# Patient Record
Sex: Female | Born: 1940 | Race: Black or African American | Hispanic: No | Marital: Married | Smoking: Never smoker
Health system: Southern US, Community
[De-identification: ages and names within clinical notes are randomized; demographics above are authoritative.]

## PROBLEM LIST (undated history)

## (undated) DIAGNOSIS — C259 Malignant neoplasm of pancreas, unspecified: Principal | ICD-10-CM

## (undated) DIAGNOSIS — K831 Obstruction of bile duct: Principal | ICD-10-CM

## (undated) DIAGNOSIS — C250 Malignant neoplasm of head of pancreas: Principal | ICD-10-CM

## (undated) DIAGNOSIS — Z01812 Encounter for preprocedural laboratory examination: Secondary | ICD-10-CM

## (undated) DIAGNOSIS — E119 Type 2 diabetes mellitus without complications: Secondary | ICD-10-CM

## (undated) DIAGNOSIS — T7840XA Allergy, unspecified, initial encounter: Secondary | ICD-10-CM

## (undated) DIAGNOSIS — H269 Unspecified cataract: Secondary | ICD-10-CM

## (undated) DIAGNOSIS — I10 Essential (primary) hypertension: Secondary | ICD-10-CM

## (undated) HISTORY — DX: Unspecified cataract: H26.9

## (undated) HISTORY — DX: Allergy, unspecified, initial encounter: T78.40XA

## (undated) HISTORY — DX: Type 2 diabetes mellitus without complications: E11.9

## (undated) HISTORY — PX: CHOLECYSTECTOMY: SHX55

## (undated) HISTORY — PX: SPINE SURGERY: SHX786

## (undated) HISTORY — DX: Essential (primary) hypertension: I10

## (undated) HISTORY — PX: ABDOMINAL HYSTERECTOMY: SHX81

---

## 1998-07-24 ENCOUNTER — Inpatient Hospital Stay (HOSPITAL_COMMUNITY): Admission: EM | Admit: 1998-07-24 | Discharge: 1998-07-26 | Payer: Self-pay | Admitting: Internal Medicine

## 1999-06-26 ENCOUNTER — Other Ambulatory Visit: Admission: RE | Admit: 1999-06-26 | Discharge: 1999-06-26 | Payer: Self-pay | Admitting: *Deleted

## 2001-07-03 ENCOUNTER — Encounter: Payer: Self-pay | Admitting: Internal Medicine

## 2001-07-03 ENCOUNTER — Encounter: Admission: RE | Admit: 2001-07-03 | Discharge: 2001-07-03 | Payer: Self-pay | Admitting: Internal Medicine

## 2001-12-07 ENCOUNTER — Other Ambulatory Visit: Admission: RE | Admit: 2001-12-07 | Discharge: 2001-12-07 | Payer: Self-pay | Admitting: *Deleted

## 2001-12-15 ENCOUNTER — Encounter: Payer: Self-pay | Admitting: *Deleted

## 2001-12-15 ENCOUNTER — Encounter: Admission: RE | Admit: 2001-12-15 | Discharge: 2001-12-15 | Payer: Self-pay | Admitting: *Deleted

## 2002-01-09 ENCOUNTER — Encounter: Payer: Self-pay | Admitting: *Deleted

## 2002-01-09 ENCOUNTER — Encounter: Admission: RE | Admit: 2002-01-09 | Discharge: 2002-01-09 | Payer: Self-pay | Admitting: *Deleted

## 2002-12-24 ENCOUNTER — Other Ambulatory Visit: Admission: RE | Admit: 2002-12-24 | Discharge: 2002-12-24 | Payer: Self-pay | Admitting: *Deleted

## 2003-01-15 ENCOUNTER — Encounter: Payer: Self-pay | Admitting: Internal Medicine

## 2003-01-15 ENCOUNTER — Encounter: Admission: RE | Admit: 2003-01-15 | Discharge: 2003-01-15 | Payer: Self-pay | Admitting: Internal Medicine

## 2004-05-08 IMAGING — CR DG CHEST 2V
2 series · 2 of 2 positions shown · non-contrast
Comparison: none

CLINICAL DATA: Lumbar disease, preop

[view not recorded (1 of 2)]
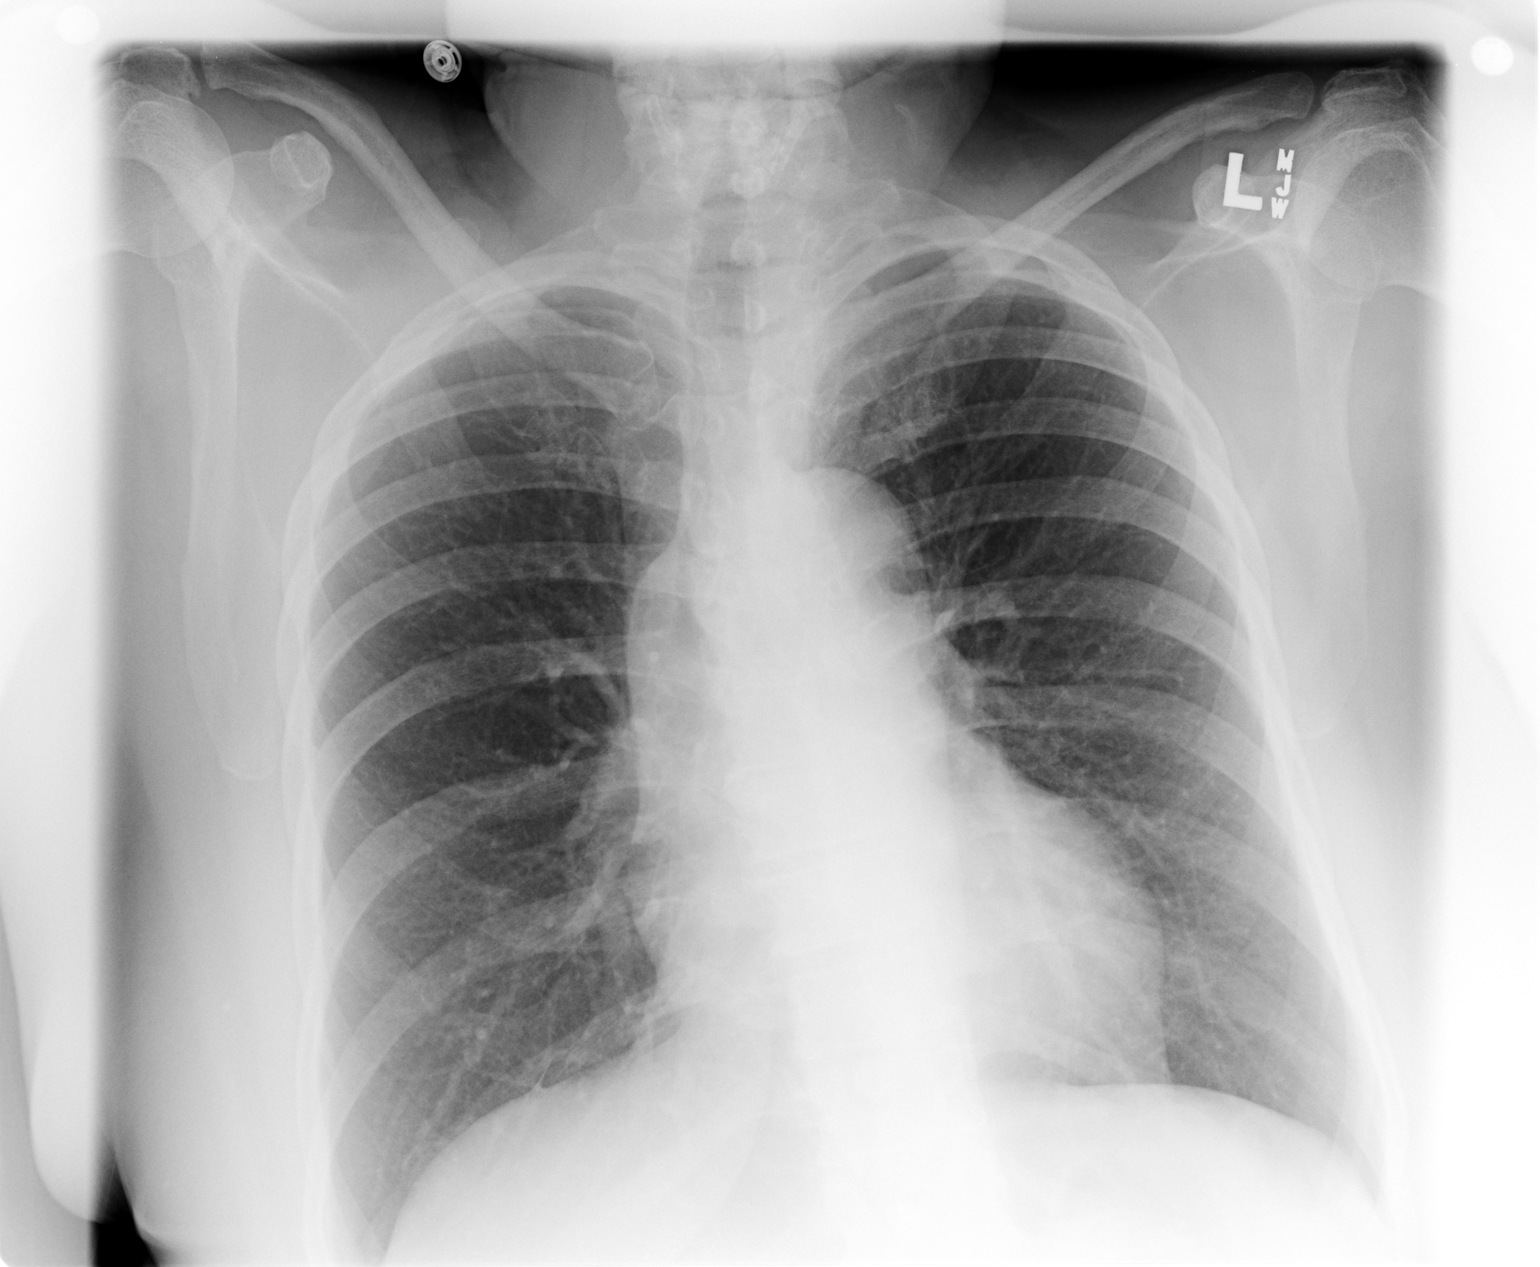

[view not recorded (2 of 2)]
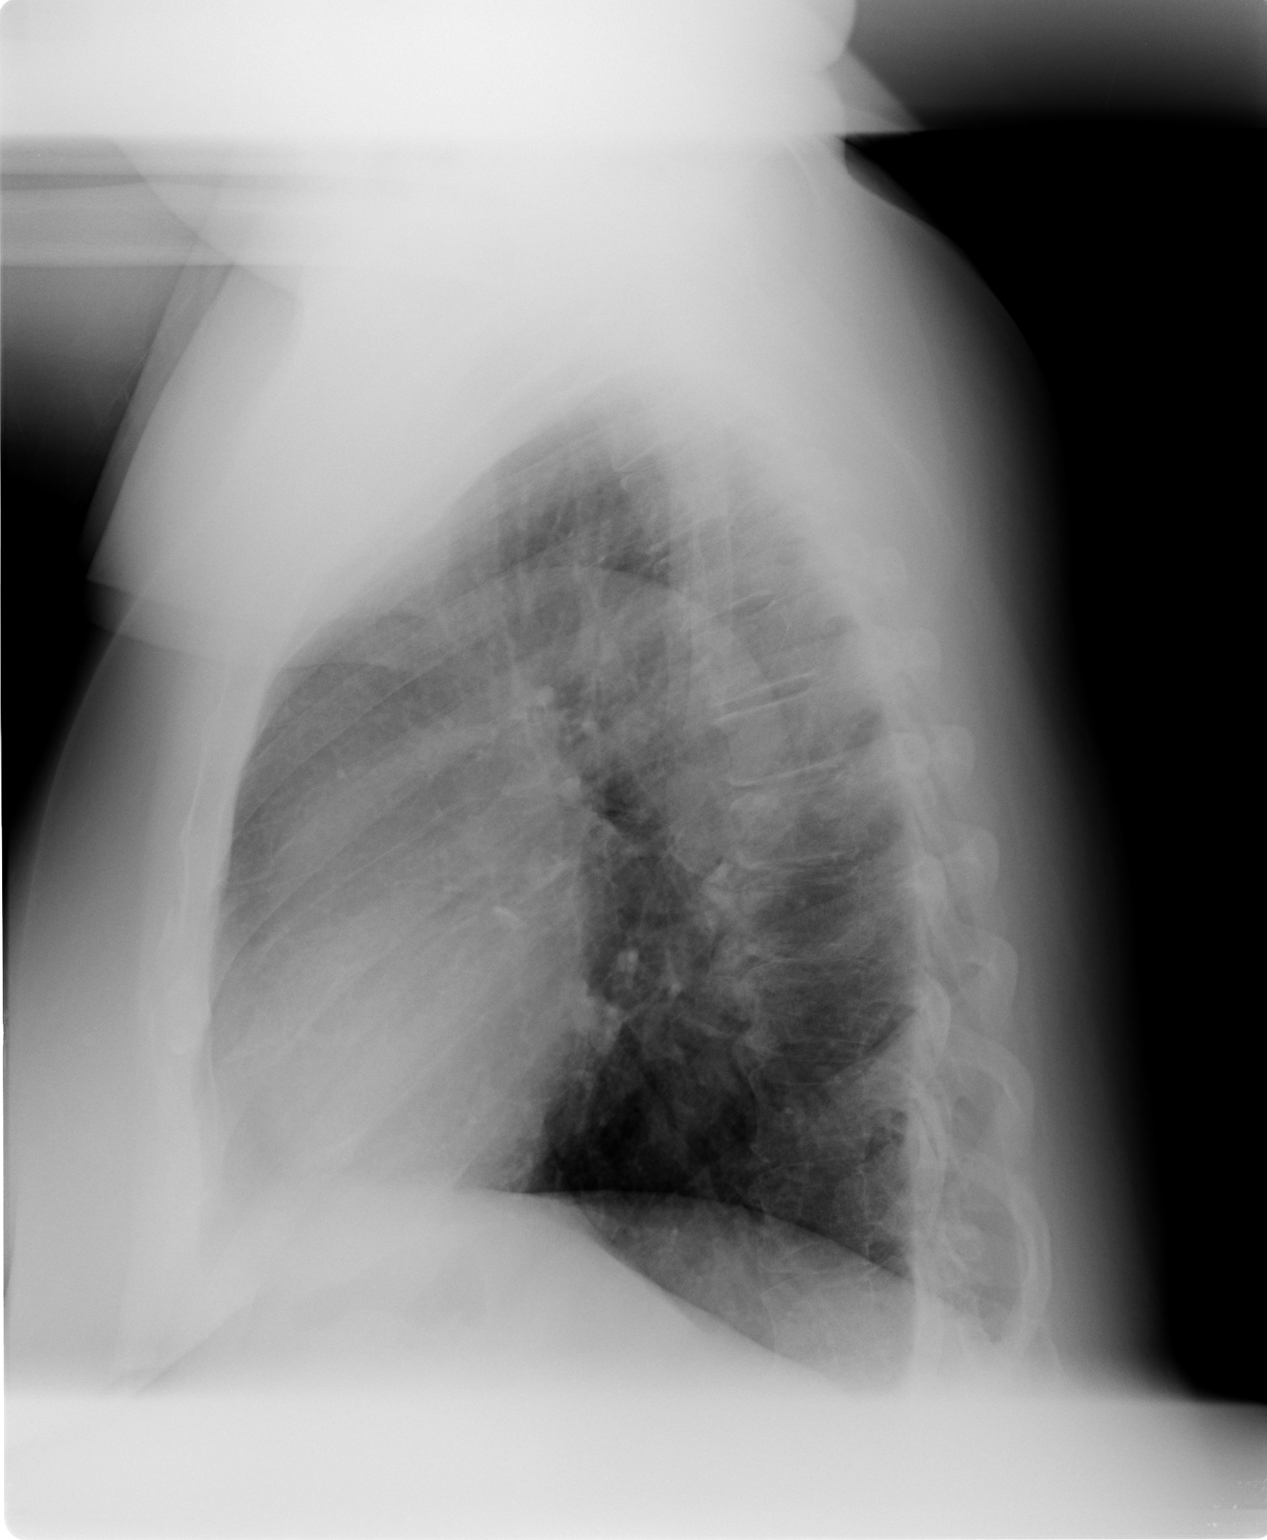

[2 of 2 positions shown; findings below may reference images not displayed]

Chest 2 view:

No previous available for comparison. Lungs are well inflated with somewhat
coarse bronchovascular markings. No confluent airspace infiltrate or overt
interstitial edema. Heart size is normal. Mildly tortuous thoracic aorta. No
effusion. Minimal degenerative spurring in the lower thoracic spine.
IMPRESSION: 1. No acute disease

## 2004-07-07 ENCOUNTER — Encounter: Admission: RE | Admit: 2004-07-07 | Discharge: 2004-07-07 | Payer: Self-pay | Admitting: Internal Medicine

## 2005-03-16 ENCOUNTER — Encounter: Admission: RE | Admit: 2005-03-16 | Discharge: 2005-04-01 | Payer: Self-pay | Admitting: Family Medicine

## 2005-04-05 ENCOUNTER — Encounter: Admission: RE | Admit: 2005-04-05 | Discharge: 2005-04-05 | Payer: Self-pay | Admitting: Family Medicine

## 2005-04-13 ENCOUNTER — Emergency Department (HOSPITAL_COMMUNITY): Admission: EM | Admit: 2005-04-13 | Discharge: 2005-04-13 | Payer: Self-pay | Admitting: Emergency Medicine

## 2005-04-14 ENCOUNTER — Ambulatory Visit (HOSPITAL_COMMUNITY): Admission: RE | Admit: 2005-04-14 | Discharge: 2005-04-15 | Payer: Self-pay | Admitting: Orthopaedic Surgery

## 2005-07-08 ENCOUNTER — Encounter: Admission: RE | Admit: 2005-07-08 | Discharge: 2005-07-08 | Payer: Self-pay | Admitting: Internal Medicine

## 2006-02-09 ENCOUNTER — Encounter: Payer: Self-pay | Admitting: Cardiology

## 2006-07-13 ENCOUNTER — Encounter: Admission: RE | Admit: 2006-07-13 | Discharge: 2006-07-13 | Payer: Self-pay | Admitting: Internal Medicine

## 2007-07-18 ENCOUNTER — Encounter: Admission: RE | Admit: 2007-07-18 | Discharge: 2007-07-18 | Payer: Self-pay | Admitting: Internal Medicine

## 2008-07-18 ENCOUNTER — Encounter: Admission: RE | Admit: 2008-07-18 | Discharge: 2008-07-18 | Payer: Self-pay | Admitting: Internal Medicine

## 2009-07-21 ENCOUNTER — Encounter: Admission: RE | Admit: 2009-07-21 | Discharge: 2009-07-21 | Payer: Self-pay | Admitting: Internal Medicine

## 2010-07-22 ENCOUNTER — Encounter: Admission: RE | Admit: 2010-07-22 | Discharge: 2010-07-22 | Payer: Self-pay | Admitting: Internal Medicine

## 2010-11-01 ENCOUNTER — Encounter: Payer: Self-pay | Admitting: Family Medicine

## 2011-02-26 NOTE — Op Note (Signed)
NAME:  Sarah Davies, Sarah Davies             ACCOUNT NO.:  [id number]   MEDICAL RECORD NO.:  [id number]          PATIENT TYPE:  OIB   LOCATION:  5020                         FACILITY:  [facility]   PHYSICIAN:  Mark C. Ophelia Charter, M.D.    DATE OF BIRTH:  07-15-1941   DATE OF PROCEDURE:  04/14/2005  DATE OF DISCHARGE:                                 OPERATIVE REPORT   PREOPERATIVE DIAGNOSIS:  Left L4-L5 herniated nucleus pulposus.   POSTOPERATIVE DIAGNOSIS:  Left L4-L5 herniated nucleus pulposus.   PROCEDURE:  Left L4-L5 microdiscectomy.   SURGEON:  Mark C. Ophelia Charter, M.D.   ANESTHESIA:  GOT.   ESTIMATED BLOOD LOSS:  Minimal.   BRIEF HISTORY:  70 year old female has had one epidural steroid for a large  HNP with moderate stenosis and large fragment causing nerve root compression  with leg weakness and progression to the point where the patient is not able  to ambulate and has to lie prone for relief.  She spent five hours in the  emergency room and has been taking Dilaudid.   DESCRIPTION OF PROCEDURE:  After induction of general anesthesia, the  patient was placed in a kneeling position, the back was prepped, area  squared with towels, Betadine and Vi-Drape applied.  A cross table lateral x-  ray confirmed the appropriate level.  An incision was made just left of the  midline down to the interspace.  There was slight anterior listhesis  degenerative in nature and the space between the two lamina at L4 and L5 on  the left side was tight.  Initially, the inferior aspect of L4 was removed  performing a laminotomy and then following the ligament out laterally and  then down the lateral gutter and then taking the top portion of L5.  After  performing this, the nerve root was visualized in the gutter.  This was  placed dorsally and was at about the mid position of the dural sac instead  of being down on the floor as it exited out below the pedicle.  There was  scar tissue making it adherent and gradually it  was released with  microdissection with a draped microscope.  Once the nerve root was  mobilized, there was an immediate large fragment noted that had migrated and  was free and caudad from the disc space.  This was grasped and removed in  several chunks.  Passes were made in the midline into the disc removing  chunks of disc and then further bone removal right below the pedicle until  it was free.  Jumping over the other side of the nerve root down in the  axilla with microdissection, there was epidural fat, some dilated epidural  veins, but no fragments present.  After irrigation and additional passes  into the disc space with micropituitaries, the retractor was removed.  The  nerve root was complete free, passes were made with the dural separator  anterior to the dura with 180 degree  sweep with no areas of compression.  The fascia was closed with 0 Vicryl, 2-  0 Vicryl in the subcutaneous tissue, skin staple closure.  Postop dressing  and Marcaine infiltration in the skin prior to dressing and transfer to the  recovery room in stable condition.       MCY/MEDQ  D:  04/14/2005  T:  04/14/2005  Job:  [id number]

## 2011-07-12 ENCOUNTER — Other Ambulatory Visit: Payer: Self-pay | Admitting: Internal Medicine

## 2011-07-12 DIAGNOSIS — Z1231 Encounter for screening mammogram for malignant neoplasm of breast: Secondary | ICD-10-CM

## 2011-07-27 ENCOUNTER — Ambulatory Visit
Admission: RE | Admit: 2011-07-27 | Discharge: 2011-07-27 | Disposition: A | Discharge status: Home / Self Care | Payer: BC Managed Care – PPO | Source: Ambulatory Visit | Attending: Internal Medicine | Admitting: Internal Medicine

## 2011-07-27 DIAGNOSIS — Z1231 Encounter for screening mammogram for malignant neoplasm of breast: Secondary | ICD-10-CM

## 2012-06-27 ENCOUNTER — Other Ambulatory Visit: Payer: Self-pay | Admitting: Internal Medicine

## 2012-06-27 DIAGNOSIS — Z1231 Encounter for screening mammogram for malignant neoplasm of breast: Secondary | ICD-10-CM

## 2012-07-27 ENCOUNTER — Ambulatory Visit
Admission: RE | Admit: 2012-07-27 | Discharge: 2012-07-27 | Disposition: A | Discharge status: Home / Self Care | Payer: Medicare Other | Source: Ambulatory Visit | Attending: Internal Medicine | Admitting: Internal Medicine

## 2012-07-27 DIAGNOSIS — Z1231 Encounter for screening mammogram for malignant neoplasm of breast: Secondary | ICD-10-CM

## 2013-04-18 ENCOUNTER — Encounter: Payer: Self-pay | Admitting: Podiatry

## 2013-04-18 ENCOUNTER — Ambulatory Visit (INDEPENDENT_AMBULATORY_CARE_PROVIDER_SITE_OTHER): Payer: 59 | Admitting: Podiatry

## 2013-04-18 DIAGNOSIS — B351 Tinea unguium: Secondary | ICD-10-CM | POA: Insufficient documentation

## 2013-04-18 DIAGNOSIS — M25579 Pain in unspecified ankle and joints of unspecified foot: Secondary | ICD-10-CM

## 2013-04-18 NOTE — Progress Notes (Signed)
Subjective: 72 y.o. year old female patient presents complaining of painful nails. Patient requests toe nails and calluses trimmed. She has very thick and problematic nails that are difficult to get trimmed. Patient is diabetic with a statement saying "just a little bit elevated blood sugar."  Stated that last Monday her blood sugar reading was 140 and HgA1c was 7 in March 20, 2013.  Patient was referred by Dr.Kimberly Renae Gloss.   Patient Summary List & History reviewed for allergies, medications, medical problems and surgical history.  Review of Systems - General ROS: negative for - chills, fatigue, fever, hot flashes, malaise, night sweats, sleep disturbance, weight gain or weight loss Ophthalmic ROS: Beginning of Cataract and no need for surgery at this point. Wears corrective lenses. ENT ROS: negative Allergy and Immunology ROS: Allergic to mold, shell fish, animal dander, and certain type of grass. Endocrine ROS: negative Breast ROS: negative for breast lumps Respiratory ROS: no cough, shortness of breath, or wheezing Cardiovascular ROS: no chest pain or dyspnea on exertion Gastrointestinal ROS: no abdominal pain, change in bowel habits, or black or bloody stools Musculoskeletal ROS: Some arthritic problems in back, hands and knee left. 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 Dermatological ROS: negative  Objective: Dermatologic: Thick dystrophic and hypertrophic nails on left great toe and mild hypertrophic nails x 10.  Skin: Plantar broad calluses under 5th MPJ R>L.  No acute skin lesions noted. Vascular: All pedal pulses are palpable.  No edema or erythema noted. Orthopedic:  High Arch cavus type foot.  Right ankle with tight Achilles tendon. Contracted lesser digits bilateral. Neurologic:  Sometimes numbness or tingling on feet.  All epicritic and tactile sensations grossly intact. Positive response to Monofilament sensory testing  bilateral. Vibratory and DTR testing is normal on both lower limbs.  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  Treatment: All mycotic nails and calluses debrided.  Discussed diabetic foot care and proper shoe selection.  Advised to do stretch exercise for tight Achilles tendon on right ankle.  Return in 3 months or sooner as needed.

## 2013-05-16 ENCOUNTER — Other Ambulatory Visit: Payer: Self-pay

## 2013-07-20 ENCOUNTER — Encounter: Payer: Self-pay | Admitting: Podiatry

## 2013-07-20 ENCOUNTER — Ambulatory Visit (INDEPENDENT_AMBULATORY_CARE_PROVIDER_SITE_OTHER): Payer: 59 | Admitting: Podiatry

## 2013-07-20 VITALS — BP 164/82 | HR 57 | Ht 67.0 in | Wt 240.0 lb

## 2013-07-20 DIAGNOSIS — B351 Tinea unguium: Secondary | ICD-10-CM

## 2013-07-20 DIAGNOSIS — M25579 Pain in unspecified ankle and joints of unspecified foot: Secondary | ICD-10-CM

## 2013-07-20 NOTE — Patient Instructions (Signed)
Seen for painful ingrown nails both great toe nails. Debrided offending borders. Return as needed or in 3 months.

## 2013-07-20 NOTE — Progress Notes (Signed)
Subjective: 72 year old female presents with painful toe nails on both great toes.  Objective: Dermatologic:  Thick dystrophic and hypertrophic nails on both great toes and mild hypertrophic nails x 10.  No acute skin lesions noted.  Vascular:  All pedal pulses are palpable.  No edema or erythema noted.  Orthopedic:  High Arch cavus type foot.  Right ankle with tight Achilles tendon.  Contracted lesser digits bilateral.  Neurologic:  All epicritic and tactile sensations grossly intact.  Positive response to Monofilament sensory testing bilateral. Vibratory and DTR testing is normal on both lower limbs.   Assessment:  Dystrophic mycotic nails x 10.  Ingrown nails 1 bilateral.  Treatment: All mycotic nails debrided.  Return in 3 months or sooner as needed.

## 2013-07-23 ENCOUNTER — Other Ambulatory Visit: Payer: Self-pay

## 2013-07-23 DIAGNOSIS — Z1231 Encounter for screening mammogram for malignant neoplasm of breast: Secondary | ICD-10-CM

## 2013-08-16 ENCOUNTER — Other Ambulatory Visit: Payer: Self-pay

## 2013-08-17 ENCOUNTER — Ambulatory Visit
Admission: RE | Admit: 2013-08-17 | Discharge: 2013-08-17 | Disposition: A | Discharge status: Home / Self Care | Payer: Medicare Other | Source: Ambulatory Visit

## 2013-08-17 DIAGNOSIS — Z1231 Encounter for screening mammogram for malignant neoplasm of breast: Secondary | ICD-10-CM

## 2013-10-19 ENCOUNTER — Ambulatory Visit: Payer: 59 | Admitting: Podiatry

## 2014-02-04 ENCOUNTER — Ambulatory Visit (INDEPENDENT_AMBULATORY_CARE_PROVIDER_SITE_OTHER): Payer: 59 | Admitting: Family Medicine

## 2014-02-04 VITALS — BP 142/80 | HR 62 | Temp 98.5°F | Resp 17 | Ht 66.0 in | Wt 251.0 lb

## 2014-02-04 DIAGNOSIS — E119 Type 2 diabetes mellitus without complications: Secondary | ICD-10-CM

## 2014-02-04 DIAGNOSIS — L0291 Cutaneous abscess, unspecified: Secondary | ICD-10-CM

## 2014-02-04 DIAGNOSIS — L0390 Cellulitis, unspecified: Principal | ICD-10-CM

## 2014-02-04 LAB — POCT CBC
Granulocyte percent: 56.7 % (ref 37–80)
HCT, POC: 42.7 % (ref 37.7–47.9)
Hemoglobin: 13.1 g/dL (ref 12.2–16.2)
Lymph, poc: 2.4 (ref 0.6–3.4)
MCH, POC: 27.4 pg (ref 27–31.2)
MCHC: 30.7 g/dL — AB (ref 31.8–35.4)
MCV: 89.3 fL (ref 80–97)
MID (cbc): 0.6 (ref 0–0.9)
MPV: 9 fL (ref 0–99.8)
POC Granulocyte: 3.9 (ref 2–6.9)
POC LYMPH PERCENT: 35 % (ref 10–50)
POC MID %: 8.3 % (ref 0–12)
Platelet Count, POC: 204 10*3/uL (ref 142–424)
RBC: 4.78 M/uL (ref 4.04–5.48)
RDW, POC: 14.2 %
WBC: 6.8 10*3/uL (ref 4.6–10.2)

## 2014-02-04 LAB — GLUCOSE, POCT (MANUAL RESULT ENTRY): POC Glucose: 126 mg/dl — AB (ref 70–99)

## 2014-02-04 MED ORDER — DOXYCYCLINE HYCLATE 100 MG PO TABS: 100.0000 mg | ORAL_TABLET | Freq: Two times a day (BID) | ORAL | Status: DC

## 2014-02-04 NOTE — Patient Instructions (Signed)
Cellulitis  Cellulitis is an infection of the skin and the tissue beneath it. The infected area is usually red and tender. Cellulitis occurs most often in the arms and lower legs.   CAUSES   Cellulitis is caused by bacteria that enter the skin through cracks or cuts in the skin. The most common types of bacteria that cause cellulitis are Staphylococcus and Streptococcus.  SYMPTOMS    Redness and warmth.   Swelling.   Tenderness or pain.   Fever.  DIAGNOSIS   Your caregiver can usually determine what is wrong based on a physical exam. Blood tests may also be done.  TREATMENT   Treatment usually involves taking an antibiotic medicine.  HOME CARE INSTRUCTIONS    Take your antibiotics as directed. Finish them even if you start to feel better.   Keep the infected arm or leg elevated to reduce swelling.   Apply a warm cloth to the affected area up to 4 times per day to relieve pain.   Only take over-the-counter or prescription medicines for pain, discomfort, or fever as directed by your caregiver.   Keep all follow-up appointments as directed by your caregiver.  SEEK MEDICAL CARE IF:    You notice red streaks coming from the infected area.   Your red area gets larger or turns dark in color.   Your bone or joint underneath the infected area becomes painful after the skin has healed.   Your infection returns in the same area or another area.   You notice a swollen bump in the infected area.   You develop new symptoms.  SEEK IMMEDIATE MEDICAL CARE IF:    You have a fever.   You feel very sleepy.   You develop vomiting or diarrhea.   You have a general ill feeling (malaise) with muscle aches and pains.  MAKE SURE YOU:    Understand these instructions.   Will watch your condition.   Will get help right away if you are not doing well or get worse.  Document Released: 07/07/2005 Document Revised: 03/28/2012 Document Reviewed: 12/13/2011  [employer] Patient Information 2014 [employer]  Abscess  An  abscess is an infected area that contains a collection of pus and debris.It can occur in almost any part of the body. An abscess is also known as a furuncle or boil.  CAUSES   An abscess occurs when tissue gets infected. This can occur from blockage of oil or sweat glands, infection of hair follicles, or a minor injury to the skin. As the body tries to fight the infection, pus collects in the area and creates pressure under the skin. This pressure causes pain. People with weakened immune systems have difficulty fighting infections and get certain abscesses more often.   SYMPTOMS  Usually an abscess develops on the skin and becomes a painful mass that is red, warm, and tender. If the abscess forms under the skin, you may feel a moveable soft area under the skin. Some abscesses break open (rupture) on their own, but most will continue to get worse without care. The infection can spread deeper into the body and eventually into the bloodstream, causing you to feel ill.   DIAGNOSIS   Your caregiver will take your medical history and perform a physical exam. A sample of fluid may also be taken from the abscess to determine what is causing your infection.  TREATMENT   Your caregiver may prescribe antibiotic medicines to fight the infection. However, taking antibiotics alone usually does   not cure an abscess. Your caregiver may need to make a small cut (incision) in the abscess to drain the pus. In some cases, gauze is packed into the abscess to reduce pain and to continue draining the area.  HOME CARE INSTRUCTIONS    Only take over-the-counter or prescription medicines for pain, discomfort, or fever as directed by your caregiver.   If you were prescribed antibiotics, take them as directed. Finish them even if you start to feel better.   If gauze is used, follow your caregiver's directions for changing the gauze.   To avoid spreading the infection:   Keep your draining abscess covered with a bandage.   Wash your hands  well.   Do not share personal care items, towels, or whirlpools with others.   Avoid skin contact with others.   Keep your skin and clothes clean around the abscess.   Keep all follow-up appointments as directed by your caregiver.  SEEK MEDICAL CARE IF:    You have increased pain, swelling, redness, fluid drainage, or bleeding.   You have muscle aches, chills, or a general ill feeling.   You have a fever.  MAKE SURE YOU:    Understand these instructions.   Will watch your condition.   Will get help right away if you are not doing well or get worse.  Document Released: 07/07/2005 Document Revised: 03/28/2012 Document Reviewed: 12/10/2011  [employer] Patient Information 2014 [employer]

## 2014-02-04 NOTE — Progress Notes (Signed)
Chief Complaint:  Chief Complaint  Patient presents with  . skin infection around breast    per patient     HPI: Sarah Davies is a 73 y.o. female who is here for skin infection on her right breast, she has had it for 7 days. She has had pimiple there int he past. SHe has ahd a cyst similar to this removed from her back int he past. Denies fevers, but has had chills. Has not tried anythign for this. She is not on any blood thinners, she has a history of HTN. No prior skin infections. She has diabetes but diet controlled.Last A1c was April 1st and was 7 Usually diet controlled and is 6.5   Past Medical History  Diagnosis Date  . Diabetes mellitus without complication   . Allergy   . Cataract   . Hypertension    Past Surgical History  Procedure Laterality Date  . Cholecystectomy    . Abdominal hysterectomy    . Spine surgery     History   Social History  . Marital Status: Married    Spouse Name: N/A    Number of Children: N/A  . Years of Education: N/A   Social History Main Topics  . Smoking status: Never Smoker   . Smokeless tobacco: Never Used  . Alcohol Use: No  . Drug Use: No  . Sexual Activity: No   Other Topics Concern  . None   Social History Narrative  . None   History reviewed. No pertinent family history. Allergies  Allergen Reactions  . Shellfish Allergy    Prior to Admission medications   Medication Sig Start Date End Date Taking? Authorizing Provider  b complex vitamins tablet Take 1 tablet by mouth daily.   Yes Historical Provider, MD  calcium carbonate (OS-CAL) 600 MG TABS Take 600 mg by mouth 2 (two) times daily with a meal.   Yes Historical Provider, MD  carvedilol (COREG) 12.5 MG tablet Take 12.5 mg by mouth 2 (two) times daily with a meal.  03/21/13  Yes Historical Provider, MD  cholecalciferol (VITAMIN D) 1000 UNITS tablet Take 1,000 Units by mouth daily.   Yes Historical Provider, MD  co-enzyme Q-10 50 MG capsule Take 50 mg by  mouth daily.   Yes Historical Provider, MD  doxazosin (CARDURA) 4 MG tablet Take 4 mg by mouth at bedtime.  01/19/13  Yes Historical Provider, MD  furosemide (LASIX) 20 MG tablet Take 20 mg by mouth daily.  03/16/13  Yes Historical Provider, MD  lisinopril (PRINIVIL,ZESTRIL) 10 MG tablet Take 10 mg by mouth 2 (two) times daily.  03/02/13  Yes Historical Provider, MD  magnesium 30 MG tablet Take 30 mg by mouth daily.   Yes Historical Provider, MD     ROS: The patient denies fevers, night sweats, unintentional weight loss, chest pain, palpitations, wheezing, dyspnea on exertion, nausea, vomiting, abdominal pain, dysuria, hematuria, melena, numbness, weakness, or tingling.   All other systems have been reviewed and were otherwise negative with the exception of those mentioned in the HPI and as above.    PHYSICAL EXAM: Filed Vitals:   02/04/14 1403  BP: 142/80  Pulse: 62  Temp: 98.5 F (36.9 C)  Resp: 17   Filed Vitals:   02/04/14 1403  Height: 5\' 6"  (1.676 m)  Weight: 251 lb (113.853 kg)   Body mass index is 40.53 kg/(m^2).  General: Alert, no acute distress HEENT:  Normocephalic, atraumatic, oropharynx patent. EOMI, PERRLA  Cardiovascular:  Regular rate and rhythm, no rubs murmurs or gallops.  No Carotid bruits, radial pulse intact. No pedal edema.  Respiratory: Clear to auscultation bilaterally.  No wheezes, rales, or rhonchi.  No cyanosis, no use of accessory musculature GI: No organomegaly, abdomen is soft and non-tender, positive bowel sounds.  No masses. Skin: + abscess, cellulitis right mid sternum Neurologic: Facial musculature symmetric. Psychiatric: Patient is appropriate throughout our interaction. Lymphatic: No cervical lymphadenopathy Musculoskeletal: Gait intact. BReast exam normal  LABS: Results for orders placed in visit on 02/04/14  POCT CBC      Result Value Ref Range   WBC 6.8  4.6 - 10.2 K/uL   Lymph, poc 2.4  0.6 - 3.4   POC LYMPH PERCENT 35.0  10 - 50 %L    MID (cbc) 0.6  0 - 0.9   POC MID % 8.3  0 - 12 %M   POC Granulocyte 3.9  2 - 6.9   Granulocyte percent 56.7  37 - 80 %G   RBC 4.78  4.04 - 5.48 M/uL   Hemoglobin 13.1  12.2 - 16.2 g/dL   HCT, POC 42.7  37.7 - 47.9 %   MCV 89.3  80 - 97 fL   MCH, POC 27.4  27 - 31.2 pg   MCHC 30.7 (*) 31.8 - 35.4 g/dL   RDW, POC 14.2     Platelet Count, POC 204  142 - 424 K/uL   MPV 9.0  0 - 99.8 fL  GLUCOSE, POCT (MANUAL RESULT ENTRY)      Result Value Ref Range   POC Glucose 126 (*) 70 - 99 mg/dl     EKG/XRAY:   Primary read interpreted by Dr. Marin Comment at [facility].   ASSESSMENT/PLAN: Encounter Diagnoses  Name Primary?  . Cellulitis and abscess Yes  . Diabetes    RX Doxycyline Wound cx pending F/u prn or in 48 hrs Skin marked with pen, precautions given  Gross sideeffects, risk and benefits, and alternatives of medications d/w patient. Patient is aware that all medications have potential sideeffects and we are unable to predict every sideeffect or drug-drug interaction that may occur.  Glenford Bayley, DO 02/04/2014 3:15 PM

## 2014-02-04 NOTE — Progress Notes (Signed)
Patient ID: Sarah Davies MRN: [id number], DOB: 10/09/1941, 74 y.o. Date of Encounter: 02/04/2014, 3:29 PM    PROCEDURE NOTE: Verbal consent obtained. Local anesthesia obtained with 2% lidocaine plain, about 1.5 cc's  0.5 cm incision made with 11 blade along lesion.  Culture taken. Moderate purulence expressed. Lesion explored revealing no loculations. Packed with 1/4 plain packing.  Dressed. Wound care instructions including precautions with patient. Patient tolerated the procedure well. Recheck in 48 hours      Signed, Georgiann Mccoy, PA-C 02/04/2014 3:29 PM

## 2014-02-07 ENCOUNTER — Ambulatory Visit (INDEPENDENT_AMBULATORY_CARE_PROVIDER_SITE_OTHER): Payer: 59 | Admitting: Physician Assistant

## 2014-02-07 VITALS — BP 136/78 | HR 56 | Temp 98.2°F | Resp 18 | Ht 66.0 in | Wt 254.4 lb

## 2014-02-07 DIAGNOSIS — L0390 Cellulitis, unspecified: Principal | ICD-10-CM

## 2014-02-07 DIAGNOSIS — L0291 Cutaneous abscess, unspecified: Secondary | ICD-10-CM

## 2014-02-07 LAB — WOUND CULTURE
Gram Stain: NONE SEEN
Gram Stain: NONE SEEN
Gram Stain: NONE SEEN

## 2014-02-07 NOTE — Progress Notes (Signed)
   Subjective:    Patient ID: Sarah Davies, female    DOB: October 02, 1941, 73 y.o.   MRN: [id number]  Wound Check     Sarah Davies is a very pleasant 73 yr old female here for wound care following I&D of a right breast abscess.  She reports that she is doing well.  Still with some tenderness but improving.  She is taking the antibiotic as directed and tolerating it well.  She has been doing hot compresses.  She has not been changing the dressing    Review of Systems  Constitutional: Negative for fever and chills.  Respiratory: Negative.   Cardiovascular: Negative.   Gastrointestinal: Negative.   Skin: Positive for wound.       Objective:   Physical Exam  Vitals reviewed. Constitutional: She is oriented to person, place, and time. She appears well-developed and well-nourished. No distress.  HENT:  Head: Normocephalic and atraumatic.  Eyes: Conjunctivae are normal. No scleral icterus.  Pulmonary/Chest: Effort normal.  Neurological: She is alert and oriented to person, place, and time.  Skin: Skin is warm and dry.     Healing abscess RIGHT breast; mild surrounding erythema  Psychiatric: She has a normal mood and affect. Her behavior is normal.    Wound Care: Dressing and packing removed.  Spontaneous drainage of thick purulent material with removal of packing.  Copious purulence drained with manipulation.  Irrigated with 5cc 2% plain lidocaine.  Not repacked.  Dressing applied.      Assessment & Plan:  Cellulitis and abscess   Sarah Davies is a pleasant 73 yr old female here for wound care following I&D of a right breast abscess.  Copious thick purulent material drained with removal of the packing.  Due to this, I have not repacked today.  Hopefully not having the packing will facilitate better drainage.  Encouraged pt to apply frequent hot compresses over the next few days.  Daily and prn dressing changes.  Continue abx.  Recheck 48 hours - fast track card updated    E.  Natividad Brood M[facility], PA-C [facility] 4/30/20155:01 PM

## 2014-02-09 ENCOUNTER — Ambulatory Visit (INDEPENDENT_AMBULATORY_CARE_PROVIDER_SITE_OTHER): Payer: Medicare Other | Admitting: Physician Assistant

## 2014-02-09 VITALS — BP 134/75 | HR 58 | Temp 98.6°F | Resp 18 | Wt 253.0 lb

## 2014-02-09 DIAGNOSIS — L0390 Cellulitis, unspecified: Principal | ICD-10-CM

## 2014-02-09 DIAGNOSIS — L0291 Cutaneous abscess, unspecified: Secondary | ICD-10-CM

## 2014-02-09 NOTE — Progress Notes (Signed)
   Subjective:    Patient ID: Sarah Davies, female    DOB: [birth year], 73 y.o.   MRN: [id number]  HPI  Pt presents to clinic for a wound recheck.  The wound has been draining slightly and overall it feels better with less pain and irritation.  She has been tolerating the abx ok.  Review of Systems  Constitutional: Negative for fever and chills.  Skin: Positive for wound.       Objective:   Physical Exam  Vitals reviewed. Constitutional: She is oriented to person, place, and time. She appears well-developed and well-nourished.  HENT:  Head: Normocephalic and atraumatic.  Right Ear: External ear normal.  Left Ear: External ear normal.  Pulmonary/Chest: Effort normal.  Neurological: She is alert and oriented to person, place, and time.  Skin:     Psychiatric: She has a normal mood and affect. Her behavior is normal. Judgment and thought content normal.       Assessment & Plan:  Cellulitis and abscess -  Pt's wound is not draining well at this point but pt is not interested in having the wound re incised. Due to her significant improvement with pain she would like to continue warm compresses and add gentle friction to the incision to decrease healing and scab formation.  She will gentle palpate the area to express fluid that is collecting in the wound cavity.  She will continue her abx.  We discussed the concern about return of infection without reopening the incision but she would like to try this for the next 24h - if the area gets worse she will RTC tomorrow for repeat I&D and if it is not worsening she will RTC in 48h for a recheck.  Windell Hummingbird PA-C  [facility] 02/09/2014 9:20 AM

## 2014-02-11 ENCOUNTER — Ambulatory Visit: Payer: Medicare Other

## 2014-02-11 ENCOUNTER — Ambulatory Visit (INDEPENDENT_AMBULATORY_CARE_PROVIDER_SITE_OTHER): Payer: Medicare Other | Admitting: Physician Assistant

## 2014-02-11 VITALS — BP 132/80 | HR 72 | Temp 98.4°F | Resp 18 | Ht 66.0 in | Wt 253.0 lb

## 2014-02-11 DIAGNOSIS — L0390 Cellulitis, unspecified: Principal | ICD-10-CM

## 2014-02-11 DIAGNOSIS — L0291 Cutaneous abscess, unspecified: Secondary | ICD-10-CM

## 2014-02-11 NOTE — Progress Notes (Signed)
   Subjective:    Patient ID: Sarah Davies, female    DOB: 06/08/1941, 73 y.o.   MRN: [id number]  Wound Check     Sarah Davies is a pleasant 73 yr old female here for wound care following I&D of a RIGHT breast abscess on 02/04/14.   She was seen here 02/09/14 - "Pt's wound is not draining well at this point but pt is not interested in having the wound re incised. Due to her significant improvement with pain she would like to continue warm compresses and add gentle friction to the incision to decrease healing and scab formation. She will gentle palpate the area to express fluid that is collecting in the wound cavity"  Pt has not removed the dressing since last wound care.  She reports she has applied warm compresses and pushed on the area but is unsure if anything has drained as she has not taken the bandage off.  She has not scrubbed/applied friction to the wound.  She is taking the antibiotic    Review of Systems  Constitutional: Negative for fever and chills.  Respiratory: Negative.   Cardiovascular: Negative.   Gastrointestinal: Negative.   Skin: Positive for wound.       Objective:   Physical Exam  Vitals reviewed. Constitutional: She is oriented to person, place, and time. She appears well-developed and well-nourished. No distress.  HENT:  Head: Normocephalic and atraumatic.  Pulmonary/Chest: Effort normal.  Neurological: She is alert and oriented to person, place, and time.  Skin: Skin is warm and dry.     Healing abscess at RIGHT breast; minimal erythema, no induration; the incision has completely scabbed over, there is fluctuance beneath the healed incision  Psychiatric: She has a normal mood and affect. Her behavior is normal.    Wound Care: Incision scabbed over.  There is crusted drainage around the incision.  Fluctuance beneath incision.  Scabbed gently lifted with 11 blade.  Moderate purulence expressed with pressure from the small opening that remains.  The area is  not tender.      Assessment & Plan:  Cellulitis and abscess   Sarah Davies is a 73 yr old female here for wound care following I&D of a RIGHT breast abscess.  Unfortunately she did not follow the instructions given to her at last visit.  The wound has scabbed over.  I was able to lift the the scab and express further purulence today.  Gave pt specific instructions to apply frequent hot compresses, scrub/irritate the wound to prevent scab formation, and express any further purulence.  Discussed with her that I do not want the incision to fully close until there is no further drainage.  We discussed the possibility of the abscess re-forming and the need for repeat I&D.  She expresses understanding and agrees with this plan.  She will finish the abx as directed.  I do not think she needs to return unless she is worsening or not improving.   Pt to call or RTC if worsening or not improving  E. Natividad Brood M[facility], PA-C [facility] 5/4/20159:11 AM

## 2014-07-10 ENCOUNTER — Other Ambulatory Visit: Payer: Self-pay

## 2014-07-10 DIAGNOSIS — Z1231 Encounter for screening mammogram for malignant neoplasm of breast: Secondary | ICD-10-CM

## 2014-07-26 ENCOUNTER — Other Ambulatory Visit: Payer: Self-pay

## 2014-08-19 ENCOUNTER — Ambulatory Visit
Admission: RE | Admit: 2014-08-19 | Discharge: 2014-08-19 | Disposition: A | Discharge status: Home / Self Care | Payer: 59 | Source: Ambulatory Visit

## 2014-08-19 DIAGNOSIS — Z1231 Encounter for screening mammogram for malignant neoplasm of breast: Secondary | ICD-10-CM

## 2015-04-07 ENCOUNTER — Other Ambulatory Visit: Payer: Self-pay

## 2015-07-11 ENCOUNTER — Other Ambulatory Visit: Payer: Self-pay

## 2015-07-11 DIAGNOSIS — Z1231 Encounter for screening mammogram for malignant neoplasm of breast: Secondary | ICD-10-CM

## 2015-08-22 ENCOUNTER — Ambulatory Visit
Admission: RE | Admit: 2015-08-22 | Discharge: 2015-08-22 | Disposition: A | Discharge status: Home / Self Care | Payer: Medicare Other | Source: Ambulatory Visit

## 2015-08-22 DIAGNOSIS — Z1231 Encounter for screening mammogram for malignant neoplasm of breast: Secondary | ICD-10-CM

## 2016-07-21 ENCOUNTER — Other Ambulatory Visit: Payer: Self-pay | Admitting: Family Medicine

## 2016-07-21 DIAGNOSIS — Z1231 Encounter for screening mammogram for malignant neoplasm of breast: Secondary | ICD-10-CM

## 2016-08-24 ENCOUNTER — Ambulatory Visit
Admission: RE | Admit: 2016-08-24 | Discharge: 2016-08-24 | Disposition: A | Discharge status: Home / Self Care | Payer: Medicare Other | Source: Ambulatory Visit | Attending: Family Medicine | Admitting: Family Medicine

## 2016-08-24 DIAGNOSIS — Z1231 Encounter for screening mammogram for malignant neoplasm of breast: Secondary | ICD-10-CM

## 2016-08-27 ENCOUNTER — Other Ambulatory Visit: Payer: Self-pay | Admitting: Family Medicine

## 2016-08-27 DIAGNOSIS — R928 Other abnormal and inconclusive findings on diagnostic imaging of breast: Secondary | ICD-10-CM

## 2016-09-07 ENCOUNTER — Ambulatory Visit
Admission: RE | Admit: 2016-09-07 | Discharge: 2016-09-07 | Disposition: A | Discharge status: Home / Self Care | Payer: Medicare Other | Source: Ambulatory Visit | Attending: Family Medicine | Admitting: Family Medicine

## 2016-09-07 DIAGNOSIS — R928 Other abnormal and inconclusive findings on diagnostic imaging of breast: Secondary | ICD-10-CM

## 2017-08-05 ENCOUNTER — Other Ambulatory Visit: Payer: Self-pay | Admitting: Family Medicine

## 2017-08-05 DIAGNOSIS — Z1231 Encounter for screening mammogram for malignant neoplasm of breast: Secondary | ICD-10-CM

## 2017-08-26 ENCOUNTER — Ambulatory Visit
Admission: RE | Admit: 2017-08-26 | Discharge: 2017-08-26 | Disposition: A | Discharge status: Home / Self Care | Payer: Medicare Other | Source: Ambulatory Visit | Attending: Family Medicine | Admitting: Family Medicine

## 2017-08-26 DIAGNOSIS — Z1231 Encounter for screening mammogram for malignant neoplasm of breast: Secondary | ICD-10-CM

## 2019-12-10 ENCOUNTER — Ambulatory Visit: Payer: Medicare Other | Attending: Internal Medicine

## 2019-12-10 DIAGNOSIS — Z23 Encounter for immunization: Secondary | ICD-10-CM

## 2019-12-10 NOTE — Progress Notes (Signed)
   Covid-19 Vaccination Clinic  Name:  Sarah Davies    MRN: [id number] DOB: [birth year]  12/10/2019  [name] was observed post Covid-19 immunization for 15 minutes without incidence. She was provided with Vaccine Information Sheet and instruction to access the V-Safe system.   [name] was instructed to call 911 with any severe reactions post vaccine: Marland Kitchen Difficulty breathing  . Swelling of your face and throat  . A fast heartbeat  . A bad rash all over your body  . Dizziness and weakness    Immunizations Administered    Name Date Dose VIS Date Route   Pfizer COVID-19 Vaccine 12/10/2019  9:45 AM 0.3 mL 09/21/2019 Intramuscular   Manufacturer: [place]   Lot: [id number]   [place]: [id number]

## 2020-01-02 ENCOUNTER — Ambulatory Visit: Payer: Medicare Other | Attending: Internal Medicine

## 2020-01-02 DIAGNOSIS — Z23 Encounter for immunization: Secondary | ICD-10-CM

## 2020-01-02 NOTE — Progress Notes (Signed)
   Covid-19 Vaccination Clinic  Name:  Sarah Davies    MRN: [id number] DOB: 1941-03-29  01/02/2020  Sarah Davies was observed post Covid-19 immunization for 30 minutes based on pre-vaccination screening without incident. She was provided with Vaccine Information Sheet and instruction to access the V-Safe system.   Sarah Davies was instructed to call 911 with any severe reactions post vaccine: Marland Kitchen Difficulty breathing  . Swelling of face and throat  . A fast heartbeat  . A bad rash all over body  . Dizziness and weakness   Immunizations Administered    Name Date Dose VIS Date Route   Pfizer COVID-19 Vaccine 01/02/2020  8:55 AM 0.3 mL 09/21/2019 Intramuscular   Manufacturer: [place]   Lot: [id number]   [place]: [id number]

## 2020-07-15 ENCOUNTER — Other Ambulatory Visit: Payer: Self-pay

## 2020-07-15 ENCOUNTER — Encounter: Payer: Self-pay | Admitting: Podiatry

## 2020-07-15 ENCOUNTER — Ambulatory Visit: Payer: Medicare PPO | Admitting: Podiatry

## 2020-07-15 DIAGNOSIS — B351 Tinea unguium: Secondary | ICD-10-CM | POA: Diagnosis not present

## 2020-07-15 DIAGNOSIS — E1142 Type 2 diabetes mellitus with diabetic polyneuropathy: Secondary | ICD-10-CM | POA: Diagnosis not present

## 2020-07-15 DIAGNOSIS — E1140 Type 2 diabetes mellitus with diabetic neuropathy, unspecified: Secondary | ICD-10-CM | POA: Insufficient documentation

## 2020-07-15 NOTE — Progress Notes (Signed)
This patient presents  to my office for at risk foot care.  This patient requires this care by a professional since this patient will be at risk due to having diabetes. She says she has burning in her feet.   This patient has not received nail care in over one year.  This patient is unable to cut nails herself since the patient cannot reach her nails.These nails are painful walking and wearing shoes.  This patient presents for at risk foot care today.  General Appearance  Alert, conversant and in no acute stress.  Vascular  Dorsalis pedis   pulses are weakly  palpable  Bilaterally. Posterior tibial pulses absent due to swelling.  Capillary return is within normal limits  bilaterally. Temperature is within normal limits  bilaterally.  Neurologic  Senn-Weinstein monofilament wire test within normal limits  bilaterally. Muscle power within normal limits bilaterally.  Nails Thick disfigured discolored nails with subungual debris  from hallux to fifth toes bilaterally. No evidence of bacterial infection or drainage bilaterally.  Orthopedic  No limitations of motion  feet .  No crepitus or effusions noted.  No bony pathology or digital deformities noted.  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  Skin  normotropic skin with no porokeratosis noted bilaterally.  No signs of infections or ulcers noted.     Onychomycosis  Pain in right toes  Pain in left toes  Consent was obtained for treatment procedures.   Mechanical debridement of nails 1-5  bilaterally performed with a nail nipper.  Filed with dremel without incident. Diabetic foot exam reveals vascular pathology with normal LOPS.   Return office visit  3 months                    Told patient to return for periodic foot care and evaluation due to potential at risk complications.   Gardiner Barefoot DPM

## 2020-10-21 ENCOUNTER — Ambulatory Visit: Payer: Medicare PPO | Admitting: Podiatry

## 2020-10-21 ENCOUNTER — Encounter: Payer: Self-pay | Admitting: Podiatry

## 2020-10-21 ENCOUNTER — Other Ambulatory Visit: Payer: Self-pay

## 2020-10-21 DIAGNOSIS — B351 Tinea unguium: Secondary | ICD-10-CM | POA: Diagnosis not present

## 2020-10-21 DIAGNOSIS — E1142 Type 2 diabetes mellitus with diabetic polyneuropathy: Secondary | ICD-10-CM

## 2020-10-21 NOTE — Progress Notes (Addendum)
This patient presents  to my office for at risk foot care.  This patient requires this care by a professional since this patient will be at risk due to having diabetes. She says she has burning in her feet.   This patient has not received nail care in over one year.  This patient is unable to cut nails herself since the patient cannot reach her nails.These nails are painful walking and wearing shoes.  This patient presents for at risk foot care today.  General Appearance  Alert, conversant and in no acute stress.  Vascular  Dorsalis pedis   pulses are weakly  palpable  Bilaterally. Posterior tibial pulses absent due to swelling.  Capillary return is within normal limits  bilaterally. Cold feet  Bilaterally.Absent pedal hair .  Neurologic  Senn-Weinstein monofilament wire test within normal limits  bilaterally. Muscle power within normal limits bilaterally.  Nails Thick disfigured discolored nails with subungual debris  from hallux to fifth toes bilaterally. No evidence of bacterial infection or drainage bilaterally.  Orthopedic  No limitations of motion  feet .  No crepitus or effusions noted.  No bony pathology or digital deformities noted.  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  Skin  normotropic skin with no porokeratosis noted bilaterally.  No signs of infections or ulcers noted.     Onychomycosis  Pain in right toes  Pain in left toes  Consent was obtained for treatment procedures.   Mechanical debridement of nails 1-5  bilaterally performed with a nail nipper.  Filed with dremel without incident.    Return office visit  3 months                    Told patient to return for periodic foot care and evaluation due to potential at risk complications.   Gardiner Barefoot DPM

## 2021-01-20 ENCOUNTER — Other Ambulatory Visit: Payer: Self-pay

## 2021-01-20 ENCOUNTER — Encounter: Payer: Self-pay | Admitting: Podiatry

## 2021-01-20 ENCOUNTER — Ambulatory Visit: Payer: Medicare PPO | Admitting: Podiatry

## 2021-01-20 DIAGNOSIS — E1142 Type 2 diabetes mellitus with diabetic polyneuropathy: Secondary | ICD-10-CM

## 2021-01-20 DIAGNOSIS — B351 Tinea unguium: Secondary | ICD-10-CM | POA: Diagnosis not present

## 2021-01-20 NOTE — Progress Notes (Signed)
This patient presents  to my office for at risk foot care.  This patient requires this care by a professional since this patient will be at risk due to having diabetes. She says she has burning in her feet.   This patient has not received nail care in over one year.  This patient is unable to cut nails herself since the patient cannot reach her nails.These nails are painful walking and wearing shoes.  This patient presents for at risk foot care today.  General Appearance  Alert, conversant and in no acute stress.  Vascular  Dorsalis pedis   pulses are weakly  palpable  Bilaterally. Posterior tibial pulses absent due to swelling.  Capillary return is within normal limits  bilaterally. Cold feet  Bilaterally.Absent pedal hair .  Neurologic  Senn-Weinstein monofilament wire test within normal limits  bilaterally. Muscle power within normal limits bilaterally.  Nails Thick disfigured discolored nails with subungual debris  from hallux to fifth toes bilaterally. No evidence of bacterial infection or drainage bilaterally.  Orthopedic  No limitations of motion  feet .  No crepitus or effusions noted.  No bony pathology or digital deformities noted.  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  Skin  normotropic skin with no porokeratosis noted bilaterally.  No signs of infections or ulcers noted.     Onychomycosis  Pain in right toes  Pain in left toes  Consent was obtained for treatment procedures.   Mechanical debridement of nails 1-5  bilaterally performed with a nail nipper.  Filed with dremel without incident. Patient has red swollen area above the medial mallelus left leg/ankle.  Told her to apply heat.  If this condition worsens she needs to see her medical doctor.   Return office visit  3 months                    Told patient to return for periodic foot care and evaluation due to potential at risk complications.   Gardiner Barefoot DPM

## 2021-04-21 ENCOUNTER — Encounter: Payer: Self-pay | Admitting: Podiatry

## 2021-04-21 ENCOUNTER — Other Ambulatory Visit: Payer: Self-pay

## 2021-04-21 ENCOUNTER — Ambulatory Visit: Payer: Medicare PPO | Admitting: Podiatry

## 2021-04-21 DIAGNOSIS — E1142 Type 2 diabetes mellitus with diabetic polyneuropathy: Secondary | ICD-10-CM | POA: Diagnosis not present

## 2021-04-21 DIAGNOSIS — B351 Tinea unguium: Secondary | ICD-10-CM | POA: Diagnosis not present

## 2021-04-21 NOTE — Progress Notes (Signed)
This patient presents  to my office for at risk foot care.  This patient requires this care by a professional since this patient will be at risk due to having diabetes. She says she has burning in her feet.   This patient has not received nail care in over one year.  This patient is unable to cut nails herself since the patient cannot reach her nails.These nails are painful walking and wearing shoes.  This patient presents for at risk foot care today.  General Appearance  Alert, conversant and in no acute stress.  Vascular  Dorsalis pedis   pulses are weakly  palpable  Bilaterally. Posterior tibial pulses absent due to swelling.  Capillary return is within normal limits  bilaterally. Cold feet  Bilaterally.Absent pedal hair .  Neurologic  Senn-Weinstein monofilament wire test within normal limits  bilaterally. Muscle power within normal limits bilaterally.  Nails Thick disfigured discolored nails with subungual debris  from hallux to fifth toes bilaterally. No evidence of bacterial infection or drainage bilaterally.  Orthopedic  No limitations of motion  feet .  No crepitus or effusions noted.  No bony pathology or digital deformities noted.  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  Skin  normotropic skin with no porokeratosis noted bilaterally.  No signs of infections or ulcers noted.     Onychomycosis  Pain in right toes  Pain in left toes  Consent was obtained for treatment procedures.   Mechanical debridement of nails 1-5  bilaterally performed with a nail nipper.  Filed with dremel without incident. Patient has red swollen area above the medial mallelus left leg/ankle.  Told her to apply heat.  If this condition worsens she needs to see her medical doctor.   Return office visit  3 months                    Told patient to return for periodic foot care and evaluation due to potential at risk complications.   Gardiner Barefoot DPM

## 2021-07-28 ENCOUNTER — Ambulatory Visit: Payer: Medicare PPO | Admitting: Podiatry

## 2021-07-28 ENCOUNTER — Encounter: Payer: Self-pay | Admitting: Podiatry

## 2021-07-28 ENCOUNTER — Other Ambulatory Visit: Payer: Self-pay

## 2021-07-28 DIAGNOSIS — B351 Tinea unguium: Secondary | ICD-10-CM

## 2021-07-28 DIAGNOSIS — E1142 Type 2 diabetes mellitus with diabetic polyneuropathy: Secondary | ICD-10-CM

## 2021-07-28 NOTE — Progress Notes (Signed)
This patient presents  to my office for at risk foot care.  This patient requires this care by a professional since this patient will be at risk due to having diabetes. She says she has burning in her feet.   This patient has not received nail care in over one year.  This patient is unable to cut nails herself since the patient cannot reach her nails.These nails are painful walking and wearing shoes.  This patient presents for at risk foot care today.  General Appearance  Alert, conversant and in no acute stress.  Vascular  Dorsalis pedis   pulses are weakly  palpable  Bilaterally. Posterior tibial pulses absent due to swelling.  Capillary return is within normal limits  bilaterally. Cold feet  Bilaterally.Absent pedal hair .  Neurologic  Senn-Weinstein monofilament wire test within normal limits  bilaterally. Muscle power within normal limits bilaterally.  Nails Thick disfigured discolored nails with subungual debris  from hallux to fifth toes bilaterally. No evidence of bacterial infection or drainage bilaterally.  Orthopedic  No limitations of motion  feet .  No crepitus or effusions noted.  No bony pathology or digital deformities noted.  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  Skin  normotropic skin with no porokeratosis noted bilaterally.  No signs of infections or ulcers noted.     Onychomycosis  Pain in right toes  Pain in left toes  Consent was obtained for treatment procedures.   Mechanical debridement of nails 1-5  bilaterally performed with a nail nipper.  Filed with dremel without incident.    Return office visit  3 months                    Told patient to return for periodic foot care and evaluation due to potential at risk complications.   Gardiner Barefoot DPM

## 2021-10-28 ENCOUNTER — Encounter: Payer: Self-pay | Admitting: Podiatry

## 2021-10-28 ENCOUNTER — Other Ambulatory Visit: Payer: Self-pay

## 2021-10-28 ENCOUNTER — Ambulatory Visit: Payer: Medicare PPO | Admitting: Podiatry

## 2021-10-28 DIAGNOSIS — M79675 Pain in left toe(s): Secondary | ICD-10-CM

## 2021-10-28 DIAGNOSIS — B351 Tinea unguium: Secondary | ICD-10-CM

## 2021-10-28 DIAGNOSIS — E1142 Type 2 diabetes mellitus with diabetic polyneuropathy: Secondary | ICD-10-CM

## 2021-10-28 DIAGNOSIS — M79674 Pain in right toe(s): Secondary | ICD-10-CM

## 2021-10-28 NOTE — Progress Notes (Signed)
This patient presents  to my office for at risk foot care.  This patient requires this care by a professional since this patient will be at risk due to having diabetes.  This patient is unable to cut nails herself since the patient cannot reach her nails.These nails are painful walking and wearing shoes.  This patient presents for at risk foot care today.  General Appearance  Alert, conversant and in no acute stress.  Vascular  Dorsalis pedis   pulses are weakly  palpable  Bilaterally. Posterior tibial pulses absent due to swelling.  Capillary return is within normal limits  bilaterally. Cold feet  Bilaterally.Absent pedal hair . Venous stasis  B/L.  Neurologic  Senn-Weinstein monofilament wire test within normal limits  bilaterally. Muscle power within normal limits bilaterally.  Nails Thick disfigured discolored nails with subungual debris  from hallux to fifth toes bilaterally. No evidence of bacterial infection or drainage bilaterally. Pincer hallux toenails  Orthopedic  No limitations of motion  feet .  No crepitus or effusions noted.  No bony pathology or digital deformities noted.  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  Skin  normotropic skin with no porokeratosis noted bilaterally.  No signs of infections or ulcers noted.     Onychomycosis  Pain in right toes  Pain in left toes  Consent was obtained for treatment procedures.   Mechanical debridement of nails 1-5  bilaterally performed with a nail nipper.  Filed with dremel without incident.    Return office visit  3 months                    Told patient to return for periodic foot care and evaluation due to potential at risk complications.   Amela Handley DPM  

## 2021-12-15 ENCOUNTER — Telehealth: Payer: Self-pay

## 2021-12-15 NOTE — Telephone Encounter (Signed)
NOTES SCANNED TO REFERRAL 

## 2022-01-27 ENCOUNTER — Ambulatory Visit: Payer: Medicare PPO | Admitting: Podiatry

## 2022-01-27 ENCOUNTER — Encounter: Payer: Self-pay | Admitting: Podiatry

## 2022-01-27 DIAGNOSIS — E1142 Type 2 diabetes mellitus with diabetic polyneuropathy: Secondary | ICD-10-CM | POA: Diagnosis not present

## 2022-01-27 DIAGNOSIS — M79674 Pain in right toe(s): Secondary | ICD-10-CM | POA: Diagnosis not present

## 2022-01-27 DIAGNOSIS — B351 Tinea unguium: Secondary | ICD-10-CM

## 2022-01-27 DIAGNOSIS — M79675 Pain in left toe(s): Secondary | ICD-10-CM | POA: Diagnosis not present

## 2022-01-27 NOTE — Progress Notes (Signed)
This patient presents  to my office for at risk foot care.  This patient requires this care by a professional since this patient will be at risk due to having diabetes.  This patient is unable to cut nails herself since the patient cannot reach her nails.These nails are painful walking and wearing shoes.  This patient presents for at risk foot care today.  General Appearance  Alert, conversant and in no acute stress.  Vascular  Dorsalis pedis   pulses are weakly  palpable  Bilaterally. Posterior tibial pulses absent due to swelling.  Capillary return is within normal limits  bilaterally. Cold feet  Bilaterally.Absent pedal hair . Venous stasis  B/L.  Neurologic  Senn-Weinstein monofilament wire test within normal limits  bilaterally. Muscle power within normal limits bilaterally.  Nails Thick disfigured discolored nails with subungual debris  from hallux to fifth toes bilaterally. No evidence of bacterial infection or drainage bilaterally. Pincer hallux toenails  Orthopedic  No limitations of motion  feet .  No crepitus or effusions noted.  No bony pathology or digital deformities noted.  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  Skin  normotropic skin with no porokeratosis noted bilaterally.  No signs of infections or ulcers noted.     Onychomycosis  Pain in right toes  Pain in left toes  Consent was obtained for treatment procedures.   Mechanical debridement of nails 1-5  bilaterally performed with a nail nipper.  Filed with dremel without incident.    Return office visit  3 months                    Told patient to return for periodic foot care and evaluation due to potential at risk complications.   Beauregard Jarrells DPM  

## 2022-02-16 ENCOUNTER — Ambulatory Visit: Payer: Self-pay | Admitting: Internal Medicine

## 2022-03-22 ENCOUNTER — Observation Stay (HOSPITAL_COMMUNITY)
Admission: EM | Admit: 2022-03-22 | Discharge: 2022-03-24 | Disposition: A | Discharge status: Home / Self Care | Payer: Medicare PPO | Attending: Internal Medicine | Admitting: Internal Medicine

## 2022-03-22 ENCOUNTER — Other Ambulatory Visit: Payer: Self-pay

## 2022-03-22 ENCOUNTER — Encounter (HOSPITAL_COMMUNITY): Payer: Self-pay | Admitting: Emergency Medicine

## 2022-03-22 ENCOUNTER — Emergency Department (HOSPITAL_COMMUNITY): Payer: Medicare PPO

## 2022-03-22 DIAGNOSIS — I10 Essential (primary) hypertension: Secondary | ICD-10-CM | POA: Insufficient documentation

## 2022-03-22 DIAGNOSIS — R634 Abnormal weight loss: Secondary | ICD-10-CM | POA: Diagnosis not present

## 2022-03-22 DIAGNOSIS — K8689 Other specified diseases of pancreas: Principal | ICD-10-CM | POA: Diagnosis present

## 2022-03-22 DIAGNOSIS — Z7984 Long term (current) use of oral hypoglycemic drugs: Secondary | ICD-10-CM | POA: Diagnosis not present

## 2022-03-22 DIAGNOSIS — I152 Hypertension secondary to endocrine disorders: Secondary | ICD-10-CM

## 2022-03-22 DIAGNOSIS — E1169 Type 2 diabetes mellitus with other specified complication: Secondary | ICD-10-CM | POA: Diagnosis present

## 2022-03-22 DIAGNOSIS — R739 Hyperglycemia, unspecified: Secondary | ICD-10-CM

## 2022-03-22 DIAGNOSIS — R109 Unspecified abdominal pain: Secondary | ICD-10-CM | POA: Diagnosis present

## 2022-03-22 DIAGNOSIS — E1159 Type 2 diabetes mellitus with other circulatory complications: Secondary | ICD-10-CM | POA: Diagnosis not present

## 2022-03-22 DIAGNOSIS — E876 Hypokalemia: Secondary | ICD-10-CM | POA: Insufficient documentation

## 2022-03-22 DIAGNOSIS — E1165 Type 2 diabetes mellitus with hyperglycemia: Secondary | ICD-10-CM | POA: Diagnosis not present

## 2022-03-22 DIAGNOSIS — E785 Hyperlipidemia, unspecified: Secondary | ICD-10-CM | POA: Diagnosis not present

## 2022-03-22 DIAGNOSIS — E119 Type 2 diabetes mellitus without complications: Secondary | ICD-10-CM | POA: Insufficient documentation

## 2022-03-22 DIAGNOSIS — Z79899 Other long term (current) drug therapy: Secondary | ICD-10-CM | POA: Diagnosis not present

## 2022-03-22 LAB — COMPREHENSIVE METABOLIC PANEL
ALT: 22 U/L (ref 0–44)
AST: 28 U/L (ref 15–41)
Albumin: 3.5 g/dL (ref 3.5–5.0)
Alkaline Phosphatase: 81 U/L (ref 38–126)
Anion gap: 9 (ref 5–15)
BUN: 9 mg/dL (ref 8–23)
CO2: 24 mmol/L (ref 22–32)
Calcium: 9 mg/dL (ref 8.9–10.3)
Chloride: 102 mmol/L (ref 98–111)
Creatinine, Ser: 0.71 mg/dL (ref 0.44–1.00)
GFR, Estimated: >60 mL/min (ref >60)
Glucose, Bld: 356 mg/dL — ABNORMAL HIGH (ref 70–99)
Potassium: 3.2 mmol/L — ABNORMAL LOW (ref 3.5–5.1)
Sodium: 135 mmol/L (ref 135–145)
Total Bilirubin: 0.9 mg/dL (ref 0.3–1.2)
Total Protein: 7.7 g/dL (ref 6.5–8.1)

## 2022-03-22 LAB — CBC WITH DIFFERENTIAL/PLATELET
Abs Immature Granulocytes: 0.01 10*3/uL (ref 0.00–0.07)
Basophils Absolute: 0 10*3/uL (ref 0.0–0.1)
Basophils Relative: 1 %
Eosinophils Absolute: 0.2 10*3/uL (ref 0.0–0.5)
Eosinophils Relative: 3 %
HCT: 38.6 % (ref 36.0–46.0)
Hemoglobin: 12.4 g/dL (ref 12.0–15.0)
Immature Granulocytes: 0 %
Lymphocytes Relative: 24 %
Lymphs Abs: 1.5 10*3/uL (ref 0.7–4.0)
MCH: 27.6 pg (ref 26.0–34.0)
MCHC: 32.1 g/dL (ref 30.0–36.0)
MCV: 86 fL (ref 80.0–100.0)
Monocytes Absolute: 0.6 10*3/uL (ref 0.1–1.0)
Monocytes Relative: 10 %
Neutro Abs: 3.7 10*3/uL (ref 1.7–7.7)
Neutrophils Relative %: 62 %
Platelets: 167 10*3/uL (ref 150–400)
RBC: 4.49 MIL/uL (ref 3.87–5.11)
RDW: 12.8 % (ref 11.5–15.5)
WBC: 6.1 10*3/uL (ref 4.0–10.5)
nRBC: 0 % (ref 0.0–0.2)

## 2022-03-22 LAB — CBG MONITORING, ED: Glucose-Capillary: 407 mg/dL — ABNORMAL HIGH (ref 70–99)

## 2022-03-22 LAB — BLOOD GAS, VENOUS
Acid-Base Excess: 2.2 mmol/L — ABNORMAL HIGH (ref 0.0–2.0)
Bicarbonate: 27.3 mmol/L (ref 20.0–28.0)
O2 Saturation: 100 %
Patient temperature: 37
pCO2, Ven: 43 mmHg — ABNORMAL LOW (ref 44–60)
pH, Ven: 7.41 (ref 7.25–7.43)
pO2, Ven: 121 mmHg — ABNORMAL HIGH (ref 32–45)

## 2022-03-22 LAB — LIPASE, BLOOD: Lipase: 45 U/L (ref 11–51)

## 2022-03-22 IMAGING — CT CT ABD-PELV W/ CM
2 of 5 series · 15 of 46 positions shown, 17 images · IV contrast (agent unspecified)
Comparison: None Available.

CLINICAL DATA: Acute abdominal pain.  An intentional weight loss.

EXAM:
CT ABDOMEN AND PELVIS WITH CONTRAST
TECHNIQUE: Multidetector CT imaging of the abdomen and pelvis was performed
using the standard protocol following bolus administration of
intravenous contrast.

[Series 2: axial st · axial · 0.89mm/px · z∈[+1153,+1558]mm · 12 of 95 slices shown, 14 images]
[im 7/95  soft-tissue]
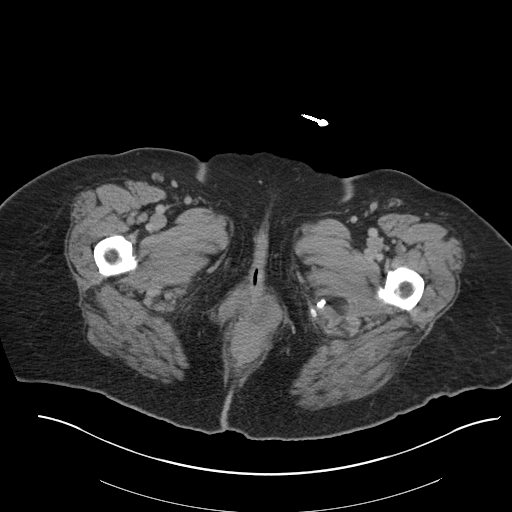
[im 7/95  bone]
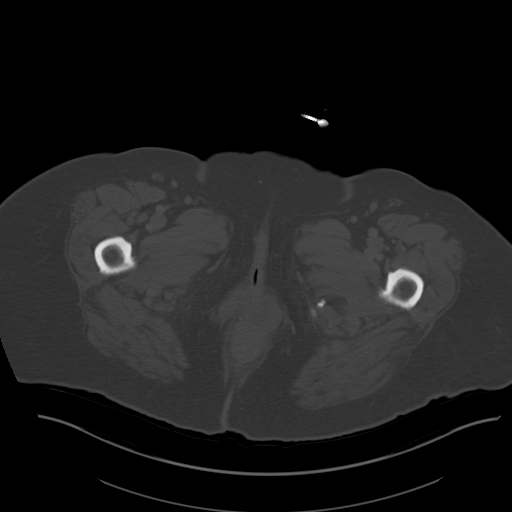
[im 13/95  soft-tissue]
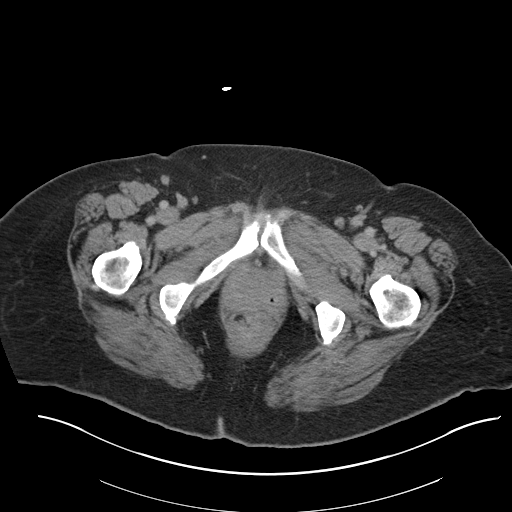
[im 19/95  soft-tissue]
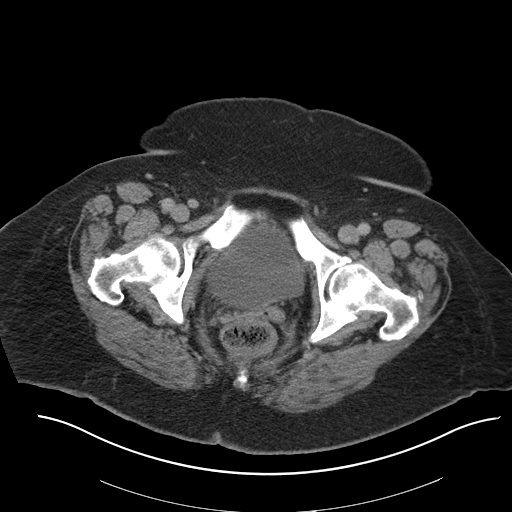
[im 32/95  soft-tissue]
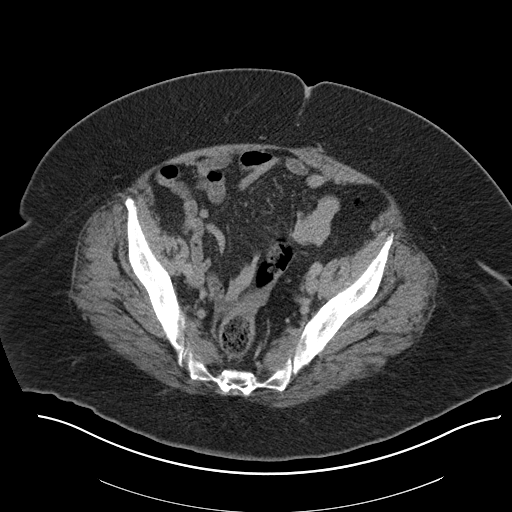
[im 38/95  soft-tissue]
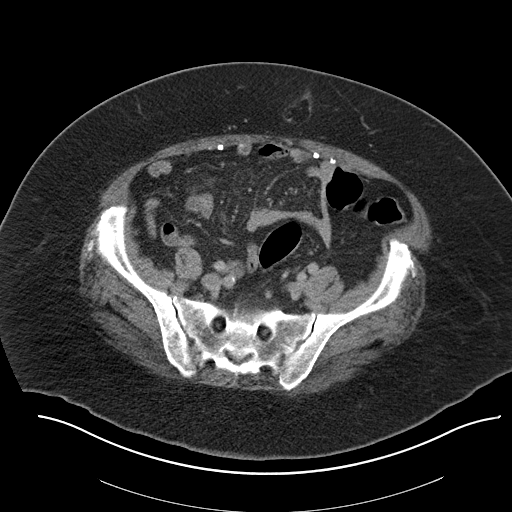
[im 44/95  soft-tissue]
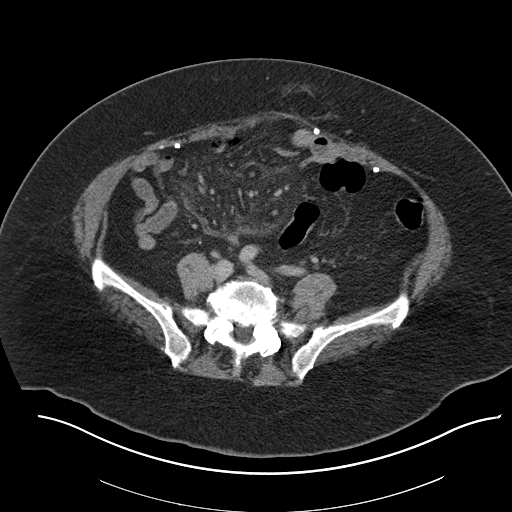
[im 51/95  soft-tissue]
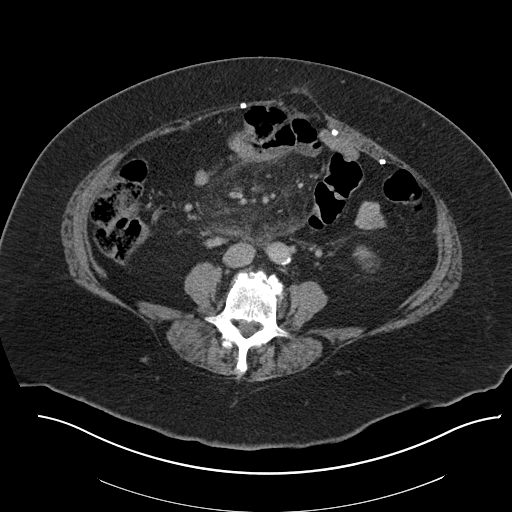
[im 57/95  soft-tissue]
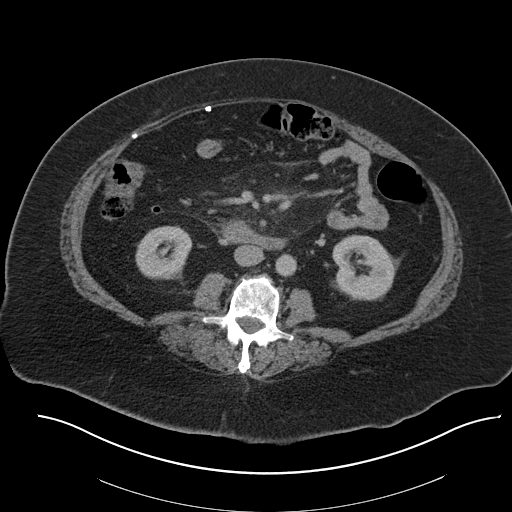
[im 63/95  soft-tissue]
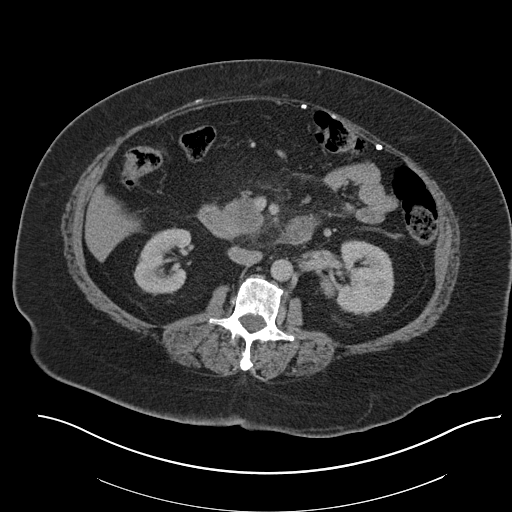
[im 63/95  bone]
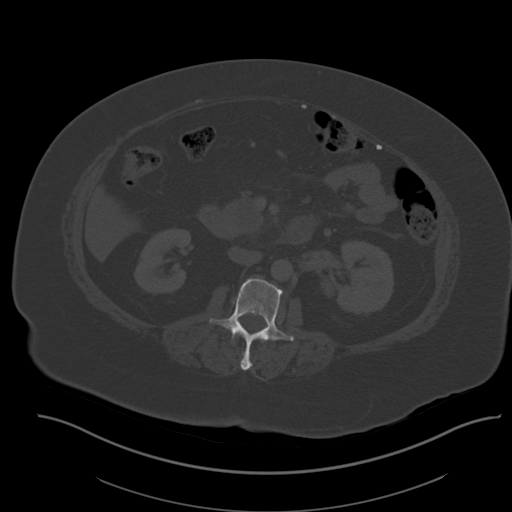
[im 76/95  soft-tissue]
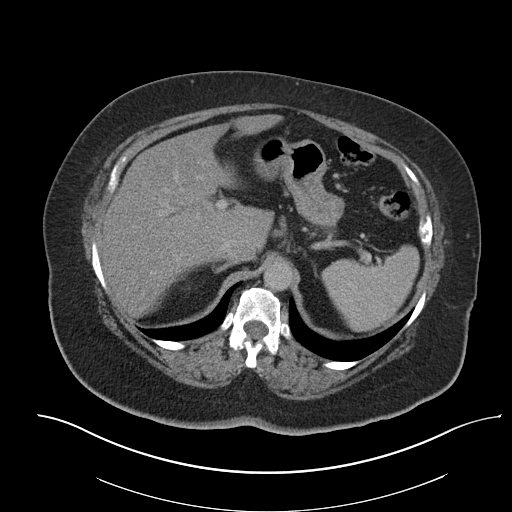
[im 82/95  soft-tissue]
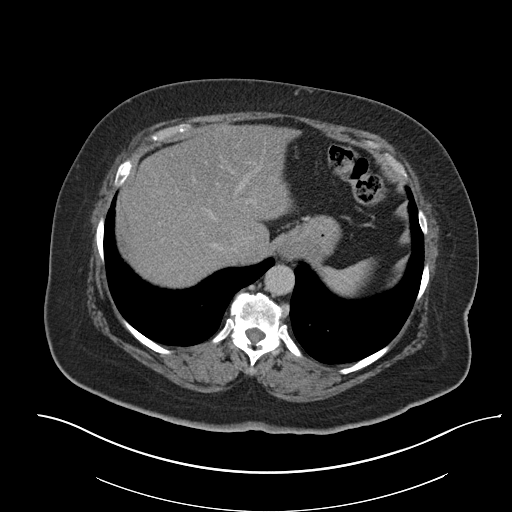
[im 88/95  soft-tissue]
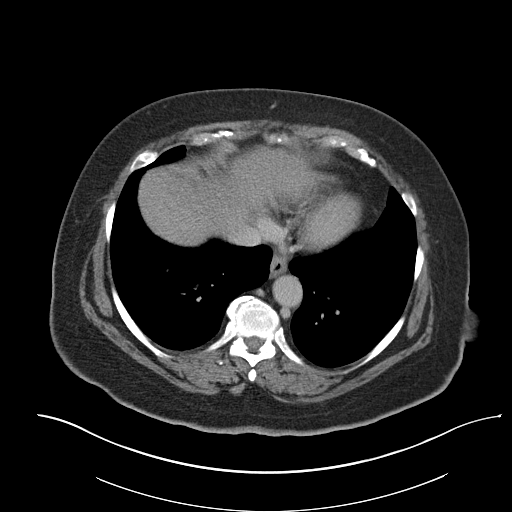

[Series 4: coronal st · coronal · 0.96mm/px · 3 of 173 slices shown]
[im 58/173  soft-tissue]
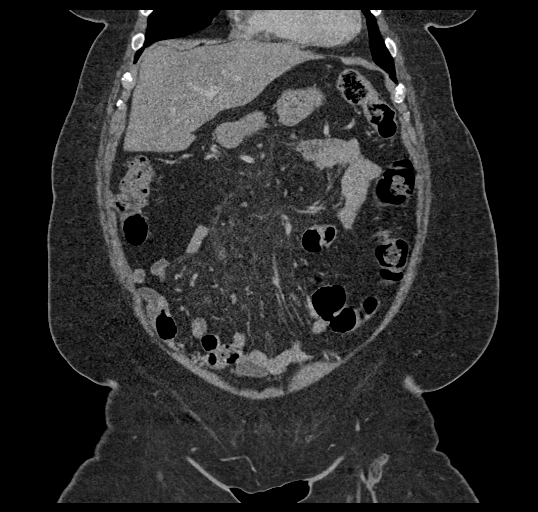
[im 77/173  soft-tissue]
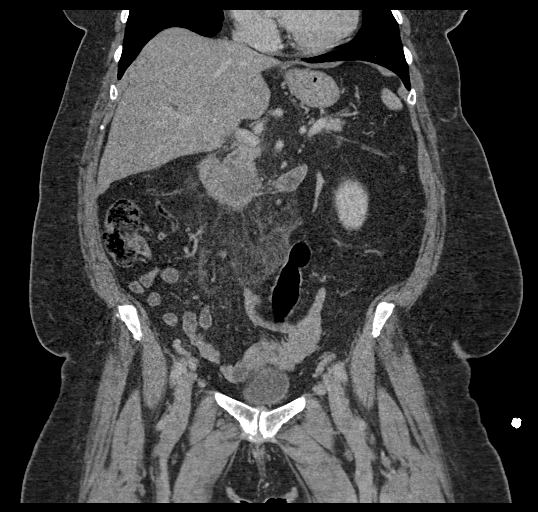
[im 96/173  soft-tissue]
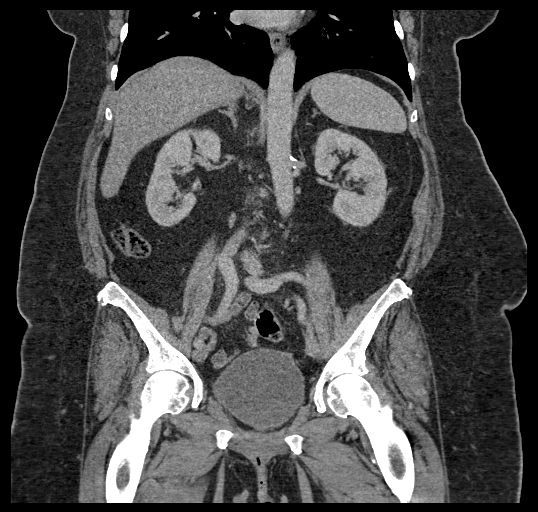

[15 of 46 positions shown; findings below may reference images not displayed]

RADIATION DOSE REDUCTION: This exam was performed according to the
departmental dose-optimization program which includes automated
exposure control, adjustment of the mA and/or kV according to
patient size and/or use of iterative reconstruction technique.

CONTRAST:  100mL OMNIPAQUE IOHEXOL 300 MG/ML  SOLN
FINDINGS: Lower chest: The visualized lung bases are clear.

No intra-abdominal free air.  Small free fluid in the pelvis.

Hepatobiliary: Fatty liver. Faint hypodense focus in the inferior
right lobe of the liver ([DATE] and 72/4) is too small to
characterize. No intrahepatic biliary dilatation. Cholecystectomy.
No retained calcified stone noted in the central CBD.

Pancreas: There is an ill-defined 3.6 x 2.5 cm hypodense mass in the
uncinate process of the pancreas. There is mild atrophy of the
pancreas with mild dilatation of the main pancreatic duct. There is
inflammatory changes and stranding of the mesentery inferior to the
pancreas. Findings concerning for a pancreatic malignancy
(adenocarcinoma) given patient's history of unintentional weight
loss. There may be superimposed pancreatitis. Correlation with
pancreatic enzymes recommended. Further evaluation of the pancreatic
lesion with pancreatic mass protocol MRI on a nonemergent/outpatient
basis recommended. Additional 11 mm hypodense lesion along the
inferior body of the pancreas is not characterized but may represent
a side branch IPMN. No drainable fluid collection/abscess or
pseudocyst.

Spleen: Normal in size without focal abnormality.

Adrenals/Urinary Tract: The adrenal glands are unremarkable. There
is no hydronephrosis on either side. There is symmetric enhancement
and excretion of contrast by both kidneys. The visualized ureters
and urinary bladder appear unremarkable.

Stomach/Bowel: There is no bowel obstruction or active inflammation.
The appendix is normal.

Vascular/Lymphatic: Mild aortoiliac atherosclerotic disease. The IVC
is unremarkable. No portal venous gas. Small scattered mesenteric
lymph nodes. No adenopathy.

Reproductive: Hysterectomy.  No adnexal masses.

Other: Ventral hernia repair mesh. Small fat containing ventral
hernia noted. There is abutment of several loops of small bowel to
the anterior hernia repair mesh consistent with adhesions.

Musculoskeletal: Degenerative changes of the spine. No acute osseous
pathology.
IMPRESSION: 1. Findings most concerning for pancreatic malignancy arising in the
uncinate process of the pancreas with possible associated acute
pancreatitis. Correlation with pancreatic enzymes and further
evaluation of the pancreatic mass with MRI on a
nonemergent/outpatient basis recommended.
2. Fatty liver.
3. No bowel obstruction. Normal appendix.
4. Aortic Atherosclerosis ([O2]-[O2]).

## 2022-03-22 MED ORDER — INSULIN ASPART 100 UNIT/ML IJ SOLN
0.0000 [IU] | Freq: Every day | INTRAMUSCULAR | Status: DC
  Administered 2022-03-23 (×2): 3 [IU] via SUBCUTANEOUS
  Filled 2022-03-22: qty 0.05

## 2022-03-22 MED ORDER — INSULIN ASPART 100 UNIT/ML IJ SOLN
0.0000 [IU] | Freq: Three times a day (TID) | INTRAMUSCULAR | Status: DC
  Administered 2022-03-23: 8 [IU] via SUBCUTANEOUS
  Administered 2022-03-23 (×2): 3 [IU] via SUBCUTANEOUS
  Administered 2022-03-24: 8 [IU] via SUBCUTANEOUS
  Administered 2022-03-24: 3 [IU] via SUBCUTANEOUS
  Filled 2022-03-22: qty 0.15

## 2022-03-22 MED ORDER — IOHEXOL 300 MG/ML  SOLN
100.0000 mL | Freq: Once | INTRAMUSCULAR | Status: AC | PRN
  Administered 2022-03-22: 100 mL via INTRAVENOUS

## 2022-03-22 MED ORDER — LABETALOL HCL 5 MG/ML IV SOLN
10.0000 mg | Freq: Once | INTRAVENOUS | Status: AC
  Administered 2022-03-22: 10 mg via INTRAVENOUS
  Filled 2022-03-22: qty 4

## 2022-03-22 NOTE — Assessment & Plan Note (Signed)
Associated Problem(s): Hyperlipidemia associated with type 2 diabetes mellitus (HCC)  Formatting of this note might be different from the original.  Stable.  Electronically signed by Carollee Herterhen, Eric, DO at 03/22/2022 11:44 PM EDT

## 2022-03-22 NOTE — ED Triage Notes (Signed)
Formatting of this note might be different from the original.  Patient arrives ambulatory with cane c/o hyperglycemia over the past few weeks. States she does not take insulin and blood sugars have been in the 500s recently. States she has lost weight over the past month.   Electronically signed by Colin Bentonand, Kathryn M, RN at 03/22/2022  5:44 PM EDT

## 2022-03-22 NOTE — Assessment & Plan Note (Signed)
Associated Problem(s): Unintentional weight loss  Formatting of this note might be different from the original.  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  She is lost about 30 to 40 pounds in the last 3 weeks.  Electronically signed by Carollee Herterhen, Eric, DO at 03/22/2022 11:44 PM EDT

## 2022-03-22 NOTE — ED Provider Notes (Signed)
Formatting of this note is different from the original.  Images from the original note were not included.    [facility]-EMERGENCY DEPT  Provider Note    CSN: [id number]  Arrival date & time: 03/22/22  1700        History    Chief Complaint   Patient presents with    Hyperglycemia     Lynn Blackwell is a 81 y.o. female.    The history is provided by the patient and medical records.   Hyperglycemia  Associated symptoms: abdominal pain      81 y.o. F presenting to the ED for hyperglycemia.  States since the end of May her blood sugars have been out of whack.  She states ranging anywhere from 300s to 500s, even while taking her medications as directed.  States over the past month she has lost approximately 37 pounds unintentionally.  She does report her appetite is poor, only able to eat very small amounts before becoming full quickly.  She does report diarrhea usually right after eating.  She has had some nausea but denies vomiting.  She does report upper abdominal discomfort, worse after eating.  She does report feeling generally weak, already ambulates with a cane.  She has not had any falls.  Denies any unexplained fevers.  She does have history of endometrial cancer s/p hysterectomy and bilateral oophorectomy.    Home Medications  Prior to Admission medications    Medication Sig Start Date End Date Taking? Authorizing Provider   b complex vitamins tablet Take 1 tablet by mouth daily.    [provider]   calcium carbonate (OS-CAL) 600 MG TABS Take 600 mg by mouth 2 (two) times daily with a meal.    [provider]   carvedilol (COREG) 12.5 MG tablet Take 12.5 mg by mouth 2 (two) times daily with a meal.  03/21/13   [provider]   cholecalciferol (VITAMIN D) 1000 UNITS tablet Take 1,000 Units by mouth daily.    [provider]   co-enzyme Q-10 50 MG capsule Take 50 mg by mouth daily.    [provider]   doxazosin (CARDURA) 4 MG tablet Take 4 mg  by mouth at bedtime.  01/19/13   [provider]   doxycycline (VIBRA-TABS) 100 MG tablet Take 1 tablet (100 mg total) by mouth 2 (two) times daily. 02/04/14   Le, Thao P, DO   furosemide (LASIX) 20 MG tablet Take 20 mg by mouth daily.  03/16/13   [provider]   lisinopril (PRINIVIL,ZESTRIL) 10 MG tablet Take 10 mg by mouth 2 (two) times daily.  03/02/13   [provider]   magnesium 30 MG tablet Take 30 mg by mouth daily.    [provider]       Allergies     Nicotine, Other, Molds & smuts, and Shellfish allergy      Review of Systems    Review of Systems   Constitutional:  Positive for unexpected weight change.   Gastrointestinal:  Positive for abdominal pain and diarrhea.   All other systems reviewed and are negative.    Physical Exam  Updated Vital Signs  BP (!) 195/72   Pulse 66   Temp 98.7 F (37.1 C) (Oral)   Resp 16   Ht 5' 6 (1.676 m)   Wt 112.5 kg   SpO2 99%   BMI 40.03 kg/m     Physical Exam  Vitals and nursing  note reviewed.   Constitutional:       Appearance: She is well-developed.      Comments: Seems a little pale   HENT:      Head: Normocephalic and atraumatic.   Eyes:      Conjunctiva/sclera: Conjunctivae normal.      Pupils: Pupils are equal, round, and reactive to light.   Cardiovascular:      Rate and Rhythm: Normal rate and regular rhythm.      Heart sounds: Normal heart sounds.   Pulmonary:      Effort: Pulmonary effort is normal.      Breath sounds: Normal breath sounds.   Abdominal:      General: Bowel sounds are normal.      Palpations: Abdomen is soft.      Tenderness: There is no abdominal tenderness. There is no guarding or rebound.      Comments: Mildly tender epigastrium without rebound or guarding   Musculoskeletal:         General: Normal range of motion.      Cervical back: Normal range of motion.   Skin:     General: Skin is warm and dry.   Neurological:      Mental Status: She is alert and oriented to person, place, and time.     ED  Results / Procedures / Treatments    Labs  (all labs ordered are listed, but only abnormal results are displayed)  Labs Reviewed   COMPREHENSIVE METABOLIC PANEL - Abnormal; Notable for the following components:       Result Value    Potassium 3.2 (*)     Glucose, Bld 356 (*)     All other components within normal limits   BLOOD GAS, VENOUS - Abnormal; Notable for the following components:    pCO2, Ven 43 (*)     pO2, Ven 121 (*)     Acid-Base Excess 2.2 (*)     All other components within normal limits   CBG MONITORING, ED - Abnormal; Notable for the following components:    Glucose-Capillary 407 (*)     All other components within normal limits   CBC WITH DIFFERENTIAL/PLATELET   LIPASE, BLOOD   URINALYSIS, ROUTINE W REFLEX MICROSCOPIC   CBG MONITORING, ED     EKG  None    Radiology  CT Abdomen Pelvis W Contrast    Result Date: 03/22/2022  CLINICAL DATA:  Acute abdominal pain.  An intentional weight loss. EXAM: CT ABDOMEN AND PELVIS WITH CONTRAST TECHNIQUE: Multidetector CT imaging of the abdomen and pelvis was performed using the standard protocol following bolus administration of intravenous contrast. RADIATION DOSE REDUCTION: This exam was performed according to the departmental dose-optimization program which includes automated exposure control, adjustment of the mA and/or kV according to patient size and/or use of iterative reconstruction technique. CONTRAST:  OMNIPAQUE IOHEXOL 300 MG/ML  SOLN COMPARISON:  None Available. FINDINGS: Lower chest: The visualized lung bases are clear. No intra-abdominal free air.  Small free fluid in the pelvis. Hepatobiliary: Fatty liver. Faint hypodense focus in the inferior right lobe of the liver (27/2 and 72/4) is too small to characterize. No intrahepatic biliary dilatation. Cholecystectomy. No retained calcified stone noted in the central CBD. Pancreas: There is an ill-defined 3.6 x 2.5 cm hypodense mass in the uncinate process of the pancreas. There is mild atrophy of  the pancreas with mild dilatation of the main pancreatic duct. There is inflammatory changes and stranding of the mesentery  inferior to the pancreas. 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 There may be superimposed pancreatitis. Correlation with pancreatic enzymes recommended. Further evaluation of the pancreatic lesion with pancreatic mass protocol MRI on a nonemergent/outpatient basis recommended. 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 No drainable fluid collection/abscess or pseudocyst. Spleen: Normal in size without focal abnormality. Adrenals/Urinary Tract: The adrenal glands are unremarkable. There is no hydronephrosis on either side. There is symmetric enhancement and excretion of contrast by both kidneys. The visualized ureters and urinary bladder appear unremarkable. Stomach/Bowel: There is no bowel obstruction or active inflammation. The appendix is normal. Vascular/Lymphatic: Mild aortoiliac atherosclerotic disease. The IVC is unremarkable. No portal venous gas. Small scattered mesenteric lymph nodes. No adenopathy. Reproductive: Hysterectomy.  No adnexal masses. Other: Ventral hernia repair mesh. Small fat containing ventral hernia noted. There is abutment of several loops of small bowel to the anterior hernia repair mesh consistent with adhesions. Musculoskeletal: Degenerative changes of the spine. No acute osseous pathology. IMPRESSION: 1. Findings most concerning for pancreatic malignancy arising in the uncinate process of the pancreas with possible associated acute pancreatitis. Correlation with pancreatic enzymes and further evaluation of the pancreatic mass with MRI on a nonemergent/outpatient basis recommended. 2. Fatty liver. 3. No bowel obstruction. Normal appendix. 4. Aortic Atherosclerosis (ICD10-I70.0). Electronically  Signed   By: Elgie CollardArash  Radparvar M.D.   On: 03/22/2022 21:54      Procedures  Procedures     Medications Ordered in ED  Medications   iohexol (OMNIPAQUE) 300 MG/ML solution 100 mL (100 mLs Intravenous Contrast Given 03/22/22 2129)     ED Course/ Medical Decision Making/ A&P      Medical Decision Making  Risk  Decision regarding hospitalization.    81 year old female presenting to the ED with abdominal discomfort, unintentional weight loss, and hyperglycemia over the past few weeks.  Estimates 37 pound unintentional weight loss.  No unexplained fevers.  She has had a lot of diarrhea and poor appetite.    She is afebrile, nontoxic in appearance, does appear a bit pale.  She is not jaundiced.  She does have some mild tenderness in her epigastrium without rebound or guarding.  No peritoneal signs.  Labs with hyperglycemia, but no findings of DKA.  Potassium is mildly low at 3.2, likely from GI loss.  Will give supplementation.  CT obtained from triage and reviewed, findings concerning for pancreatic mass, possibly acute pancreatitis.  Lipase is normal.  Results discussed with patient, she is upset as expected.  She does have history of endometrial cancer but no history of pancreatic issues.  No family history of pancreatic cancer.  I feel she will require admission for expedited work-up of this, she is agreeable.  She is followed by GI, Dr. Loreta AveMann.    Discussed with hospitalist, Dr. Imogene Burnhen-- will admit for ongoing care.      Final Clinical Impression(s) / ED Diagnoses  Final diagnoses:   Hyperglycemia   Pancreatic mass     Rx / DC Orders  ED Discharge Orders       None           Garlon HatchetSanders, Lisa M, PA-C  03/23/22 0009      Sabas SousBero, Michael M, MD  03/23/22 [phone] [id number]    Electronically signed by Sabas SousBero, Michael M, MD at 03/23/2022  7:08 AM EDT    Associated attestation - Sabas SousBero, Michael M,  MD - 03/23/2022  7:08 AM EDT  Formatting of this note might be different from the original.  Attestation: Medical screening examination/treatment/procedure(s)  were performed by non-physician practitioner and as supervising physician I was immediately available for consultation/collaboration.

## 2022-03-22 NOTE — Assessment & Plan Note (Signed)
Associated Problem(s): Hypertension associated with diabetes (HCC)  Formatting of this note might be different from the original.  Uncontrolled.  Elevated blood pressures in the ER.  We will give 10 mg of IV labetalol x1.  Continue her home meds.  Including Coreg 12.5 mg twice daily, Cardura 4 mg daily, Lasix 20 mg daily, Zestril 10 mg twice daily.  Electronically signed by Carollee Herter, DO at 03/22/2022 11:45 PM EDT

## 2022-03-22 NOTE — Other (Signed)
Formatting of this note might be different from the original.  Chief complaint: Abdominal pain, weight loss  History of present illness:  [name], history of type 2 diabetes on glipizide, hypertension, hyperlipidemia, morbid obesity with a BMI of 40 presents to the ER today with a 3-week history of weight loss, 3 weeks of diarrhea, 1 week history of worsening abdominal fullness/tightness.    Patient states that she has been having yellow watery diarrhea.  Denies any blood in her stools.    She has noted a 30 to 40 pound weight loss in the last week.    Her blood sugars also been very high greater than 300 over the last week.    Appetite has been poor.    Patient has not been able to see her PCP as the practice recently changed from [facility] to [facility].  She has not seen her new physician yet.    On arrival to the ER temp 98.7 heart rate 66 blood pressure 168/84 satting 96% on room air.    Labs showed a sodium 135, potassium 3.2, bicarb of 9, creatinine 0.7, glucose 356    Lipase of 45    White count 6.1, he 112.4, platelets 167    CT abdomen pelvis with IV contrast.  I personally reviewed and interpreted these images.  Shows a new pancreatic mass.  Patient is status postcholecystectomy.    Due to the patient's weight loss and new pancreatic mass, [facility] hospitalist contacted for admission.  Electronically signed by Carollee Herterhen, Eric, DO at 03/22/2022 11:49 PM EDT

## 2022-03-22 NOTE — Assessment & Plan Note (Signed)
Associated Problem(s): Pancreatic mass  Formatting of this note might be different from the original.  Signed to observation medical bed.  Keep n.p.o. except for sips of clear liquids.  MRCP in the morning.  EDP states they will contact GI for consult.  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  She will ultimately need a biopsy.  Probable outpatient oncology follow-up.    Pancreatic mass is a Acute illness/condition that poses a threat to life or bodily function.    Electronically signed by Carollee Herterhen, Eric, DO at 03/22/2022 11:52 PM EDT

## 2022-03-22 NOTE — H&P (Signed)
Formatting of this note is different from the original.  Images from the original note were not included.    History and Physical     Lynn Blackwell:[id number] DOB: 1941-04-30 DOA: 03/22/2022    DOS: the patient was seen and examined on 03/22/2022    PCP: Margot Ables, MD (Inactive)     Patient coming from: Home    I have personally briefly reviewed patient's old medical records in [facility]    Chief complaint: Abdominal pain, weight loss  History of present illness:  [name], history of type 2 diabetes on glipizide, hypertension, hyperlipidemia, morbid obesity with a BMI of 40 presents to the ER today with a 3-week history of weight loss, 3 weeks of diarrhea, 1 week history of worsening abdominal fullness/tightness.    Patient states that she has been having yellow watery diarrhea.  Denies any blood in her stools.    She has noted a 30 to 40 pound weight loss in the last week.    Her blood sugars also been very high greater than 300 over the last week.    Appetite has been poor.    Patient has not been able to see her PCP as the practice recently changed from [facility] to [facility].  She has not seen her new physician yet.    On arrival to the ER temp 98.7 heart rate 66 blood pressure 168/84 satting 96% on room air.    Labs showed a sodium 135, potassium 3.2, bicarb of 9, creatinine 0.7, glucose 356    Lipase of 45    White count 6.1, he 112.4, platelets 167    CT abdomen pelvis with IV contrast.  I personally reviewed and interpreted these images.  Shows a new pancreatic mass.  Patient is status postcholecystectomy.    Due to the patient's weight loss and new pancreatic mass, [facility] contacted for admission.     ED Course: CT abdomen pelvis shows new pancreatic mass.    Review of Systems:   Review of Systems   Constitutional:  Positive for malaise/fatigue and weight loss. Negative for chills, diaphoresis and fever.        30 to 40 pound weight loss in 3  weeks.   HENT: Negative.     Eyes: Negative.    Respiratory: Negative.     Cardiovascular: Negative.    Gastrointestinal:  Positive for abdominal pain and diarrhea.   Genitourinary: Negative.    Musculoskeletal: Negative.    Skin: Negative.    Neurological: Negative.    Endo/Heme/Allergies: Negative.    Psychiatric/Behavioral: Negative.     All other systems reviewed and are negative.    Past Medical History:   Diagnosis Date    Allergy     Cataract     Diabetes mellitus without complication (HCC)     Hypertension      Past Surgical History:   Procedure Laterality Date    ABDOMINAL HYSTERECTOMY      CHOLECYSTECTOMY      SPINE SURGERY        reports that she has never smoked. She has never used smokeless tobacco. She reports that she does not drink alcohol and does not use drugs.    Allergies   Allergen Reactions    Nicotine Other (See Comments)     Throat closes     Other Other (See Comments)     Joint pain  Other reaction(s): Other (See Comments)  Animal Dander  From when she was tested, it was stimulated.       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    Other reaction(s): Other (See Comments)  From when she was tested, it was stimulated.       History reviewed. No pertinent family history.    Prior to Admission medications    Medication Sig Start Date End Date Taking? Authorizing Provider   b complex vitamins tablet Take 1 tablet by mouth daily.    [provider]   calcium carbonate (OS-CAL) 600 MG TABS Take 600 mg by mouth 2 (two) times daily with a meal.    [provider]   carvedilol (COREG) 12.5 MG tablet Take 12.5 mg by mouth 2 (two) times daily with a meal.  03/21/13   [provider]   cholecalciferol (VITAMIN D) 1000 UNITS tablet Take 1,000 Units by mouth daily.    [provider]   co-enzyme Q-10 50 MG capsule Take 50 mg by mouth daily.    [provider]   doxazosin (CARDURA) 4 MG tablet Take 4 mg by mouth at bedtime.  01/19/13   [provider]   doxycycline (VIBRA-TABS) 100 MG tablet Take 1 tablet (100 mg total) by mouth 2 (two) times daily. 02/04/14   Le, Thao P, DO   furosemide (LASIX) 20 MG tablet Take 20 mg by mouth daily.  03/16/13   [provider]   lisinopril (PRINIVIL,ZESTRIL) 10 MG tablet Take 10 mg by mouth 2 (two) times daily.  03/02/13   [provider]   magnesium 30 MG tablet Take 30 mg by mouth daily.    [provider]     Physical Exam:  Vitals:    03/22/22 1730 03/22/22 1743 03/22/22 2310 03/22/22 2330   BP: (!) 168/84  (!) 195/72 (!) 187/85   Pulse: 66  66 62   Resp: 20  16 13    Temp: 98.7 F (37.1 C)      TempSrc: Oral      SpO2: 96%  99% 99%   Weight:  112.5 kg     Height:  5' 6 (1.676 m)       Physical Exam  Vitals and nursing note reviewed.   Constitutional:       General: She is not in acute distress.     Appearance: Normal appearance. She is obese. She is not ill-appearing, toxic-appearing or diaphoretic.   HENT:      Head: Normocephalic and atraumatic.      Nose: Nose normal. No rhinorrhea.   Eyes:      General: No scleral icterus.  Cardiovascular:      Rate and Rhythm: Normal rate and regular rhythm.   Pulmonary:      Effort: Pulmonary effort is normal. No respiratory distress.      Breath sounds: Normal breath sounds. No wheezing or rales.   Abdominal:      General: Bowel sounds are normal.      Palpations: Abdomen is soft.      Tenderness: There is abdominal tenderness in the epigastric area. There is no guarding or rebound.   Musculoskeletal:      Right lower leg: 1+ Edema present.  Left lower leg: 1+ Edema present.      Comments: Bilateral lower extremity, ankle, pedal edema.  +1.   Skin:     General: Skin is warm and dry.      Capillary Refill: Capillary refill takes less than 2 seconds.    Neurological:      Mental Status: She is alert and oriented to person, place, and time.         Labs on Admission: I have personally reviewed following labs and imaging studies    CBC:  Recent Labs   Lab 03/22/22  1754   WBC 6.1   NEUTROABS 3.7   HGB 12.4   HCT 38.6   MCV 86.0   PLT 167     Basic Metabolic Panel:  Recent Labs   Lab 03/22/22  1754   NA 135   K 3.2*   CL 102   CO2 24   GLUCOSE 356*   BUN 9   CREATININE 0.71   CALCIUM 9.0     GFR:  Estimated Creatinine Clearance: 72.6 mL/min (by C-G formula based on SCr of 0.71 mg/dL).  Liver Function Tests:  Recent Labs   Lab 03/22/22  1754   AST 28   ALT 22   ALKPHOS 81   BILITOT 0.9   PROT 7.7   ALBUMIN 3.5     Recent Labs   Lab 03/22/22  1754   LIPASE 45     No results for input(s): AMMONIA in the last 168 hours.  Coagulation Profile:  No results for input(s): INR, PROTIME in the last 168 hours.  Cardiac Enzymes:  No results for input(s): CKTOTAL, CKMB, CKMBINDEX, TROPONINI, TROPONINIHS in the last 168 hours.  BNP (last 3 results)  No results for input(s): PROBNP in the last 8760 hours.  HbA1C:  No results for input(s): HGBA1C in the last 72 hours.  CBG:  Recent Labs   Lab 03/22/22  1729   GLUCAP 407*     Lipid Profile:  No results for input(s): CHOL, HDL, LDLCALC, TRIG, CHOLHDL, LDLDIRECT in the last 72 hours.  Thyroid Function Tests:  No results for input(s): TSH, T4TOTAL, FREET4, T3FREE, THYROIDAB in the last 72 hours.  Anemia Panel:  No results for input(s): VITAMINB12, FOLATE, FERRITIN, TIBC, IRON, RETICCTPCT in the last 72 hours.  Urine analysis:  No results found for: COLORURINE, APPEARANCEUR, LABSPEC, PHURINE, GLUCOSEU, HGBUR, BILIRUBINUR, KETONESUR, PROTEINUR, UROBILINOGEN, NITRITE, LEUKOCYTESUR    Radiological Exams on Admission: I have personally reviewed images  CT Abdomen Pelvis W Contrast    Result Date: 03/22/2022  CLINICAL DATA:  Acute abdominal pain.  An intentional  weight loss. EXAM: CT ABDOMEN AND PELVIS WITH CONTRAST TECHNIQUE: Multidetector CT imaging of the abdomen and pelvis was performed using the standard protocol following bolus administration of intravenous contrast. RADIATION DOSE REDUCTION: This exam was performed according to the departmental dose-optimization program which includes automated exposure control, adjustment of the mA and/or kV according to patient size and/or use of iterative reconstruction technique. CONTRAST:  OMNIPAQUE IOHEXOL 300 MG/ML  SOLN COMPARISON:  None Available. FINDINGS: Lower chest: The visualized lung bases are clear. No intra-abdominal free air.  Small free fluid in the pelvis. Hepatobiliary: Fatty liver. Faint hypodense focus in the inferior right lobe of the liver (27/2 and 72/4) is too small to characterize. No intrahepatic biliary dilatation. Cholecystectomy. No retained calcified stone noted in the central CBD. Pancreas: There is an ill-defined 3.6 x 2.5 cm hypodense mass in the uncinate process  of the pancreas. There is mild atrophy of the pancreas with mild dilatation of the main pancreatic duct. There is inflammatory changes and stranding of the mesentery inferior to the pancreas. 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 There may be superimposed pancreatitis. Correlation with pancreatic enzymes recommended. Further evaluation of the pancreatic lesion with pancreatic mass protocol MRI on a nonemergent/outpatient basis recommended. 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 No drainable fluid collection/abscess or pseudocyst. Spleen: Normal in size without focal abnormality. Adrenals/Urinary Tract: The adrenal glands are unremarkable. There is no hydronephrosis on either side. There is symmetric enhancement and excretion of contrast by both kidneys. The  visualized ureters and urinary bladder appear unremarkable. Stomach/Bowel: There is no bowel obstruction or active inflammation. The appendix is normal. Vascular/Lymphatic: Mild aortoiliac atherosclerotic disease. The IVC is unremarkable. No portal venous gas. Small scattered mesenteric lymph nodes. No adenopathy. Reproductive: Hysterectomy.  No adnexal masses. Other: Ventral hernia repair mesh. Small fat containing ventral hernia noted. There is abutment of several loops of small bowel to the anterior hernia repair mesh consistent with adhesions. Musculoskeletal: Degenerative changes of the spine. No acute osseous pathology. IMPRESSION: 1. Findings most concerning for pancreatic malignancy arising in the uncinate process of the pancreas with possible associated acute pancreatitis. Correlation with pancreatic enzymes and further evaluation of the pancreatic mass with MRI on a nonemergent/outpatient basis recommended. 2. Fatty liver. 3. No bowel obstruction. Normal appendix. 4. Aortic Atherosclerosis (ICD10-I70.0). Electronically Signed   By: Elgie Collard M.D.   On: 03/22/2022 21:54      EKG: My personal interpretation of EKG shows: no EKG    Assessment/Plan  Principal Problem:    Pancreatic mass  Active Problems:    Unintentional weight loss    Hyperlipidemia associated with type 2 diabetes mellitus (HCC)    Hypertension associated with diabetes (HCC)    Morbid (severe) obesity due to excess calories (HCC)    Uncontrolled type 2 diabetes mellitus with hyperglycemia, without long-term current use of insulin (HCC)      Assessment and Plan:  * Pancreatic mass  Signed to observation medical bed.  Keep n.p.o. except for sips of clear liquids.  MRCP in the morning.  EDP states they will contact GI for consult.  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  She will ultimately need a biopsy.  Probable outpatient oncology follow-up.    Pancreatic mass is a Acute  illness/condition that poses a threat to life or bodily function.    Unintentional weight loss  Likely due to her pancreatic mass and most likely pancreatic neoplasm.  She is lost about 30 to 40 pounds in the last 3 weeks.    Uncontrolled type 2 diabetes mellitus with hyperglycemia, without long-term current use of insulin (HCC)  Uncontrolled type 2 diabetes.  Serum glucose greater than 300.  Start Lantus insulin and subcu insulin.    Morbid (severe) obesity due to excess calories (HCC)  Chronic.  Current weight is 112 kg, BMI 40.03    Hypertension associated with diabetes (HCC)  Uncontrolled.  Elevated blood pressures in the ER.  We will give 10 mg of IV labetalol x1.  Continue her home meds.  Including Coreg 12.5 mg twice daily, Cardura 4 mg daily, Lasix 20 mg daily, Zestril 10 mg twice daily.    Hyperlipidemia associated with type  2 diabetes mellitus (HCC)  Stable.    DVT prophylaxis: SQ Heparin  Code Status: Full Code  Family Communication: no family at bedside   Disposition Plan: return home   Consults called: EDP to consult GI   Admission status: Observation, Med-Surg    Carollee Herter, DO  [facility]  03/22/2022, 11:52 PM       Electronically signed by Carollee Herter, DO at 03/22/2022 11:52 PM EDT

## 2022-03-22 NOTE — Assessment & Plan Note (Signed)
Associated Problem(s): Uncontrolled type 2 diabetes mellitus with hyperglycemia, without long-term current use of insulin (HCC)  Formatting of this note might be different from the original.  Uncontrolled type 2 diabetes.  Serum glucose greater than 300.  Start Lantus insulin and subcu insulin.  Electronically signed by Carollee Herter, DO at 03/22/2022 11:46 PM EDT

## 2022-03-22 NOTE — Unmapped (Signed)
Formatting of this note is different from the original.  Emergency Medicine Provider Triage Evaluation Note    Lynn Blackwell , a 81 y.o. female  was evaluated in triage.  Pt complains of hyperglycemia.  States over the last few weeks her blood sugars have been elevated than 500.  She has also had generalized abdominal pain and diarrhea for weeks.  No recent antibiotics or travel.  States she has lost 35 pounds unintentionally.  No cough.  Only change was she had a vaccine booster.    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  Appropriate orders placed.  Lynn Blackwell was informed that the remainder of the evaluation will be completed by another provider, this initial triage assessment does not replace that evaluation, and the importance of remaining in the ED until their evaluation is complete.    Hyperglycemia, diarrhea, abdominal pain for weeks    Henderly, Britni A, PA-C  03/22/22 1747    Electronically signed by Mancel Bale, MD at 03/24/2022  8:52 AM EDT

## 2022-03-22 NOTE — Assessment & Plan Note (Signed)
Associated Problem(s): Morbid (severe) obesity due to excess calories (HCC)  Formatting of this note might be different from the original.  Chronic.  Current weight is 112 kg, BMI 40.03  Electronically signed by Carollee Herter, DO at 03/22/2022 11:45 PM EDT

## 2022-03-22 NOTE — Assessment & Plan Note (Signed)
Uncontrolled type 2 diabetes.  Serum glucose greater than 300.  Start Lantus insulin and subcu insulin.

## 2022-03-22 NOTE — ED Provider Triage Note (Signed)
Emergency Medicine Provider Triage Evaluation Note  Sarah Davies , a 81 y.o. female  was evaluated in triage.  Pt complains of hyperglycemia.  States over the last few weeks her blood sugars have been elevated than 500.  She has also had generalized abdominal pain and diarrhea for weeks.  No recent antibiotics or travel.  States she has lost 35 pounds unintentionally.  No cough.  Only change was she had a vaccine booster.  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  Appropriate orders placed.  [name] was informed that the remainder of the evaluation will be completed by another provider, this initial triage assessment does not replace that evaluation, and the importance of remaining in the ED until their evaluation is complete.  Hyperglycemia, diarrhea, abdominal pain for weeks   Anwar Sakata A, PA-C 03/22/22 1747

## 2022-03-22 NOTE — Assessment & Plan Note (Signed)
Chronic.  Current weight is 112 kg, BMI 40.03

## 2022-03-22 NOTE — Assessment & Plan Note (Signed)
Stable

## 2022-03-22 NOTE — ED Provider Notes (Signed)
[facility] DEPT Provider Note   CSN: [id number] Arrival date & time: 03/22/22  1700     History  Chief Complaint  Patient presents with   Hyperglycemia    Sarah Davies is a 81 y.o. female.  The history is provided by the patient and medical records.  Hyperglycemia Associated symptoms: abdominal pain    81 y.o. F presenting to the ED for hyperglycemia.  States since the end of May her blood sugars have been "out of whack".  She states ranging anywhere from 300s to 500s, even while taking her medications as directed.  States over the past month she has lost approximately 37 pounds unintentionally.  She does report her appetite is poor, only able to eat very small amounts before becoming full quickly.  She does report diarrhea usually right after eating.  She has had some nausea but denies vomiting.  She does report upper abdominal discomfort, worse after eating.  She does report feeling generally weak, already ambulates with a cane.  She has not had any falls.  Denies any unexplained fevers.  She does have history of endometrial cancer s/p hysterectomy and bilateral oophorectomy.  Home Medications Prior to Admission medications   Medication Sig Start Date End Date Taking? Authorizing Provider  b complex vitamins tablet Take 1 tablet by mouth daily.    [provider]  calcium carbonate (OS-CAL) 600 MG TABS Take 600 mg by mouth 2 (two) times daily with a meal.    [provider]  carvedilol (COREG) 12.5 MG tablet Take 12.5 mg by mouth 2 (two) times daily with a meal.  03/21/13   [provider]  cholecalciferol (VITAMIN D) 1000 UNITS tablet Take 1,000 Units by mouth daily.    [provider]  co-enzyme Q-10 50 MG capsule Take 50 mg by mouth daily.    [provider]  doxazosin (CARDURA) 4 MG tablet Take 4 mg by mouth at bedtime.  01/19/13   [provider]  doxycycline (VIBRA-TABS) 100 MG tablet Take  1 tablet (100 mg total) by mouth 2 (two) times daily. 02/04/14   Le, Thao P, DO  furosemide (LASIX) 20 MG tablet Take 20 mg by mouth daily.  03/16/13   [provider]  lisinopril (PRINIVIL,ZESTRIL) 10 MG tablet Take 10 mg by mouth 2 (two) times daily.  03/02/13   [provider]  magnesium 30 MG tablet Take 30 mg by mouth daily.    [provider]      Allergies    Nicotine, Other, Molds & smuts, and Shellfish allergy    Review of Systems   Review of Systems  Constitutional:  Positive for unexpected weight change.  Gastrointestinal:  Positive for abdominal pain and diarrhea.  All other systems reviewed and are negative.   Physical Exam Updated Vital Signs BP (!) 195/72   Pulse 66   Temp 98.7 F (37.1 C) (Oral)   Resp 16   Ht '5\' 6"'$  (1.676 m)   Wt 112.5 kg   SpO2 99%   BMI 40.03 kg/m   Physical Exam Vitals and nursing note reviewed.  Constitutional:      Appearance: She is well-developed.     Comments: Seems a little pale  HENT:     Head: Normocephalic and atraumatic.  Eyes:     Conjunctiva/sclera: Conjunctivae normal.     Pupils: Pupils are equal, round, and reactive to light.  Cardiovascular:     Rate and Rhythm: Normal rate and  regular rhythm.     Heart sounds: Normal heart sounds.  Pulmonary:     Effort: Pulmonary effort is normal.     Breath sounds: Normal breath sounds.  Abdominal:     General: Bowel sounds are normal.     Palpations: Abdomen is soft.     Tenderness: There is no abdominal tenderness. There is no guarding or rebound.     Comments: Mildly tender epigastrium without rebound or guarding  Musculoskeletal:        General: Normal range of motion.     Cervical back: Normal range of motion.  Skin:    General: Skin is warm and dry.  Neurological:     Mental Status: She is alert and oriented to person, place, and time.     ED Results / Procedures / Treatments   Labs (all labs ordered are listed, but only abnormal results  are displayed) Labs Reviewed  COMPREHENSIVE METABOLIC PANEL - Abnormal; Notable for the following components:      Result Value   Potassium 3.2 (*)    Glucose, Bld 356 (*)    All other components within normal limits  BLOOD GAS, VENOUS - Abnormal; Notable for the following components:   pCO2, Ven 43 (*)    pO2, Ven 121 (*)    Acid-Base Excess 2.2 (*)    All other components within normal limits  CBG MONITORING, ED - Abnormal; Notable for the following components:   Glucose-Capillary 407 (*)    All other components within normal limits  CBC WITH DIFFERENTIAL/PLATELET  LIPASE, BLOOD  URINALYSIS, ROUTINE W REFLEX MICROSCOPIC  CBG MONITORING, ED    EKG None  Radiology CT Abdomen Pelvis W Contrast  Result Date: 03/22/2022 CLINICAL DATA:  Acute abdominal pain.  An intentional weight loss. EXAM: CT ABDOMEN AND PELVIS WITH CONTRAST TECHNIQUE: Multidetector CT imaging of the abdomen and pelvis was performed using the standard protocol following bolus administration of intravenous contrast. RADIATION DOSE REDUCTION: This exam was performed according to the departmental dose-optimization program which includes automated exposure control, adjustment of the mA and/or kV according to patient size and/or use of iterative reconstruction technique. CONTRAST:  171m OMNIPAQUE IOHEXOL 300 MG/ML  SOLN COMPARISON:  None Available. FINDINGS: Lower chest: The visualized lung bases are clear. No intra-abdominal free air.  Small free fluid in the pelvis. Hepatobiliary: Fatty liver. Faint hypodense focus in the inferior right lobe of the liver (27/2 and 72/4) is too small to characterize. No intrahepatic biliary dilatation. Cholecystectomy. No retained calcified stone noted in the central CBD. Pancreas: There is an ill-defined 3.6 x 2.5 cm hypodense mass in the uncinate process of the pancreas. There is mild atrophy of the pancreas with mild dilatation of the main pancreatic duct. There is inflammatory changes and  stranding of the mesentery inferior to the pancreas. 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 There may be superimposed pancreatitis. Correlation with pancreatic enzymes recommended. Further evaluation of the pancreatic lesion with pancreatic mass protocol MRI on a nonemergent/outpatient basis recommended. 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 No drainable fluid collection/abscess or pseudocyst. Spleen: Normal in size without focal abnormality. Adrenals/Urinary Tract: The adrenal glands are unremarkable. There is no hydronephrosis on either side. There is symmetric enhancement and excretion of contrast by both kidneys. The visualized ureters and urinary bladder appear unremarkable. Stomach/Bowel: There is no bowel obstruction or active  inflammation. The appendix is normal. Vascular/Lymphatic: Mild aortoiliac atherosclerotic disease. The IVC is unremarkable. No portal venous gas. Small scattered mesenteric lymph nodes. No adenopathy. Reproductive: Hysterectomy.  No adnexal masses. Other: Ventral hernia repair mesh. Small fat containing ventral hernia noted. There is abutment of several loops of small bowel to the anterior hernia repair mesh consistent with adhesions. Musculoskeletal: Degenerative changes of the spine. No acute osseous pathology. IMPRESSION: 1. Findings most concerning for pancreatic malignancy arising in the uncinate process of the pancreas with possible associated acute pancreatitis. Correlation with pancreatic enzymes and further evaluation of the pancreatic mass with MRI on a nonemergent/outpatient basis recommended. 2. Fatty liver. 3. No bowel obstruction. Normal appendix. 4. Aortic Atherosclerosis (ICD10-I70.0). Electronically Signed   By: Anner Crete M.D.   On: 03/22/2022 21:54    Procedures Procedures     Medications Ordered in ED Medications  iohexol (OMNIPAQUE) 300 MG/ML solution 100 mL (100 mLs Intravenous Contrast Given 03/22/22 2129)    ED Course/ Medical Decision Making/ A&P                           Medical Decision Making Risk Decision regarding hospitalization.   81 year old female presenting to the ED with abdominal discomfort, unintentional weight loss, and hyperglycemia over the past few weeks.  Estimates 37 pound unintentional weight loss.  No unexplained fevers.  She has had a lot of diarrhea and poor appetite.  She is afebrile, nontoxic in appearance, does appear a bit pale.  She is not jaundiced.  She does have some mild tenderness in her epigastrium without rebound or guarding.  No peritoneal signs.  Labs with hyperglycemia, but no findings of DKA.  Potassium is mildly low at 3.2, likely from GI loss.  Will give supplementation.  CT obtained from triage and reviewed, findings concerning for pancreatic mass, possibly acute pancreatitis.  Lipase is normal.  Results discussed with patient, she is upset as expected.  She does have history of endometrial cancer but no history of pancreatic issues.  No family history of pancreatic cancer.  I feel she will require admission for expedited work-up of this, she is agreeable.  She is followed by GI, Dr. Collene Mares.  Discussed with hospitalist, Dr. Bridgett Larsson-- will admit for ongoing care.    Final Clinical Impression(s) / ED Diagnoses Final diagnoses:  Hyperglycemia  Pancreatic mass    Rx / DC Orders ED Discharge Orders     None         Larene Pickett, PA-C 03/23/22 0009    Maudie Flakes, MD 03/23/22 [id number]

## 2022-03-22 NOTE — Assessment & Plan Note (Signed)
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  She is lost about 30 to 40 pounds in the last 3 weeks.

## 2022-03-22 NOTE — Subjective & Objective (Signed)
Chief complaint: Abdominal pain, weight loss History of present illness: [name], history of type 2 diabetes on glipizide, hypertension, hyperlipidemia, morbid obesity with a BMI of 40 presents to the ER today with a 3-week history of weight loss, 3 weeks of diarrhea, 1 week history of worsening abdominal fullness/tightness.  Patient states that she has been having yellow watery diarrhea.  Denies any blood in her stools.  She has noted a 30 to 40 pound weight loss in the last week.  Her blood sugars also been very high greater than 300 over the last week.  Appetite has been poor.  Patient has not been able to see her PCP as the practice recently changed from [facility] over to [facility].  She has not seen her new physician yet.  On arrival to the ER temp 98.7 heart rate 66 blood pressure 168/84 satting 96% on room air.  Labs showed a sodium 135, potassium 3.2, bicarb of 9, creatinine 0.7, glucose 356  Lipase of 45  White count 6.1, he 112.4, platelets 167  CT abdomen pelvis with IV contrast.  I personally reviewed and interpreted these images.  Shows a new pancreatic mass.  Patient is status postcholecystectomy.  Due to the patient's weight loss and new pancreatic mass, [facility] hospitalist contacted for admission.

## 2022-03-22 NOTE — Assessment & Plan Note (Signed)
Uncontrolled.  Elevated blood pressures in the ER.  We will give 10 mg of IV labetalol x1.  Continue her home meds.  Including Coreg 12.5 mg twice daily, Cardura 4 mg daily, Lasix 20 mg daily, Zestril 10 mg twice daily.

## 2022-03-22 NOTE — ED Triage Notes (Signed)
Patient arrives ambulatory with cane c/o hyperglycemia over the past few weeks. States she does not take insulin and blood sugars have been in the 500s recently. States she has lost weight over the past month.

## 2022-03-22 NOTE — H&P (Addendum)
History and Physical    JACQULYN BARRESI EQA:[id number] DOB: 15-Jan-1941 DOA: 03/22/2022  DOS: the patient was seen and examined on 03/22/2022  PCP: Buzzy Han, MD (Inactive)   Patient coming from: Home  I have personally briefly reviewed patient's old medical records in [facility]  Chief complaint: Abdominal pain, weight loss History of present illness: [name], history of type 2 diabetes on glipizide, hypertension, hyperlipidemia, morbid obesity with a BMI of 40 presents to the ER today with a 3-week history of weight loss, 3 weeks of diarrhea, 1 week history of worsening abdominal fullness/tightness.  Patient states that she has been having yellow watery diarrhea.  Denies any blood in her stools.  She has noted a 30 to 40 pound weight loss in the last week.  Her blood sugars also been very high greater than 300 over the last week.  Appetite has been poor.  Patient has not been able to see her PCP as the practice recently changed from [facility] over to [facility].  She has not seen her new physician yet.  On arrival to the ER temp 98.7 heart rate 66 blood pressure 168/84 satting 96% on room air.  Labs showed a sodium 135, potassium 3.2, bicarb of 9, creatinine 0.7, glucose 356  Lipase of 45  White count 6.1, he 112.4, platelets 167  CT abdomen pelvis with IV contrast.  I personally reviewed and interpreted these images.  Shows a new pancreatic mass.  Patient is status postcholecystectomy.  Due to the patient's weight loss and new pancreatic mass, [facility] hospitalist contacted for admission.   ED Course: CT abdomen pelvis shows new pancreatic mass.  Review of Systems:  Review of Systems  Constitutional:  Positive for malaise/fatigue and weight loss. Negative for chills, diaphoresis and fever.       30 to 40 pound weight loss in 3 weeks.  HENT: Negative.    Eyes: Negative.   Respiratory: Negative.    Cardiovascular: Negative.    Gastrointestinal:  Positive for abdominal pain and diarrhea.  Genitourinary: Negative.   Musculoskeletal: Negative.   Skin: Negative.   Neurological: Negative.   Endo/Heme/Allergies: Negative.   Psychiatric/Behavioral: Negative.    All other systems reviewed and are negative.   Past Medical History:  Diagnosis Date   Allergy    Cataract    Diabetes mellitus without complication ([place])    Hypertension     Past Surgical History:  Procedure Laterality Date   ABDOMINAL HYSTERECTOMY     CHOLECYSTECTOMY     SPINE SURGERY       reports that she has never smoked. She has never used smokeless tobacco. She reports that she does not drink alcohol and does not use drugs.  Allergies  Allergen Reactions   Nicotine Other (See Comments)    Throat closes    Other Other (See Comments)    Joint pain Other reaction(s): Other (See Comments) Animal Dander From when she was tested, it was stimulated.      Shellfish Allergy Other (See Comments)    Has never had a reaction, was just told through allergy testing that she is allergic to shellfish "Throat closes"   Molds & Smuts Other (See Comments)    From when she was tested, it was stimulated.   Other reaction(s): Other (See Comments) From when she was tested, it was stimulated.      History reviewed. No pertinent family history.  Prior to Admission medications   Medication Sig Start Date End  Date Taking? Authorizing Provider  b complex vitamins tablet Take 1 tablet by mouth daily.    [provider]  calcium carbonate (OS-CAL) 600 MG TABS Take 600 mg by mouth 2 (two) times daily with a meal.    [provider]  carvedilol (COREG) 12.5 MG tablet Take 12.5 mg by mouth 2 (two) times daily with a meal.  03/21/13   [provider]  cholecalciferol (VITAMIN D) 1000 UNITS tablet Take 1,000 Units by mouth daily.    [provider]  co-enzyme Q-10 50 MG capsule Take 50 mg by mouth daily.    [provider]  doxazosin (CARDURA) 4 MG tablet Take 4 mg by mouth at bedtime.  01/19/13   [provider]  doxycycline (VIBRA-TABS) 100 MG tablet Take 1 tablet (100 mg total) by mouth 2 (two) times daily. 02/04/14   Le, Thao P, DO  furosemide (LASIX) 20 MG tablet Take 20 mg by mouth daily.  03/16/13   [provider]  lisinopril (PRINIVIL,ZESTRIL) 10 MG tablet Take 10 mg by mouth 2 (two) times daily.  03/02/13   [provider]  magnesium 30 MG tablet Take 30 mg by mouth daily.    [provider]    Physical Exam: Vitals:   03/22/22 1730 03/22/22 1743 03/22/22 2310 03/22/22 2330  BP: (!) 168/84  (!) 195/72 (!) 187/85  Pulse: 66  66 62  Resp: '20  16 13  '$ Temp: 98.7 F (37.1 C)     TempSrc: Oral     SpO2: 96%  99% 99%  Weight:  112.5 kg    Height:  '5\' 6"'$  (1.676 m)      Physical Exam Vitals and nursing note reviewed.  Constitutional:      General: She is not in acute distress.    Appearance: Normal appearance. She is obese. She is not ill-appearing, toxic-appearing or diaphoretic.  HENT:     Head: Normocephalic and atraumatic.     Nose: Nose normal. No rhinorrhea.  Eyes:     General: No scleral icterus. Cardiovascular:     Rate and Rhythm: Normal rate and regular rhythm.  Pulmonary:     Effort: Pulmonary effort is normal. No respiratory distress.     Breath sounds: Normal breath sounds. No wheezing or rales.  Abdominal:     General: Bowel sounds are normal.     Palpations: Abdomen is soft.     Tenderness: There is abdominal tenderness in the epigastric area. There is no guarding or rebound.  Musculoskeletal:     Right lower leg: 1+ Edema present.     Left lower leg: 1+ Edema present.     Comments: Bilateral lower extremity, ankle, pedal edema.  +1.  Skin:    General: Skin is warm and dry.     Capillary Refill: Capillary refill takes less than 2 seconds.  Neurological:     Mental Status: She is alert and oriented to person, place, and time.       Labs on Admission: I have personally reviewed following labs and imaging studies  CBC: Recent Labs  Lab 03/22/22 1754  WBC 6.1  NEUTROABS 3.7  HGB 12.4  HCT 38.6  MCV 86.0  PLT 161   Basic Metabolic Panel: Recent Labs  Lab 03/22/22 1754  NA 135  K 3.2*  CL 102  CO2 24  GLUCOSE 356*  BUN 9  CREATININE 0.71  CALCIUM 9.0   GFR: Estimated Creatinine Clearance: 72.6 mL/min (by C-G formula  based on SCr of 0.71 mg/dL). Liver Function Tests: Recent Labs  Lab 03/22/22 1754  AST 28  ALT 22  ALKPHOS 81  BILITOT 0.9  PROT 7.7  ALBUMIN 3.5   Recent Labs  Lab 03/22/22 1754  LIPASE 45   No results for input(s): "AMMONIA" in the last 168 hours. Coagulation Profile: No results for input(s): "INR", "PROTIME" in the last 168 hours. Cardiac Enzymes: No results for input(s): "CKTOTAL", "CKMB", "CKMBINDEX", "TROPONINI", "TROPONINIHS" in the last 168 hours. BNP (last 3 results) No results for input(s): "PROBNP" in the last 8760 hours. HbA1C: No results for input(s): "HGBA1C" in the last 72 hours. CBG: Recent Labs  Lab 03/22/22 1729  GLUCAP 407*   Lipid Profile: No results for input(s): "CHOL", "HDL", "LDLCALC", "TRIG", "CHOLHDL", "LDLDIRECT" in the last 72 hours. Thyroid Function Tests: No results for input(s): "TSH", "T4TOTAL", "FREET4", "T3FREE", "THYROIDAB" in the last 72 hours. Anemia Panel: No results for input(s): "VITAMINB12", "FOLATE", "FERRITIN", "TIBC", "IRON", "RETICCTPCT" in the last 72 hours. Urine analysis: No results found for: "COLORURINE", "APPEARANCEUR", "LABSPEC", "PHURINE", "GLUCOSEU", "HGBUR", "BILIRUBINUR", "KETONESUR", "PROTEINUR", "UROBILINOGEN", "NITRITE", "LEUKOCYTESUR"  Radiological Exams on Admission: I have personally reviewed images CT Abdomen Pelvis W Contrast  Result Date: 03/22/2022 CLINICAL DATA:  Acute abdominal pain.  An intentional weight loss. EXAM: CT ABDOMEN AND PELVIS WITH CONTRAST TECHNIQUE: Multidetector CT imaging of  the abdomen and pelvis was performed using the standard protocol following bolus administration of intravenous contrast. RADIATION DOSE REDUCTION: This exam was performed according to the departmental dose-optimization program which includes automated exposure control, adjustment of the mA and/or kV according to patient size and/or use of iterative reconstruction technique. CONTRAST:  146m OMNIPAQUE IOHEXOL 300 MG/ML  SOLN COMPARISON:  None Available. FINDINGS: Lower chest: The visualized lung bases are clear. No intra-abdominal free air.  Small free fluid in the pelvis. Hepatobiliary: Fatty liver. Faint hypodense focus in the inferior right lobe of the liver (27/2 and 72/4) is too small to characterize. No intrahepatic biliary dilatation. Cholecystectomy. No retained calcified stone noted in the central CBD. Pancreas: There is an ill-defined 3.6 x 2.5 cm hypodense mass in the uncinate process of the pancreas. There is mild atrophy of the pancreas with mild dilatation of the main pancreatic duct. There is inflammatory changes and stranding of the mesentery inferior to the pancreas. 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 There may be superimposed pancreatitis. Correlation with pancreatic enzymes recommended. Further evaluation of the pancreatic lesion with pancreatic mass protocol MRI on a nonemergent/outpatient basis recommended. 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 No drainable fluid collection/abscess or pseudocyst. Spleen: Normal in size without focal abnormality. Adrenals/Urinary Tract: The adrenal glands are unremarkable. There is no hydronephrosis on either side. There is symmetric enhancement and excretion of contrast by both kidneys. The visualized ureters and urinary bladder appear unremarkable. Stomach/Bowel: There is no bowel  obstruction or active inflammation. The appendix is normal. Vascular/Lymphatic: Mild aortoiliac atherosclerotic disease. The IVC is unremarkable. No portal venous gas. Small scattered mesenteric lymph nodes. No adenopathy. Reproductive: Hysterectomy.  No adnexal masses. Other: Ventral hernia repair mesh. Small fat containing ventral hernia noted. There is abutment of several loops of small bowel to the anterior hernia repair mesh consistent with adhesions. Musculoskeletal: Degenerative changes of the spine. No acute osseous pathology. IMPRESSION: 1. Findings most concerning for pancreatic malignancy arising in the uncinate process of  the pancreas with possible associated acute pancreatitis. Correlation with pancreatic enzymes and further evaluation of the pancreatic mass with MRI on a nonemergent/outpatient basis recommended. 2. Fatty liver. 3. No bowel obstruction. Normal appendix. 4. Aortic Atherosclerosis (ICD10-I70.0). Electronically Signed   By: Anner Crete M.D.   On: 03/22/2022 21:54    EKG: My personal interpretation of EKG shows: no EKG  Assessment/Plan Principal Problem:   Pancreatic mass Active Problems:   Unintentional weight loss   Hyperlipidemia associated with type 2 diabetes mellitus ([place])   Hypertension associated with diabetes ([place])   Morbid (severe) obesity due to excess calories ([place])   Uncontrolled type 2 diabetes mellitus with hyperglycemia, without long-term current use of insulin ([facility])    Assessment and Plan: * Pancreatic mass Signed to observation medical bed.  Keep n.p.o. except for sips of clear liquids.  MRCP in the morning.  EDP states they will contact GI for consult.  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  She will ultimately need a biopsy.  Probable outpatient oncology follow-up.  Pancreatic mass is a Acute illness/condition that poses a threat to life or bodily function.   Unintentional weight loss Likely  due to her pancreatic mass and most likely pancreatic neoplasm.  She is lost about 30 to 40 pounds in the last 3 weeks.  Uncontrolled type 2 diabetes mellitus with hyperglycemia, without long-term current use of insulin ([place]) Uncontrolled type 2 diabetes.  Serum glucose greater than 300.  Start Lantus insulin and subcu insulin.  Morbid (severe) obesity due to excess calories ([facility]) Chronic.  Current weight is 112 kg, BMI 40.03  Hypertension associated with diabetes ([place]) Uncontrolled.  Elevated blood pressures in the ER.  We will give 10 mg of IV labetalol x1.  Continue her home meds.  Including Coreg 12.5 mg twice daily, Cardura 4 mg daily, Lasix 20 mg daily, Zestril 10 mg twice daily.  Hyperlipidemia associated with type 2 diabetes mellitus ([facility]) Stable.   DVT prophylaxis: SQ Heparin Code Status: Full Code Family Communication: no family at bedside  Disposition Plan: return home  Consults called: EDP to consult GI  Admission status: Observation, Med-Surg   Kristopher Oppenheim, DO [facility] 03/22/2022, 11:52 PM

## 2022-03-22 NOTE — Assessment & Plan Note (Addendum)
Signed to observation medical bed.  Keep n.p.o. except for sips of clear liquids.  MRCP in the morning.  EDP states they will contact GI for consult.  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  She will ultimately need a biopsy.  Probable outpatient oncology follow-up.  Pancreatic mass is a Acute illness/condition that poses a threat to life or bodily function.

## 2022-03-23 ENCOUNTER — Encounter (HOSPITAL_COMMUNITY): Payer: Self-pay | Admitting: Internal Medicine

## 2022-03-23 ENCOUNTER — Observation Stay (HOSPITAL_COMMUNITY): Payer: Medicare PPO

## 2022-03-23 DIAGNOSIS — K8689 Other specified diseases of pancreas: Secondary | ICD-10-CM | POA: Diagnosis not present

## 2022-03-23 LAB — COMPREHENSIVE METABOLIC PANEL
ALT: 20 U/L (ref 0–44)
AST: 23 U/L (ref 15–41)
Albumin: 3.1 g/dL — ABNORMAL LOW (ref 3.5–5.0)
Alkaline Phosphatase: 78 U/L (ref 38–126)
Anion gap: 5 (ref 5–15)
BUN: 7 mg/dL — ABNORMAL LOW (ref 8–23)
CO2: 27 mmol/L (ref 22–32)
Calcium: 8.8 mg/dL — ABNORMAL LOW (ref 8.9–10.3)
Chloride: 106 mmol/L (ref 98–111)
Creatinine, Ser: 0.58 mg/dL (ref 0.44–1.00)
GFR, Estimated: >60 mL/min (ref >60)
Glucose, Bld: 245 mg/dL — ABNORMAL HIGH (ref 70–99)
Potassium: 3 mmol/L — ABNORMAL LOW (ref 3.5–5.1)
Sodium: 138 mmol/L (ref 135–145)
Total Bilirubin: 0.8 mg/dL (ref 0.3–1.2)
Total Protein: 6.8 g/dL (ref 6.5–8.1)

## 2022-03-23 LAB — GLUCOSE, CAPILLARY
Glucose-Capillary: 158 mg/dL — ABNORMAL HIGH (ref 70–99)
Glucose-Capillary: 251 mg/dL — ABNORMAL HIGH (ref 70–99)

## 2022-03-23 LAB — HEMOGLOBIN A1C
Hgb A1c MFr Bld: 13 % — ABNORMAL HIGH (ref 4.8–5.6)
Mean Plasma Glucose: 326.4 mg/dL

## 2022-03-23 LAB — URINALYSIS, ROUTINE W REFLEX MICROSCOPIC
Bilirubin Urine: NEGATIVE
Glucose, UA: >=500 mg/dL — AB
Hgb urine dipstick: NEGATIVE
Ketones, ur: 20 mg/dL — AB
Leukocytes,Ua: NEGATIVE
Nitrite: NEGATIVE
Protein, ur: NEGATIVE mg/dL
Specific Gravity, Urine: 1.027 (ref 1.005–1.030)
pH: 5 (ref 5.0–8.0)

## 2022-03-23 LAB — CBC WITH DIFFERENTIAL/PLATELET
Abs Immature Granulocytes: 0.01 10*3/uL (ref 0.00–0.07)
Basophils Absolute: 0 10*3/uL (ref 0.0–0.1)
Basophils Relative: 0 %
Eosinophils Absolute: 0.2 10*3/uL (ref 0.0–0.5)
Eosinophils Relative: 3 %
HCT: 35.5 % — ABNORMAL LOW (ref 36.0–46.0)
Hemoglobin: 11.7 g/dL — ABNORMAL LOW (ref 12.0–15.0)
Immature Granulocytes: 0 %
Lymphocytes Relative: 29 %
Lymphs Abs: 1.8 10*3/uL (ref 0.7–4.0)
MCH: 28.3 pg (ref 26.0–34.0)
MCHC: 33 g/dL (ref 30.0–36.0)
MCV: 85.7 fL (ref 80.0–100.0)
Monocytes Absolute: 0.7 10*3/uL (ref 0.1–1.0)
Monocytes Relative: 11 %
Neutro Abs: 3.5 10*3/uL (ref 1.7–7.7)
Neutrophils Relative %: 57 %
Platelets: 148 10*3/uL — ABNORMAL LOW (ref 150–400)
RBC: 4.14 MIL/uL (ref 3.87–5.11)
RDW: 12.8 % (ref 11.5–15.5)
WBC: 6.3 10*3/uL (ref 4.0–10.5)
nRBC: 0 % (ref 0.0–0.2)

## 2022-03-23 LAB — CBG MONITORING, ED
Glucose-Capillary: 175 mg/dL — ABNORMAL HIGH (ref 70–99)
Glucose-Capillary: 222 mg/dL — ABNORMAL HIGH (ref 70–99)
Glucose-Capillary: 251 mg/dL — ABNORMAL HIGH (ref 70–99)
Glucose-Capillary: 287 mg/dL — ABNORMAL HIGH (ref 70–99)

## 2022-03-23 IMAGING — MR MR ABDOMEN WO/W CM MRCP
20 of 21 series · 46 of 48 positions shown · IV contrast (gadavist)
Comparison: CT evaluation [DATE] of [IF].

CLINICAL DATA: Suspected pancreatic mass in a patient with history
of potential pancreatitis.

EXAM:
MRI ABDOMEN WITHOUT AND WITH CONTRAST (INCLUDING MRCP)
TECHNIQUE: Multiplanar multisequence MR imaging of the abdomen was performed
both before and after the administration of intravenous contrast.
Heavily T2-weighted images of the biliary and pancreatic ducts were
obtained, and three-dimensional MRCP images were rendered by post
processing.
CONTRAST:  10mL GADAVIST GADOBUTROL 1 MMOL/ML IV SOLN

[Series 3: DWI · axial · 6.0mm · 1.59mm/px · z∈[-119,+176]mm · 2 of 84 slices shown (1 of 2)]
[im 1/84]
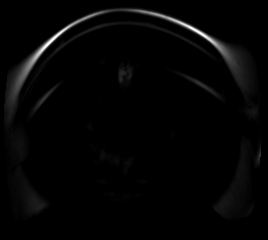
[im 84/84]
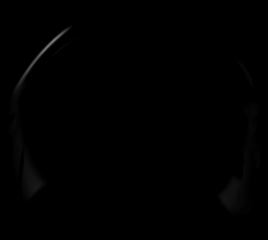

[Series 4: DWI · axial · 6.0mm · 1.59mm/px · 1 of 42 slices shown (2 of 2)]
[im 1/42]
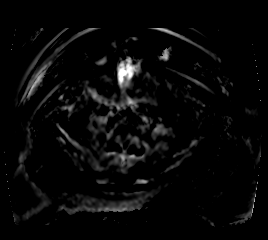

[Series 5: T2 fat-sat · axial · 6.0mm · 1.33mm/px · 1 of 42 slices shown]
[im 1/42]
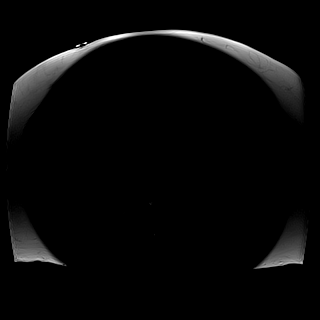

[Series 7: cor_3d_spc_trig-resp · 1 of 5 slices shown]
[im 1/5]
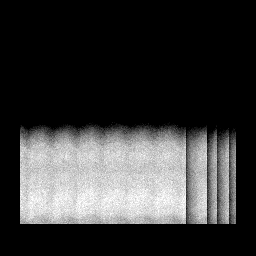

[Series 8: cor_3d_spc_trig · coronal · 1.0mm · 0.53mm/px · 2 of 72 slices shown]
[im 1/72]
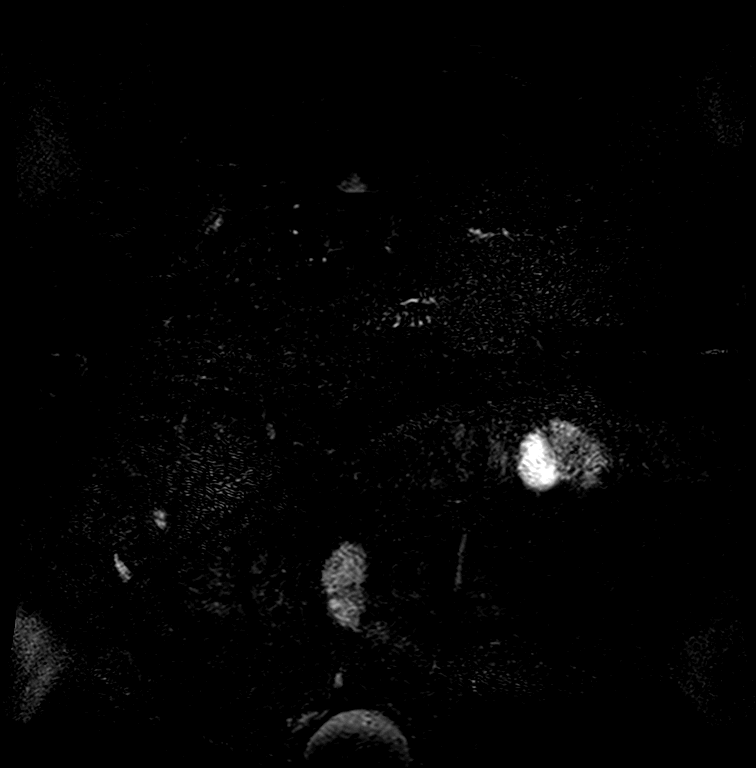
[im 72/72]
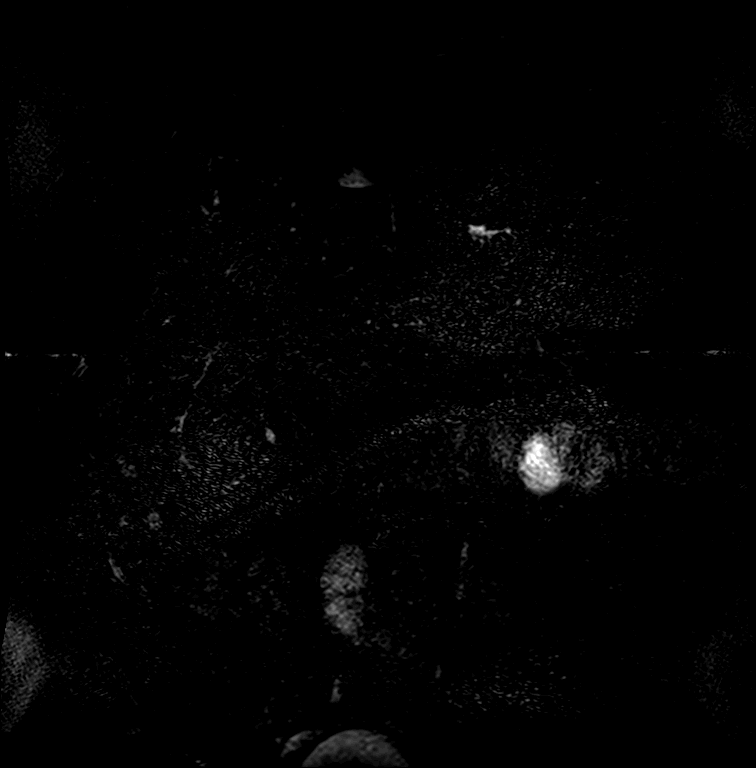

[Series 10: T2 · coronal · 6.0mm · 1.70mm/px · 1 of 40 slices shown (1 of 2)]
[im 1/40]
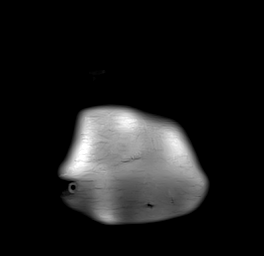

[Series 11: T1 · axial · 3.6mm · 1.48mm/px · z∈[-105,+180]mm · 2 of 80 slices shown (1 of 2)]
[im 1/80]
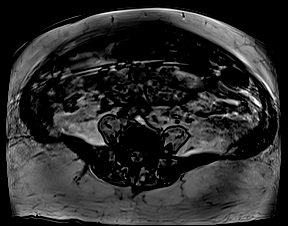
[im 80/80]
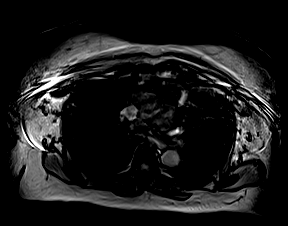

[Series 12: T1 · axial · 3.6mm · 1.48mm/px · z∈[-105,+180]mm · 3 of 80 slices shown (2 of 2)]
[im 1/80]
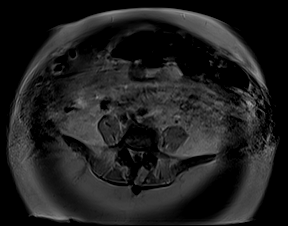
[im 40/80]
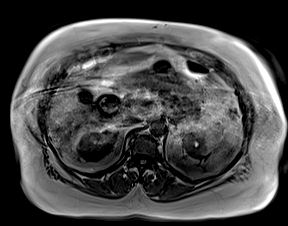
[im 80/80]
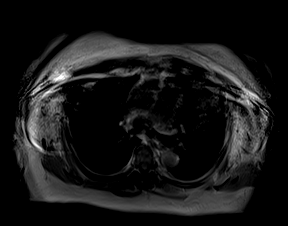

[Series 14: T2 · axial · 6.0mm · 1.66mm/px · z∈[-124,+172]mm · 2 of 42 slices shown (2 of 2)]
[im 1/42]
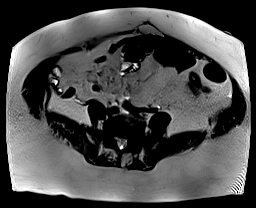
[im 42/42]
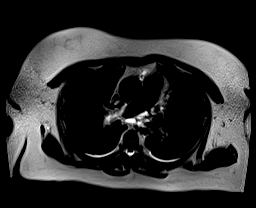

[Series 16: cor obl thk · sagittal · 50.0mm · 0.78mm/px · 1 of 9 slices shown]
[im 1/9]
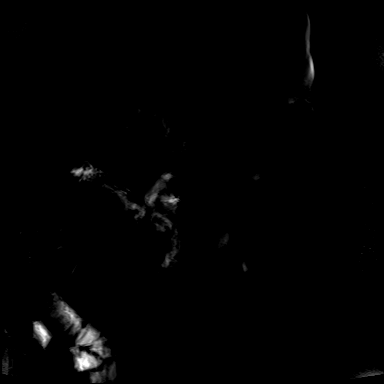

[Series 19: T1 dynamic · axial · 3.8mm · 1.56mm/px · z∈[-114,+186]mm · 3 of 80 slices shown (1 of 10)]
[im 1/80]
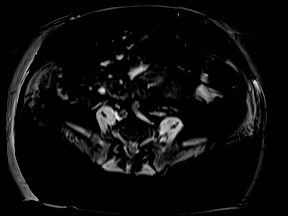
[im 40/80]
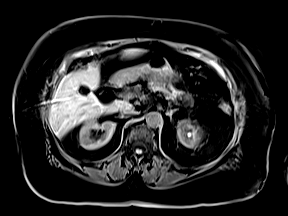
[im 80/80]
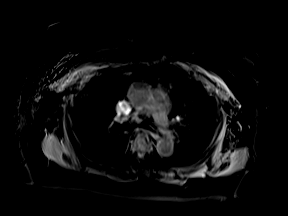

[Series 23: T1 dynamic · axial · 3.8mm · 1.56mm/px · z∈[-114,+186]mm · 3 of 80 slices shown (2 of 10)]
[im 1/80]
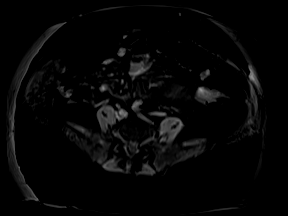
[im 40/80]
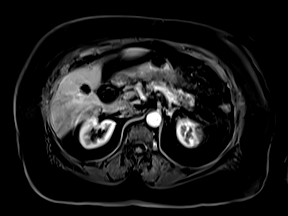
[im 80/80]
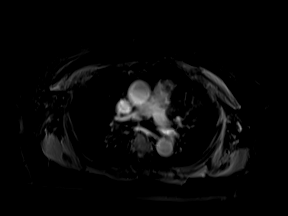

[Series 24: T1 dynamic · axial · 3.8mm · 1.56mm/px · z∈[-114,+186]mm · 3 of 80 slices shown (3 of 10)]
[im 1/80]
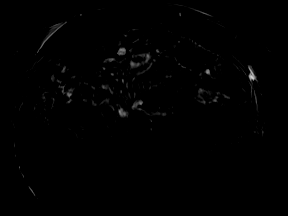
[im 40/80]
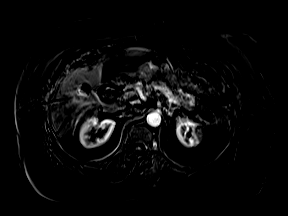
[im 80/80]
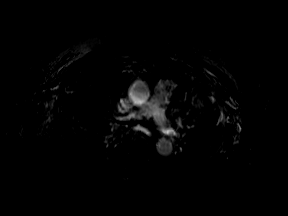

[Series 27: T1 dynamic · axial · 3.8mm · 1.56mm/px · z∈[-114,+186]mm · 3 of 80 slices shown (4 of 10)]
[im 1/80]
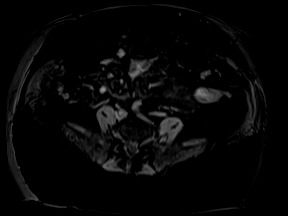
[im 40/80]
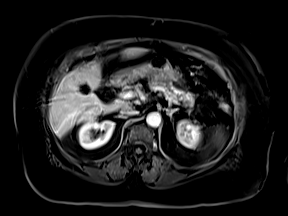
[im 80/80]
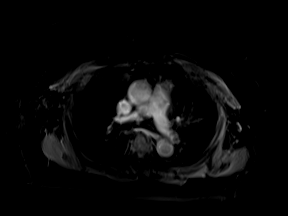

[Series 28: T1 dynamic · axial · 3.8mm · 1.56mm/px · z∈[-114,+186]mm · 3 of 80 slices shown (5 of 10)]
[im 1/80]
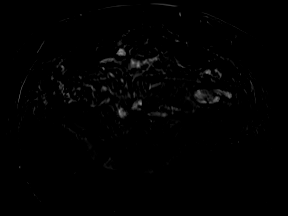
[im 40/80]
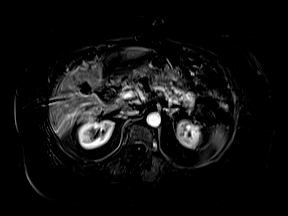
[im 80/80]
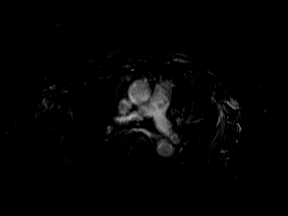

[Series 31: T1 dynamic · axial · 3.8mm · 1.56mm/px · z∈[-114,+186]mm · 3 of 80 slices shown (6 of 10)]
[im 1/80]
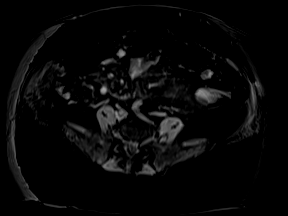
[im 40/80]
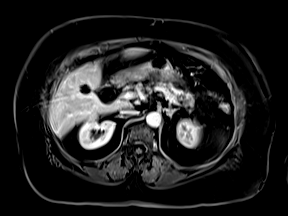
[im 80/80]
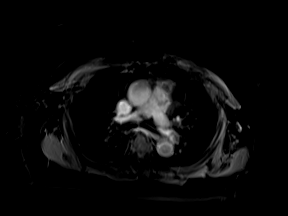

[Series 32: T1 dynamic · axial · 3.8mm · 1.56mm/px · z∈[-114,+186]mm · 3 of 80 slices shown (7 of 10)]
[im 1/80]
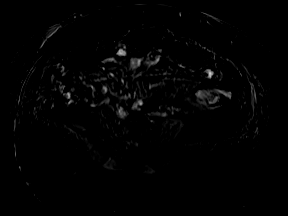
[im 40/80]
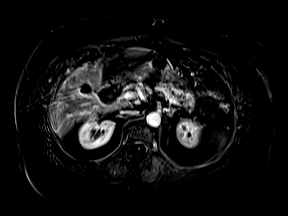
[im 80/80]
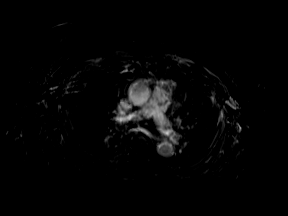

[Series 34: T1 dynamic · coronal · 5.0mm · 1.56mm/px · 3 of 72 slices shown (8 of 10)]
[im 1/72]
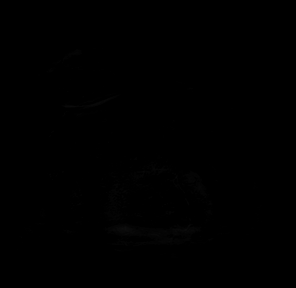
[im 36/72]
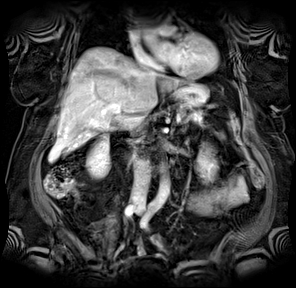
[im 72/72]
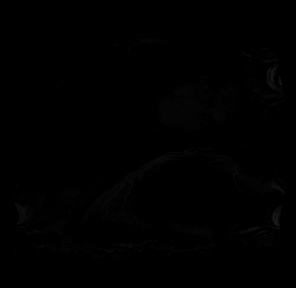

[Series 37: T1 dynamic · axial · 3.8mm · 1.56mm/px · z∈[-114,+186]mm · 3 of 80 slices shown (9 of 10)]
[im 1/80]
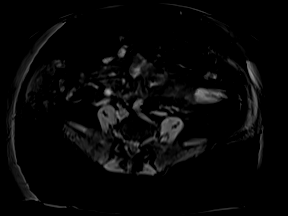
[im 40/80]
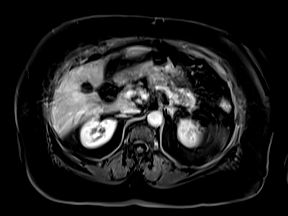
[im 80/80]
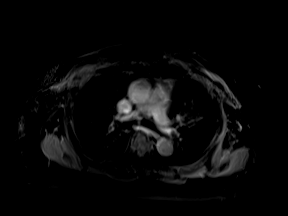

[Series 38: T1 dynamic · axial · 3.8mm · 1.56mm/px · z∈[-114,+186]mm · 3 of 80 slices shown (10 of 10)]
[im 1/80]
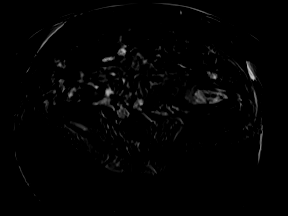
[im 40/80]
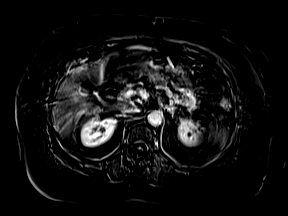
[im 80/80]
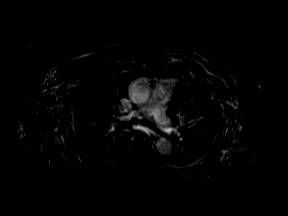

[46 of 48 positions shown; findings below may reference images not displayed]

FINDINGS: Lower chest: Incidental imaging of the lung bases is unremarkable.
Limited assessment on MRI.

Hepatobiliary: Subtle hepatic lesion in the RIGHT hepatic lobe,
hepatic subsegment V/VI (image [DATE]) also visible on
diffusion-weighted imaging. Area not well assessed on post-contrast
imaging due to motion related artifact. Common bile duct is less
than a cm greatest caliber in the setting of prior cholecystectomy
is mildly irregular. Some narrowing distally but less narrowing that
is seen of the main pancreatic duct which shows a transition in the
pancreatic head above the suspected mass in the pancreas, see below.

Intrahepatic biliary ducts are moderately dilated again with changes
of cholecystectomy.

Pancreas: Pancreatic mass centered in the uncinate abutting the SMV
with distortion of the SMV, less than 180 degree involvement by MR
but with limited assessment with respect to this portion of the
evaluation due to motion and susceptibility effects from adjacent
bowel.

Mass displays hypovascular features and measures 3.3 x 2.7 cm mainly
in the uncinate process. Pancreatic duct is narrowed passing above
this mass towards the ampulla and is dilated peripherally with
cystic areas that are unchanged in the very short interval compared
to the prior CT.

Small adjacent lymph nodes are present in the small bowel mesentery
and there is diffuse mesenteric edema and thickening which is
nonspecific

Spleen:  Normal.

Adrenals/Urinary Tract:  Adrenal glands are normal.

Symmetric renal enhancement without hydronephrosis.

Stomach/Bowel: No acute gastrointestinal process to the extent
evaluated on abdominal MRI.

Vascular/Lymphatic: Scattered lymph nodes without pathologic
enlargement but with some immediately adjacent to the masslike area
in the head of the pancreas, within the small bowel mesentery.

Other:  No ascites

Musculoskeletal: No suspicious bone lesions identified.
IMPRESSION: Motion limited assessment with hypoenhancing mass in the uncinate
process/inferior head of the pancreas with pancreatic ductal
narrowing/obstruction and early involvement of the distal common
bile duct. Findings are most suspicious for pancreatic
adenocarcinoma. Endoscopic assessment may be helpful to establish
diagnosis.

Subtle hepatic lesion in the RIGHT hepatic lobe, enhancement
characteristics cannot be assessed but this does raise the question
of hepatic involvement.

Vascular involvement is difficult to assess on MR and on the
previous CT, subsequent staging with dedicated pancreatic protocol
may be helpful. There is at least abutment of the SMV.

Hazy mesentery may reflect concomitant mesenteritis or lymphatic
congestion. Small lymph nodes adjacent to the masslike area in the
head of the pancreas warrant close attention on follow-up and or
consideration for evaluation with PET depending on further workup.

## 2022-03-23 IMAGING — MR MR 3D RECON AT SCANNER
19 of 20 series · 46 of 48 positions shown · IV contrast (10 GADAVIST)
Comparison: CT evaluation [DATE] of [IF].

CLINICAL DATA: Suspected pancreatic mass in a patient with history
of potential pancreatitis.

EXAM:
MRI ABDOMEN WITHOUT AND WITH CONTRAST (INCLUDING MRCP)
TECHNIQUE: Multiplanar multisequence MR imaging of the abdomen was performed
both before and after the administration of intravenous contrast.
Heavily T2-weighted images of the biliary and pancreatic ducts were
obtained, and three-dimensional MRCP images were rendered by post
processing.
CONTRAST:  10mL GADAVIST GADOBUTROL 1 MMOL/ML IV SOLN

[Series 3: DWI · axial · 6.0mm · 1.59mm/px · z∈[-119,+176]mm · 2 of 84 slices shown (1 of 2)]
[im 1/84]
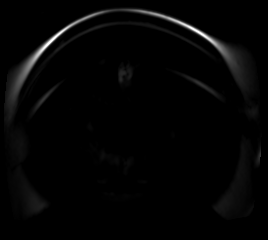
[im 84/84]
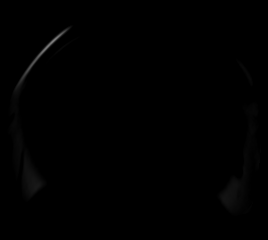

[Series 4: DWI · axial · 6.0mm · 1.59mm/px · 1 of 42 slices shown (2 of 2)]
[im 1/42]
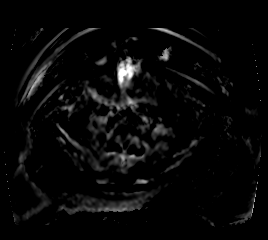

[Series 5: T2 fat-sat · axial · 6.0mm · 1.33mm/px · 1 of 42 slices shown]
[im 1/42]
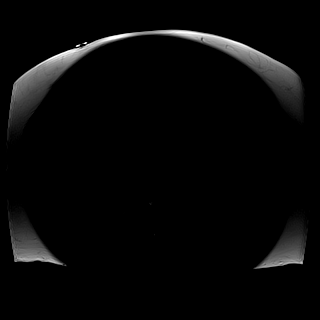

[Series 8: cor_3d_spc_trig · coronal · 1.0mm · 0.53mm/px · 2 of 72 slices shown]
[im 1/72]
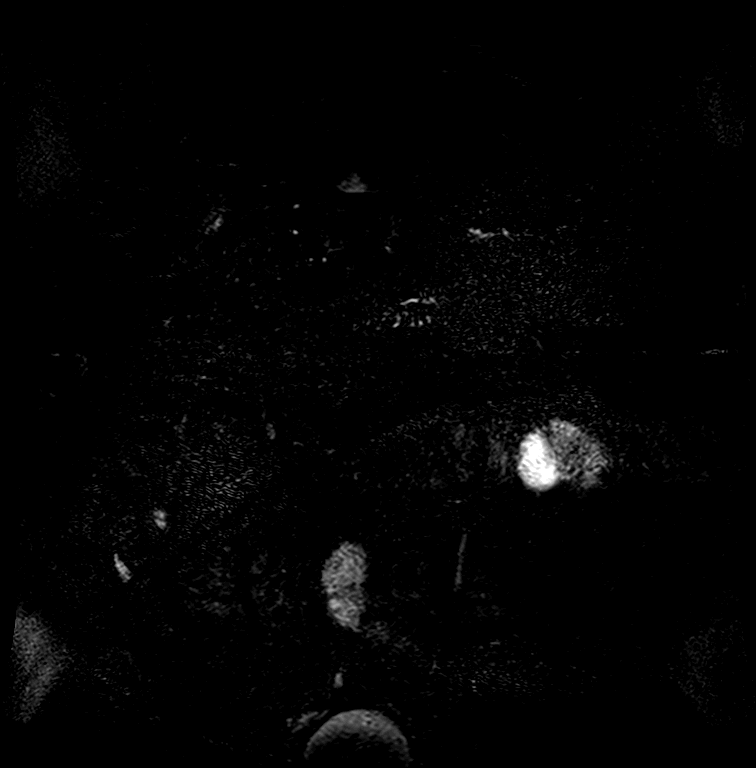
[im 72/72]
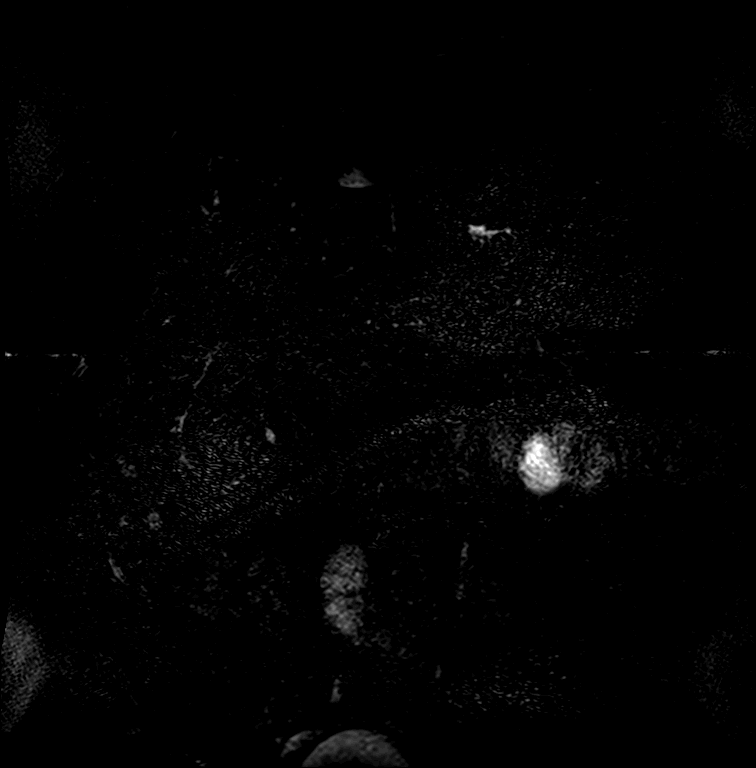

[Series 10: T2 · coronal · 6.0mm · 1.70mm/px · 1 of 40 slices shown (1 of 2)]
[im 1/40]
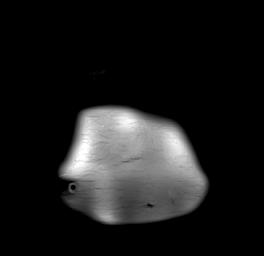

[Series 11: T1 · axial · 3.6mm · 1.48mm/px · z∈[-105,+180]mm · 3 of 80 slices shown (1 of 2)]
[im 1/80]
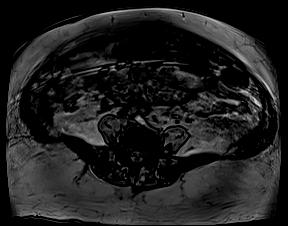
[im 40/80]
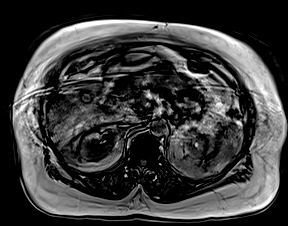
[im 80/80]
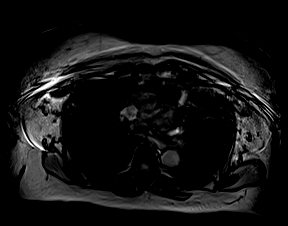

[Series 12: T1 · axial · 3.6mm · 1.48mm/px · z∈[-105,+180]mm · 3 of 80 slices shown (2 of 2)]
[im 1/80]
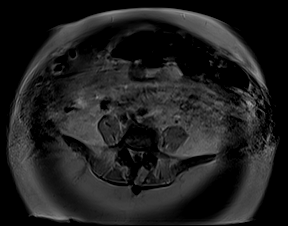
[im 40/80]
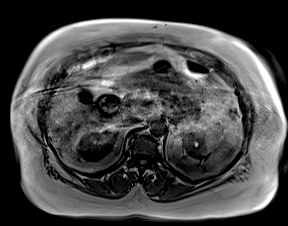
[im 80/80]
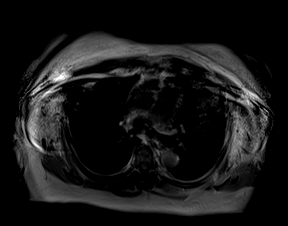

[Series 14: T2 · axial · 6.0mm · 1.66mm/px · z∈[-124,+172]mm · 2 of 42 slices shown (2 of 2)]
[im 1/42]
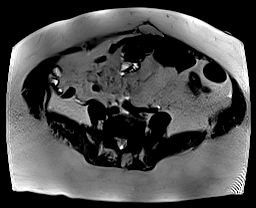
[im 42/42]
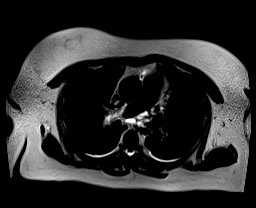

[Series 16: cor obl thk · sagittal · 50.0mm · 0.78mm/px · 1 of 9 slices shown]
[im 1/9]
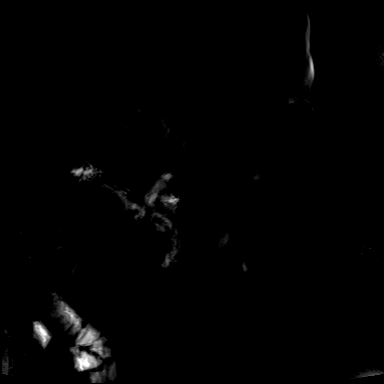

[Series 19: T1 dynamic · axial · 3.8mm · 1.56mm/px · z∈[-114,+186]mm · 3 of 80 slices shown (1 of 10)]
[im 1/80]
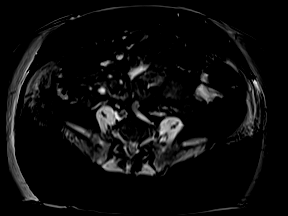
[im 40/80]
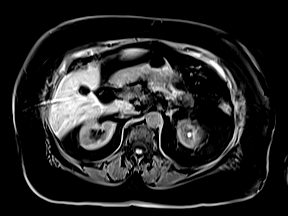
[im 80/80]
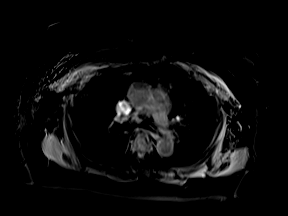

[Series 23: T1 dynamic · axial · 3.8mm · 1.56mm/px · z∈[-114,+186]mm · 3 of 80 slices shown (2 of 10)]
[im 1/80]
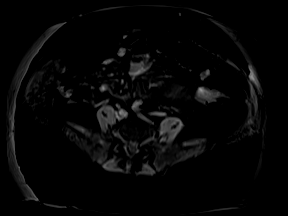
[im 40/80]
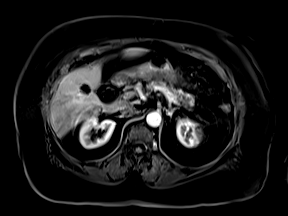
[im 80/80]
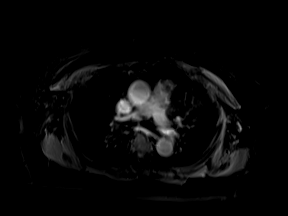

[Series 24: T1 dynamic · axial · 3.8mm · 1.56mm/px · z∈[-114,+186]mm · 3 of 80 slices shown (3 of 10)]
[im 1/80]
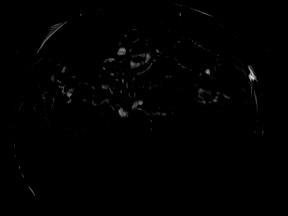
[im 40/80]
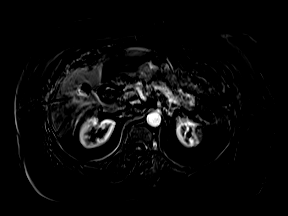
[im 80/80]
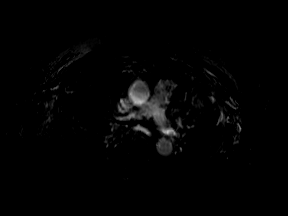

[Series 27: T1 dynamic · axial · 3.8mm · 1.56mm/px · z∈[-114,+186]mm · 3 of 80 slices shown (4 of 10)]
[im 1/80]
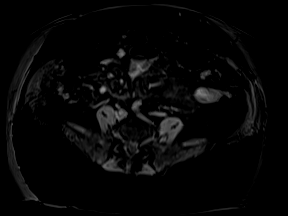
[im 40/80]
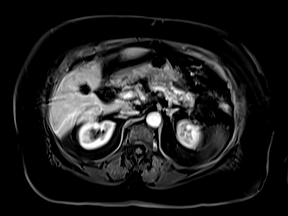
[im 80/80]
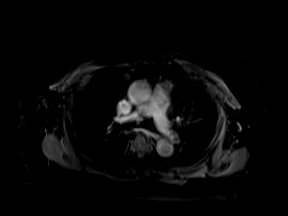

[Series 28: T1 dynamic · axial · 3.8mm · 1.56mm/px · z∈[-114,+186]mm · 3 of 80 slices shown (5 of 10)]
[im 1/80]
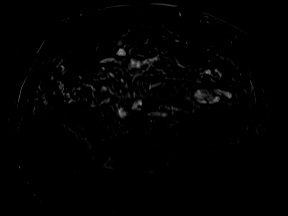
[im 40/80]
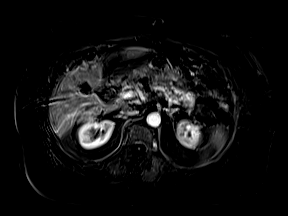
[im 80/80]
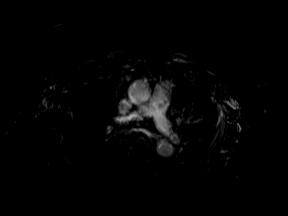

[Series 31: T1 dynamic · axial · 3.8mm · 1.56mm/px · z∈[-114,+186]mm · 3 of 80 slices shown (6 of 10)]
[im 1/80]
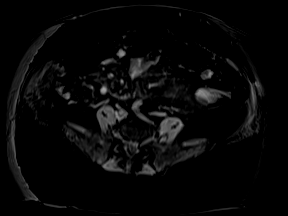
[im 40/80]
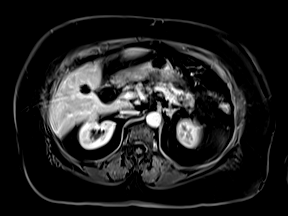
[im 80/80]
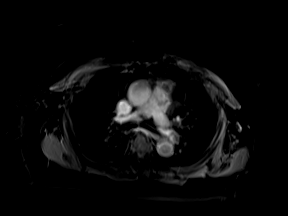

[Series 32: T1 dynamic · axial · 3.8mm · 1.56mm/px · z∈[-114,+186]mm · 3 of 80 slices shown (7 of 10)]
[im 1/80]
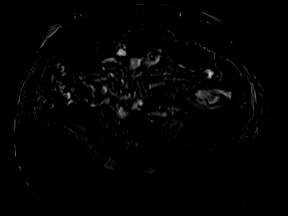
[im 40/80]
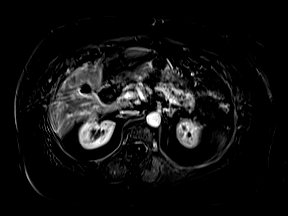
[im 80/80]
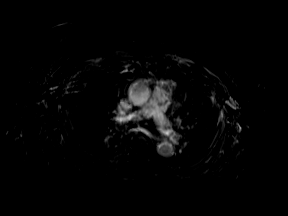

[Series 34: T1 dynamic · coronal · 5.0mm · 1.56mm/px · 3 of 72 slices shown (8 of 10)]
[im 1/72]
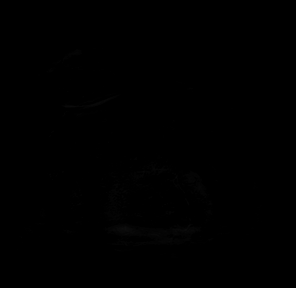
[im 36/72]
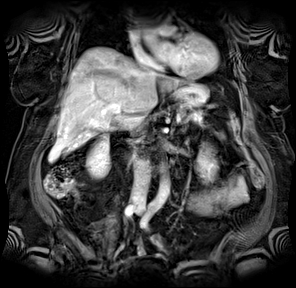
[im 72/72]
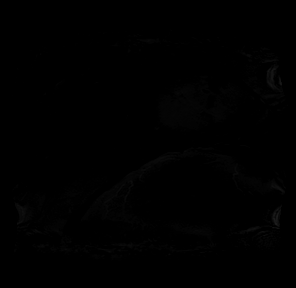

[Series 37: T1 dynamic · axial · 3.8mm · 1.56mm/px · z∈[-114,+186]mm · 3 of 80 slices shown (9 of 10)]
[im 1/80]
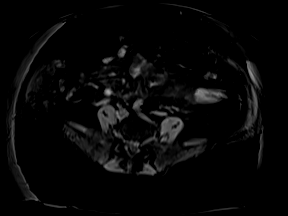
[im 40/80]
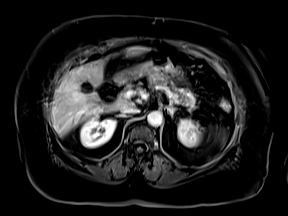
[im 80/80]
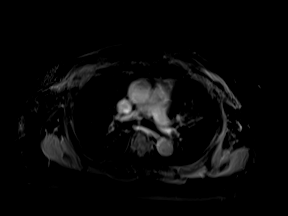

[Series 38: T1 dynamic · axial · 3.8mm · 1.56mm/px · z∈[-114,+186]mm · 3 of 80 slices shown (10 of 10)]
[im 1/80]
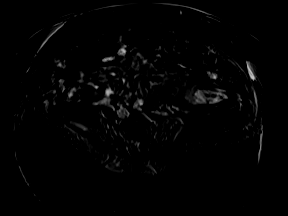
[im 40/80]
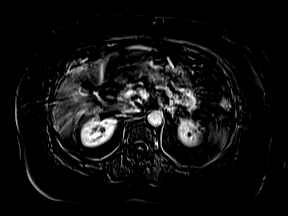
[im 80/80]
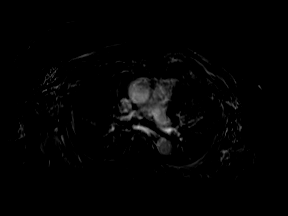

[46 of 48 positions shown; findings below may reference images not displayed]

FINDINGS: Lower chest: Incidental imaging of the lung bases is unremarkable.
Limited assessment on MRI.

Hepatobiliary: Subtle hepatic lesion in the RIGHT hepatic lobe,
hepatic subsegment V/VI (image [DATE]) also visible on
diffusion-weighted imaging. Area not well assessed on post-contrast
imaging due to motion related artifact. Common bile duct is less
than a cm greatest caliber in the setting of prior cholecystectomy
is mildly irregular. Some narrowing distally but less narrowing that
is seen of the main pancreatic duct which shows a transition in the
pancreatic head above the suspected mass in the pancreas, see below.

Intrahepatic biliary ducts are moderately dilated again with changes
of cholecystectomy.

Pancreas: Pancreatic mass centered in the uncinate abutting the SMV
with distortion of the SMV, less than 180 degree involvement by MR
but with limited assessment with respect to this portion of the
evaluation due to motion and susceptibility effects from adjacent
bowel.

Mass displays hypovascular features and measures 3.3 x 2.7 cm mainly
in the uncinate process. Pancreatic duct is narrowed passing above
this mass towards the ampulla and is dilated peripherally with
cystic areas that are unchanged in the very short interval compared
to the prior CT.

Small adjacent lymph nodes are present in the small bowel mesentery
and there is diffuse mesenteric edema and thickening which is
nonspecific

Spleen:  Normal.

Adrenals/Urinary Tract:  Adrenal glands are normal.

Symmetric renal enhancement without hydronephrosis.

Stomach/Bowel: No acute gastrointestinal process to the extent
evaluated on abdominal MRI.

Vascular/Lymphatic: Scattered lymph nodes without pathologic
enlargement but with some immediately adjacent to the masslike area
in the head of the pancreas, within the small bowel mesentery.

Other:  No ascites

Musculoskeletal: No suspicious bone lesions identified.
IMPRESSION: Motion limited assessment with hypoenhancing mass in the uncinate
process/inferior head of the pancreas with pancreatic ductal
narrowing/obstruction and early involvement of the distal common
bile duct. Findings are most suspicious for pancreatic
adenocarcinoma. Endoscopic assessment may be helpful to establish
diagnosis.

Subtle hepatic lesion in the RIGHT hepatic lobe, enhancement
characteristics cannot be assessed but this does raise the question
of hepatic involvement.

Vascular involvement is difficult to assess on MR and on the
previous CT, subsequent staging with dedicated pancreatic protocol
may be helpful. There is at least abutment of the SMV.

Hazy mesentery may reflect concomitant mesenteritis or lymphatic
congestion. Small lymph nodes adjacent to the masslike area in the
head of the pancreas warrant close attention on follow-up and or
consideration for evaluation with PET depending on further workup.

## 2022-03-23 MED ORDER — ONDANSETRON HCL 4 MG PO TABS: 4.0000 mg | ORAL_TABLET | Freq: Four times a day (QID) | ORAL | Status: DC | PRN

## 2022-03-23 MED ORDER — PANCRELIPASE (LIP-PROT-AMYL) 36000-114000 UNITS PO CPEP
36000.0000 [IU] | ORAL_CAPSULE | Freq: Three times a day (TID) | ORAL | Status: DC
  Administered 2022-03-23 – 2022-03-24 (×3): 36000 [IU] via ORAL
  Filled 2022-03-23 (×5): qty 1

## 2022-03-23 MED ORDER — CARVEDILOL 25 MG PO TABS
25.0000 mg | ORAL_TABLET | Freq: Two times a day (BID) | ORAL | Status: DC
  Administered 2022-03-23 – 2022-03-24 (×3): 25 mg via ORAL
  Filled 2022-03-23: qty 2
  Filled 2022-03-23 (×2): qty 1

## 2022-03-23 MED ORDER — SODIUM CHLORIDE 0.9 % IV SOLN: INTRAVENOUS | Status: DC

## 2022-03-23 MED ORDER — HYDRALAZINE HCL 20 MG/ML IJ SOLN
10.0000 mg | Freq: Four times a day (QID) | INTRAMUSCULAR | Status: DC | PRN
Start: 2022-03-23 — End: 2022-03-24

## 2022-03-23 MED ORDER — LISINOPRIL 20 MG PO TABS
20.0000 mg | ORAL_TABLET | Freq: Every day | ORAL | Status: DC
  Administered 2022-03-23 – 2022-03-24 (×2): 20 mg via ORAL
  Filled 2022-03-23: qty 1
  Filled 2022-03-23: qty 2

## 2022-03-23 MED ORDER — MELATONIN 5 MG PO TABS
10.0000 mg | ORAL_TABLET | Freq: Every evening | ORAL | Status: DC | PRN
Start: 2022-03-23 — End: 2022-03-24

## 2022-03-23 MED ORDER — ACETAMINOPHEN 325 MG PO TABS
650.0000 mg | ORAL_TABLET | Freq: Four times a day (QID) | ORAL | Status: DC | PRN
  Administered 2022-03-24: 650 mg via ORAL
  Filled 2022-03-23: qty 2

## 2022-03-23 MED ORDER — ACETAMINOPHEN 650 MG RE SUPP: 650.0000 mg | Freq: Four times a day (QID) | RECTAL | Status: DC | PRN

## 2022-03-23 MED ORDER — FUROSEMIDE 20 MG PO TABS
20.0000 mg | ORAL_TABLET | Freq: Every day | ORAL | Status: DC
  Administered 2022-03-23 – 2022-03-24 (×2): 20 mg via ORAL
  Filled 2022-03-23 (×2): qty 1

## 2022-03-23 MED ORDER — LORAZEPAM 2 MG/ML IJ SOLN
1.0000 mg | Freq: Once | INTRAMUSCULAR | Status: AC
Start: 2022-03-23 — End: 2022-03-23
  Administered 2022-03-23: 1 mg via INTRAVENOUS
  Filled 2022-03-23: qty 1

## 2022-03-23 MED ORDER — INSULIN GLARGINE-YFGN 100 UNIT/ML ~~LOC~~ SOLN
10.0000 [IU] | Freq: Every day | SUBCUTANEOUS | Status: DC
  Administered 2022-03-23 (×2): 10 [IU] via SUBCUTANEOUS
  Filled 2022-03-23 (×3): qty 0.1

## 2022-03-23 MED ORDER — HEPARIN SODIUM (PORCINE) 5000 UNIT/ML IJ SOLN
5000.0000 [IU] | Freq: Three times a day (TID) | INTRAMUSCULAR | Status: DC
  Filled 2022-03-23: qty 1

## 2022-03-23 MED ORDER — ONDANSETRON HCL 4 MG/2ML IJ SOLN: 4.0000 mg | Freq: Four times a day (QID) | INTRAMUSCULAR | Status: DC | PRN

## 2022-03-23 MED ORDER — POTASSIUM CHLORIDE CRYS ER 20 MEQ PO TBCR
40.0000 meq | EXTENDED_RELEASE_TABLET | Freq: Once | ORAL | Status: AC
  Administered 2022-03-23: 40 meq via ORAL
  Filled 2022-03-23: qty 2

## 2022-03-23 MED ORDER — GADOBUTROL 1 MMOL/ML IV SOLN
10.0000 mL | Freq: Once | INTRAVENOUS | Status: AC | PRN
Start: 2022-03-23 — End: 2022-03-23
  Administered 2022-03-23: 10 mL via INTRAVENOUS

## 2022-03-23 MED ORDER — DOXAZOSIN MESYLATE 4 MG PO TABS
4.0000 mg | ORAL_TABLET | Freq: Every day | ORAL | Status: DC
  Administered 2022-03-23 (×2): 4 mg via ORAL
  Filled 2022-03-23 (×3): qty 1

## 2022-03-23 MED ORDER — HEPARIN SODIUM (PORCINE) 5000 UNIT/ML IJ SOLN
5000.0000 [IU] | Freq: Three times a day (TID) | INTRAMUSCULAR | Status: DC
  Administered 2022-03-23 – 2022-03-24 (×4): 5000 [IU] via SUBCUTANEOUS
  Filled 2022-03-23 (×5): qty 1

## 2022-03-23 NOTE — ED Notes (Signed)
Formatting of this note might be different from the original.  Patient ambulatory to restroom with cane. Attending MD in room at this time.   Electronically signed by Duwaine Maxin, RN at 03/23/2022  8:10 AM EDT

## 2022-03-23 NOTE — Progress Notes (Signed)
Formatting of this note might be different from the original.  Patient arrived to room 1610 in NAD, VS stable and patient free from pain. Patient oriented to room and call bell in reach.   Electronically signed by Melany Guernsey, RN at 03/23/2022  3:44 PM EDT

## 2022-03-23 NOTE — Unmapped (Signed)
Formatting of this note is different from the original.  Reason for Consult: Pancreatic mass  Referring Physician: [facility]    Lynn Blackwell  HPI: This is a 81 year old female with a PMH of HTN, HLD, DM, and morbid obesity admitted for complaints of abdominal pain and unintentional weight loss.  The patient reports losing 37 lbs over the past two months and this was associated with diarrhea as well as abdominal pain.  Her pain started gradually two months ago and she presented to the ER because of her blood glucose.  She was struggling with hyperglycemia.  Imaging of the abdomen showed 3.5 cm mass in the uncinate process of the pancreas.  Her liver enzymes were normal, but her blood glucose was elevated.    Past Medical History:   Diagnosis Date    Allergy     Cataract     Diabetes mellitus without complication (HCC)     Hypertension      Past Surgical History:   Procedure Laterality Date    ABDOMINAL HYSTERECTOMY      CHOLECYSTECTOMY      SPINE SURGERY       History reviewed. No pertinent family history.    Social History:  reports that she has never smoked. She has never used smokeless tobacco. She reports that she does not drink alcohol and does not use drugs.    Allergies:   Allergies   Allergen Reactions    Nicotine Other (See Comments)     Throat closes     Other Other (See Comments)     Joint pain  Other reaction(s): Other (See Comments)  Animal Dander  From when she was tested, it was stimulated.       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    Other reaction(s): Other (See Comments)  From when she was tested, it was stimulated.      Statins Other (See Comments)     Muscle aches     Medications: Scheduled:   carvedilol  25 mg Oral BID WC    doxazosin  4 mg Oral QHS    furosemide  20 mg Oral  Daily    heparin  5,000 Units Subcutaneous Q8H    insulin aspart  0-15 Units Subcutaneous TID WC    insulin aspart  0-5 Units Subcutaneous QHS    insulin glargine-yfgn  10 Units Subcutaneous QHS    lisinopril  20 mg Oral Daily     Continuous:    Results for orders placed or performed during the hospital encounter of 03/22/22 (from the past 24 hour(s))   CBG monitoring, ED     Status: Abnormal    Collection Time: 03/22/22  5:29 PM   Result Value Ref Range    Glucose-Capillary 407 (H) 70 - 99 mg/dL    Comment 1 Notify RN    Urinalysis, Routine w reflex microscopic Urine, Clean Catch     Status: Abnormal    Collection Time: 03/22/22  5:46 PM   Result Value Ref Range    Color, Urine YELLOW YELLOW    APPearance CLEAR CLEAR    Specific Gravity, Urine 1.027 1.005 - 1.030    pH 5.0 5.0 - 8.0    Glucose,  UA >=500 (A) NEGATIVE mg/dL    Hgb urine dipstick NEGATIVE NEGATIVE    Bilirubin Urine NEGATIVE NEGATIVE    Ketones, ur 20 (A) NEGATIVE mg/dL    Protein, ur NEGATIVE NEGATIVE mg/dL    Nitrite NEGATIVE NEGATIVE    Leukocytes,Ua NEGATIVE NEGATIVE    RBC / HPF 0-5 0 - 5 RBC/hpf    WBC, UA 0-5 0 - 5 WBC/hpf    Bacteria, UA RARE (A) NONE SEEN    Squamous Epithelial / LPF 0-5 0 - 5    Mucus PRESENT    CBC with Differential     Status: None    Collection Time: 03/22/22  5:54 PM   Result Value Ref Range    WBC 6.1 4.0 - 10.5 K/uL    RBC 4.49 3.87 - 5.11 MIL/uL    Hemoglobin 12.4 12.0 - 15.0 g/dL    HCT 16.1 09.6 - 04.5 %    MCV 86.0 80.0 - 100.0 fL    MCH 27.6 26.0 - 34.0 pg    MCHC 32.1 30.0 - 36.0 g/dL    RDW 40.9 81.1 - 91.4 %    Platelets 167 150 - 400 K/uL    nRBC 0.0 0.0 - 0.2 %    Neutrophils Relative % 62 %    Neutro Abs 3.7 1.7 - 7.7 K/uL    Lymphocytes Relative 24 %    Lymphs Abs 1.5 0.7 - 4.0 K/uL    Monocytes Relative 10 %    Monocytes Absolute 0.6 0.1 - 1.0 K/uL    Eosinophils Relative 3 %    Eosinophils Absolute 0.2 0.0 - 0.5 K/uL    Basophils Relative 1 %    Basophils Absolute 0.0 0.0 - 0.1 K/uL    Immature  Granulocytes 0 %    Abs Immature Granulocytes 0.01 0.00 - 0.07 K/uL   Comprehensive metabolic panel     Status: Abnormal    Collection Time: 03/22/22  5:54 PM   Result Value Ref Range    Sodium 135 135 - 145 mmol/L    Potassium 3.2 (L) 3.5 - 5.1 mmol/L    Chloride 102 98 - 111 mmol/L    CO2 24 22 - 32 mmol/L    Glucose, Bld 356 (H) 70 - 99 mg/dL    BUN 9 8 - 23 mg/dL    Creatinine, Ser 7.82 0.44 - 1.00 mg/dL    Calcium 9.0 8.9 - 95.6 mg/dL    Total Protein 7.7 6.5 - 8.1 g/dL    Albumin 3.5 3.5 - 5.0 g/dL    AST 28 15 - 41 U/L    ALT 22 0 - 44 U/L    Alkaline Phosphatase 81 38 - 126 U/L    Total Bilirubin 0.9 0.3 - 1.2 mg/dL    GFR, Estimated >21 >30 mL/min    Anion gap 9 5 - 15   Lipase, blood     Status: None    Collection Time: 03/22/22  5:54 PM   Result Value Ref Range    Lipase 45 11 - 51 U/L   Blood gas, venous (at [facility] and [facility], not at [facility])     Status: Abnormal    Collection Time: 03/22/22  6:00 PM   Result Value Ref Range    pH, Ven 7.41 7.25 - 7.43    pCO2, Ven 43 (L) 44 - 60 mmHg    pO2, Ven 121 (H) 32 - 45 mmHg    Bicarbonate 27.3 20.0 - 28.0 mmol/L  Acid-Base Excess 2.2 (H) 0.0 - 2.0 mmol/L    O2 Saturation 100 %    Patient temperature 37.0    Hemoglobin A1c     Status: Abnormal    Collection Time: 03/22/22  6:05 PM   Result Value Ref Range    Hgb A1c MFr Bld 13.0 (H) 4.8 - 5.6 %    Mean Plasma Glucose 326.4 mg/dL   POC CBG, ED     Status: Abnormal    Collection Time: 03/23/22 12:01 AM   Result Value Ref Range    Glucose-Capillary 287 (H) 70 - 99 mg/dL   CBG monitoring, ED     Status: Abnormal    Collection Time: 03/23/22  3:18 AM   Result Value Ref Range    Glucose-Capillary 222 (H) 70 - 99 mg/dL   Comprehensive metabolic panel     Status: Abnormal    Collection Time: 03/23/22  3:40 AM   Result Value Ref Range    Sodium 138 135 - 145 mmol/L    Potassium 3.0 (L) 3.5 - 5.1 mmol/L    Chloride 106 98 - 111 mmol/L    CO2 27 22 - 32 mmol/L    Glucose, Bld 245 (H) 70 - 99 mg/dL    BUN 7 (L) 8 - 23 mg/dL     Creatinine, Ser 1.61 0.44 - 1.00 mg/dL    Calcium 8.8 (L) 8.9 - 10.3 mg/dL    Total Protein 6.8 6.5 - 8.1 g/dL    Albumin 3.1 (L) 3.5 - 5.0 g/dL    AST 23 15 - 41 U/L    ALT 20 0 - 44 U/L    Alkaline Phosphatase 78 38 - 126 U/L    Total Bilirubin 0.8 0.3 - 1.2 mg/dL    GFR, Estimated >09 >60 mL/min    Anion gap 5 5 - 15   CBC with Differential/Platelet     Status: Abnormal    Collection Time: 03/23/22  3:40 AM   Result Value Ref Range    WBC 6.3 4.0 - 10.5 K/uL    RBC 4.14 3.87 - 5.11 MIL/uL    Hemoglobin 11.7 (L) 12.0 - 15.0 g/dL    HCT 45.4 (L) 09.8 - 46.0 %    MCV 85.7 80.0 - 100.0 fL    MCH 28.3 26.0 - 34.0 pg    MCHC 33.0 30.0 - 36.0 g/dL    RDW 11.9 14.7 - 82.9 %    Platelets 148 (L) 150 - 400 K/uL    nRBC 0.0 0.0 - 0.2 %    Neutrophils Relative % 57 %    Neutro Abs 3.5 1.7 - 7.7 K/uL    Lymphocytes Relative 29 %    Lymphs Abs 1.8 0.7 - 4.0 K/uL    Monocytes Relative 11 %    Monocytes Absolute 0.7 0.1 - 1.0 K/uL    Eosinophils Relative 3 %    Eosinophils Absolute 0.2 0.0 - 0.5 K/uL    Basophils Relative 0 %    Basophils Absolute 0.0 0.0 - 0.1 K/uL    Immature Granulocytes 0 %    Abs Immature Granulocytes 0.01 0.00 - 0.07 K/uL   CBG monitoring, ED     Status: Abnormal    Collection Time: 03/23/22  7:15 AM   Result Value Ref Range    Glucose-Capillary 251 (H) 70 - 99 mg/dL   CBG monitoring, ED     Status: Abnormal    Collection Time: 03/23/22 11:37 AM   Result  Value Ref Range    Glucose-Capillary 175 (H) 70 - 99 mg/dL       MR ABDOMEN MRCP W WO CONTAST    Result Date: 03/23/2022  CLINICAL DATA:  Suspected pancreatic mass in a patient with history of potential pancreatitis. EXAM: MRI ABDOMEN WITHOUT AND WITH CONTRAST (INCLUDING MRCP) TECHNIQUE: Multiplanar multisequence MR imaging of the abdomen was performed both before and after the administration of intravenous contrast. Heavily T2-weighted images of the biliary and pancreatic ducts were obtained, and three-dimensional MRCP images were rendered by post  processing. CONTRAST:  10mL GADAVIST GADOBUTROL 1 MMOL/ML IV SOLN COMPARISON:  CT evaluation of June 12 of 2023. FINDINGS: Lower chest: Incidental imaging of the lung bases is unremarkable. Limited assessment on MRI. Hepatobiliary: Subtle hepatic lesion in the RIGHT hepatic lobe, hepatic subsegment V/VI (image 23/5) also visible on diffusion-weighted imaging. Area not well assessed on post-contrast imaging due to motion related artifact. Common bile duct is less than a cm greatest caliber in the setting of prior cholecystectomy is mildly irregular. Some narrowing distally but less narrowing that is seen of the main pancreatic duct which shows a transition in the pancreatic head above the suspected mass in the pancreas, see below. Intrahepatic biliary ducts are moderately dilated again with changes of cholecystectomy. Pancreas: Pancreatic mass centered in the uncinate abutting the SMV with distortion of the SMV, less than 180 degree involvement by MR but with limited assessment with respect to this portion of the evaluation due to motion and susceptibility effects from adjacent bowel. Mass displays hypovascular features and measures 3.3 x 2.7 cm mainly in the uncinate process. Pancreatic duct is narrowed passing above this mass towards the ampulla and is dilated peripherally with cystic areas that are unchanged in the very short interval compared to the prior CT. Small adjacent lymph nodes are present in the small bowel mesentery and there is diffuse mesenteric edema and thickening which is nonspecific Spleen:  Normal. Adrenals/Urinary Tract:  Adrenal glands are normal. Symmetric renal enhancement without hydronephrosis. Stomach/Bowel: No acute gastrointestinal process to the extent evaluated on abdominal MRI. Vascular/Lymphatic: Scattered lymph nodes without pathologic enlargement but with some immediately adjacent to the masslike area in the head of the pancreas, within the small bowel mesentery. Other:  No ascites  Musculoskeletal: No suspicious bone lesions identified. IMPRESSION: Motion limited assessment with hypoenhancing mass in the uncinate process/inferior head of the pancreas with pancreatic ductal narrowing/obstruction and early involvement of the distal common bile duct. 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 Endoscopic assessment may be helpful to establish diagnosis. Subtle hepatic lesion in the RIGHT hepatic lobe, enhancement characteristics cannot be assessed but this does raise the question of hepatic involvement. Vascular involvement is difficult to assess on MR and on the previous CT, subsequent staging with dedicated pancreatic protocol may be helpful. There is at least abutment of the SMV. Hazy mesentery may reflect concomitant mesenteritis or lymphatic congestion. Small lymph nodes adjacent to the masslike area in the head of the pancreas warrant close attention on follow-up and or consideration for evaluation with PET depending on further workup. Electronically Signed   By: Donzetta Kohut M.D.   On: 03/23/2022 11:49     MR 3D Recon At Scanner    Result Date: 03/23/2022  CLINICAL DATA:  Suspected pancreatic mass in a patient with history of potential pancreatitis. EXAM: MRI ABDOMEN WITHOUT AND WITH CONTRAST (INCLUDING MRCP) TECHNIQUE: Multiplanar multisequence MR imaging of the abdomen was performed both before and after the administration of  intravenous contrast. Heavily T2-weighted images of the biliary and pancreatic ducts were obtained, and three-dimensional MRCP images were rendered by post processing. CONTRAST:  10mL GADAVIST GADOBUTROL 1 MMOL/ML IV SOLN COMPARISON:  CT evaluation of June 12 of 2023. FINDINGS: Lower chest: Incidental imaging of the lung bases is unremarkable. Limited assessment on MRI. Hepatobiliary: Subtle hepatic lesion in the RIGHT hepatic lobe, hepatic subsegment V/VI (image 23/5) also visible on diffusion-weighted imaging. Area not well assessed on  post-contrast imaging due to motion related artifact. Common bile duct is less than a cm greatest caliber in the setting of prior cholecystectomy is mildly irregular. Some narrowing distally but less narrowing that is seen of the main pancreatic duct which shows a transition in the pancreatic head above the suspected mass in the pancreas, see below. Intrahepatic biliary ducts are moderately dilated again with changes of cholecystectomy. Pancreas: Pancreatic mass centered in the uncinate abutting the SMV with distortion of the SMV, less than 180 degree involvement by MR but with limited assessment with respect to this portion of the evaluation due to motion and susceptibility effects from adjacent bowel. Mass displays hypovascular features and measures 3.3 x 2.7 cm mainly in the uncinate process. Pancreatic duct is narrowed passing above this mass towards the ampulla and is dilated peripherally with cystic areas that are unchanged in the very short interval compared to the prior CT. Small adjacent lymph nodes are present in the small bowel mesentery and there is diffuse mesenteric edema and thickening which is nonspecific Spleen:  Normal. Adrenals/Urinary Tract:  Adrenal glands are normal. Symmetric renal enhancement without hydronephrosis. Stomach/Bowel: No acute gastrointestinal process to the extent evaluated on abdominal MRI. Vascular/Lymphatic: Scattered lymph nodes without pathologic enlargement but with some immediately adjacent to the masslike area in the head of the pancreas, within the small bowel mesentery. Other:  No ascites Musculoskeletal: No suspicious bone lesions identified. IMPRESSION: Motion limited assessment with hypoenhancing mass in the uncinate process/inferior head of the pancreas with pancreatic ductal narrowing/obstruction and early involvement of the distal common bile duct. 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 Endoscopic assessment may be helpful to establish  diagnosis. Subtle hepatic lesion in the RIGHT hepatic lobe, enhancement characteristics cannot be assessed but this does raise the question of hepatic involvement. Vascular involvement is difficult to assess on MR and on the previous CT, subsequent staging with dedicated pancreatic protocol may be helpful. There is at least abutment of the SMV. Hazy mesentery may reflect concomitant mesenteritis or lymphatic congestion. Small lymph nodes adjacent to the masslike area in the head of the pancreas warrant close attention on follow-up and or consideration for evaluation with PET depending on further workup. Electronically Signed   By: Donzetta Kohut M.D.   On: 03/23/2022 11:49     CT Abdomen Pelvis W Contrast    Result Date: 03/22/2022  CLINICAL DATA:  Acute abdominal pain.  An intentional weight loss. EXAM: CT ABDOMEN AND PELVIS WITH CONTRAST TECHNIQUE: Multidetector CT imaging of the abdomen and pelvis was performed using the standard protocol following bolus administration of intravenous contrast. RADIATION DOSE REDUCTION: This exam was performed according to the departmental dose-optimization program which includes automated exposure control, adjustment of the mA and/or kV according to patient size and/or use of iterative reconstruction technique. CONTRAST:  OMNIPAQUE IOHEXOL 300 MG/ML  SOLN COMPARISON:  None Available. FINDINGS: Lower chest: The visualized lung bases are clear. No intra-abdominal free air.  Small free fluid in the pelvis. Hepatobiliary: Fatty liver. Faint hypodense focus  in the inferior right lobe of the liver (27/2 and 72/4) is too small to characterize. No intrahepatic biliary dilatation. Cholecystectomy. No retained calcified stone noted in the central CBD. Pancreas: There is an ill-defined 3.6 x 2.5 cm hypodense mass in the uncinate process of the pancreas. There is mild atrophy of the pancreas with mild dilatation of the main pancreatic duct. There is inflammatory changes and stranding of  the mesentery inferior to the pancreas. 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 There may be superimposed pancreatitis. Correlation with pancreatic enzymes recommended. Further evaluation of the pancreatic lesion with pancreatic mass protocol MRI on a nonemergent/outpatient basis recommended. 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 No drainable fluid collection/abscess or pseudocyst. Spleen: Normal in size without focal abnormality. Adrenals/Urinary Tract: The adrenal glands are unremarkable. There is no hydronephrosis on either side. There is symmetric enhancement and excretion of contrast by both kidneys. The visualized ureters and urinary bladder appear unremarkable. Stomach/Bowel: There is no bowel obstruction or active inflammation. The appendix is normal. Vascular/Lymphatic: Mild aortoiliac atherosclerotic disease. The IVC is unremarkable. No portal venous gas. Small scattered mesenteric lymph nodes. No adenopathy. Reproductive: Hysterectomy.  No adnexal masses. Other: Ventral hernia repair mesh. Small fat containing ventral hernia noted. There is abutment of several loops of small bowel to the anterior hernia repair mesh consistent with adhesions. Musculoskeletal: Degenerative changes of the spine. No acute osseous pathology. IMPRESSION: 1. Findings most concerning for pancreatic malignancy arising in the uncinate process of the pancreas with possible associated acute pancreatitis. Correlation with pancreatic enzymes and further evaluation of the pancreatic mass with MRI on a nonemergent/outpatient basis recommended. 2. Fatty liver. 3. No bowel obstruction. Normal appendix. 4. Aortic Atherosclerosis (ICD10-I70.0). Electronically Signed   By: Elgie Collard M.D.   On: 03/22/2022 21:54      ROS:  As stated above in the HPI otherwise  negative.    Blood pressure (!) 170/102, pulse 70, temperature (!) 97.1 F (36.2 C), temperature source Oral, resp. rate 16, height 5' 6 (1.676 m), weight 112.5 kg, SpO2 99 %.      PE:  Gen: NAD, Alert and Oriented  HEENT:  NC/AT, EOMI  Neck: Supple, no LAD  Lungs: CTA Bilaterally  CV: RRR without M/G/R  ABD: Soft, NTND, +BS  Ext: No C/C/E    Assessment/Plan:  1) Pancreatic head mass.  2) Abdominal pain.  3) Weight loss.  4) DM - uncontrolled.  50 Diarrhea.     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  Further evaluation with an EUS/FNA will be performed.  The scheduling will be difficult as there is limited time.  The plan is to perform the EUS with FNA this Friday, but she may be bumped up pending any cancellations.  As for her diarrhea, this may be from pancreatic insufficiency.  She will be started on Creon.  One of the side effects is hyperglycemia as the medication will now allow her to absorb nutrients.    Plan:  1) EUS/FNA.  2) Creon.    HUNG,PATRICK D  03/23/2022, 2:29 PM       Electronically signed by Jeani Hawking, MD at 03/23/2022  3:23 PM EDT

## 2022-03-23 NOTE — Progress Notes (Signed)
Formatting of this note is different from the original.  Images from the original note were not included.    PROGRESS NOTE    Lynn Blackwell  ZOX:[id number] DOB: 1941/05/24 DOA: 03/22/2022  PCP: Margot Ables, MD (Inactive)     Brief Narrative:  81 year old past medical history significant for diabetes type 2, hypertension, hyperlipidemia, morbid obesity BMI of 40 presents complaining of 3 weeks of diarrhea, weight loss, abdominal pain.  She reported 40 pound of weight loss since last week.  She has been having also blood sugars in the 300 range.    Evaluation in the ED lipase 45, CT abdomen pelvis with contrast showing new pancreatic mass.  Patient is status postcholecystectomy.    Assessment & Plan:    Principal Problem:    Pancreatic mass  Active Problems:    Unintentional weight loss    Hyperlipidemia associated with type 2 diabetes mellitus (HCC)    Hypertension associated with diabetes (HCC)    Morbid (severe) obesity due to excess calories (HCC)    Uncontrolled type 2 diabetes mellitus with hyperglycemia, without long-term current use of insulin (HCC)    1-Pancreatic Mass:   Patient presents with weight loss, abdominal pain, diarrhea.  Dr. Elnoria Howard has seen consulted.  MRCP ordered.    2-unintentional weight loss: The setting of pancreatic mass.  GI consulted.    Uncontrolled diabetes type 2 with hyperglycemia.  Started on Lantus.  She will need insulin at discharge.  A1c at 13.    Morbid obesity:  Lifestyle modification    Hypertension associated with diabetes:  Resume Coreg, Cardura, Lasix and Zestril    Hyperlipidemia:   Hypokalemia; replete orally.     Estimated body mass index is 40.03 kg/m as calculated from the following:    Height as of this encounter: 5' 6 (1.676 m).    Weight as of this encounter: 112.5 kg.    DVT prophylaxis: heparin   Code Status: Full code  Family Communication: Care discussed with patient.   Disposition Plan:   Status is: Observation  The patient remains OBS appropriate  and will d/c before 2 midnights.    Consultants:   Dr Elnoria Howard    Procedures:     Antimicrobials:     Subjective:  She report diarrhea, abdominal pian for 3 weeks   Weight loss.     Objective:  Vitals:    03/23/22 0500 03/23/22 0528 03/23/22 0600 03/23/22 0700   BP: (!) 187/81  (!) 157/81 (!) 151/76   Pulse: 76  63 65   Resp: 14  18 12    Temp:  (!) 97.1 F (36.2 C)     TempSrc:  Oral     SpO2: 95%  97% 97%   Weight:       Height:         No intake or output data in the 24 hours ending 03/23/22 0725  Filed Weights    03/22/22 1743   Weight: 112.5 kg     Examination:    General exam: Appears calm and comfortable   Respiratory system: Clear to auscultation. Respiratory effort normal.  Cardiovascular system: S1 & S2 heard, RRR. No JVD, murmurs, rubs, gallops or clicks. No pedal edema.  Gastrointestinal system: Abdomen is nondistended, soft and mild tender.   Central nervous system: Alert and oriented. No focal neurological deficits.  Extremities: Symmetric 5 x 5 power.    Data Reviewed: I have personally reviewed following labs and imaging studies  CBC:  Recent Labs   Lab 03/22/22  1754 03/23/22  0340   WBC 6.1 6.3   NEUTROABS 3.7 3.5   HGB 12.4 11.7*   HCT 38.6 35.5*   MCV 86.0 85.7   PLT 167 148*     Basic Metabolic Panel:  Recent Labs   Lab 03/22/22  1754 03/23/22  0340   NA 135 138   K 3.2* 3.0*   CL 102 106   CO2 24 27   GLUCOSE 356* 245*   BUN 9 7*   CREATININE 0.71 0.58   CALCIUM 9.0 8.8*     GFR:  Estimated Creatinine Clearance: 72.6 mL/min (by C-G formula based on SCr of 0.58 mg/dL).  Liver Function Tests:  Recent Labs   Lab 03/22/22  1754 03/23/22  0340   AST 28 23   ALT 22 20   ALKPHOS 81 78   BILITOT 0.9 0.8   PROT 7.7 6.8   ALBUMIN 3.5 3.1*     Recent Labs   Lab 03/22/22  1754   LIPASE 45     No results for input(s): AMMONIA in the last 168 hours.  Coagulation Profile:  No results for input(s): INR, PROTIME in the last 168 hours.  Cardiac Enzymes:  No results for input(s): CKTOTAL, CKMB,  CKMBINDEX, TROPONINI in the last 168 hours.  BNP (last 3 results)  No results for input(s): PROBNP in the last 8760 hours.  HbA1C:  Recent Labs     03/22/22  1805   HGBA1C 13.0*     CBG:  Recent Labs   Lab 03/22/22  1729 03/23/22  0001 03/23/22  0318 03/23/22  0715   GLUCAP 407* 287* 222* 251*     Lipid Profile:  No results for input(s): CHOL, HDL, LDLCALC, TRIG, CHOLHDL, LDLDIRECT in the last 72 hours.  Thyroid Function Tests:  No results for input(s): TSH, T4TOTAL, FREET4, T3FREE, THYROIDAB in the last 72 hours.  Anemia Panel:  No results for input(s): VITAMINB12, FOLATE, FERRITIN, TIBC, IRON, RETICCTPCT in the last 72 hours.  Sepsis Labs:  No results for input(s): PROCALCITON, LATICACIDVEN in the last 168 hours.    No results found for this or any previous visit (from the past 240 hour(s)).     Radiology Studies:  CT Abdomen Pelvis W Contrast    Result Date: 03/22/2022  CLINICAL DATA:  Acute abdominal pain.  An intentional weight loss. EXAM: CT ABDOMEN AND PELVIS WITH CONTRAST TECHNIQUE: Multidetector CT imaging of the abdomen and pelvis was performed using the standard protocol following bolus administration of intravenous contrast. RADIATION DOSE REDUCTION: This exam was performed according to the departmental dose-optimization program which includes automated exposure control, adjustment of the mA and/or kV according to patient size and/or use of iterative reconstruction technique. CONTRAST:  OMNIPAQUE IOHEXOL 300 MG/ML  SOLN COMPARISON:  None Available. FINDINGS: Lower chest: The visualized lung bases are clear. No intra-abdominal free air.  Small free fluid in the pelvis. Hepatobiliary: Fatty liver. Faint hypodense focus in the inferior right lobe of the liver (27/2 and 72/4) is too small to characterize. No intrahepatic biliary dilatation. Cholecystectomy. No retained calcified stone noted in the central CBD. Pancreas: There is an ill-defined 3.6 x 2.5 cm  hypodense mass in the uncinate process of the pancreas. There is mild atrophy of the pancreas with mild dilatation of the main pancreatic duct. There is inflammatory changes and stranding of the mesentery inferior to the pancreas. Findings concerning for a pancreatic malignancy (adenocarcinoma)  given patient's history of unintentional weight loss. There may be superimposed pancreatitis. Correlation with pancreatic enzymes recommended. Further evaluation of the pancreatic lesion with pancreatic mass protocol MRI on a nonemergent/outpatient basis recommended. 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 No drainable fluid collection/abscess or pseudocyst. Spleen: Normal in size without focal abnormality. Adrenals/Urinary Tract: The adrenal glands are unremarkable. There is no hydronephrosis on either side. There is symmetric enhancement and excretion of contrast by both kidneys. The visualized ureters and urinary bladder appear unremarkable. Stomach/Bowel: There is no bowel obstruction or active inflammation. The appendix is normal. Vascular/Lymphatic: Mild aortoiliac atherosclerotic disease. The IVC is unremarkable. No portal venous gas. Small scattered mesenteric lymph nodes. No adenopathy. Reproductive: Hysterectomy.  No adnexal masses. Other: Ventral hernia repair mesh. Small fat containing ventral hernia noted. There is abutment of several loops of small bowel to the anterior hernia repair mesh consistent with adhesions. Musculoskeletal: Degenerative changes of the spine. No acute osseous pathology. IMPRESSION: 1. Findings most concerning for pancreatic malignancy arising in the uncinate process of the pancreas with possible associated acute pancreatitis. Correlation with pancreatic enzymes and further evaluation of the pancreatic mass with MRI on a nonemergent/outpatient basis recommended. 2. Fatty liver. 3. No bowel  obstruction. Normal appendix. 4. Aortic Atherosclerosis (ICD10-I70.0). Electronically Signed   By: Elgie CollardArash  Radparvar M.D.   On: 03/22/2022 21:54      Scheduled Meds:   carvedilol  25 mg Oral BID WC    doxazosin  4 mg Oral QHS    furosemide  20 mg Oral Daily    heparin  5,000 Units Subcutaneous Q8H    insulin aspart  0-15 Units Subcutaneous TID WC    insulin aspart  0-5 Units Subcutaneous QHS    insulin glargine-yfgn  10 Units Subcutaneous QHS    lisinopril  20 mg Oral Daily     Continuous Infusions:     LOS: 0 days     Time spent: 35 minutes.     Alba CoryBelkys A Regalado, MD  [facility]    If 7PM-7AM, please contact night-coverage  [email or web address]    03/23/2022, 7:25 AM    Electronically signed by Alba Coryegalado, Belkys A, MD at 03/23/2022  2:57 PM EDT

## 2022-03-23 NOTE — Progress Notes (Signed)
PROGRESS NOTE    Sarah Davies  IPJ:[id number] DOB: 1941-01-21 DOA: 03/22/2022 PCP: Buzzy Han, MD (Inactive)   Brief Narrative: 81 year old past medical history significant for diabetes type 2, hypertension, hyperlipidemia, morbid obesity BMI of 40 presents complaining of 3 weeks of diarrhea, weight loss, abdominal pain.  She reported 40 pound of weight loss since last week.  She has been having also blood sugars in the 300 range.  Evaluation in the ED lipase 45, CT abdomen pelvis with contrast showing new pancreatic mass.  Patient is status postcholecystectomy.   Assessment & Plan:   Principal Problem:   Pancreatic mass Active Problems:   Unintentional weight loss   Hyperlipidemia associated with type 2 diabetes mellitus ([place])   Hypertension associated with diabetes ([place])   Morbid (severe) obesity due to excess calories ([place])   Uncontrolled type 2 diabetes mellitus with hyperglycemia, without long-term current use of insulin ([place])  1-Pancreatic Mass:  Patient presents with weight loss, abdominal pain, diarrhea. Dr. Benson Norway has seen consulted. MRCP ordered.  2-unintentional weight loss: The setting of pancreatic mass. GI consulted.  Uncontrolled diabetes type 2 with hyperglycemia. Started on Lantus.  She will need insulin at discharge.  A1c at 13.  Morbid obesity: Lifestyle modification  Hypertension associated with diabetes: Resume Coreg, Cardura, Lasix and Zestril  Hyperlipidemia:  Hypokalemia; replete orally.   Estimated body mass index is 40.03 kg/m as calculated from the following:   Height as of this encounter: '5\' 6"'$  (1.676 m).   Weight as of this encounter: 112.5 kg.   DVT prophylaxis: heparin  Code Status: Full code Family Communication: Care discussed with patient.  Disposition Plan:  Status is: Observation The patient remains OBS appropriate and will d/c before 2 midnights.    Consultants:  Dr Benson Norway  Procedures:     Antimicrobials:    Subjective: She report diarrhea, abdominal pian for 3 weeks  Weight loss.   Objective: Vitals:   03/23/22 0500 03/23/22 0528 03/23/22 0600 03/23/22 0700  BP: (!) 187/81  (!) 157/81 (!) 151/76  Pulse: 76  63 65  Resp: '14  18 12  '$ Temp:  (!) 97.1 F (36.2 C)    TempSrc:  Oral    SpO2: 95%  97% 97%  Weight:      Height:       No intake or output data in the 24 hours ending 03/23/22 0725 Filed Weights   03/22/22 1743  Weight: 112.5 kg    Examination:  General exam: Appears calm and comfortable  Respiratory system: Clear to auscultation. Respiratory effort normal. Cardiovascular system: S1 & S2 heard, RRR. No JVD, murmurs, rubs, gallops or clicks. No pedal edema. Gastrointestinal system: Abdomen is nondistended, soft and mild tender.  Central nervous system: Alert and oriented. No focal neurological deficits. Extremities: Symmetric 5 x 5 power.    Data Reviewed: I have personally reviewed following labs and imaging studies  CBC: Recent Labs  Lab 03/22/22 1754 03/23/22 0340  WBC 6.1 6.3  NEUTROABS 3.7 3.5  HGB 12.4 11.7*  HCT 38.6 35.5*  MCV 86.0 85.7  PLT 167 734*   Basic Metabolic Panel: Recent Labs  Lab 03/22/22 1754 03/23/22 0340  NA 135 138  K 3.2* 3.0*  CL 102 106  CO2 24 27  GLUCOSE 356* 245*  BUN 9 7*  CREATININE 0.71 0.58  CALCIUM 9.0 8.8*   GFR: Estimated Creatinine Clearance: 72.6 mL/min (by C-G formula based on SCr of 0.58 mg/dL). Liver Function Tests: Recent Labs  Lab 03/22/22 1754 03/23/22 0340  AST 28 23  ALT 22 20  ALKPHOS 81 78  BILITOT 0.9 0.8  PROT 7.7 6.8  ALBUMIN 3.5 3.1*   Recent Labs  Lab 03/22/22 1754  LIPASE 45   No results for input(s): "AMMONIA" in the last 168 hours. Coagulation Profile: No results for input(s): "INR", "PROTIME" in the last 168 hours. Cardiac Enzymes: No results for input(s): "CKTOTAL", "CKMB", "CKMBINDEX", "TROPONINI" in the last 168 hours. BNP (last 3 results) No  results for input(s): "PROBNP" in the last 8760 hours. HbA1C: Recent Labs    03/22/22 1805  HGBA1C 13.0*   CBG: Recent Labs  Lab 03/22/22 1729 03/23/22 0001 03/23/22 0318 03/23/22 0715  GLUCAP 407* 287* 222* 251*   Lipid Profile: No results for input(s): "CHOL", "HDL", "LDLCALC", "TRIG", "CHOLHDL", "LDLDIRECT" in the last 72 hours. Thyroid Function Tests: No results for input(s): "TSH", "T4TOTAL", "FREET4", "T3FREE", "THYROIDAB" in the last 72 hours. Anemia Panel: No results for input(s): "VITAMINB12", "FOLATE", "FERRITIN", "TIBC", "IRON", "RETICCTPCT" in the last 72 hours. Sepsis Labs: No results for input(s): "PROCALCITON", "LATICACIDVEN" in the last 168 hours.  No results found for this or any previous visit (from the past 240 hour(s)).       Radiology Studies: CT Abdomen Pelvis W Contrast  Result Date: 03/22/2022 CLINICAL DATA:  Acute abdominal pain.  An intentional weight loss. EXAM: CT ABDOMEN AND PELVIS WITH CONTRAST TECHNIQUE: Multidetector CT imaging of the abdomen and pelvis was performed using the standard protocol following bolus administration of intravenous contrast. RADIATION DOSE REDUCTION: This exam was performed according to the departmental dose-optimization program which includes automated exposure control, adjustment of the mA and/or kV according to patient size and/or use of iterative reconstruction technique. CONTRAST:  153m OMNIPAQUE IOHEXOL 300 MG/ML  SOLN COMPARISON:  None Available. FINDINGS: Lower chest: The visualized lung bases are clear. No intra-abdominal free air.  Small free fluid in the pelvis. Hepatobiliary: Fatty liver. Faint hypodense focus in the inferior right lobe of the liver (27/2 and 72/4) is too small to characterize. No intrahepatic biliary dilatation. Cholecystectomy. No retained calcified stone noted in the central CBD. Pancreas: There is an ill-defined 3.6 x 2.5 cm hypodense mass in the uncinate process of the pancreas. There is mild  atrophy of the pancreas with mild dilatation of the main pancreatic duct. There is inflammatory changes and stranding of the mesentery inferior to the pancreas. 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 There may be superimposed pancreatitis. Correlation with pancreatic enzymes recommended. Further evaluation of the pancreatic lesion with pancreatic mass protocol MRI on a nonemergent/outpatient basis recommended. 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 No drainable fluid collection/abscess or pseudocyst. Spleen: Normal in size without focal abnormality. Adrenals/Urinary Tract: The adrenal glands are unremarkable. There is no hydronephrosis on either side. There is symmetric enhancement and excretion of contrast by both kidneys. The visualized ureters and urinary bladder appear unremarkable. Stomach/Bowel: There is no bowel obstruction or active inflammation. The appendix is normal. Vascular/Lymphatic: Mild aortoiliac atherosclerotic disease. The IVC is unremarkable. No portal venous gas. Small scattered mesenteric lymph nodes. No adenopathy. Reproductive: Hysterectomy.  No adnexal masses. Other: Ventral hernia repair mesh. Small fat containing ventral hernia noted. There is abutment of several loops of small bowel to the anterior hernia repair mesh consistent with adhesions. Musculoskeletal: Degenerative changes of the spine. No acute osseous pathology.  IMPRESSION: 1. Findings most concerning for pancreatic malignancy arising in the uncinate process of the pancreas with possible associated acute pancreatitis. Correlation with pancreatic enzymes and further evaluation of the pancreatic mass with MRI on a nonemergent/outpatient basis recommended. 2. Fatty liver. 3. No bowel obstruction. Normal appendix. 4. Aortic Atherosclerosis (ICD10-I70.0).  Electronically Signed   By: Anner Crete M.D.   On: 03/22/2022 21:54        Scheduled Meds:  carvedilol  25 mg Oral BID WC   doxazosin  4 mg Oral QHS   furosemide  20 mg Oral Daily   heparin  5,000 Units Subcutaneous Q8H   insulin aspart  0-15 Units Subcutaneous TID WC   insulin aspart  0-5 Units Subcutaneous QHS   insulin glargine-yfgn  10 Units Subcutaneous QHS   lisinopril  20 mg Oral Daily   Continuous Infusions:   LOS: 0 days    Time spent: 35 minutes.     Elmarie Shiley, MD [facility]   If 7PM-7AM, please contact night-coverage [email or web address]  03/23/2022, 7:25 AM

## 2022-03-23 NOTE — Consult Note (Signed)
Reason for Consult: Pancreatic mass Referring Physician: [facility]  [name] HPI: This is a 81 year old female with a PMH of HTN, HLD, DM, and morbid obesity admitted for complaints of abdominal pain and unintentional weight loss.  The patient reports losing 37 lbs over the past two months and this was associated with diarrhea as well as abdominal pain.  Her pain started gradually two months ago and she presented to the ER because of her blood glucose.  She was struggling with hyperglycemia.  Imaging of the abdomen showed 3.5 cm mass in the uncinate process of the pancreas.  Her liver enzymes were normal, but her blood glucose was elevated.  Past Medical History:  Diagnosis Date   Allergy    Cataract    Diabetes mellitus without complication ([place])    Hypertension     Past Surgical History:  Procedure Laterality Date   ABDOMINAL HYSTERECTOMY     CHOLECYSTECTOMY     SPINE SURGERY      History reviewed. No pertinent family history.  Social History:  reports that she has never smoked. She has never used smokeless tobacco. She reports that she does not drink alcohol and does not use drugs.  Allergies:  Allergies  Allergen Reactions   Nicotine Other (See Comments)    Throat closes    Other Other (See Comments)    Joint pain Other reaction(s): Other (See Comments) Animal Dander From when she was tested, it was stimulated.      Shellfish Allergy Other (See Comments)    Has never had a reaction, was just told through allergy testing that she is allergic to shellfish "Throat closes"   Molds & Smuts Other (See Comments)    From when she was tested, it was stimulated.   Other reaction(s): Other (See Comments) From when she was tested, it was stimulated.     Statins Other (See Comments)    Muscle aches    Medications: Scheduled:  carvedilol  25 mg Oral BID WC   doxazosin  4 mg Oral QHS   furosemide  20 mg Oral Daily   heparin  5,000 Units Subcutaneous Q8H    insulin aspart  0-15 Units Subcutaneous TID WC   insulin aspart  0-5 Units Subcutaneous QHS   insulin glargine-yfgn  10 Units Subcutaneous QHS   lisinopril  20 mg Oral Daily   Continuous:  Results for orders placed or performed during the hospital encounter of 03/22/22 (from the past 24 hour(s))  CBG monitoring, ED     Status: Abnormal   Collection Time: 03/22/22  5:29 PM  Result Value Ref Range   Glucose-Capillary 407 (H) 70 - 99 mg/dL   Comment 1 Notify RN   Urinalysis, Routine w reflex microscopic Urine, Clean Catch     Status: Abnormal   Collection Time: 03/22/22  5:46 PM  Result Value Ref Range   Color, Urine YELLOW YELLOW   APPearance CLEAR CLEAR   Specific Gravity, Urine 1.027 1.005 - 1.030   pH 5.0 5.0 - 8.0   Glucose, UA >=500 (A) NEGATIVE mg/dL   Hgb urine dipstick NEGATIVE NEGATIVE   Bilirubin Urine NEGATIVE NEGATIVE   Ketones, ur 20 (A) NEGATIVE mg/dL   Protein, ur NEGATIVE NEGATIVE mg/dL   Nitrite NEGATIVE NEGATIVE   Leukocytes,Ua NEGATIVE NEGATIVE   RBC / HPF 0-5 0 - 5 RBC/hpf   WBC, UA 0-5 0 - 5 WBC/hpf   Bacteria, UA RARE (A) NONE SEEN   Squamous Epithelial /  LPF 0-5 0 - 5   Mucus PRESENT   CBC with Differential     Status: None   Collection Time: 03/22/22  5:54 PM  Result Value Ref Range   WBC 6.1 4.0 - 10.5 K/uL   RBC 4.49 3.87 - 5.11 MIL/uL   Hemoglobin 12.4 12.0 - 15.0 g/dL   HCT 38.6 36.0 - 46.0 %   MCV 86.0 80.0 - 100.0 fL   MCH 27.6 26.0 - 34.0 pg   MCHC 32.1 30.0 - 36.0 g/dL   RDW 12.8 11.5 - 15.5 %   Platelets 167 150 - 400 K/uL   nRBC 0.0 0.0 - 0.2 %   Neutrophils Relative % 62 %   Neutro Abs 3.7 1.7 - 7.7 K/uL   Lymphocytes Relative 24 %   Lymphs Abs 1.5 0.7 - 4.0 K/uL   Monocytes Relative 10 %   Monocytes Absolute 0.6 0.1 - 1.0 K/uL   Eosinophils Relative 3 %   Eosinophils Absolute 0.2 0.0 - 0.5 K/uL   Basophils Relative 1 %   Basophils Absolute 0.0 0.0 - 0.1 K/uL   Immature Granulocytes 0 %   Abs Immature Granulocytes 0.01 0.00 -  0.07 K/uL  Comprehensive metabolic panel     Status: Abnormal   Collection Time: 03/22/22  5:54 PM  Result Value Ref Range   Sodium 135 135 - 145 mmol/L   Potassium 3.2 (L) 3.5 - 5.1 mmol/L   Chloride 102 98 - 111 mmol/L   CO2 24 22 - 32 mmol/L   Glucose, Bld 356 (H) 70 - 99 mg/dL   BUN 9 8 - 23 mg/dL   Creatinine, Ser 0.71 0.44 - 1.00 mg/dL   Calcium 9.0 8.9 - 10.3 mg/dL   Total Protein 7.7 6.5 - 8.1 g/dL   Albumin 3.5 3.5 - 5.0 g/dL   AST 28 15 - 41 U/L   ALT 22 0 - 44 U/L   Alkaline Phosphatase 81 38 - 126 U/L   Total Bilirubin 0.9 0.3 - 1.2 mg/dL   GFR, Estimated >60 >60 mL/min   Anion gap 9 5 - 15  Lipase, blood     Status: None   Collection Time: 03/22/22  5:54 PM  Result Value Ref Range   Lipase 45 11 - 51 U/L  Blood gas, venous (at [facility] and [facility], not at [facility])     Status: Abnormal   Collection Time: 03/22/22  6:00 PM  Result Value Ref Range   pH, Ven 7.41 7.25 - 7.43   pCO2, Ven 43 (L) 44 - 60 mmHg   pO2, Ven 121 (H) 32 - 45 mmHg   Bicarbonate 27.3 20.0 - 28.0 mmol/L   Acid-Base Excess 2.2 (H) 0.0 - 2.0 mmol/L   O2 Saturation 100 %   Patient temperature 37.0   Hemoglobin A1c     Status: Abnormal   Collection Time: 03/22/22  6:05 PM  Result Value Ref Range   Hgb A1c MFr Bld 13.0 (H) 4.8 - 5.6 %   Mean Plasma Glucose 326.4 mg/dL  POC CBG, ED     Status: Abnormal   Collection Time: 03/23/22 12:01 AM  Result Value Ref Range   Glucose-Capillary 287 (H) 70 - 99 mg/dL  CBG monitoring, ED     Status: Abnormal   Collection Time: 03/23/22  3:18 AM  Result Value Ref Range   Glucose-Capillary 222 (H) 70 - 99 mg/dL  Comprehensive metabolic panel     Status: Abnormal   Collection Time: 03/23/22  3:40 AM  Result Value Ref Range   Sodium 138 135 - 145 mmol/L   Potassium 3.0 (L) 3.5 - 5.1 mmol/L   Chloride 106 98 - 111 mmol/L   CO2 27 22 - 32 mmol/L   Glucose, Bld 245 (H) 70 - 99 mg/dL   BUN 7 (L) 8 - 23 mg/dL   Creatinine, Ser 0.58 0.44 - 1.00 mg/dL   Calcium 8.8 (L) 8.9 -  10.3 mg/dL   Total Protein 6.8 6.5 - 8.1 g/dL   Albumin 3.1 (L) 3.5 - 5.0 g/dL   AST 23 15 - 41 U/L   ALT 20 0 - 44 U/L   Alkaline Phosphatase 78 38 - 126 U/L   Total Bilirubin 0.8 0.3 - 1.2 mg/dL   GFR, Estimated >60 >60 mL/min   Anion gap 5 5 - 15  CBC with Differential/Platelet     Status: Abnormal   Collection Time: 03/23/22  3:40 AM  Result Value Ref Range   WBC 6.3 4.0 - 10.5 K/uL   RBC 4.14 3.87 - 5.11 MIL/uL   Hemoglobin 11.7 (L) 12.0 - 15.0 g/dL   HCT 35.5 (L) 36.0 - 46.0 %   MCV 85.7 80.0 - 100.0 fL   MCH 28.3 26.0 - 34.0 pg   MCHC 33.0 30.0 - 36.0 g/dL   RDW 12.8 11.5 - 15.5 %   Platelets 148 (L) 150 - 400 K/uL   nRBC 0.0 0.0 - 0.2 %   Neutrophils Relative % 57 %   Neutro Abs 3.5 1.7 - 7.7 K/uL   Lymphocytes Relative 29 %   Lymphs Abs 1.8 0.7 - 4.0 K/uL   Monocytes Relative 11 %   Monocytes Absolute 0.7 0.1 - 1.0 K/uL   Eosinophils Relative 3 %   Eosinophils Absolute 0.2 0.0 - 0.5 K/uL   Basophils Relative 0 %   Basophils Absolute 0.0 0.0 - 0.1 K/uL   Immature Granulocytes 0 %   Abs Immature Granulocytes 0.01 0.00 - 0.07 K/uL  CBG monitoring, ED     Status: Abnormal   Collection Time: 03/23/22  7:15 AM  Result Value Ref Range   Glucose-Capillary 251 (H) 70 - 99 mg/dL  CBG monitoring, ED     Status: Abnormal   Collection Time: 03/23/22 11:37 AM  Result Value Ref Range   Glucose-Capillary 175 (H) 70 - 99 mg/dL     MR ABDOMEN MRCP W WO CONTAST  Result Date: 03/23/2022 CLINICAL DATA:  Suspected pancreatic mass in a patient with history of potential pancreatitis. EXAM: MRI ABDOMEN WITHOUT AND WITH CONTRAST (INCLUDING MRCP) TECHNIQUE: Multiplanar multisequence MR imaging of the abdomen was performed both before and after the administration of intravenous contrast. Heavily T2-weighted images of the biliary and pancreatic ducts were obtained, and three-dimensional MRCP images were rendered by post processing. CONTRAST:  47m GADAVIST GADOBUTROL 1 MMOL/ML IV SOLN  COMPARISON:  CT evaluation of June 12 of 2023. FINDINGS: Lower chest: Incidental imaging of the lung bases is unremarkable. Limited assessment on MRI. Hepatobiliary: Subtle hepatic lesion in the RIGHT hepatic lobe, hepatic subsegment V/VI (image 23/5) also visible on diffusion-weighted imaging. Area not well assessed on post-contrast imaging due to motion related artifact. Common bile duct is less than a cm greatest caliber in the setting of prior cholecystectomy is mildly irregular. Some narrowing distally but less narrowing that is seen of the main pancreatic duct which shows a transition in the pancreatic head above the suspected mass in the pancreas, see below. Intrahepatic biliary ducts  are moderately dilated again with changes of cholecystectomy. Pancreas: Pancreatic mass centered in the uncinate abutting the SMV with distortion of the SMV, less than 180 degree involvement by MR but with limited assessment with respect to this portion of the evaluation due to motion and susceptibility effects from adjacent bowel. Mass displays hypovascular features and measures 3.3 x 2.7 cm mainly in the uncinate process. Pancreatic duct is narrowed passing above this mass towards the ampulla and is dilated peripherally with cystic areas that are unchanged in the very short interval compared to the prior CT. Small adjacent lymph nodes are present in the small bowel mesentery and there is diffuse mesenteric edema and thickening which is nonspecific Spleen:  Normal. Adrenals/Urinary Tract:  Adrenal glands are normal. Symmetric renal enhancement without hydronephrosis. Stomach/Bowel: No acute gastrointestinal process to the extent evaluated on abdominal MRI. Vascular/Lymphatic: Scattered lymph nodes without pathologic enlargement but with some immediately adjacent to the masslike area in the head of the pancreas, within the small bowel mesentery. Other:  No ascites Musculoskeletal: No suspicious bone lesions identified.  IMPRESSION: Motion limited assessment with hypoenhancing mass in the uncinate process/inferior head of the pancreas with pancreatic ductal narrowing/obstruction and early involvement of the distal common bile duct. 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 Endoscopic assessment may be helpful to establish diagnosis. Subtle hepatic lesion in the RIGHT hepatic lobe, enhancement characteristics cannot be assessed but this does raise the question of hepatic involvement. Vascular involvement is difficult to assess on MR and on the previous CT, subsequent staging with dedicated pancreatic protocol may be helpful. There is at least abutment of the SMV. Hazy mesentery may reflect concomitant mesenteritis or lymphatic congestion. Small lymph nodes adjacent to the masslike area in the head of the pancreas warrant close attention on follow-up and or consideration for evaluation with PET depending on further workup. Electronically Signed   By: Zetta Bills M.D.   On: 03/23/2022 11:49   MR 3D Recon At Scanner  Result Date: 03/23/2022 CLINICAL DATA:  Suspected pancreatic mass in a patient with history of potential pancreatitis. EXAM: MRI ABDOMEN WITHOUT AND WITH CONTRAST (INCLUDING MRCP) TECHNIQUE: Multiplanar multisequence MR imaging of the abdomen was performed both before and after the administration of intravenous contrast. Heavily T2-weighted images of the biliary and pancreatic ducts were obtained, and three-dimensional MRCP images were rendered by post processing. CONTRAST:  50m GADAVIST GADOBUTROL 1 MMOL/ML IV SOLN COMPARISON:  CT evaluation of June 12 of 2023. FINDINGS: Lower chest: Incidental imaging of the lung bases is unremarkable. Limited assessment on MRI. Hepatobiliary: Subtle hepatic lesion in the RIGHT hepatic lobe, hepatic subsegment V/VI (image 23/5) also visible on diffusion-weighted imaging. Area not well assessed on post-contrast imaging due to motion related artifact. Common bile  duct is less than a cm greatest caliber in the setting of prior cholecystectomy is mildly irregular. Some narrowing distally but less narrowing that is seen of the main pancreatic duct which shows a transition in the pancreatic head above the suspected mass in the pancreas, see below. Intrahepatic biliary ducts are moderately dilated again with changes of cholecystectomy. Pancreas: Pancreatic mass centered in the uncinate abutting the SMV with distortion of the SMV, less than 180 degree involvement by MR but with limited assessment with respect to this portion of the evaluation due to motion and susceptibility effects from adjacent bowel. Mass displays hypovascular features and measures 3.3 x 2.7 cm mainly in the uncinate process. Pancreatic duct is narrowed passing above this mass towards the ampulla  and is dilated peripherally with cystic areas that are unchanged in the very short interval compared to the prior CT. Small adjacent lymph nodes are present in the small bowel mesentery and there is diffuse mesenteric edema and thickening which is nonspecific Spleen:  Normal. Adrenals/Urinary Tract:  Adrenal glands are normal. Symmetric renal enhancement without hydronephrosis. Stomach/Bowel: No acute gastrointestinal process to the extent evaluated on abdominal MRI. Vascular/Lymphatic: Scattered lymph nodes without pathologic enlargement but with some immediately adjacent to the masslike area in the head of the pancreas, within the small bowel mesentery. Other:  No ascites Musculoskeletal: No suspicious bone lesions identified. IMPRESSION: Motion limited assessment with hypoenhancing mass in the uncinate process/inferior head of the pancreas with pancreatic ductal narrowing/obstruction and early involvement of the distal common bile duct. 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 Endoscopic assessment may be helpful to establish diagnosis. Subtle hepatic lesion in the RIGHT hepatic lobe, enhancement  characteristics cannot be assessed but this does raise the question of hepatic involvement. Vascular involvement is difficult to assess on MR and on the previous CT, subsequent staging with dedicated pancreatic protocol may be helpful. There is at least abutment of the SMV. Hazy mesentery may reflect concomitant mesenteritis or lymphatic congestion. Small lymph nodes adjacent to the masslike area in the head of the pancreas warrant close attention on follow-up and or consideration for evaluation with PET depending on further workup. Electronically Signed   By: Zetta Bills M.D.   On: 03/23/2022 11:49   CT Abdomen Pelvis W Contrast  Result Date: 03/22/2022 CLINICAL DATA:  Acute abdominal pain.  An intentional weight loss. EXAM: CT ABDOMEN AND PELVIS WITH CONTRAST TECHNIQUE: Multidetector CT imaging of the abdomen and pelvis was performed using the standard protocol following bolus administration of intravenous contrast. RADIATION DOSE REDUCTION: This exam was performed according to the departmental dose-optimization program which includes automated exposure control, adjustment of the mA and/or kV according to patient size and/or use of iterative reconstruction technique. CONTRAST:  118m OMNIPAQUE IOHEXOL 300 MG/ML  SOLN COMPARISON:  None Available. FINDINGS: Lower chest: The visualized lung bases are clear. No intra-abdominal free air.  Small free fluid in the pelvis. Hepatobiliary: Fatty liver. Faint hypodense focus in the inferior right lobe of the liver (27/2 and 72/4) is too small to characterize. No intrahepatic biliary dilatation. Cholecystectomy. No retained calcified stone noted in the central CBD. Pancreas: There is an ill-defined 3.6 x 2.5 cm hypodense mass in the uncinate process of the pancreas. There is mild atrophy of the pancreas with mild dilatation of the main pancreatic duct. There is inflammatory changes and stranding of the mesentery inferior to the pancreas. Findings concerning for a  pancreatic malignancy (adenocarcinoma) given patient's history of unintentional weight loss. There may be superimposed pancreatitis. Correlation with pancreatic enzymes recommended. Further evaluation of the pancreatic lesion with pancreatic mass protocol MRI on a nonemergent/outpatient basis recommended. 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 No drainable fluid collection/abscess or pseudocyst. Spleen: Normal in size without focal abnormality. Adrenals/Urinary Tract: The adrenal glands are unremarkable. There is no hydronephrosis on either side. There is symmetric enhancement and excretion of contrast by both kidneys. The visualized ureters and urinary bladder appear unremarkable. Stomach/Bowel: There is no bowel obstruction or active inflammation. The appendix is normal. Vascular/Lymphatic: Mild aortoiliac atherosclerotic disease. The IVC is unremarkable. No portal venous gas. Small scattered mesenteric lymph nodes. No adenopathy. Reproductive: Hysterectomy.  No  adnexal masses. Other: Ventral hernia repair mesh. Small fat containing ventral hernia noted. There is abutment of several loops of small bowel to the anterior hernia repair mesh consistent with adhesions. Musculoskeletal: Degenerative changes of the spine. No acute osseous pathology. IMPRESSION: 1. Findings most concerning for pancreatic malignancy arising in the uncinate process of the pancreas with possible associated acute pancreatitis. Correlation with pancreatic enzymes and further evaluation of the pancreatic mass with MRI on a nonemergent/outpatient basis recommended. 2. Fatty liver. 3. No bowel obstruction. Normal appendix. 4. Aortic Atherosclerosis (ICD10-I70.0). Electronically Signed   By: Anner Crete M.D.   On: 03/22/2022 21:54    ROS:  As stated above in the HPI otherwise negative.  Blood pressure (!) 170/102, pulse 70, temperature (!) 97.1  F (36.2 C), temperature source Oral, resp. rate 16, height '5\' 6"'$  (1.676 m), weight 112.5 kg, SpO2 99 %.    PE: Gen: NAD, Alert and Oriented HEENT:  [place]/AT, EOMI Neck: Supple, no LAD Lungs: CTA Bilaterally CV: RRR without M/G/R ABD: Soft, NTND, +BS Ext: No C/C/E  Assessment/Plan: 1) Pancreatic head mass. 2) Abdominal pain. 3) Weight loss. 4) DM - uncontrolled. 50 Diarrhea.   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  Further evaluation with an EUS/FNA will be performed.  The scheduling will be difficult as there is limited time.  The plan is to perform the EUS with FNA this Friday, but she may be bumped up pending any cancellations.  As for her diarrhea, this may be from pancreatic insufficiency.  She will be started on Creon.  One of the side effects is hyperglycemia as the medication will now allow her to absorb nutrients.  Plan: 1) EUS/FNA. 2) Creon.  Thera Basden D 03/23/2022, 2:29 PM

## 2022-03-23 NOTE — ED Notes (Signed)
Patient ambulatory to restroom with cane. Attending MD in room at this time.

## 2022-03-23 NOTE — Progress Notes (Signed)
Patient arrived to room 1610 in NAD, VS stable and patient free from pain. Patient oriented to room and call bell in reach.

## 2022-03-24 DIAGNOSIS — K8689 Other specified diseases of pancreas: Secondary | ICD-10-CM | POA: Diagnosis not present

## 2022-03-24 LAB — BASIC METABOLIC PANEL
Anion gap: 8 (ref 5–15)
BUN: 7 mg/dL — ABNORMAL LOW (ref 8–23)
CO2: 27 mmol/L (ref 22–32)
Calcium: 9 mg/dL (ref 8.9–10.3)
Chloride: 101 mmol/L (ref 98–111)
Creatinine, Ser: 0.69 mg/dL (ref 0.44–1.00)
GFR, Estimated: >60 mL/min (ref >60)
Glucose, Bld: 239 mg/dL — ABNORMAL HIGH (ref 70–99)
Potassium: 3.1 mmol/L — ABNORMAL LOW (ref 3.5–5.1)
Sodium: 136 mmol/L (ref 135–145)

## 2022-03-24 LAB — CBC
HCT: 39.5 % (ref 36.0–46.0)
Hemoglobin: 12.4 g/dL (ref 12.0–15.0)
MCH: 27.5 pg (ref 26.0–34.0)
MCHC: 31.4 g/dL (ref 30.0–36.0)
MCV: 87.6 fL (ref 80.0–100.0)
Platelets: 167 10*3/uL (ref 150–400)
RBC: 4.51 MIL/uL (ref 3.87–5.11)
RDW: 13 % (ref 11.5–15.5)
WBC: 5.1 10*3/uL (ref 4.0–10.5)
nRBC: 0 % (ref 0.0–0.2)

## 2022-03-24 LAB — GLUCOSE, CAPILLARY
Glucose-Capillary: 198 mg/dL — ABNORMAL HIGH (ref 70–99)
Glucose-Capillary: 265 mg/dL — ABNORMAL HIGH (ref 70–99)

## 2022-03-24 LAB — URIC ACID: Uric Acid, Serum: 4.8 mg/dL (ref 2.5–7.1)

## 2022-03-24 MED ORDER — POTASSIUM CHLORIDE CRYS ER 20 MEQ PO TBCR
40.0000 meq | EXTENDED_RELEASE_TABLET | Freq: Once | ORAL | Status: AC
  Administered 2022-03-24: 40 meq via ORAL
  Filled 2022-03-24: qty 2

## 2022-03-24 MED ORDER — "PEN NEEDLES 3/16"" 31G X 5 MM MISC": 1.0000 | Freq: Every day | 1 refills | Status: DC

## 2022-03-24 MED ORDER — BLOOD GLUCOSE METER KIT
PACK | 0 refills | Status: DC
Start: 2022-03-24 — End: 2024-07-02

## 2022-03-24 MED ORDER — INSULIN GLARGINE-YFGN 100 UNIT/ML ~~LOC~~ SOLN: 20.0000 [IU] | Freq: Every day | SUBCUTANEOUS | Status: DC

## 2022-03-24 MED ORDER — ONDANSETRON HCL 4 MG PO TABS: 4.0000 mg | ORAL_TABLET | Freq: Four times a day (QID) | ORAL | 0 refills | Status: DC | PRN

## 2022-03-24 MED ORDER — INSULIN GLARGINE-YFGN 100 UNIT/ML ~~LOC~~ SOLN
15.0000 [IU] | Freq: Every day | SUBCUTANEOUS | Status: DC
  Filled 2022-03-24: qty 0.15

## 2022-03-24 MED ORDER — POTASSIUM CHLORIDE CRYS ER 20 MEQ PO TBCR: 40.0000 meq | EXTENDED_RELEASE_TABLET | Freq: Every day | ORAL | 0 refills | Status: DC

## 2022-03-24 MED ORDER — TRAMADOL HCL 50 MG PO TABS: 50.0000 mg | ORAL_TABLET | Freq: Three times a day (TID) | ORAL | Status: DC | PRN

## 2022-03-24 MED ORDER — TRAMADOL HCL 50 MG PO TABS
50.0000 mg | ORAL_TABLET | Freq: Three times a day (TID) | ORAL | 0 refills | Status: AC | PRN
Start: 2022-03-24 — End: 2022-03-27

## 2022-03-24 MED ORDER — PANCRELIPASE (LIP-PROT-AMYL) 36000-114000 UNITS PO CPEP: 36000.0000 [IU] | ORAL_CAPSULE | Freq: Three times a day (TID) | ORAL | 0 refills | Status: DC

## 2022-03-24 MED ORDER — INSULIN GLARGINE 100 UNIT/ML SOLOSTAR PEN: 15.0000 [IU] | PEN_INJECTOR | Freq: Every day | SUBCUTANEOUS | 11 refills | Status: DC

## 2022-03-24 NOTE — Discharge Summary (Signed)
Formatting of this note is different from the original.  Physician Discharge Summary   Lynn Blackwell ZOX:[id number] DOB: 11/19/1940 DOA: 03/22/2022    PCP: Margot Ables, MD (Inactive)    Admit date: 03/22/2022  Discharge date: 03/24/2022    Admitted From: Home   Disposition:  Home     Recommendations for Outpatient Follow-up:   Follow up with PCP in 1-2 weeks  Please obtain BMP/CBC in one week  You have an appointment at [facility] on Friday arrive at 10;30 Am.     Home Health: None    Discharge Condition: Stable.   CODE STATUS: Full code  Diet recommendation: Carb Modified     Brief/Interim Summary:  81 year old past medical history significant for diabetes type 2, hypertension, hyperlipidemia, morbid obesity BMI of 40 presents complaining of 3 weeks of diarrhea, weight loss, abdominal pain.  She reported 40 pound of weight loss since last week.  She has been having also blood sugars in the 300 range.    Evaluation in the ED lipase 45, CT abdomen pelvis with contrast showing new pancreatic mass.  Patient is status postcholecystectomy.      1-Pancreatic Mass: Abdominal pain.   Patient presents with weight loss, abdominal pain, diarrhea.  Dr. Elnoria Howard has seen consulted. Plan for EUS/FNA on Friday or sooner if schedule allows it.   MRCP: Motion limited assessment with hypoenhancing mass in the uncinate process/inferior head of the pancreas with pancreatic ductal narrowing/obstruction and early involvement of the distal common  bile duct. 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 Endoscopic assessment may be helpful to establish diagnosis. Subtle hepatic lesion in the RIGHT hepatic lobe, enhancement  characteristics cannot be assessed but this does raise the question of hepatic involvement.  EUS/FNA has been arrange for Friday at [facility] long endoscopy unit , presents at 10-;30 AM.   Will provide tramadol PRN for pain.     2-unintentional weight loss: The setting of  pancreatic mass.  GI consulted.  She will required EUS/FNA.     Uncontrolled diabetes type 2 with hyperglycemia.  Started on Lantus.  She will need insulin at discharge.  A1c at 13.  Nurse to start teaching process.   Resume glipizide at discharge.     Diarrhea; related to pancreatic insufficiency.   Improved with pancreatic  enzymes supplement.     Morbid obesity:  Lifestyle modification    Hypertension associated with diabetes:  Resume Coreg, Cardura, Lasix and Zestril    Hyperlipidemia:   Hypokalemia; replete orally. Discharge on 40 meq daily for 3 days.     Estimated body mass index is 39.03 kg/m as calculated from the following:    Height as of this encounter: 5' 6 (1.676 m).    Weight as of this encounter: 109.7 kg.        Discharge Diagnoses:   Principal Problem:    Pancreatic mass  Active Problems:    Unintentional weight loss    Hyperlipidemia associated with type 2 diabetes mellitus (HCC)    Hypertension associated with diabetes (HCC)    Morbid (severe) obesity due to excess calories (HCC)    Uncontrolled type 2 diabetes mellitus with hyperglycemia, without long-term current use of insulin [facility])    Discharge Instructions    Discharge Instructions       Diet - low sodium heart healthy   Complete by: As directed     Increase activity slowly   Complete by: As directed  Allergies as of 03/24/2022         Reactions    Nicotine Other (See Comments)    Throat closes    Other Other (See Comments)    Joint pain  Other reaction(s): Other (See Comments)  Animal Dander  From when she was tested, it was stimulated.      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    Other reaction(s): Other (See Comments)  From when she was tested, it was stimulated.      Statins Other (See Comments)    Muscle aches            Medication List       TAKE these medications      b complex vitamins tablet  Take 1 tablet by mouth daily.    calcium carbonate 600 MG Tabs tablet  Commonly known as: OS-CAL  Take 600 mg by mouth 2 (two) times daily with a meal.    carvedilol 25 MG tablet  Commonly known as: COREG  Take 50 mg by mouth 2 (two) times daily with a meal.    cholecalciferol 1000 units tablet  Commonly known as: VITAMIN D  Take 1,000 Units by mouth daily.    doxazosin 4 MG tablet  Commonly known as: CARDURA  Take 4 mg by mouth at bedtime.    furosemide 20 MG tablet  Commonly known as: LASIX  Take 20 mg by mouth daily.    glipiZIDE 5 MG 24 hr tablet  Commonly known as: GLUCOTROL XL  Take 7.5 mg by mouth daily.    insulin glargine 100 UNIT/ML Solostar Pen  Commonly known as: LANTUS  Inject 15 Units into the skin daily.    lipase/protease/amylase 16109 UNITS Cpep capsule  Commonly known as: CREON  Take 1 capsule (36,000 Units total) by mouth 3 (three) times daily before meals.    lisinopril 20 MG tablet  Commonly known as: ZESTRIL  Take 20 mg by mouth daily.    magnesium 30 MG tablet  Take 30 mg by mouth daily.    ondansetron 4 MG tablet  Commonly known as: ZOFRAN  Take 1 tablet (4 mg total) by mouth every 6 (six) hours as needed for nausea.    potassium chloride SA 20 MEQ tablet  Commonly known as: KLOR-CON M  Take 2 tablets (40 mEq total) by mouth daily for 3 days.    traMADol 50 MG tablet  Commonly known as: ULTRAM  Take 1 tablet (50 mg total) by mouth every 8 (eight) hours as needed for up to 3 days for moderate pain.           Follow-up Information       Margot Ables, MD Follow up in 1 week(s).    Specialty: Family Medicine  Why: further adjustment of insulin regimen.  Contact information:  [address] [place] [address]  [phone]           Jeani Hawking, MD Follow up.    Specialty: Gastroenterology  Why: You need to present to [facility], endoscopy unit on friday 6/16 at 10;30 am.  Contact  information:  [address]  [place] [address]  [phone]  Allergies   Allergen Reactions    Nicotine Other (See Comments)     Throat closes     Other Other (See Comments)     Joint pain  Other reaction(s): Other (See Comments)  Animal Dander  From when she was tested, it was stimulated.       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    Other reaction(s): Other (See Comments)  From when she was tested, it was stimulated.      Statins Other (See Comments)     Muscle aches     Consultations:  Dr Elnoria Howard     Procedures/Studies:  MR ABDOMEN MRCP W WO CONTAST    Result Date: 03/23/2022  CLINICAL DATA:  Suspected pancreatic mass in a patient with history of potential pancreatitis. EXAM: MRI ABDOMEN WITHOUT AND WITH CONTRAST (INCLUDING MRCP) TECHNIQUE: Multiplanar multisequence MR imaging of the abdomen was performed both before and after the administration of intravenous contrast. Heavily T2-weighted images of the biliary and pancreatic ducts were obtained, and three-dimensional MRCP images were rendered by post processing. CONTRAST:  10mL GADAVIST GADOBUTROL 1 MMOL/ML IV SOLN COMPARISON:  CT evaluation of June 12 of 2023. FINDINGS: Lower chest: Incidental imaging of the lung bases is unremarkable. Limited assessment on MRI. Hepatobiliary: Subtle hepatic lesion in the RIGHT hepatic lobe, hepatic subsegment V/VI (image 23/5) also visible on diffusion-weighted imaging. Area not well assessed on post-contrast imaging due to motion related artifact. Common bile duct is less than a cm greatest caliber in the setting of prior cholecystectomy is mildly irregular. Some narrowing distally but less narrowing that is seen of the main pancreatic duct which shows a transition in the pancreatic head  above the suspected mass in the pancreas, see below. Intrahepatic biliary ducts are moderately dilated again with changes of cholecystectomy. Pancreas: Pancreatic mass centered in the uncinate abutting the SMV with distortion of the SMV, less than 180 degree involvement by MR but with limited assessment with respect to this portion of the evaluation due to motion and susceptibility effects from adjacent bowel. Mass displays hypovascular features and measures 3.3 x 2.7 cm mainly in the uncinate process. Pancreatic duct is narrowed passing above this mass towards the ampulla and is dilated peripherally with cystic areas that are unchanged in the very short interval compared to the prior CT. Small adjacent lymph nodes are present in the small bowel mesentery and there is diffuse mesenteric edema and thickening which is nonspecific Spleen:  Normal. Adrenals/Urinary Tract:  Adrenal glands are normal. Symmetric renal enhancement without hydronephrosis. Stomach/Bowel: No acute gastrointestinal process to the extent evaluated on abdominal MRI. Vascular/Lymphatic: Scattered lymph nodes without pathologic enlargement but with some immediately adjacent to the masslike area in the head of the pancreas, within the small bowel mesentery. Other:  No ascites Musculoskeletal: No suspicious bone lesions identified. IMPRESSION: Motion limited assessment with hypoenhancing mass in the uncinate process/inferior head of the pancreas with pancreatic ductal narrowing/obstruction and early involvement of the distal common bile duct. 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 Endoscopic assessment may be helpful to establish diagnosis. Subtle hepatic lesion in the RIGHT hepatic lobe, enhancement characteristics cannot be assessed but this does raise the question of hepatic involvement. Vascular involvement is difficult to assess on  MR and on the previous CT, subsequent staging with dedicated pancreatic protocol may be  helpful. There is at least abutment of the SMV. Hazy mesentery may reflect concomitant mesenteritis or lymphatic congestion. Small lymph nodes adjacent to the masslike area in the head of the pancreas warrant close attention on follow-up and or consideration for evaluation with PET depending on further workup. Electronically Signed   By: Donzetta Kohut M.D.   On: 03/23/2022 11:49     MR 3D Recon At Scanner    Result Date: 03/23/2022  CLINICAL DATA:  Suspected pancreatic mass in a patient with history of potential pancreatitis. EXAM: MRI ABDOMEN WITHOUT AND WITH CONTRAST (INCLUDING MRCP) TECHNIQUE: Multiplanar multisequence MR imaging of the abdomen was performed both before and after the administration of intravenous contrast. Heavily T2-weighted images of the biliary and pancreatic ducts were obtained, and three-dimensional MRCP images were rendered by post processing. CONTRAST:  10mL GADAVIST GADOBUTROL 1 MMOL/ML IV SOLN COMPARISON:  CT evaluation of June 12 of 2023. FINDINGS: Lower chest: Incidental imaging of the lung bases is unremarkable. Limited assessment on MRI. Hepatobiliary: Subtle hepatic lesion in the RIGHT hepatic lobe, hepatic subsegment V/VI (image 23/5) also visible on diffusion-weighted imaging. Area not well assessed on post-contrast imaging due to motion related artifact. Common bile duct is less than a cm greatest caliber in the setting of prior cholecystectomy is mildly irregular. Some narrowing distally but less narrowing that is seen of the main pancreatic duct which shows a transition in the pancreatic head above the suspected mass in the pancreas, see below. Intrahepatic biliary ducts are moderately dilated again with changes of cholecystectomy. Pancreas: Pancreatic mass centered in the uncinate abutting the SMV with distortion of the SMV, less than 180 degree involvement by MR but with limited assessment with respect to this portion of the evaluation due to motion and susceptibility effects  from adjacent bowel. Mass displays hypovascular features and measures 3.3 x 2.7 cm mainly in the uncinate process. Pancreatic duct is narrowed passing above this mass towards the ampulla and is dilated peripherally with cystic areas that are unchanged in the very short interval compared to the prior CT. Small adjacent lymph nodes are present in the small bowel mesentery and there is diffuse mesenteric edema and thickening which is nonspecific Spleen:  Normal. Adrenals/Urinary Tract:  Adrenal glands are normal. Symmetric renal enhancement without hydronephrosis. Stomach/Bowel: No acute gastrointestinal process to the extent evaluated on abdominal MRI. Vascular/Lymphatic: Scattered lymph nodes without pathologic enlargement but with some immediately adjacent to the masslike area in the head of the pancreas, within the small bowel mesentery. Other:  No ascites Musculoskeletal: No suspicious bone lesions identified. IMPRESSION: Motion limited assessment with hypoenhancing mass in the uncinate process/inferior head of the pancreas with pancreatic ductal narrowing/obstruction and early involvement of the distal common bile duct. 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 Endoscopic assessment may be helpful to establish diagnosis. Subtle hepatic lesion in the RIGHT hepatic lobe, enhancement characteristics cannot be assessed but this does raise the question of hepatic involvement. Vascular involvement is difficult to assess on MR and on the previous CT, subsequent staging with dedicated pancreatic protocol may be helpful. There is at least abutment of the SMV. Hazy mesentery may reflect concomitant mesenteritis or lymphatic congestion. Small lymph nodes adjacent to the masslike area in the head of the pancreas warrant close attention on follow-up and or consideration for evaluation with PET depending on further workup. Electronically Signed   By: Jason Fila.D.  On: 03/23/2022 11:49     CT Abdomen  Pelvis W Contrast    Result Date: 03/22/2022  CLINICAL DATA:  Acute abdominal pain.  An intentional weight loss. EXAM: CT ABDOMEN AND PELVIS WITH CONTRAST TECHNIQUE: Multidetector CT imaging of the abdomen and pelvis was performed using the standard protocol following bolus administration of intravenous contrast. RADIATION DOSE REDUCTION: This exam was performed according to the departmental dose-optimization program which includes automated exposure control, adjustment of the mA and/or kV according to patient size and/or use of iterative reconstruction technique. CONTRAST:  OMNIPAQUE IOHEXOL 300 MG/ML  SOLN COMPARISON:  None Available. FINDINGS: Lower chest: The visualized lung bases are clear. No intra-abdominal free air.  Small free fluid in the pelvis. Hepatobiliary: Fatty liver. Faint hypodense focus in the inferior right lobe of the liver (27/2 and 72/4) is too small to characterize. No intrahepatic biliary dilatation. Cholecystectomy. No retained calcified stone noted in the central CBD. Pancreas: There is an ill-defined 3.6 x 2.5 cm hypodense mass in the uncinate process of the pancreas. There is mild atrophy of the pancreas with mild dilatation of the main pancreatic duct. There is inflammatory changes and stranding of the mesentery inferior to the pancreas. 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 There may be superimposed pancreatitis. Correlation with pancreatic enzymes recommended. Further evaluation of the pancreatic lesion with pancreatic mass protocol MRI on a nonemergent/outpatient basis recommended. 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 No drainable fluid collection/abscess or pseudocyst. Spleen: Normal in size without focal abnormality. Adrenals/Urinary Tract: The adrenal glands are unremarkable. There is no hydronephrosis  on either side. There is symmetric enhancement and excretion of contrast by both kidneys. The visualized ureters and urinary bladder appear unremarkable. Stomach/Bowel: There is no bowel obstruction or active inflammation. The appendix is normal. Vascular/Lymphatic: Mild aortoiliac atherosclerotic disease. The IVC is unremarkable. No portal venous gas. Small scattered mesenteric lymph nodes. No adenopathy. Reproductive: Hysterectomy.  No adnexal masses. Other: Ventral hernia repair mesh. Small fat containing ventral hernia noted. There is abutment of several loops of small bowel to the anterior hernia repair mesh consistent with adhesions. Musculoskeletal: Degenerative changes of the spine. No acute osseous pathology. IMPRESSION: 1. Findings most concerning for pancreatic malignancy arising in the uncinate process of the pancreas with possible associated acute pancreatitis. Correlation with pancreatic enzymes and further evaluation of the pancreatic mass with MRI on a nonemergent/outpatient basis recommended. 2. Fatty liver. 3. No bowel obstruction. Normal appendix. 4. Aortic Atherosclerosis (ICD10-I70.0). Electronically Signed   By: Elgie Collard M.D.   On: 03/22/2022 21:54      Subjective:  She has been tolerating diet     Discharge Exam:  Vitals:    03/24/22 0400 03/24/22 0819   BP: (!) 158/81 (!) 151/69   Pulse: 70 65   Resp: 16    Temp: (!) 97.5 F (36.4 C)    SpO2: 94% 94%     General: Pt is alert, awake, not in acute distress  Cardiovascular: RRR, S1/S2 +, no rubs, no gallops  Respiratory: CTA bilaterally, no wheezing, no rhonchi  Abdominal: Soft, NT, ND, bowel sounds +  Extremities: no edema, no cyanosis    The results of significant diagnostics from this hospitalization (including imaging, microbiology, ancillary and laboratory) are listed below for reference.      Microbiology:  No results found for this or any previous  visit (from the past 240 hour(s)).     Labs:  BNP (last 3 results)  No results for  input(s): BNP in the last 8760 hours.  Basic Metabolic Panel:  Recent Labs   Lab 03/22/22  1754 03/23/22  0340 03/24/22  1215   NA 135 138 136   K 3.2* 3.0* 3.1*   CL 102 106 101   CO2 24 27 27    GLUCOSE 356* 245* 239*   BUN 9 7* 7*   CREATININE 0.71 0.58 0.69   CALCIUM 9.0 8.8* 9.0     Liver Function Tests:  Recent Labs   Lab 03/22/22  1754 03/23/22  0340   AST 28 23   ALT 22 20   ALKPHOS 81 78   BILITOT 0.9 0.8   PROT 7.7 6.8   ALBUMIN 3.5 3.1*     Recent Labs   Lab 03/22/22  1754   LIPASE 45     No results for input(s): AMMONIA in the last 168 hours.  CBC:  Recent Labs   Lab 03/22/22  1754 03/23/22  0340 03/24/22  1215   WBC 6.1 6.3 5.1   NEUTROABS 3.7 3.5  --    HGB 12.4 11.7* 12.4   HCT 38.6 35.5* 39.5   MCV 86.0 85.7 87.6   PLT 167 148* 167     Cardiac Enzymes:  No results for input(s): CKTOTAL, CKMB, CKMBINDEX, TROPONINI in the last 168 hours.  BNP:  Invalid input(s): POCBNP  CBG:  Recent Labs   Lab 03/23/22  1137 03/23/22  1643 03/23/22  2250 03/24/22  0806 03/24/22  1158   GLUCAP 175* 158* 251* 198* 265*     D-Dimer  No results for input(s): DDIMER in the last 72 hours.  Hgb A1c  Recent Labs     03/22/22  1805   HGBA1C 13.0*     Lipid Profile  No results for input(s): CHOL, HDL, LDLCALC, TRIG, CHOLHDL, LDLDIRECT in the last 72 hours.  Thyroid function studies  No results for input(s): TSH, T4TOTAL, T3FREE, THYROIDAB in the last 72 hours.    Invalid input(s): FREET3  Anemia work up  No results for input(s): VITAMINB12, FOLATE, FERRITIN, TIBC, IRON, RETICCTPCT in the last 72 hours.  Urinalysis    Component Value Date/Time    COLORURINE YELLOW 03/22/2022 1746    APPEARANCEUR CLEAR 03/22/2022 1746    LABSPEC 1.027 03/22/2022 1746    PHURINE 5.0 03/22/2022 1746    GLUCOSEU >=500 (A) 03/22/2022 1746    HGBUR NEGATIVE 03/22/2022 1746    BILIRUBINUR NEGATIVE 03/22/2022 1746    KETONESUR 20 (A) 03/22/2022 1746    PROTEINUR NEGATIVE 03/22/2022 1746    NITRITE NEGATIVE  03/22/2022 1746    LEUKOCYTESUR NEGATIVE 03/22/2022 1746     Sepsis Labs  Recent Labs   Lab 03/22/22  1754 03/23/22  0340 03/24/22  1215   WBC 6.1 6.3 5.1     Microbiology  No results found for this or any previous visit (from the past 240 hour(s)).    Time coordinating discharge: 40 minutes    SIGNED:    Alba Cory, MD   [facility]    Electronically signed by Alba Cory, MD at 03/24/2022  2:37 PM EDT

## 2022-03-24 NOTE — Discharge Summary (Addendum)
Physician Discharge Summary  Sarah Davies:[id number] DOB: 05-29-1941 DOA: 03/22/2022  PCP: Buzzy Han, MD (Inactive)  Admit date: 03/22/2022 Discharge date: 03/24/2022  Admitted From: Home  Disposition:  Home   Recommendations for Outpatient Follow-up:  Follow up with PCP in 1-2 weeks Please obtain BMP/CBC in one week You have an appointment at [facility] on Friday arrive at 10;30 Am.   Home Health: None  Discharge Condition: Stable.  CODE STATUS: Full code Diet recommendation: Carb Modified    Brief/Interim Summary: 81 year old past medical history significant for diabetes type 2, hypertension, hyperlipidemia, morbid obesity BMI of 40 presents complaining of 3 weeks of diarrhea, weight loss, abdominal pain.  She reported 40 pound of weight loss since last week.  She has been having also blood sugars in the 300 range.   Evaluation in the ED lipase 45, CT abdomen pelvis with contrast showing new pancreatic mass.  Patient is status postcholecystectomy.    1-Pancreatic Mass: Abdominal pain.  Patient presents with weight loss, abdominal pain, diarrhea. Dr. Benson Norway has seen consulted. Plan for EUS/FNA on Friday or sooner if schedule allows it.  MRCP: Motion limited assessment with hypoenhancing mass in the uncinate process/inferior head of the pancreas with pancreatic ductal narrowing/obstruction and early involvement of the distal common bile duct. 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 Endoscopic assessment may be helpful to establish diagnosis. Subtle hepatic lesion in the RIGHT hepatic lobe, enhancement characteristics cannot be assessed but this does raise the question of hepatic involvement. EUS/FNA has been arrange for Friday at [facility] long endoscopy unit , presents at 10-;30 AM.  Will provide tramadol PRN for pain.   2-unintentional weight loss: The setting of pancreatic mass. GI consulted. She will required EUS/FNA.     Uncontrolled diabetes type 2 with hyperglycemia. Started on Lantus.  She will need insulin at discharge.  A1c at 13. Nurse to start teaching process.  Resume glipizide at discharge.   Diarrhea; related to pancreatic insufficiency.  Improved with pancreatic  enzymes supplement.   Morbid obesity: Lifestyle modification   Hypertension associated with diabetes: Resume Coreg, Cardura, Lasix and Zestril   Hyperlipidemia:  Hypokalemia; replete orally. Discharge on 40 meq daily for 3 days.    Estimated body mass index is 39.03 kg/m as calculated from the following:   Height as of this encounter: '5\' 6"'$  (1.676 m).   Weight as of this encounter: 109.7 kg.      Discharge Diagnoses:  Principal Problem:   Pancreatic mass Active Problems:   Unintentional weight loss   Hyperlipidemia associated with type 2 diabetes mellitus ([place])   Hypertension associated with diabetes ([place])   Morbid (severe) obesity due to excess calories ([place])   Uncontrolled type 2 diabetes mellitus with hyperglycemia, without long-term current use of insulin [facility])    Discharge Instructions  Discharge Instructions     Diet - low sodium heart healthy   Complete by: As directed    Increase activity slowly   Complete by: As directed       Allergies as of 03/24/2022       Reactions   Nicotine Other (See Comments)   Throat closes   Other Other (See Comments)   Joint pain Other reaction(s): Other (See Comments) Animal Dander From when she was tested, it was stimulated.     Shellfish Allergy Other (See Comments)   Has never had a reaction, was just told through allergy testing that she is allergic to shellfish "Throat closes"  Molds & Smuts Other (See Comments)   From when she was tested, it was stimulated.   Other reaction(s): Other (See Comments) From when she was tested, it was stimulated.     Statins Other (See Comments)   Muscle aches        Medication List     TAKE these medications    b  complex vitamins tablet Take 1 tablet by mouth daily.   calcium carbonate 600 MG Tabs tablet Commonly known as: OS-CAL Take 600 mg by mouth 2 (two) times daily with a meal.   carvedilol 25 MG tablet Commonly known as: COREG Take 50 mg by mouth 2 (two) times daily with a meal.   cholecalciferol 1000 units tablet Commonly known as: VITAMIN D Take 1,000 Units by mouth daily.   doxazosin 4 MG tablet Commonly known as: CARDURA Take 4 mg by mouth at bedtime.   furosemide 20 MG tablet Commonly known as: LASIX Take 20 mg by mouth daily.   glipiZIDE 5 MG 24 hr tablet Commonly known as: GLUCOTROL XL Take 7.5 mg by mouth daily.   insulin glargine 100 UNIT/ML Solostar Pen Commonly known as: LANTUS Inject 15 Units into the skin daily.   lipase/protease/amylase 36000 UNITS Cpep capsule Commonly known as: CREON Take 1 capsule (36,000 Units total) by mouth 3 (three) times daily before meals.   lisinopril 20 MG tablet Commonly known as: ZESTRIL Take 20 mg by mouth daily.   magnesium 30 MG tablet Take 30 mg by mouth daily.   ondansetron 4 MG tablet Commonly known as: ZOFRAN Take 1 tablet (4 mg total) by mouth every 6 (six) hours as needed for nausea.   potassium chloride SA 20 MEQ tablet Commonly known as: KLOR-CON M Take 2 tablets (40 mEq total) by mouth daily for 3 days.   traMADol 50 MG tablet Commonly known as: ULTRAM Take 1 tablet (50 mg total) by mouth every 8 (eight) hours as needed for up to 3 days for moderate pain.        Follow-up Information     Buzzy Han, MD Follow up in 1 week(s).   Specialty: Family Medicine Why: further adjustment of insulin regimen. Contact information: [place] [address] [phone]         Carol Ada, MD Follow up.   Specialty: Gastroenterology Why: You need to present to [facility] long [facility], endoscopy unit on friday 6/16 at 10;30 am. Contact information: [place],  [place] [address] [phone]                Allergies  Allergen Reactions   Nicotine Other (See Comments)    Throat closes    Other Other (See Comments)    Joint pain Other reaction(s): Other (See Comments) Animal Dander From when she was tested, it was stimulated.      Shellfish Allergy Other (See Comments)    Has never had a reaction, was just told through allergy testing that she is allergic to shellfish "Throat closes"   Molds & Smuts Other (See Comments)    From when she was tested, it was stimulated.   Other reaction(s): Other (See Comments) From when she was tested, it was stimulated.     Statins Other (See Comments)    Muscle aches    Consultations: Dr Benson Norway    Procedures/Studies: MR ABDOMEN MRCP W WO CONTAST  Result Date: 03/23/2022 CLINICAL DATA:  Suspected pancreatic mass in a patient with history of potential pancreatitis. EXAM:  MRI ABDOMEN WITHOUT AND WITH CONTRAST (INCLUDING MRCP) TECHNIQUE: Multiplanar multisequence MR imaging of the abdomen was performed both before and after the administration of intravenous contrast. Heavily T2-weighted images of the biliary and pancreatic ducts were obtained, and three-dimensional MRCP images were rendered by post processing. CONTRAST:  36m GADAVIST GADOBUTROL 1 MMOL/ML IV SOLN COMPARISON:  CT evaluation of June 12 of 2023. FINDINGS: Lower chest: Incidental imaging of the lung bases is unremarkable. Limited assessment on MRI. Hepatobiliary: Subtle hepatic lesion in the RIGHT hepatic lobe, hepatic subsegment V/VI (image 23/5) also visible on diffusion-weighted imaging. Area not well assessed on post-contrast imaging due to motion related artifact. Common bile duct is less than a cm greatest caliber in the setting of prior cholecystectomy is mildly irregular. Some narrowing distally but less narrowing that is seen of the main pancreatic duct which shows a transition in the pancreatic head above the suspected mass  in the pancreas, see below. Intrahepatic biliary ducts are moderately dilated again with changes of cholecystectomy. Pancreas: Pancreatic mass centered in the uncinate abutting the SMV with distortion of the SMV, less than 180 degree involvement by MR but with limited assessment with respect to this portion of the evaluation due to motion and susceptibility effects from adjacent bowel. Mass displays hypovascular features and measures 3.3 x 2.7 cm mainly in the uncinate process. Pancreatic duct is narrowed passing above this mass towards the ampulla and is dilated peripherally with cystic areas that are unchanged in the very short interval compared to the prior CT. Small adjacent lymph nodes are present in the small bowel mesentery and there is diffuse mesenteric edema and thickening which is nonspecific Spleen:  Normal. Adrenals/Urinary Tract:  Adrenal glands are normal. Symmetric renal enhancement without hydronephrosis. Stomach/Bowel: No acute gastrointestinal process to the extent evaluated on abdominal MRI. Vascular/Lymphatic: Scattered lymph nodes without pathologic enlargement but with some immediately adjacent to the masslike area in the head of the pancreas, within the small bowel mesentery. Other:  No ascites Musculoskeletal: No suspicious bone lesions identified. IMPRESSION: Motion limited assessment with hypoenhancing mass in the uncinate process/inferior head of the pancreas with pancreatic ductal narrowing/obstruction and early involvement of the distal common bile duct. 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 Endoscopic assessment may be helpful to establish diagnosis. Subtle hepatic lesion in the RIGHT hepatic lobe, enhancement characteristics cannot be assessed but this does raise the question of hepatic involvement. Vascular involvement is difficult to assess on MR and on the previous CT, subsequent staging with dedicated pancreatic protocol may be helpful. There is at least  abutment of the SMV. Hazy mesentery may reflect concomitant mesenteritis or lymphatic congestion. Small lymph nodes adjacent to the masslike area in the head of the pancreas warrant close attention on follow-up and or consideration for evaluation with PET depending on further workup. Electronically Signed   By: GZetta BillsM.D.   On: 03/23/2022 11:49   MR 3D Recon At Scanner  Result Date: 03/23/2022 CLINICAL DATA:  Suspected pancreatic mass in a patient with history of potential pancreatitis. EXAM: MRI ABDOMEN WITHOUT AND WITH CONTRAST (INCLUDING MRCP) TECHNIQUE: Multiplanar multisequence MR imaging of the abdomen was performed both before and after the administration of intravenous contrast. Heavily T2-weighted images of the biliary and pancreatic ducts were obtained, and three-dimensional MRCP images were rendered by post processing. CONTRAST:  173mGADAVIST GADOBUTROL 1 MMOL/ML IV SOLN COMPARISON:  CT evaluation of June 12 of 2023. FINDINGS: Lower chest: Incidental imaging of the lung bases is unremarkable. Limited  assessment on MRI. Hepatobiliary: Subtle hepatic lesion in the RIGHT hepatic lobe, hepatic subsegment V/VI (image 23/5) also visible on diffusion-weighted imaging. Area not well assessed on post-contrast imaging due to motion related artifact. Common bile duct is less than a cm greatest caliber in the setting of prior cholecystectomy is mildly irregular. Some narrowing distally but less narrowing that is seen of the main pancreatic duct which shows a transition in the pancreatic head above the suspected mass in the pancreas, see below. Intrahepatic biliary ducts are moderately dilated again with changes of cholecystectomy. Pancreas: Pancreatic mass centered in the uncinate abutting the SMV with distortion of the SMV, less than 180 degree involvement by MR but with limited assessment with respect to this portion of the evaluation due to motion and susceptibility effects from adjacent bowel. Mass  displays hypovascular features and measures 3.3 x 2.7 cm mainly in the uncinate process. Pancreatic duct is narrowed passing above this mass towards the ampulla and is dilated peripherally with cystic areas that are unchanged in the very short interval compared to the prior CT. Small adjacent lymph nodes are present in the small bowel mesentery and there is diffuse mesenteric edema and thickening which is nonspecific Spleen:  Normal. Adrenals/Urinary Tract:  Adrenal glands are normal. Symmetric renal enhancement without hydronephrosis. Stomach/Bowel: No acute gastrointestinal process to the extent evaluated on abdominal MRI. Vascular/Lymphatic: Scattered lymph nodes without pathologic enlargement but with some immediately adjacent to the masslike area in the head of the pancreas, within the small bowel mesentery. Other:  No ascites Musculoskeletal: No suspicious bone lesions identified. IMPRESSION: Motion limited assessment with hypoenhancing mass in the uncinate process/inferior head of the pancreas with pancreatic ductal narrowing/obstruction and early involvement of the distal common bile duct. 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 Endoscopic assessment may be helpful to establish diagnosis. Subtle hepatic lesion in the RIGHT hepatic lobe, enhancement characteristics cannot be assessed but this does raise the question of hepatic involvement. Vascular involvement is difficult to assess on MR and on the previous CT, subsequent staging with dedicated pancreatic protocol may be helpful. There is at least abutment of the SMV. Hazy mesentery may reflect concomitant mesenteritis or lymphatic congestion. Small lymph nodes adjacent to the masslike area in the head of the pancreas warrant close attention on follow-up and or consideration for evaluation with PET depending on further workup. Electronically Signed   By: Zetta Bills M.D.   On: 03/23/2022 11:49   CT Abdomen Pelvis W Contrast  Result  Date: 03/22/2022 CLINICAL DATA:  Acute abdominal pain.  An intentional weight loss. EXAM: CT ABDOMEN AND PELVIS WITH CONTRAST TECHNIQUE: Multidetector CT imaging of the abdomen and pelvis was performed using the standard protocol following bolus administration of intravenous contrast. RADIATION DOSE REDUCTION: This exam was performed according to the departmental dose-optimization program which includes automated exposure control, adjustment of the mA and/or kV according to patient size and/or use of iterative reconstruction technique. CONTRAST:  137m OMNIPAQUE IOHEXOL 300 MG/ML  SOLN COMPARISON:  None Available. FINDINGS: Lower chest: The visualized lung bases are clear. No intra-abdominal free air.  Small free fluid in the pelvis. Hepatobiliary: Fatty liver. Faint hypodense focus in the inferior right lobe of the liver (27/2 and 72/4) is too small to characterize. No intrahepatic biliary dilatation. Cholecystectomy. No retained calcified stone noted in the central CBD. Pancreas: There is an ill-defined 3.6 x 2.5 cm hypodense mass in the uncinate process of the pancreas. There is mild atrophy of the pancreas with mild  dilatation of the main pancreatic duct. There is inflammatory changes and stranding of the mesentery inferior to the pancreas. 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 There may be superimposed pancreatitis. Correlation with pancreatic enzymes recommended. Further evaluation of the pancreatic lesion with pancreatic mass protocol MRI on a nonemergent/outpatient basis recommended. 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 No drainable fluid collection/abscess or pseudocyst. Spleen: Normal in size without focal abnormality. Adrenals/Urinary Tract: The adrenal glands are unremarkable. There is no hydronephrosis on either side. There is  symmetric enhancement and excretion of contrast by both kidneys. The visualized ureters and urinary bladder appear unremarkable. Stomach/Bowel: There is no bowel obstruction or active inflammation. The appendix is normal. Vascular/Lymphatic: Mild aortoiliac atherosclerotic disease. The IVC is unremarkable. No portal venous gas. Small scattered mesenteric lymph nodes. No adenopathy. Reproductive: Hysterectomy.  No adnexal masses. Other: Ventral hernia repair mesh. Small fat containing ventral hernia noted. There is abutment of several loops of small bowel to the anterior hernia repair mesh consistent with adhesions. Musculoskeletal: Degenerative changes of the spine. No acute osseous pathology. IMPRESSION: 1. Findings most concerning for pancreatic malignancy arising in the uncinate process of the pancreas with possible associated acute pancreatitis. Correlation with pancreatic enzymes and further evaluation of the pancreatic mass with MRI on a nonemergent/outpatient basis recommended. 2. Fatty liver. 3. No bowel obstruction. Normal appendix. 4. Aortic Atherosclerosis (ICD10-I70.0). Electronically Signed   By: Anner Crete M.D.   On: 03/22/2022 21:54     Subjective: She has been tolerating diet   Discharge Exam: Vitals:   03/24/22 0400 03/24/22 0819  BP: (!) 158/81 (!) 151/69  Pulse: 70 65  Resp: 16   Temp: (!) 97.5 F (36.4 C)   SpO2: 94% 94%     General: Pt is alert, awake, not in acute distress Cardiovascular: RRR, S1/S2 +, no rubs, no gallops Respiratory: CTA bilaterally, no wheezing, no rhonchi Abdominal: Soft, NT, ND, bowel sounds + Extremities: no edema, no cyanosis    The results of significant diagnostics from this hospitalization (including imaging, microbiology, ancillary and laboratory) are listed below for reference.     Microbiology: No results found for this or any previous visit (from the past 240 hour(s)).   Labs: BNP (last 3 results) No results for input(s):  "BNP" in the last 8760 hours. Basic Metabolic Panel: Recent Labs  Lab 03/22/22 1754 03/23/22 0340 03/24/22 1215  NA 135 138 136  K 3.2* 3.0* 3.1*  CL 102 106 101  CO2 '24 27 27  '$ GLUCOSE 356* 245* 239*  BUN 9 7* 7*  CREATININE 0.71 0.58 0.69  CALCIUM 9.0 8.8* 9.0   Liver Function Tests: Recent Labs  Lab 03/22/22 1754 03/23/22 0340  AST 28 23  ALT 22 20  ALKPHOS 81 78  BILITOT 0.9 0.8  PROT 7.7 6.8  ALBUMIN 3.5 3.1*   Recent Labs  Lab 03/22/22 1754  LIPASE 45   No results for input(s): "AMMONIA" in the last 168 hours. CBC: Recent Labs  Lab 03/22/22 1754 03/23/22 0340 03/24/22 1215  WBC 6.1 6.3 5.1  NEUTROABS 3.7 3.5  --   HGB 12.4 11.7* 12.4  HCT 38.6 35.5* 39.5  MCV 86.0 85.7 87.6  PLT 167 148* 167   Cardiac Enzymes: No results for input(s): "CKTOTAL", "CKMB", "CKMBINDEX", "TROPONINI" in the last 168 hours. BNP: Invalid input(s): "POCBNP" CBG: Recent Labs  Lab  03/23/22 1137 03/23/22 1643 03/23/22 2250 03/24/22 0806 03/24/22 1158  GLUCAP 175* 158* 251* 198* 265*   D-Dimer No results for input(s): "DDIMER" in the last 72 hours. Hgb A1c Recent Labs    03/22/22 1805  HGBA1C 13.0*   Lipid Profile No results for input(s): "CHOL", "HDL", "LDLCALC", "TRIG", "CHOLHDL", "LDLDIRECT" in the last 72 hours. Thyroid function studies No results for input(s): "TSH", "T4TOTAL", "T3FREE", "THYROIDAB" in the last 72 hours.  Invalid input(s): "FREET3" Anemia work up No results for input(s): "VITAMINB12", "FOLATE", "FERRITIN", "TIBC", "IRON", "RETICCTPCT" in the last 72 hours. Urinalysis    Component Value Date/Time   COLORURINE YELLOW 03/22/2022 1746   APPEARANCEUR CLEAR 03/22/2022 1746   LABSPEC 1.027 03/22/2022 1746   PHURINE 5.0 03/22/2022 1746   GLUCOSEU >=500 (A) 03/22/2022 1746   HGBUR NEGATIVE 03/22/2022 1746   BILIRUBINUR NEGATIVE 03/22/2022 1746   KETONESUR 20 (A) 03/22/2022 1746   PROTEINUR NEGATIVE 03/22/2022 1746   NITRITE NEGATIVE  03/22/2022 1746   LEUKOCYTESUR NEGATIVE 03/22/2022 1746   Sepsis Labs Recent Labs  Lab 03/22/22 1754 03/23/22 0340 03/24/22 1215  WBC 6.1 6.3 5.1   Microbiology No results found for this or any previous visit (from the past 240 hour(s)).   Time coordinating discharge: 40 minutes  SIGNED:   Elmarie Shiley, MD  [facility]

## 2022-03-25 ENCOUNTER — Other Ambulatory Visit: Payer: Self-pay | Admitting: Gastroenterology

## 2022-03-26 ENCOUNTER — Other Ambulatory Visit: Payer: Self-pay

## 2022-03-26 ENCOUNTER — Encounter (HOSPITAL_COMMUNITY)
Admission: RE | Disposition: A | Discharge status: Home / Self Care | Payer: Self-pay | Source: Home / Self Care | Attending: Gastroenterology

## 2022-03-26 ENCOUNTER — Ambulatory Visit (HOSPITAL_BASED_OUTPATIENT_CLINIC_OR_DEPARTMENT_OTHER): Payer: Medicare PPO | Admitting: Anesthesiology

## 2022-03-26 ENCOUNTER — Encounter (HOSPITAL_COMMUNITY): Payer: Self-pay | Admitting: Gastroenterology

## 2022-03-26 ENCOUNTER — Ambulatory Visit (HOSPITAL_COMMUNITY)
Admission: RE | Admit: 2022-03-26 | Discharge: 2022-03-26 | Disposition: A | Discharge status: Home / Self Care | Payer: Medicare PPO | Attending: Gastroenterology | Admitting: Gastroenterology

## 2022-03-26 ENCOUNTER — Ambulatory Visit (HOSPITAL_COMMUNITY): Payer: Medicare PPO | Admitting: Anesthesiology

## 2022-03-26 DIAGNOSIS — K838 Other specified diseases of biliary tract: Secondary | ICD-10-CM | POA: Diagnosis not present

## 2022-03-26 DIAGNOSIS — I10 Essential (primary) hypertension: Secondary | ICD-10-CM | POA: Diagnosis not present

## 2022-03-26 DIAGNOSIS — K8689 Other specified diseases of pancreas: Secondary | ICD-10-CM | POA: Insufficient documentation

## 2022-03-26 DIAGNOSIS — Z794 Long term (current) use of insulin: Secondary | ICD-10-CM

## 2022-03-26 DIAGNOSIS — E119 Type 2 diabetes mellitus without complications: Secondary | ICD-10-CM | POA: Insufficient documentation

## 2022-03-26 DIAGNOSIS — Z7984 Long term (current) use of oral hypoglycemic drugs: Secondary | ICD-10-CM | POA: Insufficient documentation

## 2022-03-26 HISTORY — PX: EUS: SHX5427

## 2022-03-26 HISTORY — PX: FINE NEEDLE ASPIRATION: SHX5430

## 2022-03-26 HISTORY — PX: ESOPHAGOGASTRODUODENOSCOPY (EGD) WITH PROPOFOL: SHX5813

## 2022-03-26 LAB — GLUCOSE, CAPILLARY
Glucose-Capillary: 294 mg/dL — ABNORMAL HIGH (ref 70–99)
Glucose-Capillary: 263 mg/dL — ABNORMAL HIGH (ref 70–99)
Glucose-Capillary: 204 mg/dL — ABNORMAL HIGH (ref 70–99)

## 2022-03-26 SURGERY — UPPER ENDOSCOPIC ULTRASOUND (EUS) LINEAR
Anesthesia: Monitor Anesthesia Care

## 2022-03-26 MED ORDER — PROPOFOL 500 MG/50ML IV EMUL
INTRAVENOUS | Status: DC | PRN
  Administered 2022-03-26: 125 ug/kg/min via INTRAVENOUS

## 2022-03-26 MED ORDER — LACTATED RINGERS IV SOLN
INTRAVENOUS | Status: AC | PRN
  Administered 2022-03-26: 10 mL/h via INTRAVENOUS

## 2022-03-26 MED ORDER — INSULIN ASPART 100 UNIT/ML IJ SOLN
6.0000 [IU] | Freq: Once | INTRAMUSCULAR | Status: AC
Start: 2022-03-26 — End: 2022-03-26
  Administered 2022-03-26: 6 [IU] via SUBCUTANEOUS

## 2022-03-26 MED ORDER — AMISULPRIDE (ANTIEMETIC) 5 MG/2ML IV SOLN
10.0000 mg | Freq: Once | INTRAVENOUS | Status: DC | PRN
  Filled 2022-03-26: qty 4

## 2022-03-26 MED ORDER — ONDANSETRON HCL 4 MG/2ML IJ SOLN: 4.0000 mg | Freq: Once | INTRAMUSCULAR | Status: DC | PRN

## 2022-03-26 MED ORDER — INSULIN ASPART 100 UNIT/ML IJ SOLN
INTRAMUSCULAR | Status: AC
  Filled 2022-03-26: qty 1

## 2022-03-26 MED ORDER — PROPOFOL 10 MG/ML IV BOLUS
INTRAVENOUS | Status: DC | PRN
  Administered 2022-03-26 (×2): 20 mg via INTRAVENOUS

## 2022-03-26 MED ORDER — LIDOCAINE 2% (20 MG/ML) 5 ML SYRINGE
INTRAMUSCULAR | Status: DC | PRN
  Administered 2022-03-26: 100 mg via INTRAVENOUS

## 2022-03-26 NOTE — Anesthesia Preprocedure Evaluation (Signed)
Anesthesia Evaluation  Patient identified by MRN, date of birth, ID band Patient awake    Reviewed: Allergy & Precautions, NPO status , Patient's Chart, lab work & pertinent test results  Airway Mallampati: II  TM Distance: >3 FB Neck ROM: Full    Dental  (+) Teeth Intact, Dental Advisory Given   Pulmonary neg pulmonary ROS,    breath sounds clear to auscultation       Cardiovascular hypertension, Pt. on home beta blockers  Rhythm:Regular Rate:Normal     Neuro/Psych negative neurological ROS  negative psych ROS   GI/Hepatic negative GI ROS, Neg liver ROS,   Endo/Other  diabetes, Type 2, Oral Hypoglycemic Agents, Insulin Dependent  Renal/GU negative Renal ROS     Musculoskeletal negative musculoskeletal ROS (+)   Abdominal Normal abdominal exam  (+)   Peds  Hematology negative hematology ROS (+)   Anesthesia Other Findings   Reproductive/Obstetrics                             Anesthesia Physical Anesthesia Plan  ASA: 2  Anesthesia Plan: MAC   Post-op Pain Management:    Induction: Intravenous  PONV Risk Score and Plan: 0 and Propofol infusion  Airway Management Planned: Natural Airway and Simple Face Mask  Additional Equipment: None  Intra-op Plan:   Post-operative Plan:   Informed Consent: I have reviewed the patients History and Physical, chart, labs and discussed the procedure including the risks, benefits and alternatives for the proposed anesthesia with the patient or authorized representative who has indicated his/her understanding and acceptance.       Plan Discussed with: CRNA  Anesthesia Plan Comments:         Anesthesia Quick Evaluation

## 2022-03-26 NOTE — Transfer of Care (Signed)
Immediate Anesthesia Transfer of Care Note  Patient: Sarah Davies  Procedure(s) Performed: UPPER ENDOSCOPIC ULTRASOUND (EUS) LINEAR FINE NEEDLE ASPIRATION (FNA) LINEAR  Patient Location: Endoscopy Unit  Anesthesia Type:MAC  Level of Consciousness: awake, alert  and oriented  Airway & Oxygen Therapy: Patient Spontanous Breathing and Patient connected to face mask oxygen  Post-op Assessment: Report given to RN and Post -op Vital signs reviewed and stable  Post vital signs: Reviewed and stable  Last Vitals:  Vitals Value Taken Time  BP 137/65 03/26/22 1514  Temp    Pulse 65 03/26/22 1515  Resp 15 03/26/22 1515  SpO2 100 % 03/26/22 1515  Vitals shown include unvalidated device data.  Last Pain:  Vitals:   03/26/22 1045  TempSrc: Temporal  PainSc: 0-No pain         Complications: No notable events documented.

## 2022-03-26 NOTE — Anesthesia Postprocedure Evaluation (Signed)
Anesthesia Post Note  Patient: Sarah Davies  Procedure(s) Performed: UPPER ENDOSCOPIC ULTRASOUND (EUS) LINEAR FINE NEEDLE ASPIRATION (FNA) LINEAR     Patient location during evaluation: PACU Anesthesia Type: MAC Level of consciousness: awake and alert Pain management: pain level controlled Vital Signs Assessment: post-procedure vital signs reviewed and stable Respiratory status: spontaneous breathing, nonlabored ventilation, respiratory function stable and patient connected to nasal cannula oxygen Cardiovascular status: stable and blood pressure returned to baseline Postop Assessment: no apparent nausea or vomiting Anesthetic complications: no   No notable events documented.  Last Vitals:  Vitals:   03/26/22 1515 03/26/22 1528  BP: 137/65 129/72  Pulse: 67 64  Resp: 19 16  Temp: (!) 36.1 C   SpO2: 100% 95%    Last Pain:  Vitals:   03/26/22 1528  TempSrc:   PainSc: 0-No pain                 Effie Berkshire

## 2022-03-26 NOTE — Op Note (Signed)
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 Estimated Blood Loss:     Estimated blood loss: none. Procedure:                Pre-Anesthesia Assessment:                           - Prior to the procedure, a History and Physical                            was performed, and patient medications and                            allergies were reviewed. The patient's tolerance of                            previous anesthesia was also reviewed. The risks                            and benefits of the procedure and the sedation                            options and risks were discussed with the patient.                            All questions were answered, and informed consent                            was obtained. Prior Anticoagulants: The patient has                            taken no previous anticoagulant or antiplatelet                            agents. ASA Grade Assessment: III - A patient with                            severe systemic disease. After reviewing the risks  and benefits, the patient was deemed in                            satisfactory condition to undergo the procedure.                           - Sedation was administered by an anesthesia                            professional. Deep sedation was attained.                           After obtaining informed consent, the endoscope was                            passed under direct vision.  Throughout the                            procedure, the patient's blood pressure, pulse, and                            oxygen saturations were monitored continuously. The                            [id number] ([id number]) Olympus linear ultrasound scope                            was introduced through the mouth, and advanced to                            the second part of duodenum. The upper EUS was                            accomplished without difficulty. The patient                            tolerated the procedure well. Scope In: Scope Out: Findings:      ENDOSONOGRAPHIC FINDING: :      An irregular mass was identified in the pancreatic head and uncinate       process of the pancreas. The mass was hypoechoic. The mass measured 25       mm by 25 mm in maximal cross-sectional diameter. The endosonographic       borders were poorly-defined. The remainder of the pancreas was examined.       The endosonographic appearance of parenchyma and the upstream pancreatic       duct indicated a maximum duct diameter of 5 mm and parenchymal atrophy.       Fine needle aspiration for cytology was performed. Color Doppler imaging       was utilized prior to needle puncture to confirm a lack of significant       vascular structures within the needle path. Five passes were made with       the 25 gauge needle using a transduodenal approach. A stylet was used. A       cytologist was present and performed a  preliminary cytologic       examination. The cellularity of the specimen was adequate. Final       cytology results are pending.      There was dilation in the common bile duct which measured up to 5-7 mm.      An irregular hypoechoic mass was noted in the head/uncinate process       region of the pancreas. Five passes with the 25 gauge FNA needle were       performed with adequate sampling. The distal CBD was measured to be 5       mm, but proximally it was at 7 mm. There was no immediate obstruction  of       the CBD at this time. Impression:               - A mass was identified in the pancreatic head and                            uncinate process of the pancreas. Tissue was                            obtained from this exam, and results are pending.                            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 Fine needle                            aspiration performed.                           - There was dilation in the common bile duct which                            measured up to 5-7 mm. Moderate Sedation:      Not Applicable - Patient had care per Anesthesia. Recommendation:           - Patient has a contact number available for                            emergencies. The signs and symptoms of potential                            delayed complications were discussed with the                            patient. Return to normal activities tomorrow.                            Written discharge instructions were provided to the                            patient.                           -  Resume regular diet.                           - Await cytology results. Procedure Code(s):        --- Professional ---                           [phone], Esophagogastroduodenoscopy, flexible,                            transoral; with transendoscopic ultrasound-guided                            intramural or transmural fine needle                            aspiration/biopsy(s), (includes endoscopic                            ultrasound examination limited to the esophagus,                            stomach or duodenum, and adjacent structures) Diagnosis Code(s):        --- Professional ---                           K86.89, Other specified diseases of pancreas                           K83.8, Other specified diseases of biliary tract CPT copyright 2019 [employer]. All rights reserved. The codes  documented in this report are preliminary and upon coder review may  be revised to meet current compliance requirements. Carol Ada, MD Carol Ada, MD 03/26/2022 3:16:33 PM This report has been signed electronically. Number of Addenda: 0

## 2022-03-26 NOTE — H&P (Signed)
Sarah Davies HPI: Recent imaging during the hospitalization earlier this week showed a pancreatic uncinate process mass.  She is here today for an EUS with FNA.  Past Medical History:  Diagnosis Date   Allergy    Cataract    Diabetes mellitus without complication ([place])    Hypertension     Past Surgical History:  Procedure Laterality Date   ABDOMINAL HYSTERECTOMY     CHOLECYSTECTOMY     SPINE SURGERY      History reviewed. No pertinent family history.  Social History:  reports that she has never smoked. She has never used smokeless tobacco. She reports that she does not drink alcohol and does not use drugs.  Allergies:  Allergies  Allergen Reactions   Nicotine Other (See Comments)    Throat closes    Other Other (See Comments)    Joint pain Other reaction(s): Other (See Comments) Animal Dander From when she was tested, it was stimulated.      Shellfish Allergy Other (See Comments)    Has never had a reaction, was just told through allergy testing that she is allergic to shellfish "Throat closes"   Molds & Smuts Other (See Comments)    From when she was tested, it was stimulated.   Other reaction(s): Other (See Comments) From when she was tested, it was stimulated.     Statins Other (See Comments)    Muscle aches    Medications: Scheduled: Continuous:  Results for orders placed or performed during the hospital encounter of 03/26/22 (from the past 24 hour(s))  Glucose, capillary     Status: Abnormal   Collection Time: 03/26/22 10:43 AM  Result Value Ref Range   Glucose-Capillary 294 (H) 70 - 99 mg/dL  Glucose, capillary     Status: Abnormal   Collection Time: 03/26/22 12:39 PM  Result Value Ref Range   Glucose-Capillary 263 (H) 70 - 99 mg/dL  Glucose, capillary     Status: Abnormal   Collection Time: 03/26/22  3:32 PM  Result Value Ref Range   Glucose-Capillary 204 (H) 70 - 99 mg/dL     No results found.  ROS:  As stated above in the HPI otherwise  negative.  Blood pressure (!) 158/71, pulse 64, temperature (!) 96.9 F (36.1 C), temperature source Temporal, resp. rate 15, height '5\' 6"'$  (1.676 m), weight 110.7 kg, SpO2 100 %.    PE: Gen: NAD, Alert and Oriented HEENT:  [place]/AT, EOMI Neck: Supple, no LAD Lungs: CTA Bilaterally CV: RRR without M/G/R ABD: Soft, NTND, +BS Ext: No C/C/E  Assessment/Plan: 1) Pancreatic head mass - EUS with FNA.  Sarah Davies D 03/26/2022, 5:00 PM

## 2022-03-26 NOTE — Discharge Instructions (Signed)

## 2022-03-30 ENCOUNTER — Encounter (HOSPITAL_COMMUNITY): Payer: Self-pay | Admitting: Gastroenterology

## 2022-03-30 DIAGNOSIS — C250 Malignant neoplasm of head of pancreas: Secondary | ICD-10-CM

## 2022-03-30 LAB — CYTOLOGY - NON PAP

## 2022-03-30 NOTE — Progress Notes (Signed)
I received an in basket message from Dr Benson Norway for oncology referral.  Order placed.

## 2022-03-31 ENCOUNTER — Encounter: Payer: Self-pay | Admitting: *Deleted

## 2022-03-31 NOTE — Progress Notes (Signed)
PATIENT NAVIGATOR PROGRESS NOTE  Name: Sarah Davies Date: 03/31/2022 MRN: [id number]  DOB: [birth year]   Reason for visit:  Introductory phone call  Comments:  Called and spoke with Sarah Davies and discussed new patient appt for 04/06/22 at 11:15 with Ned Card NP and Dr Benay Spice. Directions to building and parking reviewed and one support person allowed in appt  Contact information provided and encouraged her to call with any issues or questions    Time spent counseling/coordinating care: 30-45 minutes

## 2022-04-05 ENCOUNTER — Telehealth: Payer: Self-pay

## 2022-04-05 NOTE — Telephone Encounter (Signed)
Patient called in and had a question about how many people can come with her during the visit and can she have her daughter on the phone doing the visit? Per Misty Stanley she can have her daughter on the phone and she bring her husband. I advice the patient that is fine with the provider.

## 2022-04-06 ENCOUNTER — Encounter: Payer: Self-pay | Admitting: *Deleted

## 2022-04-06 ENCOUNTER — Other Ambulatory Visit: Payer: Self-pay

## 2022-04-06 ENCOUNTER — Other Ambulatory Visit: Payer: Self-pay | Admitting: *Deleted

## 2022-04-06 ENCOUNTER — Inpatient Hospital Stay: Payer: Medicare PPO | Attending: Nurse Practitioner | Admitting: Nurse Practitioner

## 2022-04-06 ENCOUNTER — Ambulatory Visit (HOSPITAL_BASED_OUTPATIENT_CLINIC_OR_DEPARTMENT_OTHER)
Admission: RE | Admit: 2022-04-06 | Discharge: 2022-04-06 | Disposition: A | Discharge status: Home / Self Care | Payer: Medicare PPO | Source: Ambulatory Visit | Attending: Nurse Practitioner | Admitting: Nurse Practitioner

## 2022-04-06 ENCOUNTER — Encounter: Payer: Self-pay | Admitting: Nurse Practitioner

## 2022-04-06 ENCOUNTER — Inpatient Hospital Stay: Payer: Medicare PPO

## 2022-04-06 VITALS — BP 184/90 | HR 69 | Temp 98.2°F | Resp 18 | Ht 66.0 in | Wt 242.8 lb

## 2022-04-06 DIAGNOSIS — C250 Malignant neoplasm of head of pancreas: Secondary | ICD-10-CM | POA: Insufficient documentation

## 2022-04-06 DIAGNOSIS — R1010 Upper abdominal pain, unspecified: Secondary | ICD-10-CM | POA: Diagnosis not present

## 2022-04-06 DIAGNOSIS — E1165 Type 2 diabetes mellitus with hyperglycemia: Secondary | ICD-10-CM | POA: Insufficient documentation

## 2022-04-06 DIAGNOSIS — R634 Abnormal weight loss: Secondary | ICD-10-CM | POA: Diagnosis not present

## 2022-04-06 DIAGNOSIS — E785 Hyperlipidemia, unspecified: Secondary | ICD-10-CM | POA: Insufficient documentation

## 2022-04-06 DIAGNOSIS — R600 Localized edema: Secondary | ICD-10-CM | POA: Diagnosis not present

## 2022-04-06 DIAGNOSIS — R197 Diarrhea, unspecified: Secondary | ICD-10-CM | POA: Diagnosis not present

## 2022-04-06 DIAGNOSIS — I152 Hypertension secondary to endocrine disorders: Secondary | ICD-10-CM

## 2022-04-06 DIAGNOSIS — E1140 Type 2 diabetes mellitus with diabetic neuropathy, unspecified: Secondary | ICD-10-CM

## 2022-04-06 DIAGNOSIS — I10 Essential (primary) hypertension: Secondary | ICD-10-CM | POA: Insufficient documentation

## 2022-04-06 DIAGNOSIS — E1136 Type 2 diabetes mellitus with diabetic cataract: Secondary | ICD-10-CM | POA: Diagnosis not present

## 2022-04-06 LAB — CBC WITH DIFFERENTIAL (CANCER CENTER ONLY)
Abs Immature Granulocytes: 0.01 10*3/uL (ref 0.00–0.07)
Basophils Absolute: 0 10*3/uL (ref 0.0–0.1)
Basophils Relative: 0 %
Eosinophils Absolute: 0.2 10*3/uL (ref 0.0–0.5)
Eosinophils Relative: 3 %
HCT: 37.7 % (ref 36.0–46.0)
Hemoglobin: 12.1 g/dL (ref 12.0–15.0)
Immature Granulocytes: 0 %
Lymphocytes Relative: 24 %
Lymphs Abs: 1.6 10*3/uL (ref 0.7–4.0)
MCH: 27.4 pg (ref 26.0–34.0)
MCHC: 32.1 g/dL (ref 30.0–36.0)
MCV: 85.5 fL (ref 80.0–100.0)
Monocytes Absolute: 0.6 10*3/uL (ref 0.1–1.0)
Monocytes Relative: 9 %
Neutro Abs: 4 10*3/uL (ref 1.7–7.7)
Neutrophils Relative %: 64 %
Platelet Count: 170 10*3/uL (ref 150–400)
RBC: 4.41 MIL/uL (ref 3.87–5.11)
RDW: 12.8 % (ref 11.5–15.5)
WBC Count: 6.3 10*3/uL (ref 4.0–10.5)
nRBC: 0 % (ref 0.0–0.2)

## 2022-04-06 LAB — CMP (CANCER CENTER ONLY)
ALT: 21 U/L (ref 0–44)
AST: 27 U/L (ref 15–41)
Albumin: 3.6 g/dL (ref 3.5–5.0)
Alkaline Phosphatase: 76 U/L (ref 38–126)
Anion gap: 10 (ref 5–15)
BUN: 9 mg/dL (ref 8–23)
CO2: 30 mmol/L (ref 22–32)
Calcium: 9.9 mg/dL (ref 8.9–10.3)
Chloride: 98 mmol/L (ref 98–111)
Creatinine: 0.79 mg/dL (ref 0.44–1.00)
GFR, Estimated: >60 mL/min (ref >60)
Glucose, Bld: 329 mg/dL — ABNORMAL HIGH (ref 70–99)
Potassium: 3.8 mmol/L (ref 3.5–5.1)
Sodium: 138 mmol/L (ref 135–145)
Total Bilirubin: 0.5 mg/dL (ref 0.3–1.2)
Total Protein: 7.6 g/dL (ref 6.5–8.1)

## 2022-04-06 NOTE — Progress Notes (Signed)
Formatting of this note is different from the original.  New Hematology/Oncology Consult    Requesting MD: Dr. Jeani Hawking    [id number]      Reason for Consult: Pancreas cancer    HPI: Lynn Blackwell is a 81 year old woman referred for evaluation of newly diagnosed pancreas cancer.  She presented to the emergency department on 03/22/2022 for evaluation of elevated blood sugar.      She reports she has had diabetes for years and blood sugar has been under good control.  Recently blood sugars increased into the 500 range.  This prompted the visit to the emergency department.  She also reports weight loss of 35 pounds over the past 6 to 7 weeks, diarrhea for the past 3 weeks and intermittent upper abdominal pain for 2 weeks.     CT abdomen/pelvis showed an ill-defined 3.6 x 2.5 cm hypodense mass in the uncinate process of the pancreas; possible associated acute pancreatitis; fatty liver; faint hypodense focus in the inferior right lobe of the liver too small to characterize; no intrahepatic biliary dilatation.      MRI 03/23/2022 showed a hypoenhancing mass in the uncinate process/inferior head of the pancreas abutting the SMV with distortion of the SMV, less than 180 degree involvement by MR but with limited assessment; pancreatic duct narrowing/obstruction and early involvement of the distal common bile duct; subtle hepatic lesion in the right hepatic lobe; hazy mesentery may reflect concomitant mesenteritis or lymphatic congestion; small lymph nodes adjacent to the masslike area in the head of the pancreas.    Upper EUS on 03/26/2022 showed a mass identified in the pancreatic head and uncinate process, appearance consistent with adenocarcinoma.  FNA pancreas head showed malignant cells consistent with adenocarcinoma.    Past Medical History:   Diagnosis Date    Allergy     Cataract     Diabetes mellitus without complication (HCC)     Hypertension    :    Past Surgical History:   Procedure Laterality Date    ABDOMINAL  HYSTERECTOMY      CHOLECYSTECTOMY      ESOPHAGOGASTRODUODENOSCOPY (EGD) WITH PROPOFOL N/A 03/26/2022    Procedure: ESOPHAGOGASTRODUODENOSCOPY (EGD) WITH PROPOFOL;  Surgeon: Jeani Hawking, MD;  Location: [facility];  Service: Gastroenterology;  Laterality: N/A;    EUS N/A 03/26/2022    Procedure: UPPER ENDOSCOPIC ULTRASOUND (EUS) LINEAR;  Surgeon: Jeani Hawking, MD;  Location: [facility];  Service: Gastroenterology;  Laterality: N/A;    FINE NEEDLE ASPIRATION N/A 03/26/2022    Procedure: FINE NEEDLE ASPIRATION (FNA) LINEAR;  Surgeon: Jeani Hawking, MD;  Location: [facility];  Service: Gastroenterology;  Laterality: N/A;    SPINE SURGERY     :    Current Outpatient Medications:     b complex vitamins tablet, Take 1 tablet by mouth daily., Disp: , Rfl:     blood glucose meter kit and supplies, Dispense based on patient and insurance preference. Use up to four times daily as directed. (FOR ICD-10 E10.9, E11.9)., Disp: 1 each, Rfl: 0    calcium carbonate (OS-CAL) 600 MG TABS, Take 600 mg by mouth 2 (two) times daily with a meal., Disp: , Rfl:     carvedilol (COREG) 25 MG tablet, Take 50 mg by mouth 2 (two) times daily with a meal., Disp: , Rfl:     cholecalciferol (VITAMIN D) 1000 UNITS tablet, Take 1,000 Units by mouth daily., Disp: , Rfl:     doxazosin (CARDURA) 4 MG tablet, Take 4 mg by  mouth at bedtime. , Disp: , Rfl:     furosemide (LASIX) 20 MG tablet, Take 20 mg by mouth daily. , Disp: , Rfl:     glipiZIDE (GLUCOTROL XL) 5 MG 24 hr tablet, Take 7.5 mg by mouth daily., Disp: , Rfl:     insulin glargine (LANTUS) 100 UNIT/ML Solostar Pen, Inject 15 Units into the skin daily., Disp: 15 mL, Rfl: 11    Insulin Pen Needle (PEN NEEDLES 3/16) 31G X 5 MM MISC, 1 Application by Does not apply route daily., Disp: 30 each, Rfl: 1    lipase/protease/amylase (CREON) 36000 UNITS CPEP capsule, Take 1 capsule (36,000 Units total) by mouth 3 (three) times daily before meals., Disp: 180 capsule, Rfl: 0    lisinopril (ZESTRIL) 20  MG tablet, Take 20 mg by mouth daily., Disp: , Rfl:     magnesium 30 MG tablet, Take 30 mg by mouth daily., Disp: , Rfl:     ondansetron (ZOFRAN) 4 MG tablet, Take 1 tablet (4 mg total) by mouth every 6 (six) hours as needed for nausea., Disp: 20 tablet, Rfl: 0    potassium chloride SA (KLOR-CON M) 20 MEQ tablet, Take 2 tablets (40 mEq total) by mouth daily for 3 days., Disp: 6 tablet, Rfl: 0:    :    Allergies   Allergen Reactions    Nicotine Other (See Comments)     Throat closes     Other Other (See Comments)     Joint pain  Other reaction(s): Other (See Comments)  Animal Dander  From when she was tested, it was stimulated.       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    Other reaction(s): Other (See Comments)  From when she was tested, it was stimulated.      Statins Other (See Comments)     Muscle aches     FH: Sister deceased with breast cancer; sister with throat cancer, deceased due to blood clots; maternal grandmother with cancer, type unknown.    SOCIAL HISTORY: She lives in [place] with her husband.  She has 2 children.  No tobacco use.  She drinks an occasional glass of wine.    Review of Systems: No fevers or sweats.  Appetite is poor.  She estimates 35 pounds of weight loss over the past 6 to 7 weeks.  Diarrhea for the past 3 weeks.  The diarrhea tends to occur after eating.  No nausea or vomiting.  Intermittent upper abdominal pain for 2 weeks.  Discomfort at the left lateral chest which she describes as a squeeze.  No rash.  She has bilateral lower extremity edema.  No urinary symptoms.  Numbness/tingling in the feet related to diabetes.    Physical Exam:    Blood pressure (!) 184/90, pulse 69, temperature 98.2 F (36.8 C), temperature source Oral, resp. rate 18, height 5' 6 (1.676 m),  weight 242 lb 12.8 oz (110.1 kg), SpO2 98 %.    HEENT: No thrush or ulcers.  Lungs: Lungs clear bilaterally.  Cardiac: Regular rate and rhythm.  Abdomen: Abdomen soft.  Tender mid upper abdomen.  No mass.  No hepatomegaly.  Vascular: Trace edema lower leg/ankle bilaterally right slightly greater than left; tender right inner lower  leg.  Chronic stasis change bilaterally.  Lymph nodes: No palpable cervical, supraclavicular, axillary or inguinal lymph nodes.  Neurologic: Alert and oriented.    No results for input(s): WBC, HGB, HCT, PLT in the last 72 hours.    No results for input(s): NA, K, CL, CO2, GLUCOSE, BUN, CREATININE, CALCIUM in the last 72 hours.    RADIOLOGY:    MR ABDOMEN MRCP W WO CONTAST    Result Date: 03/23/2022  CLINICAL DATA:  Suspected pancreatic mass in a patient with history of potential pancreatitis. EXAM: MRI ABDOMEN WITHOUT AND WITH CONTRAST (INCLUDING MRCP) TECHNIQUE: Multiplanar multisequence MR imaging of the abdomen was performed both before and after the administration of intravenous contrast. Heavily T2-weighted images of the biliary and pancreatic ducts were obtained, and three-dimensional MRCP images were rendered by post processing. CONTRAST:  10mL GADAVIST GADOBUTROL 1 MMOL/ML IV SOLN COMPARISON:  CT evaluation of June 12 of 2023. FINDINGS: Lower chest: Incidental imaging of the lung bases is unremarkable. Limited assessment on MRI. Hepatobiliary: Subtle hepatic lesion in the RIGHT hepatic lobe, hepatic subsegment V/VI (image 23/5) also visible on diffusion-weighted imaging. Area not well assessed on post-contrast imaging due to motion related artifact. Common bile duct is less than a cm greatest caliber in the setting of prior cholecystectomy is mildly irregular. Some narrowing distally but less narrowing that is seen of the main pancreatic duct which shows a transition in the pancreatic head above the suspected mass in the pancreas, see below. Intrahepatic  biliary ducts are moderately dilated again with changes of cholecystectomy. Pancreas: Pancreatic mass centered in the uncinate abutting the SMV with distortion of the SMV, less than 180 degree involvement by MR but with limited assessment with respect to this portion of the evaluation due to motion and susceptibility effects from adjacent bowel. Mass displays hypovascular features and measures 3.3 x 2.7 cm mainly in the uncinate process. Pancreatic duct is narrowed passing above this mass towards the ampulla and is dilated peripherally with cystic areas that are unchanged in the very short interval compared to the prior CT. Small adjacent lymph nodes are present in the small bowel mesentery and there is diffuse mesenteric edema and thickening which is nonspecific Spleen:  Normal. Adrenals/Urinary Tract:  Adrenal glands are normal. Symmetric renal enhancement without hydronephrosis. Stomach/Bowel: No acute gastrointestinal process to the extent evaluated on abdominal MRI. Vascular/Lymphatic: Scattered lymph nodes without pathologic enlargement but with some immediately adjacent to the masslike area in the head of the pancreas, within the small bowel mesentery. Other:  No ascites Musculoskeletal: No suspicious bone lesions identified. IMPRESSION: Motion limited assessment with hypoenhancing mass in the uncinate process/inferior head of the pancreas with pancreatic ductal narrowing/obstruction and early involvement of the distal common bile duct. 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 Endoscopic assessment may be helpful to establish diagnosis. Subtle hepatic lesion in the RIGHT hepatic lobe, enhancement characteristics cannot be assessed but this does raise the question of hepatic involvement. Vascular involvement is difficult to assess on MR and on the previous CT, subsequent staging with dedicated pancreatic protocol may be helpful. There is at least abutment of the SMV. Hazy mesentery may  reflect concomitant mesenteritis or lymphatic congestion. Small lymph nodes adjacent to the masslike area in the head of the pancreas warrant close attention on follow-up and or consideration for evaluation with PET depending on further workup. Electronically Signed   By: Donzetta Kohut M.D.   On: 03/23/2022 11:49     MR 3D Recon At Scanner  Result Date: 03/23/2022  CLINICAL DATA:  Suspected pancreatic mass in a patient with history of potential pancreatitis. EXAM: MRI ABDOMEN WITHOUT AND WITH CONTRAST (INCLUDING MRCP) TECHNIQUE: Multiplanar multisequence MR imaging of the abdomen was performed both before and after the administration of intravenous contrast. Heavily T2-weighted images of the biliary and pancreatic ducts were obtained, and three-dimensional MRCP images were rendered by post processing. CONTRAST:  10mL GADAVIST GADOBUTROL 1 MMOL/ML IV SOLN COMPARISON:  CT evaluation of June 12 of 2023. FINDINGS: Lower chest: Incidental imaging of the lung bases is unremarkable. Limited assessment on MRI. Hepatobiliary: Subtle hepatic lesion in the RIGHT hepatic lobe, hepatic subsegment V/VI (image 23/5) also visible on diffusion-weighted imaging. Area not well assessed on post-contrast imaging due to motion related artifact. Common bile duct is less than a cm greatest caliber in the setting of prior cholecystectomy is mildly irregular. Some narrowing distally but less narrowing that is seen of the main pancreatic duct which shows a transition in the pancreatic head above the suspected mass in the pancreas, see below. Intrahepatic biliary ducts are moderately dilated again with changes of cholecystectomy. Pancreas: Pancreatic mass centered in the uncinate abutting the SMV with distortion of the SMV, less than 180 degree involvement by MR but with limited assessment with respect to this portion of the evaluation due to motion and susceptibility effects from adjacent bowel. Mass displays hypovascular features and  measures 3.3 x 2.7 cm mainly in the uncinate process. Pancreatic duct is narrowed passing above this mass towards the ampulla and is dilated peripherally with cystic areas that are unchanged in the very short interval compared to the prior CT. Small adjacent lymph nodes are present in the small bowel mesentery and there is diffuse mesenteric edema and thickening which is nonspecific Spleen:  Normal. Adrenals/Urinary Tract:  Adrenal glands are normal. Symmetric renal enhancement without hydronephrosis. Stomach/Bowel: No acute gastrointestinal process to the extent evaluated on abdominal MRI. Vascular/Lymphatic: Scattered lymph nodes without pathologic enlargement but with some immediately adjacent to the masslike area in the head of the pancreas, within the small bowel mesentery. Other:  No ascites Musculoskeletal: No suspicious bone lesions identified. IMPRESSION: Motion limited assessment with hypoenhancing mass in the uncinate process/inferior head of the pancreas with pancreatic ductal narrowing/obstruction and early involvement of the distal common bile duct. 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 Endoscopic assessment may be helpful to establish diagnosis. Subtle hepatic lesion in the RIGHT hepatic lobe, enhancement characteristics cannot be assessed but this does raise the question of hepatic involvement. Vascular involvement is difficult to assess on MR and on the previous CT, subsequent staging with dedicated pancreatic protocol may be helpful. There is at least abutment of the SMV. Hazy mesentery may reflect concomitant mesenteritis or lymphatic congestion. Small lymph nodes adjacent to the masslike area in the head of the pancreas warrant close attention on follow-up and or consideration for evaluation with PET depending on further workup. Electronically Signed   By: Donzetta Kohut M.D.   On: 03/23/2022 11:49     CT Abdomen Pelvis W Contrast    Result Date: 03/22/2022  CLINICAL DATA:   Acute abdominal pain.  An intentional weight loss. EXAM: CT ABDOMEN AND PELVIS WITH CONTRAST TECHNIQUE: Multidetector CT imaging of the abdomen and pelvis was performed using the standard protocol following bolus administration of intravenous contrast. RADIATION DOSE REDUCTION: This exam was performed according to the departmental dose-optimization program which includes automated exposure control, adjustment of the mA and/or kV according to patient size and/or  use of iterative reconstruction technique. CONTRAST:  OMNIPAQUE IOHEXOL 300 MG/ML  SOLN COMPARISON:  None Available. FINDINGS: Lower chest: The visualized lung bases are clear. No intra-abdominal free air.  Small free fluid in the pelvis. Hepatobiliary: Fatty liver. Faint hypodense focus in the inferior right lobe of the liver (27/2 and 72/4) is too small to characterize. No intrahepatic biliary dilatation. Cholecystectomy. No retained calcified stone noted in the central CBD. Pancreas: There is an ill-defined 3.6 x 2.5 cm hypodense mass in the uncinate process of the pancreas. There is mild atrophy of the pancreas with mild dilatation of the main pancreatic duct. There is inflammatory changes and stranding of the mesentery inferior to the pancreas. 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 There may be superimposed pancreatitis. Correlation with pancreatic enzymes recommended. Further evaluation of the pancreatic lesion with pancreatic mass protocol MRI on a nonemergent/outpatient basis recommended. 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 No drainable fluid collection/abscess or pseudocyst. Spleen: Normal in size without focal abnormality. Adrenals/Urinary Tract: The adrenal glands are unremarkable. There is no hydronephrosis on either side. There is symmetric enhancement and excretion  of contrast by both kidneys. The visualized ureters and urinary bladder appear unremarkable. Stomach/Bowel: There is no bowel obstruction or active inflammation. The appendix is normal. Vascular/Lymphatic: Mild aortoiliac atherosclerotic disease. The IVC is unremarkable. No portal venous gas. Small scattered mesenteric lymph nodes. No adenopathy. Reproductive: Hysterectomy.  No adnexal masses. Other: Ventral hernia repair mesh. Small fat containing ventral hernia noted. There is abutment of several loops of small bowel to the anterior hernia repair mesh consistent with adhesions. Musculoskeletal: Degenerative changes of the spine. No acute osseous pathology. IMPRESSION: 1. Findings most concerning for pancreatic malignancy arising in the uncinate process of the pancreas with possible associated acute pancreatitis. Correlation with pancreatic enzymes and further evaluation of the pancreatic mass with MRI on a nonemergent/outpatient basis recommended. 2. Fatty liver. 3. No bowel obstruction. Normal appendix. 4. Aortic Atherosclerosis (ICD10-I70.0). Electronically Signed   By: Elgie Collard M.D.   On: 03/22/2022 21:54      Assessment and Plan:     Pancreas cancer  03/22/2022 CT abdomen/pelvis-ill-defined 3.6 x 2.5 cm hypodense mass in the uncinate process of the pancreas; possible associated acute pancreatitis; fatty liver; faint hypodense focus in the inferior right lobe of the liver too small to characterize; no intrahepatic biliary dilatation.    03/23/2022 MRI-hypoenhancing mass in the uncinate process/inferior head of the pancreas abutting the SMV with distortion of the SMV, less than 180 degree involvement by MR but with limited assessment; pancreatic duct narrowing/obstruction and early involvement of the distal common bile duct; subtle hepatic lesion in the right hepatic lobe; hazy mesentery may reflect concomitant mesenteritis or lymphatic congestion; small lymph nodes adjacent to the masslike area in the  head of the pancreas.  03/26/2022 upper EUS-mass identified in the pancreatic head and uncinate process, appearance consistent with adenocarcinoma.  FNA pancreas head showed malignant cells consistent with adenocarcinoma.  Diabetes with recent poor control  Diarrhea, typically after eating  Diabetic neuropathy  Hypertension  Hyperlipidemia    Ms. Armistead has been diagnosed with pancreas cancer, possible liver lesion.  Vascular involvement is unclear.  We are referring her for pancreas protocol CT.  Obtain baseline labs today to include CBC, chemistry panel, CA 19-9.  Referral made for Port-A-Cath placement in anticipation of chemotherapy.  Referral placed to genetics.    She has an appointment with her PCP tomorrow regarding poorly controlled diabetes.    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  We are sending a prescription to her pharmacy for Creon.    She has asymmetric leg edema.  Plan for venous Doppler studies today.    She will return for follow-up in approximately 10 days for further discussion regarding the treatment plan.    Patient seen with Dr. Truett Perna.    Lonna Cobb, NP  04/06/2022, 12:36 PM     This was a shared visit with Lonna Cobb.  Ms. Danek was interviewed and examined.  We reviewed the EUS report and CT images.  She has been diagnosed with pancreas cancer.  She appears to have locally advanced disease.  We discussed treatment of pancreas cancer with Ms. Gudino and her husband.    She does not appear to be a candidate for surgical resection.  We discussed comfort care versus a trial of systemic therapy.  Would like to proceed with treatment of the cancer.    She will begin pancreatic enzyme replacement.  She will return for an office visit and further discussion next week.  Her case will be presented at the GI tumor conference.    I was present for greater than 50% today's visit.  I performed medical decision making.    Mancel Bale, MD  Electronically signed by Ladene Artist, MD  at 04/06/2022  8:40 PM EDT

## 2022-04-06 NOTE — Progress Notes (Signed)
PATIENT NAVIGATOR PROGRESS NOTE  Name: MENDI CONSTABLE Date: 04/06/2022 MRN: [id number]  DOB: 12-01-1940   Reason for visit:  Initial med/onc visit with Ned Card NP and Dr Benay Spice  Comments:  Met with Mr and Mrs Hauck during initial med onc visit Schedule for CT Pancreas protocol Schedule for doppler of both legs to rule out blood clot Referral to genetics and [place] labs drawn today Referral to IR for [facility] placement Referral to nutrition with new Pancreatic cancer diagnosis Referral to Social Work Lab work today including CA 19.9  Pt has appt with PCP tomorrow and will contact in regard to better BG control Reviewed contact information with patient and husband and encouraged them to contact us with any questions or issues     Time spent counseling/coordinating care: > 60 minutes

## 2022-04-07 ENCOUNTER — Inpatient Hospital Stay: Payer: Medicare PPO

## 2022-04-07 ENCOUNTER — Telehealth: Payer: Self-pay | Admitting: Genetic Counselor

## 2022-04-07 ENCOUNTER — Encounter (HOSPITAL_BASED_OUTPATIENT_CLINIC_OR_DEPARTMENT_OTHER): Payer: Self-pay

## 2022-04-07 ENCOUNTER — Encounter: Payer: Self-pay | Admitting: *Deleted

## 2022-04-07 ENCOUNTER — Ambulatory Visit (HOSPITAL_BASED_OUTPATIENT_CLINIC_OR_DEPARTMENT_OTHER)
Admission: RE | Admit: 2022-04-07 | Discharge: 2022-04-07 | Disposition: A | Discharge status: Home / Self Care | Payer: Medicare PPO | Source: Ambulatory Visit | Attending: Nurse Practitioner | Admitting: Nurse Practitioner

## 2022-04-07 DIAGNOSIS — C250 Malignant neoplasm of head of pancreas: Secondary | ICD-10-CM | POA: Insufficient documentation

## 2022-04-07 LAB — CANCER ANTIGEN 19-9: CA 19-9: 988 U/mL — ABNORMAL HIGH (ref 0–35)

## 2022-04-07 MED ORDER — IOHEXOL 300 MG/ML  SOLN
100.0000 mL | Freq: Once | INTRAMUSCULAR | Status: AC | PRN
  Administered 2022-04-07: 100 mL via INTRAVENOUS

## 2022-04-07 NOTE — Progress Notes (Signed)
[place] [facility]  Initial Assessment   Sarah Davies is a 81 y.o. year old female contacted by phone. Clinical Social Work was referred by nurse navigator for assessment of psychosocial needs.   SDOH (Social Determinants of Health) assessments performed: Yes SDOH Interventions    Flowsheet Row Most Recent Value  SDOH Interventions   Food Insecurity Interventions Intervention Not Indicated  Financial Strain Interventions Intervention Not Indicated  Housing Interventions Intervention Not Indicated  Stress Interventions Provide Counseling  Transportation Interventions Other (Comment)       SDOH Screenings   Alcohol Screen: Not on file  Depression (WUJ8-1): Not on file  Financial Resource Strain: Low Risk  (04/07/2022)   Overall Financial Resource Strain (CARDIA)    Difficulty of Paying Living Expenses: Not hard at all  Food Insecurity: No Food Insecurity (04/07/2022)   Hunger Vital Sign    Worried About Running Out of Food in the Last Year: Never true    Ran Out of Food in the Last Year: Never true  Housing: Low Risk  (04/07/2022)   Housing    Last Housing Risk Score: 0  Physical Activity: Not on file  Social Connections: Not on file  Stress: No Stress Concern Present (04/07/2022)   [place]    Feeling of Stress : Only a little  Tobacco Use: Low Risk  (04/07/2022)   Patient History    Smoking Tobacco Use: Never    Smokeless Tobacco Use: Never    Passive Exposure: Not on file  Transportation Needs: No Transportation Needs (04/07/2022)   PRAPARE - Transportation    Lack of Transportation (Medical): No    Lack of Transportation (Non-Medical): No     Distress Screen completed: No    04/06/2022   12:07 PM  ONCBCN DISTRESS SCREENING  Screening Type Initial Screening  Distress experienced in past week (1-10) 0  Spiritual/Religous concerns type Relating to God  Information Concerns Type Lack of  info about diagnosis  Physical Problem type Pain;Tingling hands/feet;Swollen arms/legs;Loss of appetitie  Physician notified of physical symptoms No  Referral to clinical psychology No  Referral to clinical social work No  Referral to dietition No  Referral to financial advocate No  Referral to support programs No  Referral to palliative care No      Family/Social Information:  Housing Arrangement: patient lives with her husband.    Family members/support persons in your life? Friends and Church.  Her husband receives dialysis three times a week and has transportation.  He has become forgetful and patient has assumed financial responsibility for their household.  She has a son in [place] and a daughter in [place].  She does not foresee them being able to assist in her care giving. Transportation concerns: yes.  She was told she will not be able to drive during chemotherapy.  She has attempted to use a transportation service, but they have difficulty understanding her.  She has Spasmodic Dysphonia.  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 Services Currently in place:  None  Coping/ Adjustment to diagnosis: Patient understands treatment plan and what happens next? 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 Patient enjoys time with family/ friends Current  coping skills/ strengths: Ability for insight , Average or above average intelligence , Capable of independent living , Communication skills , Financial means , General fund of knowledge , Motivation for treatment/growth , and Supportive family/friends     SUMMARY: Current SDOH Barriers:   Transportation  Clinical Social Work Clinical Goal(s):  Patient will work with SW to address concerns related to transportation and caring for her husband.  Interventions: Discussed common feeling and emotions when being diagnosed with cancer, and the importance of support during treatment Informed patient of the support team roles and support services at [facility] Provided CSW contact information and encouraged patient to call with any questions or concerns   Follow Up Plan: CSW will follow-up with patient by phone  Patient verbalizes understanding of plan: Yes    Rodman Pickle Kalandra Masters, LCSW

## 2022-04-07 NOTE — Progress Notes (Signed)
PATIENT NAVIGATOR PROGRESS NOTE  Name: MARILEA GWYNNE Date: 04/07/2022 MRN: [id number]  DOB: Dec 19, 1940   Reason for visit:  Telephone F/U after first appt  Comments:  Called to F/U after first appt and answered questions  Gave her results of Doppler study and that it was negative for DVTs  Appreciated call and encouraged to call with any questions or issues    Time spent counseling/coordinating care: 30-45 minutes

## 2022-04-07 NOTE — Telephone Encounter (Signed)
Scheduled appt per 6/27 referral. Pt is aware of appt date and time. Pt is aware to arrive 15 mins prior to appt time and to bring and updated insurance card. Pt is aware of appt location.   

## 2022-04-07 NOTE — Progress Notes (Signed)
Patient will be unable to drive during chemotherapy.  She is seeking resources.

## 2022-04-08 ENCOUNTER — Other Ambulatory Visit: Payer: Self-pay

## 2022-04-08 ENCOUNTER — Encounter: Payer: Self-pay | Admitting: Genetic Counselor

## 2022-04-08 ENCOUNTER — Inpatient Hospital Stay: Payer: Medicare PPO

## 2022-04-08 ENCOUNTER — Telehealth: Payer: Self-pay

## 2022-04-08 ENCOUNTER — Inpatient Hospital Stay (HOSPITAL_BASED_OUTPATIENT_CLINIC_OR_DEPARTMENT_OTHER): Payer: Medicare PPO | Admitting: Genetic Counselor

## 2022-04-08 DIAGNOSIS — Z8507 Personal history of malignant neoplasm of pancreas: Secondary | ICD-10-CM

## 2022-04-08 DIAGNOSIS — Z803 Family history of malignant neoplasm of breast: Secondary | ICD-10-CM | POA: Diagnosis not present

## 2022-04-08 NOTE — Progress Notes (Signed)
REFERRING PROVIDER: Owens Shark, NP [address] [place],  [place] [address]  PRIMARY PROVIDER:  Margretta Sidle, MD  PRIMARY REASON FOR VISIT:  1. Personal history of pancreatic cancer   2. Family history of breast cancer    HISTORY OF PRESENT ILLNESS:   Sarah Davies, a 81 y.o. female, was seen for a [place] cancer genetics consultation at the request of Ned Card, NP due to a personal and family history of cancer.  Sarah Davies presents to clinic today to discuss the possibility of a hereditary predisposition to cancer, to discuss genetic testing, and to further clarify her future cancer risks, as well as potential cancer risks for family members.   In June 2023, at the age of 66, Sarah Davies was diagnosed with pancreatic cancer. She reports a history of endometrial cancer diagnosed at age 64.  CANCER HISTORY:  Oncology History   No history exists.    RISK FACTORS:  First live birth at age 61.  Ovaries intact: no.  Uterus intact: no.  Menopausal status: postmenopausal.  HRT use: 0 years.  Past Medical History:  Diagnosis Date   Allergy    Cataract    Diabetes mellitus without complication ([place])    Hypertension     Past Surgical History:  Procedure Laterality Date   ABDOMINAL HYSTERECTOMY     CHOLECYSTECTOMY     ESOPHAGOGASTRODUODENOSCOPY (EGD) WITH PROPOFOL N/A 03/26/2022   Procedure: ESOPHAGOGASTRODUODENOSCOPY (EGD) WITH PROPOFOL;  Surgeon: Carol Ada, MD;  Location: [facility];  Service: Gastroenterology;  Laterality: N/A;   EUS N/A 03/26/2022   Procedure: UPPER ENDOSCOPIC ULTRASOUND (EUS) LINEAR;  Surgeon: Carol Ada, MD;  Location: [facility];  Service: Gastroenterology;  Laterality: N/A;   FINE NEEDLE ASPIRATION N/A 03/26/2022   Procedure: FINE NEEDLE ASPIRATION (FNA) LINEAR;  Surgeon: Carol Ada, MD;  Location: [facility];  Service: Gastroenterology;  Laterality: N/A;   SPINE SURGERY      Social History   Socioeconomic History   Marital  status: Married    Spouse name: Not on file   Number of children: 2   Years of education: Not on file   Highest education level: Not on file  Occupational History   Not on file  Tobacco Use   Smoking status: Never   Smokeless tobacco: Never  Substance and Sexual Activity   Alcohol use: No   Drug use: No   Sexual activity: Never    Birth control/protection: Abstinence  Other Topics Concern   Not on file  Social History Narrative   Not on file   Social Determinants of Health   Financial Resource Strain: Low Risk  (04/07/2022)   Overall Financial Resource Strain (CARDIA)    Difficulty of Paying Living Expenses: Not hard at all  Food Insecurity: No Food Insecurity (04/07/2022)   Hunger Vital Sign    Worried About Running Out of Food in the Last Year: Never true    Ran Out of Food in the Last Year: Never true  Transportation Needs: No Transportation Needs (04/07/2022)   PRAPARE - Hydrologist (Medical): No    Lack of Transportation (Non-Medical): No  Physical Activity: Not on file  Stress: No Stress Concern Present (04/07/2022)   [place]    Feeling of Stress : Only a little  Social Connections: Not on file     FAMILY HISTORY:  We obtained a detailed, 4-generation family history.  Significant diagnoses are listed  below: Family History  Problem Relation Age of Onset   Breast cancer Sister 39 - 9       metastatic   Throat cancer Sister    Cancer Maternal Grandmother        unknown type    Sarah Davies sister was diagnosed with breast cancer in her 24s, she died due to metastatic breast cancer. A second sister was diagnosed with throat cancer, she is deceased. Her maternal grandmother was diagnosed with an unknown type of cancer at an unknown age, she is deceased. Sarah Davies is unaware of previous family history of genetic testing for hereditary cancer risks. There is no reported  Ashkenazi Jewish ancestry.  GENETIC COUNSELING ASSESSMENT: Sarah Davies is a 80 y.o. female with a personal and family history of cancer which is somewhat suggestive of a hereditary predisposition to cancer. We, therefore, discussed and recommended the following at today's visit.   DISCUSSION: We discussed that 5 - 10% of cancer is hereditary, with most cases of pancreatic cancer associated with BRCA1/2.  There are other genes that can be associated with hereditary pancreatic cancer syndromes.  We discussed that testing is beneficial for several reasons including knowing how to follow individuals after completing their treatment, identifying whether potential treatment options would be beneficial, and understanding if other family members could be at risk for cancer and allowing them to undergo genetic testing.   We reviewed the characteristics, features and inheritance patterns of hereditary cancer syndromes. We also discussed genetic testing, including the appropriate family members to test, the process of testing, insurance coverage and turn-around-time for results. We discussed the implications of a negative, positive, carrier and/or variant of uncertain significant result. We recommended Sarah Davies pursue genetic testing for a panel that includes genes associated with pancreatic and breast cancer.   Sarah Davies elected to have [facility] CancerNext-Expanded Panel. The CancerNext-Expanded gene panel offered by [facility] and includes sequencing, rearrangement, and RNA analysis for the following 77 genes: AIP, ALK, APC, ATM, AXIN2, BAP1, BARD1, BLM, BMPR1A, BRCA1, BRCA2, BRIP1, CDC73, CDH1, CDK4, CDKN1B, CDKN2A, CHEK2, CTNNA1, DICER1, FANCC, FH, FLCN, GALNT12, KIF1B, LZTR1, MAX, MEN1, MET, MLH1, MSH2, MSH3, MSH6, MUTYH, NBN, NF1, NF2, NTHL1, PALB2, PHOX2B, PMS2, POT1, PRKAR1A, PTCH1, PTEN, RAD51C, RAD51D, RB1, RECQL, RET, SDHA, SDHAF2, SDHB, SDHC, SDHD, SMAD4, SMARCA4, SMARCB1, SMARCE1, STK11, SUFU, TMEM127,  TP53, TSC1, TSC2, VHL and XRCC2 (sequencing and deletion/duplication); EGFR, EGLN1, HOXB13, KIT, MITF, PDGFRA, POLD1, and POLE (sequencing only); EPCAM and GREM1 (deletion/duplication only).   Based on Sarah Davies personal and family history of cancer, she meets medical criteria for genetic testing. Despite that she meets criteria, she may still have an out of pocket cost. We discussed that if her out of pocket cost for testing is over $100, the laboratory will call and confirm whether she wants to proceed with testing.  If the out of pocket cost of testing is less than $100 she will be billed by the genetic testing laboratory.   PLAN: After considering the risks, benefits, and limitations, Sarah Davies provided informed consent to pursue genetic testing and the blood sample was sent to [facility] for analysis of the CancerNext-Expanded Panel. Results should be available within approximately 2-3 weeks' time, at which point they will be disclosed by telephone to Sarah Davies, as will any additional recommendations warranted by these results. Sarah Davies will receive a summary of her genetic counseling visit and a copy of her results once available. This information will also be available in Epic.  Sarah Davies questions were answered to her satisfaction today. Our contact information was provided should additional questions or concerns arise. Thank you for the referral and allowing [place] to share in the care of your patient.   Lucille Passy, MS, [facility] Genetic Counselor [email or web address] (P) [phone]  The patient was seen for a total of 25 minutes in face-to-face genetic counseling. The patient was seen alone.  Drs. Lindi Adie and/or Burr Medico were available to discuss this case as needed.   _______________________________________________________________________ For Office Staff:  Number of people involved in session: 1 Was an Intern/ student involved with case: no

## 2022-04-08 NOTE — Telephone Encounter (Signed)
CSW phoned [employer] with patient's permission.  She utilizes them for scheduling her transportation.  Verified that they will transport to and from medical appointments.  She was having difficulty accessing their website recently and CSW was able assist in addressing the problem.  Encouraged patient to contact CSW with any further needs.  Creola Corn, Jaquez Farrington, LCSW

## 2022-04-09 ENCOUNTER — Telehealth: Payer: Self-pay | Admitting: *Deleted

## 2022-04-09 NOTE — Telephone Encounter (Signed)
Notified patient of CT abd/pelvis via pancreas protocol on 04/14/22 at 1745/1800. NPO 4 hours before. She will be giving water to drink when she arrives in radiology department. Managed care notified for PA.

## 2022-04-12 ENCOUNTER — Inpatient Hospital Stay: Payer: Medicare PPO

## 2022-04-12 LAB — GENETIC SCREENING ORDER

## 2022-04-14 ENCOUNTER — Inpatient Hospital Stay: Payer: Medicare PPO | Attending: Nurse Practitioner | Admitting: Nutrition

## 2022-04-14 ENCOUNTER — Telehealth: Payer: Self-pay | Admitting: *Deleted

## 2022-04-14 ENCOUNTER — Ambulatory Visit (HOSPITAL_BASED_OUTPATIENT_CLINIC_OR_DEPARTMENT_OTHER): Admission: RE | Admit: 2022-04-14 | Payer: Medicare PPO | Source: Ambulatory Visit

## 2022-04-14 ENCOUNTER — Telehealth: Payer: Self-pay | Admitting: Nutrition

## 2022-04-14 DIAGNOSIS — Z5111 Encounter for antineoplastic chemotherapy: Secondary | ICD-10-CM | POA: Insufficient documentation

## 2022-04-14 DIAGNOSIS — E1169 Type 2 diabetes mellitus with other specified complication: Secondary | ICD-10-CM | POA: Insufficient documentation

## 2022-04-14 DIAGNOSIS — B370 Candidal stomatitis: Secondary | ICD-10-CM | POA: Insufficient documentation

## 2022-04-14 DIAGNOSIS — E1140 Type 2 diabetes mellitus with diabetic neuropathy, unspecified: Secondary | ICD-10-CM | POA: Insufficient documentation

## 2022-04-14 DIAGNOSIS — R197 Diarrhea, unspecified: Secondary | ICD-10-CM | POA: Insufficient documentation

## 2022-04-14 DIAGNOSIS — E1165 Type 2 diabetes mellitus with hyperglycemia: Secondary | ICD-10-CM | POA: Insufficient documentation

## 2022-04-14 DIAGNOSIS — I10 Essential (primary) hypertension: Secondary | ICD-10-CM | POA: Insufficient documentation

## 2022-04-14 DIAGNOSIS — C250 Malignant neoplasm of head of pancreas: Secondary | ICD-10-CM | POA: Insufficient documentation

## 2022-04-14 DIAGNOSIS — R935 Abnormal findings on diagnostic imaging of other abdominal regions, including retroperitoneum: Secondary | ICD-10-CM | POA: Insufficient documentation

## 2022-04-14 DIAGNOSIS — E785 Hyperlipidemia, unspecified: Secondary | ICD-10-CM | POA: Insufficient documentation

## 2022-04-14 DIAGNOSIS — R600 Localized edema: Secondary | ICD-10-CM | POA: Insufficient documentation

## 2022-04-14 DIAGNOSIS — Z794 Long term (current) use of insulin: Secondary | ICD-10-CM | POA: Insufficient documentation

## 2022-04-14 NOTE — Telephone Encounter (Signed)
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 She is followed by Dr. Benay Spice and will receive chemo education on Monday.  PMH includes DM, HTN, and HLD.  Medications include Creon, B complex, Vit D, Lasix, Glucotrol XL, Lantus, Magnesium, Zofran, potassium chloride  Labs include Glucose 329 on June 27. Patient reports morning CBG has improved and was 240 today.  Height: 66 inches Weight: 242 pounds 13 oz. UBW: 250-275 pounds per patient BMI: 39.21.  Patient reports she is feeling a little better.  Reports she weighed 238 pounds this morning on her scale. Reports taking Creon before meals. Diarrhea improved slightly. Blood sugars are better overall. MD adjusting medications. Reports edema is much better. Endorses 35 pound wt loss over last 2 months.  Nutrition Diagnosis: Unintended wt loss related to pancreas cancer as evidenced by ~12% wt loss from UBW. (Unknown timeframe)  Intervention: Educated to try to eat small frequent meals and snacks with adequate calories and protein. Educated to take Creon with first bite of food. MD to adjust dosage as needed. Educated on no concentrated sweets diet for improved glycemic control. E-mail fact sheets to patient. E-mail address confirmed. Provide contact information.  Monitoring, Evaluation, Goals: Patient will tolerate adequate calories and protein to minimize loss of lean body mass.  Will follow up as needed.

## 2022-04-14 NOTE — Telephone Encounter (Signed)
Notified by radiology that CT scan on 04/07/22 was performed as pancreas protocol. OK to cancel today per Dr. Benay Spice. Sarah Davies was notified.

## 2022-04-15 ENCOUNTER — Telehealth: Payer: Self-pay | Admitting: *Deleted

## 2022-04-15 ENCOUNTER — Telehealth: Payer: Self-pay

## 2022-04-15 NOTE — Telephone Encounter (Signed)
Gonzella Lex, NP- [place]  Reporting home visits for patient-  New diagnosis: pancreatic cancer  Concern is elevated BP and patient does not have hospital f/u appointment until 7/21. Requesting patient be seen today/tomorrow.Call to office- there are no cancellations to put patient in per [facility]. I will send message to see if that appointment can be moved up- advised UC for elevated BP. Oncology had advised patient follow up with PCP for this. Ms. Harrold Donath states she will speak with her supervising provider and also advised UC to patient.   Yesterday: BP 185/102 P 73 98% room air , afebrile Lungs clear  Previous BP: 8pm last night : 170/102- advised take extra dose of lisinopril 20 mg. This morning:  175/101 with daily dosing of  lisinopril 20, carvedilol 50 mg lasix 20 mg No symptoms reported- no headache, blurred vision, chest pain reported- only complaint is - Abdominal discomfort "squeeze" into back- not tender to touch

## 2022-04-15 NOTE — Telephone Encounter (Signed)
TC from Gonzella Lex stated Sarah Davies is having issues with her blood pressure. Last night her B/P 185/102 she take a lisinopril, her B/P drop to 170/102. Today her B/P is 175/101.  I advice the patient Per Dr. Benay Spice to call her PCP.

## 2022-04-15 NOTE — Telephone Encounter (Signed)
Call from Gonzella Lex, NP with Palliative Team. Provided her recent CMP results. She is trying to help manage her HTN. Gave her extra dose of lisinopril last night and BP still 175/101 today. BLE edema is 1+ (slightly improved). She is very unhappy with current PCP and has appointment on 7/21 at PCP office of [facility] on [place] (Dr. Gypsy Balsam). Not able to be seen this week. Rates pain 3/10 and says Tramadol not very helpful, just makes her drowsy. Patient does not want anything stronger.

## 2022-04-15 NOTE — Telephone Encounter (Signed)
Spoke w/patient and got her r/s at the Patient [place] for 04-19-22.

## 2022-04-19 ENCOUNTER — Other Ambulatory Visit: Payer: Self-pay | Admitting: *Deleted

## 2022-04-19 ENCOUNTER — Other Ambulatory Visit: Payer: Self-pay | Admitting: Oncology

## 2022-04-19 ENCOUNTER — Inpatient Hospital Stay: Payer: Medicare PPO

## 2022-04-19 ENCOUNTER — Other Ambulatory Visit: Payer: Self-pay | Admitting: Internal Medicine

## 2022-04-19 ENCOUNTER — Inpatient Hospital Stay: Payer: Medicare PPO | Admitting: Nurse Practitioner

## 2022-04-19 ENCOUNTER — Encounter: Payer: Self-pay | Admitting: Nurse Practitioner

## 2022-04-19 ENCOUNTER — Ambulatory Visit (INDEPENDENT_AMBULATORY_CARE_PROVIDER_SITE_OTHER): Payer: Medicare PPO | Admitting: Nurse Practitioner

## 2022-04-19 VITALS — BP 161/78 | HR 76 | Temp 98.2°F | Resp 18 | Ht 66.0 in | Wt 233.6 lb

## 2022-04-19 VITALS — BP 147/72 | HR 75 | Temp 97.2°F | Ht 66.0 in | Wt 234.6 lb

## 2022-04-19 DIAGNOSIS — Z794 Long term (current) use of insulin: Secondary | ICD-10-CM | POA: Diagnosis not present

## 2022-04-19 DIAGNOSIS — E11649 Type 2 diabetes mellitus with hypoglycemia without coma: Secondary | ICD-10-CM

## 2022-04-19 DIAGNOSIS — B370 Candidal stomatitis: Secondary | ICD-10-CM | POA: Diagnosis not present

## 2022-04-19 DIAGNOSIS — E785 Hyperlipidemia, unspecified: Secondary | ICD-10-CM | POA: Diagnosis not present

## 2022-04-19 DIAGNOSIS — R197 Diarrhea, unspecified: Secondary | ICD-10-CM | POA: Diagnosis not present

## 2022-04-19 DIAGNOSIS — K8689 Other specified diseases of pancreas: Secondary | ICD-10-CM

## 2022-04-19 DIAGNOSIS — C250 Malignant neoplasm of head of pancreas: Secondary | ICD-10-CM

## 2022-04-19 DIAGNOSIS — R935 Abnormal findings on diagnostic imaging of other abdominal regions, including retroperitoneum: Secondary | ICD-10-CM | POA: Diagnosis not present

## 2022-04-19 DIAGNOSIS — R600 Localized edema: Secondary | ICD-10-CM | POA: Diagnosis not present

## 2022-04-19 DIAGNOSIS — Z5111 Encounter for antineoplastic chemotherapy: Secondary | ICD-10-CM | POA: Diagnosis present

## 2022-04-19 DIAGNOSIS — E1165 Type 2 diabetes mellitus with hyperglycemia: Secondary | ICD-10-CM | POA: Diagnosis not present

## 2022-04-19 DIAGNOSIS — C259 Malignant neoplasm of pancreas, unspecified: Secondary | ICD-10-CM | POA: Insufficient documentation

## 2022-04-19 DIAGNOSIS — E1140 Type 2 diabetes mellitus with diabetic neuropathy, unspecified: Secondary | ICD-10-CM | POA: Diagnosis not present

## 2022-04-19 DIAGNOSIS — E1169 Type 2 diabetes mellitus with other specified complication: Secondary | ICD-10-CM | POA: Diagnosis not present

## 2022-04-19 DIAGNOSIS — I10 Essential (primary) hypertension: Secondary | ICD-10-CM | POA: Diagnosis not present

## 2022-04-19 MED ORDER — PROCHLORPERAZINE MALEATE 10 MG PO TABS: 10.0000 mg | ORAL_TABLET | Freq: Four times a day (QID) | ORAL | 0 refills | Status: DC | PRN

## 2022-04-19 MED ORDER — AMLODIPINE BESYLATE 10 MG PO TABS: 10.0000 mg | ORAL_TABLET | Freq: Every day | ORAL | 2 refills | Status: DC

## 2022-04-19 MED ORDER — INSULIN GLARGINE 100 UNIT/ML SOLOSTAR PEN: 18.0000 [IU] | PEN_INJECTOR | Freq: Every day | SUBCUTANEOUS | 11 refills | Status: DC

## 2022-04-19 MED ORDER — LIDOCAINE-PRILOCAINE 2.5-2.5 % EX CREA: 1.0000 | TOPICAL_CREAM | CUTANEOUS | 0 refills | Status: DC | PRN

## 2022-04-19 NOTE — Progress Notes (Signed)
Formatting of this note might be different from the original.  Compazine and EMLA prescribed to pharmacy    Electronically signed by Ardell Isaacs, RN at 04/19/2022  4:05 PM EDT

## 2022-04-19 NOTE — Progress Notes (Signed)
Formatting of this note might be different from the original.  START ON PATHWAY REGIMEN - Pancreatic Adenocarcinoma      A cycle is every 28 days:      Nab-paclitaxel (protein bound)       Gemcitabine     **Always confirm dose/schedule in your pharmacy ordering system**    Patient Characteristics:  Metastatic Disease, First Line, PS = 0,1, BRCA1/2 and PALB2  Mutation Absent/Unknown  Therapeutic Status: Metastatic Disease  Line of Therapy: First Line  ECOG Performance Status: 1  BRCA1/2 Mutation Status: Quantity Not Sufficient  PALB2 Mutation Status: Quantity Not Sufficient  Intent of Therapy:  Non-Curative / Palliative Intent, Discussed with Patient  Electronically signed by Rana Snare, NP at 04/19/2022  5:43 PM EDT

## 2022-04-19 NOTE — Progress Notes (Signed)
Formatting of this note is different from the original.  Images from the original note were not included.    [facility]  OFFICE PROGRESS NOTE    Diagnosis: Pancreas cancer    INTERVAL HISTORY:     Lynn Blackwell returns as scheduled.  Diarrhea is some better since beginning pancreatic enzyme replacement.  She continues to have abdominal pain, describes as squeezing.  She has pain medication but does not take it.  Blood sugars are in the 300 range.  She has an appointment with PCP later today.    Objective:    Vital signs in last 24 hours:    Blood pressure (!) 161/78, pulse 76, temperature 98.2 F (36.8 C), temperature source Oral, resp. rate 18, height 5' 6 (1.676 m), weight 233 lb 9.6 oz (106 kg), SpO2 98 %.      HEENT: Mild white coating over tongue.  No buccal thrush.  No ulcers.  Resp: Lungs clear bilaterally.  Cardio: Regular rate and rhythm.  GI: Abdomen is soft.  No mass.  No hepatomegaly.  Vascular: Trace edema lower leg bilaterally right slightly greater than left.  Chronic stasis change bilaterally.    Lab Results:    Lab Results   Component Value Date    WBC 6.3 04/06/2022    HGB 12.1 04/06/2022    HCT 37.7 04/06/2022    MCV 85.5 04/06/2022    PLT 170 04/06/2022    NEUTROABS 4.0 04/06/2022     Imaging:    No results found.    Medications: I have reviewed the patient's current medications.    Assessment/Plan:  Pancreas cancer  03/22/2022 CT abdomen/pelvis-ill-defined 3.6 x 2.5 cm hypodense mass in the uncinate process of the pancreas; possible associated acute pancreatitis; fatty liver; faint hypodense focus in the inferior right lobe of the liver too small to characterize; no intrahepatic biliary dilatation.    03/23/2022 MRI-hypoenhancing mass in the uncinate process/inferior head of the pancreas abutting the SMV with distortion of the SMV, less than 180 degree involvement by MR but with limited assessment; pancreatic duct narrowing/obstruction and early involvement of the distal common bile  duct; subtle hepatic lesion in the right hepatic lobe; hazy mesentery may reflect concomitant mesenteritis or lymphatic congestion; small lymph nodes adjacent to the masslike area in the head of the pancreas.  03/26/2022 upper EUS-mass identified in the pancreatic head and uncinate process, appearance consistent with adenocarcinoma.  FNA pancreas head showed malignant cells consistent with adenocarcinoma.  04/07/2022 CT abdomen/pelvis pancreatic protocol-adenocarcinoma within the pancreatic uncinate process, isolated right hepatic lobe lesion suspicious for metastatic disease, contact of the SMV by tumor over an approximately 180 degree span with no evidence of occlusion and no arterial involvement, nonspecific borderline enlarged portacaval lymph node, small bowel mesenteric nodes most likely related to mesenteric adenitis/panniculitis  Diabetes with recent poor control  Diarrhea, typically after eating  Diabetic neuropathy  Hypertension  Hyperlipidemia  Leg edema-04/06/2022 bilateral lower extremities negative for DVT    Disposition: Lynn Blackwell appears unchanged.  Recent CT shows borderline resectable disease, concerning liver lesion.  Results reviewed with her at today's visit.  She does not appear to be a candidate for surgical resection based on age and comorbidities.  Dr. Kalman Drape again reviewed options to include comfort care versus a trial of systemic therapy.  She would like to proceed with treatment.  Dr. Kalman Drape recommendation is to begin treatment with gemcitabine/Abraxane.  She is scheduled for Port-A-Cath placement tomorrow.  We reviewed potential side  effects associated with chemotherapy including bone marrow toxicity, nausea, hair loss, mouth sores, allergic reaction.  We discussed the possibility of a fever or rash after Gemcitabine.  We also discussed pneumonitis associated with Gemcitabine.  We reviewed the neuropathy associated with Abraxane.  She agrees to proceed.  She will attend a  chemotherapy education class today.  Plan for cycle 1 gemcitabine/Abraxane 04/27/2022.  We will see her prior to cycle 2 on 05/11/2022.    Patient seen with Dr. Truett Perna.    Lonna Cobb ANP/GNP-BC     04/19/2022   10:45 AM  This was a shared visit with Lonna Cobb.  We discussed the CT findings with Lynn Blackwell.  We discussed treatment options.  She appears to have locally advanced versus metastatic pancreas cancer.  She does not appear a candidate for resection.  We discussed comfort care versus a trial of systemic therapy.  She would like to proceed with systemic therapy.  I recommend gemcitabine/Abraxane.  We reviewed potential toxicities associated with this regimen.  She agrees to proceed.  A chemotherapy plan was entered today.    I was present for greater than 50% of today's visit.  I performed medical decision making.    Mancel Bale, MD      Electronically signed by Ladene Artist, MD at 04/19/2022  5:44 PM EDT

## 2022-04-19 NOTE — Progress Notes (Signed)
Compazine and EMLA prescribed to pharmacy

## 2022-04-19 NOTE — Progress Notes (Signed)
[place] OFFICE PROGRESS NOTE   Diagnosis: Pancreas cancer  INTERVAL HISTORY:   Sarah Davies returns as scheduled.  Diarrhea is some better since beginning pancreatic enzyme replacement.  She continues to have abdominal pain, describes as "squeezing".  She has pain medication but does not take it.  Blood sugars are in the 300 range.  She has an appointment with PCP later today.  Objective:  Vital signs in last 24 hours:  Blood pressure (!) 161/78, pulse 76, temperature 98.2 F (36.8 C), temperature source Oral, resp. rate 18, height '5\' 6"'$  (1.676 m), weight 233 lb 9.6 oz (106 kg), SpO2 98 %.    HEENT: Mild white coating over tongue.  No buccal thrush.  No ulcers. Resp: Lungs clear bilaterally. Cardio: Regular rate and rhythm. GI: Abdomen is soft.  No mass.  No hepatomegaly. Vascular: Trace edema lower leg bilaterally right slightly greater than left.  Chronic stasis change bilaterally.  Lab Results:  Lab Results  Component Value Date   WBC 6.3 04/06/2022   HGB 12.1 04/06/2022   HCT 37.7 04/06/2022   MCV 85.5 04/06/2022   PLT 170 04/06/2022   NEUTROABS 4.0 04/06/2022    Imaging:  No results found.  Medications: I have reviewed the patient's current medications.  Assessment/Plan: Pancreas cancer 03/22/2022 CT abdomen/pelvis-ill-defined 3.6 x 2.5 cm hypodense mass in the uncinate process of the pancreas; possible associated acute pancreatitis; fatty liver; faint hypodense focus in the inferior right lobe of the liver too small to characterize; no intrahepatic biliary dilatation.   03/23/2022 MRI-hypoenhancing mass in the uncinate process/inferior head of the pancreas abutting the SMV with distortion of the SMV, less than 180 degree involvement by MR but with limited assessment; pancreatic duct narrowing/obstruction and early involvement of the distal common bile duct; subtle hepatic lesion in the right hepatic lobe; hazy mesentery may reflect concomitant  mesenteritis or lymphatic congestion; small lymph nodes adjacent to the masslike area in the head of the pancreas. 03/26/2022 upper EUS-mass identified in the pancreatic head and uncinate process, appearance consistent with adenocarcinoma.  FNA pancreas head showed malignant cells consistent with adenocarcinoma. 04/07/2022 CT abdomen/pelvis pancreatic protocol-adenocarcinoma within the pancreatic uncinate process, isolated right hepatic lobe lesion suspicious for metastatic disease, contact of the SMV by tumor over an approximately 180 degree span with no evidence of occlusion and no arterial involvement, nonspecific borderline enlarged portacaval lymph node, small bowel mesenteric nodes most likely related to mesenteric adenitis/panniculitis Diabetes with recent poor control Diarrhea, typically after eating Diabetic neuropathy Hypertension Hyperlipidemia Leg edema-04/06/2022 bilateral lower extremities negative for DVT  Disposition: Sarah Davies appears unchanged.  Recent CT shows borderline resectable disease, concerning liver lesion.  Results reviewed with her at today's visit.  She does not appear to be a candidate for surgical resection based on age and comorbidities.  Dr. Gearldine Shown again reviewed options to include comfort care versus a trial of systemic therapy.  She would like to proceed with treatment.  Dr. Gearldine Shown recommendation is to begin treatment with gemcitabine/Abraxane.  She is scheduled for Port-A-Cath placement tomorrow.  We reviewed potential side effects associated with chemotherapy including bone marrow toxicity, nausea, hair loss, mouth sores, allergic reaction.  We discussed the possibility of a fever or rash after Gemcitabine.  We also discussed pneumonitis associated with Gemcitabine.  We reviewed the neuropathy associated with Abraxane.  She agrees to proceed.  She will attend a chemotherapy education class today.  Plan for cycle 1 gemcitabine/Abraxane 04/27/2022.  We will see her  prior to cycle 2 on 05/11/2022.  Patient seen with Dr. Benay Spice.  Ned Card ANP/GNP-BC   04/19/2022  10:45 AM This was a shared visit with Ned Card.  We discussed the CT findings with Sarah Davies.  We discussed treatment options.  She appears to have locally advanced versus metastatic pancreas cancer.  She does not appear a candidate for resection.  We discussed comfort care versus a trial of systemic therapy.  She would like to proceed with systemic therapy.  I recommend gemcitabine/Abraxane.  We reviewed potential toxicities associated with this regimen.  She agrees to proceed.  A chemotherapy plan was entered today.   I was present for greater than 50% of today's visit.  I performed medical decision making.  Julieanne Manson, MD

## 2022-04-19 NOTE — Progress Notes (Signed)
START ON PATHWAY REGIMEN - Pancreatic Adenocarcinoma     A cycle is every 28 days:     Nab-paclitaxel (protein bound)      Gemcitabine   **Always confirm dose/schedule in your pharmacy ordering system**  Patient Characteristics: Metastatic Disease, First Line, PS = 0,1, BRCA1/2 and PALB2  Mutation Absent/Unknown Therapeutic Status: Metastatic Disease Line of Therapy: First Line ECOG Performance Status: 1 BRCA1/2 Mutation Status: Quantity Not Sufficient PALB2 Mutation Status: Quantity Not Sufficient Intent of Therapy: Non-Curative / Palliative Intent, Discussed with Patient

## 2022-04-19 NOTE — Patient Instructions (Addendum)
1. Uncontrolled type 2 diabetes mellitus with hypoglycemia, unspecified hypoglycemia coma status  - Ambulatory referral to Endocrinology - CBC - Comprehensive metabolic panel - may increase lantus to 18 units daily    Follow up:  Follow up in 3 months or sooner if needed

## 2022-04-19 NOTE — Progress Notes (Signed)
$'@Patient'T$  ID: Sarah Davies, female    DOB: Oct 14, 1940, 81 y.o.   MRN: [id number]  Chief Complaint  Patient presents with   Hospitalization Follow-up    High blood and diabetes, she was  dx with cancer ,  in the morning after 400-480 after breakfast , fasting 380 the  lowest 280  range .     Referring provider: No ref. provider found   HPI  Patient presents today to establish care.  She has recently been diagnosed with pancreatic cancer.  She has been followed by oncology and will start chemo treatments this month.  Patient was started on insulin to help control blood sugars.  She states that her blood sugars have still been elevated.  We discussed that we will send her to endocrinology for management.  She may increase Lantus to 18 units daily. Denies f/c/s, n/v/d, hemoptysis, PND, leg swelling Denies chest pain or edema      Allergies  Allergen Reactions   Nicotine Other (See Comments)    Throat closes    Other Other (See Comments)    Joint pain Other reaction(s): Other (See Comments) Animal Dander From when she was tested, it was stimulated.      Shellfish Allergy Other (See Comments)    Has never had a reaction, was just told through allergy testing that she is allergic to shellfish "Throat closes"   Molds & Smuts Other (See Comments)    From when she was tested, it was stimulated.   Other reaction(s): Other (See Comments) From when she was tested, it was stimulated.     Statins Other (See Comments)    Muscle aches    Immunization History  Administered Date(s) Administered   Fluad Quad(high Dose 65+) 07/27/2016, 10/31/2017   Influenza, High Dose Seasonal PF 10/17/2014   Influenza-Unspecified 07/23/2021   PFIZER(Purple Top)SARS-COV-2 Vaccination 12/10/2019, 01/02/2020   Pneumococcal Conjugate-13 07/27/2016   Pneumococcal-Unspecified 05/11/2007    Past Medical History:  Diagnosis Date   Allergy    Cataract    Diabetes mellitus without complication ([place])     Hypertension     Tobacco History: Social History   Tobacco Use  Smoking Status Never  Smokeless Tobacco Never   Counseling given: Not Answered   Outpatient Encounter Medications as of 04/19/2022  Medication Sig   amLODipine (NORVASC) 10 MG tablet Take 1 tablet (10 mg total) by mouth daily.   b complex vitamins tablet Take 1 tablet by mouth daily.   blood glucose meter kit and supplies Dispense based on patient and insurance preference. Use up to four times daily as directed. (FOR ICD-10 E10.9, E11.9).   calcium carbonate (OS-CAL) 600 MG TABS Take 600 mg by mouth 2 (two) times daily with a meal.   carvedilol (COREG) 25 MG tablet Take 50 mg by mouth 2 (two) times daily with a meal.   cholecalciferol (VITAMIN D) 1000 UNITS tablet Take 1,000 Units by mouth daily.   doxazosin (CARDURA) 4 MG tablet Take 4 mg by mouth at bedtime.    furosemide (LASIX) 20 MG tablet Take 20 mg by mouth daily.    glipiZIDE (GLUCOTROL XL) 5 MG 24 hr tablet Take 7.5 mg by mouth daily.   Insulin Pen Needle (PEN NEEDLES 3/16") 31G X 5 MM MISC 1 Application by Does not apply route daily.   lipase/protease/amylase (CREON) 36000 UNITS CPEP capsule Take 1 capsule (36,000 Units total) by mouth 3 (three) times daily before meals.   lisinopril (ZESTRIL) 20 MG tablet Take 20  mg by mouth daily.   magnesium 30 MG tablet Take 30 mg by mouth daily.   ondansetron (ZOFRAN) 4 MG tablet Take 1 tablet (4 mg total) by mouth every 6 (six) hours as needed for nausea.   potassium chloride SA (KLOR-CON M) 20 MEQ tablet Take 2 tablets (40 mEq total) by mouth daily for 3 days.   [DISCONTINUED] amLODipine (NORVASC) 5 MG tablet Take 5 mg by mouth daily.   [DISCONTINUED] insulin glargine (LANTUS) 100 UNIT/ML Solostar Pen Inject 15 Units into the skin daily.   insulin glargine (LANTUS) 100 UNIT/ML Solostar Pen Inject 18 Units into the skin daily.   No facility-administered encounter medications on file as of 04/19/2022.     Review of  Systems  Review of Systems  Constitutional: Negative.   HENT: Negative.    Cardiovascular: Negative.   Gastrointestinal: Negative.   Allergic/Immunologic: Negative.   Neurological: Negative.   Psychiatric/Behavioral: Negative.         Physical Exam  BP (!) 147/72   Pulse 75   Temp (!) 97.2 F (36.2 C)   Ht $R'5\' 6"'RD$  (1.676 m)   Wt 234 lb 9.6 oz (106.4 kg)   SpO2 100%   BMI 37.87 kg/m   Wt Readings from Last 5 Encounters:  04/27/22 233 lb 12.8 oz (106.1 kg)  04/20/22 234 lb 9.1 oz (106.4 kg)  04/19/22 234 lb 9.6 oz (106.4 kg)  04/19/22 233 lb 9.6 oz (106 kg)  04/06/22 242 lb 12.8 oz (110.1 kg)     Physical Exam Vitals and nursing note reviewed.  Constitutional:      General: She is not in acute distress.    Appearance: She is well-developed.  Cardiovascular:     Rate and Rhythm: Normal rate and regular rhythm.  Pulmonary:     Effort: Pulmonary effort is normal.     Breath sounds: Normal breath sounds.  Neurological:     Mental Status: She is alert and oriented to person, place, and time.      Lab Results:  CBC    Component Value Date/Time   WBC 6.3 04/06/2022 1345   WBC 5.1 03/24/2022 1215   RBC 4.41 04/06/2022 1345   HGB 12.1 04/06/2022 1345   HCT 37.7 04/06/2022 1345   PLT 170 04/06/2022 1345   MCV 85.5 04/06/2022 1345   MCV 89.3 02/04/2014 1455   MCH 27.4 04/06/2022 1345   MCHC 32.1 04/06/2022 1345   RDW 12.8 04/06/2022 1345   LYMPHSABS 1.6 04/06/2022 1345   MONOABS 0.6 04/06/2022 1345   EOSABS 0.2 04/06/2022 1345   BASOSABS 0.0 04/06/2022 1345    BMET    Component Value Date/Time   NA 138 04/06/2022 1345   K 3.8 04/06/2022 1345   CL 98 04/06/2022 1345   CO2 30 04/06/2022 1345   GLUCOSE 329 (H) 04/06/2022 1345   BUN 9 04/06/2022 1345   CREATININE 0.79 04/06/2022 1345   CALCIUM 9.9 04/06/2022 1345   GFRNONAA >60 04/06/2022 1345    BNP No results found for: "BNP"  ProBNP No results found for: "PROBNP"  Imaging: IR IMAGING  GUIDED PORT INSERTION  Result Date: 04/20/2022 CLINICAL DATA:  PANCREAS CANCER EXAM: RIGHT INTERNAL JUGULAR SINGLE LUMEN POWER PORT CATHETER INSERTION Date:  04/20/2022 04/20/2022 3:47 pm Radiologist:  Jerilynn Mages. Daryll Brod, MD Guidance:  Ultrasound and fluoroscopic MEDICATIONS: 1% lidocaine local with epinephrine ANESTHESIA/SEDATION: Versed 1.0 mg IV; Fentanyl 100 mcg IV; Moderate Sedation Time:  27 minute The patient was continuously monitored during the  procedure by the interventional radiology nurse under my direct supervision. FLUOROSCOPY: (8.0 mGy) COMPLICATIONS: None immediate. CONTRAST:  None. PROCEDURE: Informed consent was obtained from the patient following explanation of the procedure, risks, benefits and alternatives. The patient understands, agrees and consents for the procedure. All questions were addressed. A time out was performed. Maximal barrier sterile technique utilized including caps, mask, sterile gowns, sterile gloves, large sterile drape, hand hygiene, and 2% chlorhexidine scrub. Under sterile conditions and local anesthesia, right internal jugular micropuncture venous access was performed. Access was performed with ultrasound. Images were obtained for documentation of the patent right internal jugular vein. A guide wire was inserted followed by a transitional dilator. This allowed insertion of a guide wire and catheter into the IVC. Measurements were obtained from the SVC / RA junction back to the right IJ venotomy site. In the right infraclavicular chest, a subcutaneous pocket was created over the second anterior rib. This was done under sterile conditions and local anesthesia. 1% lidocaine with epinephrine was utilized for this. A 2.5 cm incision was made in the skin. Blunt dissection was performed to create a subcutaneous pocket over the right pectoralis major muscle. The pocket was flushed with saline vigorously. There was adequate hemostasis. The port catheter was assembled and checked for  leakage. The port catheter was secured in the pocket with two retention sutures. The tubing was tunneled subcutaneously to the right venotomy site and inserted into the SVC/RA junction through a valved peel-away sheath. Position was confirmed with fluoroscopy. Images were obtained for documentation. The patient tolerated the procedure well. No immediate complications. Incisions were closed in a two layer fashion with 4 - 0 Vicryl suture. Dermabond was applied to the skin. The port catheter was accessed, blood was aspirated followed by saline and heparin flushes. Needle was removed. A dry sterile dressing was applied. IMPRESSION: Ultrasound and fluoroscopically guided right internal jugular single lumen power port catheter insertion. Tip in the SVC/RA junction. Catheter ready for use. Electronically Signed   By: Jerilynn Mages.  Shick M.D.   On: 04/20/2022 16:27   CT ABD PELVIS W/WO CM ONCOLOGY PANCREATIC PROTOCOL  Result Date: 04/07/2022 CLINICAL DATA:  Pancreatic cancer, staging. * Tracking Code: BO * EXAM: CT ABDOMEN AND PELVIS WITHOUT AND WITH CONTRAST TECHNIQUE: Multidetector CT imaging of the abdomen and pelvis was performed following the standard protocol before and following the bolus administration of intravenous contrast. RADIATION DOSE REDUCTION: This exam was performed according to the departmental dose-optimization program which includes automated exposure control, adjustment of the mA and/or kV according to patient size and/or use of iterative reconstruction technique. CONTRAST:  15mL OMNIPAQUE IOHEXOL 300 MG/ML  SOLN COMPARISON:  03/23/2022 MRI.  03/22/2022 CT FINDINGS: Lower chest: Clear lung bases. Mild cardiomegaly, without pericardial or pleural effusion. Hepatobiliary: Segment 5-6 1.4 cm hypoattenuating lesion on 70/13 corresponds to mild T2 hyperintensity on the prior MRI and is again suspicious for metastatic disease. Cholecystectomy. Upper normal common duct of 1.0 cm in the porta hepatis on 62/13. No  common duct obstruction. Pancreas: Mild pancreatic duct dilatation within the neck and head. More focal pancreatic body cystic lesion versus side branch duct ectasia of 1.0 cm on 64/13. Duct dilatation is followed to the level of a hypoenhancing pancreatic uncinate process mass which measures 2.9 x 2.8 cm on 82/13. 3.1 cm craniocaudal on 108/15. Improvement in mild surrounding edema. Spleen: Normal in size, without focal abnormality. Adrenals/Urinary Tract: Normal adrenal glands. Left renal too small to characterize lesions. No hydronephrosis. Normal urinary bladder.  Stomach/Bowel: Proximal gastric underdistention. Transverse duodenum is intimately associated with the pancreatic tumor including on 87/13. No bowel obstruction. Normal colon, appendix, and terminal ileum. Otherwise normal small bowel Vascular/Lymphatic: Aortic atherosclerosis. Celiac and SMA uninvolved by tumor. Splenic vein and portal vein uninvolved. The superior mesenteric vein is contacted by tumor over approximately 180 degrees, including on 83/13. Suspect mild tumor narrowing including on 116/15. No occlusion or underlying thrombus. Porta hepatis 1.2 cm node on 58/13 is upper normal size. Multiple jejunal mesenteric nodes are prominent in the setting of increased density in the mesenteric fat. Upper normal bilateral external iliac nodes are most likely reactive and not in a distribution for pancreatic primary. Reproductive: Hysterectomy.  No adnexal mass. Other: Pelvic floor laxity. No significant free fluid. No evidence of omental or peritoneal disease. Prior ventral abdominal wall hernia repair. Residual or recurrent fat containing hernia within the upper pelvis is small. Musculoskeletal: Lumbosacral spondylosis. IMPRESSION: 1. Adenocarcinoma within the pancreatic uncinate process, as before. Relatively similar in size to the MRI of 2 weeks ago. 2. Isolated right hepatic lobe lesion which is suspicious for metastatic disease. 3. Contact of the  SMV by tumor over an approximately 180 degree span. No evidence of occlusion. No arterial involvement. 4. Nonspecific borderline enlarged portacaval node. Small bowel mesenteric nodes are most likely related to mesenteric adenitis/panniculitis, given concurrent jejunal mesenteric edema. 5.  Aortic Atherosclerosis (ICD10-I70.0). 6. Prior ventral abdominal wall hernia repair with residual/recurrent fat containing upper pelvic hernia. Electronically Signed   By: Abigail Miyamoto M.D.   On: 04/07/2022 11:41   US Venous Img Lower Bilateral (DVT)  Result Date: 04/07/2022 CLINICAL DATA:  81 year old female with leg edema and pancreatic cancer EXAM: BILATERAL LOWER EXTREMITY VENOUS DOPPLER ULTRASOUND TECHNIQUE: Gray-scale sonography with graded compression, as well as color Doppler and duplex ultrasound were performed to evaluate the lower extremity deep venous systems from the level of the common femoral vein and including the common femoral, femoral, profunda femoral, popliteal and calf veins including the posterior tibial, peroneal and gastrocnemius veins when visible. The superficial great saphenous vein was also interrogated. Spectral Doppler was utilized to evaluate flow at rest and with distal augmentation maneuvers in the common femoral, femoral and popliteal veins. COMPARISON:  None Available. FINDINGS: RIGHT LOWER EXTREMITY Common Femoral Vein: No evidence of thrombus. Normal compressibility, respiratory phasicity and response to augmentation. Saphenofemoral Junction: No evidence of thrombus. Normal compressibility and flow on color Doppler imaging. Profunda Femoral Vein: No evidence of thrombus. Normal compressibility and flow on color Doppler imaging. Femoral Vein: No evidence of thrombus. Normal compressibility, respiratory phasicity and response to augmentation. Popliteal Vein: No evidence of thrombus. Normal compressibility, respiratory phasicity and response to augmentation. Calf Veins: No evidence of  thrombus. Normal compressibility and flow on color Doppler imaging. Superficial Great Saphenous Vein: No evidence of thrombus. Normal compressibility and flow on color Doppler imaging. Other Findings:  None. LEFT LOWER EXTREMITY Common Femoral Vein: No evidence of thrombus. Normal compressibility, respiratory phasicity and response to augmentation. Saphenofemoral Junction: No evidence of thrombus. Normal compressibility and flow on color Doppler imaging. Profunda Femoral Vein: No evidence of thrombus. Normal compressibility and flow on color Doppler imaging. Femoral Vein: No evidence of thrombus. Normal compressibility, respiratory phasicity and response to augmentation. Popliteal Vein: No evidence of thrombus. Normal compressibility, respiratory phasicity and response to augmentation. Calf Veins: No evidence of thrombus. Normal compressibility and flow on color Doppler imaging. Superficial Great Saphenous Vein: No evidence of thrombus. Normal compressibility and flow on color Doppler  imaging. Other Findings:  None. IMPRESSION: Directed duplex of the bilateral lower extremities negative for DVT Signed, Dulcy Fanny. Nadene Rubins, RPVI [facility] Electronically Signed   By: Corrie Mckusick D.O.   On: 04/07/2022 07:46     Assessment & Plan:   Uncontrolled type 2 diabetes mellitus with hypoglycemia ([place]) - Ambulatory referral to Endocrinology - CBC - Comprehensive metabolic panel - may increase lantus to 18 units daily    Follow up:  Follow up in 3 months or sooner if needed     Fenton Foy, NP 04/28/2022

## 2022-04-20 ENCOUNTER — Telehealth: Payer: Self-pay

## 2022-04-20 ENCOUNTER — Encounter (HOSPITAL_COMMUNITY): Payer: Self-pay

## 2022-04-20 ENCOUNTER — Other Ambulatory Visit: Payer: Self-pay | Admitting: *Deleted

## 2022-04-20 ENCOUNTER — Ambulatory Visit (HOSPITAL_COMMUNITY)
Admission: RE | Admit: 2022-04-20 | Discharge: 2022-04-20 | Disposition: A | Discharge status: Home / Self Care | Payer: Medicare PPO | Source: Ambulatory Visit | Attending: Nurse Practitioner | Admitting: Nurse Practitioner

## 2022-04-20 ENCOUNTER — Other Ambulatory Visit: Payer: Self-pay

## 2022-04-20 DIAGNOSIS — C250 Malignant neoplasm of head of pancreas: Secondary | ICD-10-CM | POA: Insufficient documentation

## 2022-04-20 DIAGNOSIS — K8689 Other specified diseases of pancreas: Secondary | ICD-10-CM

## 2022-04-20 DIAGNOSIS — I10 Essential (primary) hypertension: Secondary | ICD-10-CM | POA: Diagnosis not present

## 2022-04-20 DIAGNOSIS — E119 Type 2 diabetes mellitus without complications: Secondary | ICD-10-CM | POA: Insufficient documentation

## 2022-04-20 DIAGNOSIS — Z7984 Long term (current) use of oral hypoglycemic drugs: Secondary | ICD-10-CM | POA: Diagnosis not present

## 2022-04-20 DIAGNOSIS — Z794 Long term (current) use of insulin: Secondary | ICD-10-CM | POA: Diagnosis not present

## 2022-04-20 HISTORY — PX: IR IMAGING GUIDED PORT INSERTION: IMG5740

## 2022-04-20 LAB — GLUCOSE, CAPILLARY
Glucose-Capillary: 357 mg/dL — ABNORMAL HIGH (ref 70–99)
Glucose-Capillary: 317 mg/dL — ABNORMAL HIGH (ref 70–99)

## 2022-04-20 MED ORDER — HEPARIN SOD (PORK) LOCK FLUSH 100 UNIT/ML IV SOLN
INTRAVENOUS | Status: AC
  Filled 2022-04-20: qty 5

## 2022-04-20 MED ORDER — FENTANYL CITRATE (PF) 100 MCG/2ML IJ SOLN
INTRAMUSCULAR | Status: AC | PRN
  Administered 2022-04-20 (×2): 50 ug via INTRAVENOUS

## 2022-04-20 MED ORDER — MIDAZOLAM HCL 2 MG/2ML IJ SOLN
INTRAMUSCULAR | Status: AC | PRN
  Administered 2022-04-20 (×2): .5 mg via INTRAVENOUS

## 2022-04-20 MED ORDER — LIDOCAINE-EPINEPHRINE 1 %-1:100000 IJ SOLN
INTRAMUSCULAR | Status: AC
  Filled 2022-04-20: qty 1

## 2022-04-20 MED ORDER — FENTANYL CITRATE (PF) 100 MCG/2ML IJ SOLN
INTRAMUSCULAR | Status: AC
  Filled 2022-04-20: qty 2

## 2022-04-20 MED ORDER — SODIUM CHLORIDE 0.9 % IV SOLN: INTRAVENOUS | Status: DC

## 2022-04-20 MED ORDER — HEPARIN SOD (PORK) LOCK FLUSH 100 UNIT/ML IV SOLN
INTRAVENOUS | Status: AC | PRN
  Administered 2022-04-20: 500 [IU] via INTRAVENOUS

## 2022-04-20 MED ORDER — LIDOCAINE-EPINEPHRINE 1 %-1:100000 IJ SOLN
INTRAMUSCULAR | Status: AC | PRN
  Administered 2022-04-20: 20 mL

## 2022-04-20 MED ORDER — MIDAZOLAM HCL 2 MG/2ML IJ SOLN
INTRAMUSCULAR | Status: AC
  Filled 2022-04-20: qty 2

## 2022-04-20 NOTE — Discharge Instructions (Signed)
Discharge Instructions:   Please call Interventional Radiology clinic [phone] with any questions or concerns.  You may remove your dressing and shower tomorrow.  Do not use EMLA / Lidocaine cream for 2 weeks post Port Insertion.   Moderate Conscious Sedation, Adult, Care After This sheet gives you information about how to care for yourself after your procedure. Your health care provider may also give you more specific instructions. If you have problems or questions, contact your health care provider. What can I expect after the procedure? After the procedure, it is common to have: Sleepiness for several hours. Impaired judgment for several hours. Difficulty with balance. Vomiting if you eat too soon. Follow these instructions at home: For the time period you were told by your health care provider:  Rest. Do not participate in activities where you could fall or become injured. Do not drive or use machinery. Do not drink alcohol. Do not take sleeping pills or medicines that cause drowsiness. Do not make important decisions or sign legal documents. Do not take care of children on your own. Eating and drinking  Follow the diet recommended by your health care provider. Drink enough fluid to keep your urine pale yellow. If you vomit: Drink water, juice, or soup when you can drink without vomiting. Make sure you have little or no nausea before eating solid foods. General instructions Take over-the-counter and prescription medicines only as told by your health care provider. Have a responsible adult stay with you for the time you are told. It is important to have someone help care for you until you are awake and alert. Do not smoke. Keep all follow-up visits as told by your health care provider. This is important. Contact a health care provider if: You are still sleepy or having trouble with balance after 24 hours. You feel light-headed. You keep feeling nauseous or you keep  vomiting. You develop a rash. You have a fever. You have redness or swelling around the IV site. Get help right away if: You have trouble breathing. You have new-onset confusion at home. Summary After the procedure, it is common to feel sleepy, have impaired judgment, or feel nauseous if you eat too soon. Rest after you get home. Know the things you should not do after the procedure. Follow the diet recommended by your health care provider and drink enough fluid to keep your urine pale yellow. Get help right away if you have trouble breathing or new-onset confusion at home. This information is not intended to replace advice given to you by your health care provider. Make sure you discuss any questions you have with your health care provider. Document Revised: 01/25/2020 Document Reviewed: 08/23/2019 Elsevier Patient Education  2023 [employer]    Implanted Port Insertion, Care After The following information offers guidance on how to care for yourself after your procedure. Your health care provider may also give you more specific instructions. If you have problems or questions, contact your health care provider. What can I expect after the procedure? After the procedure, it is common to have: Discomfort at the port insertion site. Bruising on the skin over the port. This should improve over 3-4 days. Follow these instructions at home: Port care After your port is placed, you will get a manufacturer's information card. The card has information about your port. Keep this card with you at all times. Take care of the port as told by your health care provider. Ask your health care provider if you or a family   member can get training for taking care of the port at home. A home health care nurse will be be available to help care for the port. Make sure to remember what type of port you have. Incision care     Follow instructions from your health care provider about how to take care of  your port insertion site. Make sure you: Wash your hands with soap and water for at least 20 seconds before and after you change your bandage (dressing). If soap and water are not available, use hand sanitizer. Change your dressing as told by your health care provider. Leave stitches (sutures), skin glue, or adhesive strips in place. These skin closures may need to stay in place for 2 weeks or longer. If adhesive strip edges start to loosen and curl up, you may trim the loose edges. Do not remove adhesive strips completely unless your health care provider tells you to do that. Check your port insertion site every day for signs of infection. Check for: Redness, swelling, or pain. Fluid or blood. Warmth. Pus or a bad smell. Activity Return to your normal activities as told by your health care provider. Ask your health care provider what activities are safe for you. You may have to avoid lifting. Ask your health care provider how much you can safely lift. General instructions Take over-the-counter and prescription medicines only as told by your health care provider. Do not take baths, swim, or use a hot tub until your health care provider approves. Ask your health care provider if you may take showers. You may only be allowed to take sponge baths. If you were given a sedative during the procedure, it can affect you for several hours. Do not drive or operate machinery until your health care provider says that it is safe. Wear a medical alert bracelet in case of an emergency. This will tell any health care providers that you have a port. Keep all follow-up visits. This is important. Contact a health care provider if: You cannot flush your port with saline as directed, or you cannot draw blood from the port. You have a fever or chills. You have redness, swelling, or pain around your port insertion site. You have fluid or blood coming from your port insertion site. Your port insertion site feels warm  to the touch. You have pus or a bad smell coming from the port insertion site. Get help right away if: You have chest pain or shortness of breath. You have bleeding from your port that you cannot control. These symptoms may be an emergency. Get help right away. Call 911. Do not wait to see if the symptoms will go away. Do not drive yourself to the hospital. Summary Take care of the port as told by your health care provider. Keep the manufacturer's information card with you at all times. Change your dressing as told by your health care provider. Contact a health care provider if you have a fever or chills or if you have redness, swelling, or pain around your port insertion site. Keep all follow-up visits. This information is not intended to replace advice given to you by your health care provider. Make sure you discuss any questions you have with your health care provider. Document Revised: 03/31/2021 Document Reviewed: 03/31/2021 Elsevier Patient Education  2023 [employer]    

## 2022-04-20 NOTE — Progress Notes (Signed)
1220 CBG reported to PA will check CBG again before procedure.  Made aware the the am insulin and po diabetic medication was taken this am at 0700.

## 2022-04-20 NOTE — Progress Notes (Signed)
1124 CBG 324 on arrival didn't record on the flow sheet will report to the PA.

## 2022-04-20 NOTE — Progress Notes (Signed)
1650 Ice pack send home with patient with instructions.

## 2022-04-20 NOTE — Procedures (Signed)
Interventional Radiology Procedure Note  Procedure: RT IJ POWER PORT    Complications: None  Estimated Blood Loss:  MIN  Findings: TIP SVCRA    M. TREVOR Odester Nilson, MD    

## 2022-04-20 NOTE — Telephone Encounter (Signed)
Faxed over the patient  Glucose result to her PCP Fenton Foy NP [phone].

## 2022-04-20 NOTE — Consult Note (Signed)
Chief Complaint: Patient was seen in consultation today for port a cath placement  Referring Physician(s): Sherrill,B  Supervising Physician: Daryll Brod  Patient Status: Sarah Davies  History of Present Illness: Sarah Davies is a 81 y.o. female with PMH sig for HTN,DM, and newly diagnosed pancreatic cancer. She presents today for port a cath placement to assist with treatment.   Past Medical History:  Diagnosis Date   Allergy    Cataract    Diabetes mellitus without complication ([place])    Hypertension     Past Surgical History:  Procedure Laterality Date   ABDOMINAL HYSTERECTOMY     CHOLECYSTECTOMY     ESOPHAGOGASTRODUODENOSCOPY (EGD) WITH PROPOFOL N/A 03/26/2022   Procedure: ESOPHAGOGASTRODUODENOSCOPY (EGD) WITH PROPOFOL;  Surgeon: Carol Ada, MD;  Location: [facility];  Service: Gastroenterology;  Laterality: N/A;   EUS N/A 03/26/2022   Procedure: UPPER ENDOSCOPIC ULTRASOUND (EUS) LINEAR;  Surgeon: Carol Ada, MD;  Location: [facility];  Service: Gastroenterology;  Laterality: N/A;   FINE NEEDLE ASPIRATION N/A 03/26/2022   Procedure: FINE NEEDLE ASPIRATION (FNA) LINEAR;  Surgeon: Carol Ada, MD;  Location: [facility];  Service: Gastroenterology;  Laterality: N/A;   SPINE SURGERY      Allergies: Nicotine, Other, Shellfish allergy, Molds & smuts, and Statins  Medications: Prior to Admission medications   Medication Sig Start Date End Date Taking? Authorizing Provider  amLODipine (NORVASC) 10 MG tablet Take 1 tablet (10 mg total) by mouth daily. 04/19/22 07/18/22 Yes Fenton Foy, NP  b complex vitamins tablet Take 1 tablet by mouth daily.   Yes [provider]  blood glucose meter kit and supplies Dispense based on patient and insurance preference. Use up to four times daily as directed. (FOR ICD-10 E10.9, E11.9). 03/24/22  Yes Regalado, Belkys A, MD  calcium carbonate (OS-CAL) 600 MG TABS Take 600 mg by mouth 2 (two) times daily with a  meal.   Yes [provider]  carvedilol (COREG) 25 MG tablet Take 50 mg by mouth 2 (two) times daily with a meal. 03/21/13  Yes [provider]  cholecalciferol (VITAMIN D) 1000 UNITS tablet Take 1,000 Units by mouth daily.   Yes [provider]  doxazosin (CARDURA) 4 MG tablet Take 4 mg by mouth at bedtime.  01/19/13  Yes [provider]  furosemide (LASIX) 20 MG tablet Take 20 mg by mouth daily.  03/16/13  Yes [provider]  glipiZIDE (GLUCOTROL XL) 5 MG 24 hr tablet Take 7.5 mg by mouth daily. 01/29/22  Yes [provider]  insulin glargine (LANTUS) 100 UNIT/ML Solostar Pen Inject 18 Units into the skin daily. 04/19/22  Yes Fenton Foy, NP  lipase/protease/amylase (CREON) 36000 UNITS CPEP capsule Take 1 capsule (36,000 Units total) by mouth 3 (three) times daily before meals. 03/24/22  Yes Regalado, Belkys A, MD  lisinopril (ZESTRIL) 20 MG tablet Take 20 mg by mouth daily. 03/02/13  Yes [provider]  magnesium 30 MG tablet Take 30 mg by mouth daily.   Yes [provider]  prochlorperazine (COMPAZINE) 10 MG tablet Take 1 tablet (10 mg total) by mouth every 6 (six) hours as needed for nausea or vomiting. 04/19/22  Yes Ladell Pier, MD  Insulin Pen Needle (PEN NEEDLES 3/16") 31G X 5 MM MISC 1 Application by Does not apply route daily. 03/24/22   Regalado, Belkys A, MD  lidocaine-prilocaine (EMLA) cream Apply 1 Application topically as needed. 04/19/22   Ladell Pier, MD  ondansetron (  ZOFRAN) 4 MG tablet Take 1 tablet (4 mg total) by mouth every 6 (six) hours as needed for nausea. 03/24/22   Regalado, Belkys A, MD  potassium chloride SA (KLOR-CON M) 20 MEQ tablet Take 2 tablets (40 mEq total) by mouth daily for 3 days. 03/24/22 04/19/22  Regalado, Cassie Freer, MD     Family History  Problem Relation Age of Onset   Breast cancer Sister 30 - 75       metastatic   Throat cancer Sister    Cancer Maternal Grandmother         unknown type    Social History   Socioeconomic History   Marital status: Married    Spouse name: Not on file   Number of children: 2   Years of education: Not on file   Highest education level: Not on file  Occupational History   Not on file  Tobacco Use   Smoking status: Never   Smokeless tobacco: Never  Substance and Sexual Activity   Alcohol use: No   Drug use: No   Sexual activity: Never    Birth control/protection: Abstinence  Other Topics Concern   Not on file  Social History Narrative   Not on file   Social Determinants of Health   Financial Resource Strain: Low Risk  (04/07/2022)   Overall Financial Resource Strain (CARDIA)    Difficulty of Paying Living Expenses: Not hard at all  Food Insecurity: No Food Insecurity (04/07/2022)   Hunger Vital Sign    Worried About Running Out of Food in the Last Year: Never true    [place] in the Last Year: Never true  Transportation Needs: No Transportation Needs (04/07/2022)   PRAPARE - Hydrologist (Medical): No    Lack of Transportation (Non-Medical): No  Physical Activity: Not on file  Stress: No Stress Concern Present (04/07/2022)   [place]    Feeling of Stress : Only a little  Social Connections: Not on file      Review of Systems currently denies fever, headache, chest pain, dyspnea, cough, nausea, vomiting or bleeding; he does have back pain as well as mid abdominal pain upon palpation  Vital Signs: BP (!) 153/73   Pulse 66   Temp 98.4 F (36.9 C) (Oral)   Resp 18   Ht _0  (1.676 m)   Wt 234 lb 9.1 oz (106.4 kg)   BMI 37.86 kg/m      Physical Exam awake, alert.  Chest clear to auscultation bilaterally.  Heart with regular rate and rhythm.  Abdomen soft, positive bowel sounds, tender epigastric region to palpation.  Trace pretibial edema noted bilaterally  Imaging: CT ABD PELVIS W/WO CM ONCOLOGY  PANCREATIC PROTOCOL  Result Date: 04/07/2022 CLINICAL DATA:  Pancreatic cancer, staging. * Tracking Code: BO * EXAM: CT ABDOMEN AND PELVIS WITHOUT AND WITH CONTRAST TECHNIQUE: Multidetector CT imaging of the abdomen and pelvis was performed following the standard protocol before and following the bolus administration of intravenous contrast. RADIATION DOSE REDUCTION: This exam was performed according to the departmental dose-optimization program which includes automated exposure control, adjustment of the mA and/or kV according to patient size and/or use of iterative reconstruction technique. CONTRAST:  117m OMNIPAQUE IOHEXOL 300 MG/ML  SOLN COMPARISON:  03/23/2022 MRI.  03/22/2022 CT FINDINGS: Lower chest: Clear lung bases. Mild cardiomegaly, without pericardial or pleural effusion. Hepatobiliary: Segment 5-6 1.4 cm hypoattenuating lesion on 70/13  corresponds to mild T2 hyperintensity on the prior MRI and is again suspicious for metastatic disease. Cholecystectomy. Upper normal common duct of 1.0 cm in the porta hepatis on 62/13. No common duct obstruction. Pancreas: Mild pancreatic duct dilatation within the neck and head. More focal pancreatic body cystic lesion versus side branch duct ectasia of 1.0 cm on 64/13. Duct dilatation is followed to the level of a hypoenhancing pancreatic uncinate process mass which measures 2.9 x 2.8 cm on 82/13. 3.1 cm craniocaudal on 108/15. Improvement in mild surrounding edema. Spleen: Normal in size, without focal abnormality. Adrenals/Urinary Tract: Normal adrenal glands. Left renal too small to characterize lesions. No hydronephrosis. Normal urinary bladder. Stomach/Bowel: Proximal gastric underdistention. Transverse duodenum is intimately associated with the pancreatic tumor including on 87/13. No bowel obstruction. Normal colon, appendix, and terminal ileum. Otherwise normal small bowel Vascular/Lymphatic: Aortic atherosclerosis. Celiac and SMA uninvolved by tumor. Splenic  vein and portal vein uninvolved. The superior mesenteric vein is contacted by tumor over approximately 180 degrees, including on 83/13. Suspect mild tumor narrowing including on 116/15. No occlusion or underlying thrombus. Porta hepatis 1.2 cm node on 58/13 is upper normal size. Multiple jejunal mesenteric nodes are prominent in the setting of increased density in the mesenteric fat. Upper normal bilateral external iliac nodes are most likely reactive and not in a distribution for pancreatic primary. Reproductive: Hysterectomy.  No adnexal mass. Other: Pelvic floor laxity. No significant free fluid. No evidence of omental or peritoneal disease. Prior ventral abdominal wall hernia repair. Residual or recurrent fat containing hernia within the upper pelvis is small. Musculoskeletal: Lumbosacral spondylosis. IMPRESSION: 1. Adenocarcinoma within the pancreatic uncinate process, as before. Relatively similar in size to the MRI of 2 weeks ago. 2. Isolated right hepatic lobe lesion which is suspicious for metastatic disease. 3. Contact of the SMV by tumor over an approximately 180 degree span. No evidence of occlusion. No arterial involvement. 4. Nonspecific borderline enlarged portacaval node. Small bowel mesenteric nodes are most likely related to mesenteric adenitis/panniculitis, given concurrent jejunal mesenteric edema. 5.  Aortic Atherosclerosis (ICD10-I70.0). 6. Prior ventral abdominal wall hernia repair with residual/recurrent fat containing upper pelvic hernia. Electronically Signed   By: Abigail Miyamoto M.D.   On: 04/07/2022 11:41   US Venous Img Lower Bilateral (DVT)  Result Date: 04/07/2022 CLINICAL DATA:  81 year old female with leg edema and pancreatic cancer EXAM: BILATERAL LOWER EXTREMITY VENOUS DOPPLER ULTRASOUND TECHNIQUE: Gray-scale sonography with graded compression, as well as color Doppler and duplex ultrasound were performed to evaluate the lower extremity deep venous systems from the level of the  common femoral vein and including the common femoral, femoral, profunda femoral, popliteal and calf veins including the posterior tibial, peroneal and gastrocnemius veins when visible. The superficial great saphenous vein was also interrogated. Spectral Doppler was utilized to evaluate flow at rest and with distal augmentation maneuvers in the common femoral, femoral and popliteal veins. COMPARISON:  None Available. FINDINGS: RIGHT LOWER EXTREMITY Common Femoral Vein: No evidence of thrombus. Normal compressibility, respiratory phasicity and response to augmentation. Saphenofemoral Junction: No evidence of thrombus. Normal compressibility and flow on color Doppler imaging. Profunda Femoral Vein: No evidence of thrombus. Normal compressibility and flow on color Doppler imaging. Femoral Vein: No evidence of thrombus. Normal compressibility, respiratory phasicity and response to augmentation. Popliteal Vein: No evidence of thrombus. Normal compressibility, respiratory phasicity and response to augmentation. Calf Veins: No evidence of thrombus. Normal compressibility and flow on color Doppler imaging. Superficial Great Saphenous Vein: No evidence of thrombus.  Normal compressibility and flow on color Doppler imaging. Other Findings:  None. LEFT LOWER EXTREMITY Common Femoral Vein: No evidence of thrombus. Normal compressibility, respiratory phasicity and response to augmentation. Saphenofemoral Junction: No evidence of thrombus. Normal compressibility and flow on color Doppler imaging. Profunda Femoral Vein: No evidence of thrombus. Normal compressibility and flow on color Doppler imaging. Femoral Vein: No evidence of thrombus. Normal compressibility, respiratory phasicity and response to augmentation. Popliteal Vein: No evidence of thrombus. Normal compressibility, respiratory phasicity and response to augmentation. Calf Veins: No evidence of thrombus. Normal compressibility and flow on color Doppler imaging.  Superficial Great Saphenous Vein: No evidence of thrombus. Normal compressibility and flow on color Doppler imaging. Other Findings:  None. IMPRESSION: Directed duplex of the bilateral lower extremities negative for DVT Signed, Dulcy Fanny. Nadene Rubins, RPVI [facility] Electronically Signed   By: Corrie Mckusick D.O.   On: 04/07/2022 07:46   MR ABDOMEN MRCP W WO CONTAST  Result Date: 03/23/2022 CLINICAL DATA:  Suspected pancreatic mass in a patient with history of potential pancreatitis. EXAM: MRI ABDOMEN WITHOUT AND WITH CONTRAST (INCLUDING MRCP) TECHNIQUE: Multiplanar multisequence MR imaging of the abdomen was performed both before and after the administration of intravenous contrast. Heavily T2-weighted images of the biliary and pancreatic ducts were obtained, and three-dimensional MRCP images were rendered by post processing. CONTRAST:  25m GADAVIST GADOBUTROL 1 MMOL/ML IV SOLN COMPARISON:  CT evaluation of June 12 of 2023. FINDINGS: Lower chest: Incidental imaging of the lung bases is unremarkable. Limited assessment on MRI. Hepatobiliary: Subtle hepatic lesion in the RIGHT hepatic lobe, hepatic subsegment V/VI (image 23/5) also visible on diffusion-weighted imaging. Area not well assessed on post-contrast imaging due to motion related artifact. Common bile duct is less than a cm greatest caliber in the setting of prior cholecystectomy is mildly irregular. Some narrowing distally but less narrowing that is seen of the main pancreatic duct which shows a transition in the pancreatic head above the suspected mass in the pancreas, see below. Intrahepatic biliary ducts are moderately dilated again with changes of cholecystectomy. Pancreas: Pancreatic mass centered in the uncinate abutting the SMV with distortion of the SMV, less than 180 degree involvement by MR but with limited assessment with respect to this portion of the evaluation due to motion  and susceptibility effects from adjacent bowel. Mass displays hypovascular features and measures 3.3 x 2.7 cm mainly in the uncinate process. Pancreatic duct is narrowed passing above this mass towards the ampulla and is dilated peripherally with cystic areas that are unchanged in the very short interval compared to the prior CT. Small adjacent lymph nodes are present in the small bowel mesentery and there is diffuse mesenteric edema and thickening which is nonspecific Spleen:  Normal. Adrenals/Urinary Tract:  Adrenal glands are normal. Symmetric renal enhancement without hydronephrosis. Stomach/Bowel: No acute gastrointestinal process to the extent evaluated on abdominal MRI. Vascular/Lymphatic: Scattered lymph nodes without pathologic enlargement but with some immediately adjacent to the masslike area in the head of the pancreas, within the small bowel mesentery. Other:  No ascites Musculoskeletal: No suspicious bone lesions identified. IMPRESSION: Motion limited assessment with hypoenhancing mass in the uncinate process/inferior head of the pancreas with pancreatic ductal narrowing/obstruction and early involvement of the distal common bile duct. 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 Endoscopic assessment may be helpful to establish diagnosis. Subtle hepatic lesion in the RIGHT hepatic lobe, enhancement characteristics cannot be assessed but this does raise the question of hepatic  involvement. Vascular involvement is difficult to assess on MR and on the previous CT, subsequent staging with dedicated pancreatic protocol may be helpful. There is at least abutment of the SMV. Hazy mesentery may reflect concomitant mesenteritis or lymphatic congestion. Small lymph nodes adjacent to the masslike area in the head of the pancreas warrant close attention on follow-up and or consideration for evaluation with PET depending on further workup. Electronically Signed   By: Zetta Bills M.D.   On:  03/23/2022 11:49   MR 3D Recon At Scanner  Result Date: 03/23/2022 CLINICAL DATA:  Suspected pancreatic mass in a patient with history of potential pancreatitis. EXAM: MRI ABDOMEN WITHOUT AND WITH CONTRAST (INCLUDING MRCP) TECHNIQUE: Multiplanar multisequence MR imaging of the abdomen was performed both before and after the administration of intravenous contrast. Heavily T2-weighted images of the biliary and pancreatic ducts were obtained, and three-dimensional MRCP images were rendered by post processing. CONTRAST:  20m GADAVIST GADOBUTROL 1 MMOL/ML IV SOLN COMPARISON:  CT evaluation of June 12 of 2023. FINDINGS: Lower chest: Incidental imaging of the lung bases is unremarkable. Limited assessment on MRI. Hepatobiliary: Subtle hepatic lesion in the RIGHT hepatic lobe, hepatic subsegment V/VI (image 23/5) also visible on diffusion-weighted imaging. Area not well assessed on post-contrast imaging due to motion related artifact. Common bile duct is less than a cm greatest caliber in the setting of prior cholecystectomy is mildly irregular. Some narrowing distally but less narrowing that is seen of the main pancreatic duct which shows a transition in the pancreatic head above the suspected mass in the pancreas, see below. Intrahepatic biliary ducts are moderately dilated again with changes of cholecystectomy. Pancreas: Pancreatic mass centered in the uncinate abutting the SMV with distortion of the SMV, less than 180 degree involvement by MR but with limited assessment with respect to this portion of the evaluation due to motion and susceptibility effects from adjacent bowel. Mass displays hypovascular features and measures 3.3 x 2.7 cm mainly in the uncinate process. Pancreatic duct is narrowed passing above this mass towards the ampulla and is dilated peripherally with cystic areas that are unchanged in the very short interval compared to the prior CT. Small adjacent lymph nodes are present in the small bowel  mesentery and there is diffuse mesenteric edema and thickening which is nonspecific Spleen:  Normal. Adrenals/Urinary Tract:  Adrenal glands are normal. Symmetric renal enhancement without hydronephrosis. Stomach/Bowel: No acute gastrointestinal process to the extent evaluated on abdominal MRI. Vascular/Lymphatic: Scattered lymph nodes without pathologic enlargement but with some immediately adjacent to the masslike area in the head of the pancreas, within the small bowel mesentery. Other:  No ascites Musculoskeletal: No suspicious bone lesions identified. IMPRESSION: Motion limited assessment with hypoenhancing mass in the uncinate process/inferior head of the pancreas with pancreatic ductal narrowing/obstruction and early involvement of the distal common bile duct. 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 Endoscopic assessment may be helpful to establish diagnosis. Subtle hepatic lesion in the RIGHT hepatic lobe, enhancement characteristics cannot be assessed but this does raise the question of hepatic involvement. Vascular involvement is difficult to assess on MR and on the previous CT, subsequent staging with dedicated pancreatic protocol may be helpful. There is at least abutment of the SMV. Hazy mesentery may reflect concomitant mesenteritis or lymphatic congestion. Small lymph nodes adjacent to the masslike area in the head of the pancreas warrant close attention on follow-up and or consideration for evaluation with PET depending on further workup. Electronically Signed   By: GCay Schillings  Wile M.D.   On: 03/23/2022 11:49   CT Abdomen Pelvis W Contrast  Result Date: 03/22/2022 CLINICAL DATA:  Acute abdominal pain.  An intentional weight loss. EXAM: CT ABDOMEN AND PELVIS WITH CONTRAST TECHNIQUE: Multidetector CT imaging of the abdomen and pelvis was performed using the standard protocol following bolus administration of intravenous contrast. RADIATION DOSE REDUCTION: This exam was performed  according to the departmental dose-optimization program which includes automated exposure control, adjustment of the mA and/or kV according to patient size and/or use of iterative reconstruction technique. CONTRAST:  162m OMNIPAQUE IOHEXOL 300 MG/ML  SOLN COMPARISON:  None Available. FINDINGS: Lower chest: The visualized lung bases are clear. No intra-abdominal free air.  Small free fluid in the pelvis. Hepatobiliary: Fatty liver. Faint hypodense focus in the inferior right lobe of the liver (27/2 and 72/4) is too small to characterize. No intrahepatic biliary dilatation. Cholecystectomy. No retained calcified stone noted in the central CBD. Pancreas: There is an ill-defined 3.6 x 2.5 cm hypodense mass in the uncinate process of the pancreas. There is mild atrophy of the pancreas with mild dilatation of the main pancreatic duct. There is inflammatory changes and stranding of the mesentery inferior to the pancreas. 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 There may be superimposed pancreatitis. Correlation with pancreatic enzymes recommended. Further evaluation of the pancreatic lesion with pancreatic mass protocol MRI on a nonemergent/outpatient basis recommended. 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 No drainable fluid collection/abscess or pseudocyst. Spleen: Normal in size without focal abnormality. Adrenals/Urinary Tract: The adrenal glands are unremarkable. There is no hydronephrosis on either side. There is symmetric enhancement and excretion of contrast by both kidneys. The visualized ureters and urinary bladder appear unremarkable. Stomach/Bowel: There is no bowel obstruction or active inflammation. The appendix is normal. Vascular/Lymphatic: Mild aortoiliac atherosclerotic disease. The IVC is unremarkable. No portal venous gas. Small  scattered mesenteric lymph nodes. No adenopathy. Reproductive: Hysterectomy.  No adnexal masses. Other: Ventral hernia repair mesh. Small fat containing ventral hernia noted. There is abutment of several loops of small bowel to the anterior hernia repair mesh consistent with adhesions. Musculoskeletal: Degenerative changes of the spine. No acute osseous pathology. IMPRESSION: 1. Findings most concerning for pancreatic malignancy arising in the uncinate process of the pancreas with possible associated acute pancreatitis. Correlation with pancreatic enzymes and further evaluation of the pancreatic mass with MRI on a nonemergent/outpatient basis recommended. 2. Fatty liver. 3. No bowel obstruction. Normal appendix. 4. Aortic Atherosclerosis (ICD10-I70.0). Electronically Signed   By: AAnner CreteM.D.   On: 03/22/2022 21:54    Labs:  CBC: Recent Labs    03/22/22 1754 03/23/22 0340 03/24/22 1215 04/06/22 1345  WBC 6.1 6.3 5.1 6.3  HGB 12.4 11.7* 12.4 12.1  HCT 38.6 35.5* 39.5 37.7  PLT 167 148* 167 170    COAGS: No results for input(s): "INR", "APTT" in the last 8760 hours.  BMP: Recent Labs    03/22/22 1754 03/23/22 0340 03/24/22 1215 04/06/22 1345  NA 135 138 136 138  K 3.2* 3.0* 3.1* 3.8  CL 102 106 101 98  CO2 _0 GLUCOSE 356* 245* 239* 329*  BUN 9 7* 7* 9  CALCIUM 9.0 8.8* 9.0 9.9  CREATININE 0.71 0.58 0.69 0.79  GFRNONAA >60 >60 >60 >60    LIVER FUNCTION TESTS: Recent Labs    03/22/22 1754  03/23/22 0340 04/06/22 1345  BILITOT 0.9 0.8 0.5  AST _0 ALT _1 ALKPHOS 81 78 76  PROT 7.7 6.8 7.6  ALBUMIN 3.5 3.1* 3.6    TUMOR MARKERS: No results for input(s): "AFPTM", "CEA", "CA199", "CHROMGRNA" in the last 8760 hours.  Assessment and Plan: 81 y.o. female with PMH sig for HTN,DM, and newly diagnosed pancreatic cancer. She presents today for port a cath placement to assist with treatment. Risks and benefits of image guided port-a-catheter  placement was discussed with the patient including, but not limited to bleeding, infection, pneumothorax, or fibrin sheath development and need for additional procedures.  All of the patient's questions were answered, patient is agreeable to proceed. Consent signed and in chart.  Pt's CBG level today 315; she took normal lantus dose this am; d/w ordering team and they will reach out to pt's primary provider to f/u on  Thank you for this interesting consult.  I greatly enjoyed meeting Sarah Davies and look forward to participating in their care.  A copy of this report was sent to the requesting provider on this date.  Electronically Signed: D. Rowe Robert, PA-C 04/20/2022, 12:35 PM   I spent a total of 25 minutes  in face to face in clinical consultation, greater than 50% of which was counseling/coordinating care for port a cath placement

## 2022-04-25 ENCOUNTER — Other Ambulatory Visit: Payer: Self-pay | Admitting: Oncology

## 2022-04-26 ENCOUNTER — Encounter: Payer: Self-pay | Admitting: *Deleted

## 2022-04-26 NOTE — Progress Notes (Signed)
Per Dr. Benay Spice: Unless Sarah Davies has had a change in her condition, OK to treat on 04/27/22 with Gemzar/Abraxane based on labs collected on 04/06/22.

## 2022-04-27 ENCOUNTER — Inpatient Hospital Stay: Payer: Medicare PPO

## 2022-04-27 VITALS — BP 153/69 | HR 69 | Temp 98.2°F | Resp 20 | Ht 66.0 in | Wt 233.8 lb

## 2022-04-27 DIAGNOSIS — Z5111 Encounter for antineoplastic chemotherapy: Secondary | ICD-10-CM | POA: Diagnosis not present

## 2022-04-27 DIAGNOSIS — C250 Malignant neoplasm of head of pancreas: Secondary | ICD-10-CM

## 2022-04-27 MED ORDER — SODIUM CHLORIDE 0.9% FLUSH
10.0000 mL | INTRAVENOUS | Status: DC | PRN
  Administered 2022-04-27: 10 mL

## 2022-04-27 MED ORDER — SODIUM CHLORIDE 0.9 % IV SOLN
1000.0000 mg/m2 | Freq: Once | INTRAVENOUS | Status: AC
  Administered 2022-04-27: 2242 mg via INTRAVENOUS
  Filled 2022-04-27: qty 52.6

## 2022-04-27 MED ORDER — PACLITAXEL PROTEIN-BOUND CHEMO INJECTION 100 MG
100.0000 mg/m2 | Freq: Once | INTRAVENOUS | Status: AC
  Administered 2022-04-27: 225 mg via INTRAVENOUS
  Filled 2022-04-27: qty 45

## 2022-04-27 MED ORDER — PROCHLORPERAZINE MALEATE 10 MG PO TABS
10.0000 mg | ORAL_TABLET | Freq: Once | ORAL | Status: AC
  Administered 2022-04-27: 10 mg via ORAL
  Filled 2022-04-27: qty 1

## 2022-04-27 MED ORDER — HEPARIN SOD (PORK) LOCK FLUSH 100 UNIT/ML IV SOLN
500.0000 [IU] | Freq: Once | INTRAVENOUS | Status: AC | PRN
  Administered 2022-04-27: 500 [IU]

## 2022-04-27 MED ORDER — SODIUM CHLORIDE 0.9 % IV SOLN: Freq: Once | INTRAVENOUS | Status: AC

## 2022-04-27 NOTE — Patient Instructions (Signed)
[facility]   Discharge Instructions: Thank you for choosing [facility] to provide your oncology and hematology care.   If you have a lab appointment with the [facility], please go directly to the [facility] and check in at the registration area.   Wear comfortable clothing and clothing appropriate for easy access to any Portacath or PICC line.   We strive to give you quality time with your provider. You may need to reschedule your appointment if you arrive late (15 or more minutes).  Arriving late affects you and other patients whose appointments are after yours.  Also, if you miss three or more appointments without notifying the office, you may be dismissed from the clinic at the provider's discretion.      For prescription refill requests, have your pharmacy contact our office and allow 72 hours for refills to be completed.    Today you received the following chemotherapy and/or immunotherapy agents Abraxane, Gemzar      To help prevent nausea and vomiting after your treatment, we encourage you to take your nausea medication as directed.  BELOW ARE SYMPTOMS THAT SHOULD BE REPORTED IMMEDIATELY: *FEVER GREATER THAN 100.4 F (38 C) OR HIGHER *CHILLS OR SWEATING *NAUSEA AND VOMITING THAT IS NOT CONTROLLED WITH YOUR NAUSEA MEDICATION *UNUSUAL SHORTNESS OF BREATH *UNUSUAL BRUISING OR BLEEDING *URINARY PROBLEMS (pain or burning when urinating, or frequent urination) *BOWEL PROBLEMS (unusual diarrhea, constipation, pain near the anus) TENDERNESS IN MOUTH AND THROAT WITH OR WITHOUT PRESENCE OF ULCERS (sore throat, sores in mouth, or a toothache) UNUSUAL RASH, SWELLING OR PAIN  UNUSUAL VAGINAL DISCHARGE OR ITCHING   Items with * indicate a potential emergency and should be followed up as soon as possible or go to the Emergency Department if any problems should occur.  Please show the CHEMOTHERAPY ALERT CARD or IMMUNOTHERAPY ALERT CARD at  check-in to the Emergency Department and triage nurse.  Should you have questions after your visit or need to cancel or reschedule your appointment, please contact [facility]  Dept: [phone]  and follow the prompts.  Office hours are 8:00 a.m. to 4:30 p.m. Monday - Friday. Please note that voicemails left after 4:00 p.m. may not be returned until the following business day.  We are closed weekends and major holidays. You have access to a nurse at all times for urgent questions. Please call the main number to the clinic Dept: [phone] and follow the prompts.   For any non-urgent questions, you may also contact your provider using MyChart. We now offer e-Visits for anyone 71 and older to request care online for non-urgent symptoms. For details visit [email or web address].   Also download the MyChart app! Go to the app store, search "MyChart", open the app, select [facility], and log in with your MyChart username and password.  Masks are optional in the cancer centers. If you would like for your care team to wear a mask while they are taking care of you, please let them know. For doctor visits, patients may have with them one support person who is at least 80 years old. At this time, visitors are not allowed in the infusion area.  Nanoparticle Albumin-Bound Paclitaxel injection What is this medication? NANOPARTICLE ALBUMIN-BOUND PACLITAXEL (Na no PAHR ti kuhl  al BYOO muhn-bound  PAK li TAX el) is a chemotherapy drug. It targets fast dividing cells, like cancer cells, and causes these cells to die. This medicine is used to  treat advanced breast cancer, lung cancer, and pancreatic cancer. This medicine may be used for other purposes; ask your health care provider or pharmacist if you have questions. COMMON BRAND NAME(S): Abraxane What should I tell my care team before I take this medication? They need to know if you have any of these conditions: kidney  disease liver disease low blood counts, like low white cell, platelet, or red cell counts lung or breathing disease, like asthma tingling of the fingers or toes, or other nerve disorder an unusual or allergic reaction to paclitaxel, albumin, other chemotherapy, other medicines, foods, dyes, or preservatives pregnant or trying to get pregnant breast-feeding How should I use this medication? This drug is given as an infusion into a vein. It is administered in a hospital or clinic by a specially trained health care professional. Talk to your pediatrician regarding the use of this medicine in children. Special care may be needed. Overdosage: If you think you have taken too much of this medicine contact a poison control center or emergency room at once. NOTE: This medicine is only for you. Do not share this medicine with others. What if I miss a dose? It is important not to miss your dose. Call your doctor or health care professional if you are unable to keep an appointment. What may interact with this medication? This medicine may interact with the following medications: antiviral medicines for hepatitis, HIV or AIDS certain antibiotics like erythromycin and clarithromycin certain medicines for fungal infections like ketoconazole and itraconazole certain medicines for seizures like carbamazepine, phenobarbital, phenytoin gemfibrozil nefazodone rifampin St. John's wort This list may not describe all possible interactions. Give your health care provider a list of all the medicines, herbs, non-prescription drugs, or dietary supplements you use. Also tell them if you smoke, drink alcohol, or use illegal drugs. Some items may interact with your medicine. What should I watch for while using this medication? Your condition will be monitored carefully while you are receiving this medicine. You will need important blood work done while you are taking this medicine. This medicine can cause serious  allergic reactions. If you experience allergic reactions like skin rash, itching or hives, swelling of the face, lips, or tongue, tell your doctor or health care professional right away. In some cases, you may be given additional medicines to help with side effects. Follow all directions for their use. This drug may make you feel generally unwell. This is not uncommon, as chemotherapy can affect healthy cells as well as cancer cells. Report any side effects. Continue your course of treatment even though you feel ill unless your doctor tells you to stop. Call your doctor or health care professional for advice if you get a fever, chills or sore throat, or other symptoms of a cold or flu. Do not treat yourself. This drug decreases your body's ability to fight infections. Try to avoid being around people who are sick. This medicine may increase your risk to bruise or bleed. Call your doctor or health care professional if you notice any unusual bleeding. Be careful brushing and flossing your teeth or using a toothpick because you may get an infection or bleed more easily. If you have any dental work done, tell your dentist you are receiving this medicine. Avoid taking products that contain aspirin, acetaminophen, ibuprofen, naproxen, or ketoprofen unless instructed by your doctor. These medicines may hide a fever. Do not become pregnant while taking this medicine or for 6 months after stopping it. Women should inform  their doctor if they wish to become pregnant or think they might be pregnant. Men should not father a child while taking this medicine or for 3 months after stopping it. There is a potential for serious side effects to an unborn child. Talk to your health care professional or pharmacist for more information. Do not breast-feed an infant while taking this medicine or for 2 weeks after stopping it. This medicine may interfere with the ability to get pregnant or to father a child. You should talk to  your doctor or health care professional if you are concerned about your fertility. What side effects may I notice from receiving this medication? Side effects that you should report to your doctor or health care professional as soon as possible: allergic reactions like skin rash, itching or hives, swelling of the face, lips, or tongue breathing problems changes in vision fast, irregular heartbeat low blood pressure mouth sores pain, tingling, numbness in the hands or feet signs of decreased platelets or bleeding - bruising, pinpoint red spots on the skin, black, tarry stools, blood in the urine signs of decreased red blood cells - unusually weak or tired, feeling faint or lightheaded, falls signs of infection - fever or chills, cough, sore throat, pain or difficulty passing urine signs and symptoms of liver injury like dark yellow or brown urine; general ill feeling or flu-like symptoms; light-colored stools; loss of appetite; nausea; right upper belly pain; unusually weak or tired; yellowing of the eyes or skin swelling of the ankles, feet, hands unusually slow heartbeat Side effects that usually do not require medical attention (report to your doctor or health care professional if they continue or are bothersome): diarrhea hair loss loss of appetite nausea, vomiting tiredness This list may not describe all possible side effects. Call your doctor for medical advice about side effects. You may report side effects to FDA at 1-800-FDA-1088. Where should I keep my medication? This drug is given in a hospital or clinic and will not be stored at home. NOTE: This sheet is a summary. It may not cover all possible information. If you have questions about this medicine, talk to your doctor, pharmacist, or health care provider.  2023 Elsevier/Gold Standard (2017-05-31 00:00:00) Gemcitabine injection What is this medication? GEMCITABINE (jem SYE ta been) is a chemotherapy drug. This medicine is  used to treat many types of cancer like breast cancer, lung cancer, pancreatic cancer, and ovarian cancer. This medicine may be used for other purposes; ask your health care provider or pharmacist if you have questions. COMMON BRAND NAME(S): Gemzar, Infugem What should I tell my care team before I take this medication? They need to know if you have any of these conditions: blood disorders infection kidney disease liver disease lung or breathing disease, like asthma recent or ongoing radiation therapy an unusual or allergic reaction to gemcitabine, other chemotherapy, other medicines, foods, dyes, or preservatives pregnant or trying to get pregnant breast-feeding How should I use this medication? This drug is given as an infusion into a vein. It is administered in a hospital or clinic by a specially trained health care professional. Talk to your pediatrician regarding the use of this medicine in children. Special care may be needed. Overdosage: If you think you have taken too much of this medicine contact a poison control center or emergency room at once. NOTE: This medicine is only for you. Do not share this medicine with others. What if I miss a dose? It is important not to miss  your dose. Call your doctor or health care professional if you are unable to keep an appointment. What may interact with this medication? medicines to increase blood counts like filgrastim, pegfilgrastim, sargramostim some other chemotherapy drugs like cisplatin vaccines Talk to your doctor or health care professional before taking any of these medicines: acetaminophen aspirin ibuprofen ketoprofen naproxen This list may not describe all possible interactions. Give your health care provider a list of all the medicines, herbs, non-prescription drugs, or dietary supplements you use. Also tell them if you smoke, drink alcohol, or use illegal drugs. Some items may interact with your medicine. What should I watch  for while using this medication? Visit your doctor for checks on your progress. This drug may make you feel generally unwell. This is not uncommon, as chemotherapy can affect healthy cells as well as cancer cells. Report any side effects. Continue your course of treatment even though you feel ill unless your doctor tells you to stop. In some cases, you may be given additional medicines to help with side effects. Follow all directions for their use. Call your doctor or health care professional for advice if you get a fever, chills or sore throat, or other symptoms of a cold or flu. Do not treat yourself. This drug decreases your body's ability to fight infections. Try to avoid being around people who are sick. This medicine may increase your risk to bruise or bleed. Call your doctor or health care professional if you notice any unusual bleeding. Be careful brushing and flossing your teeth or using a toothpick because you may get an infection or bleed more easily. If you have any dental work done, tell your dentist you are receiving this medicine. Avoid taking products that contain aspirin, acetaminophen, ibuprofen, naproxen, or ketoprofen unless instructed by your doctor. These medicines may hide a fever. Do not become pregnant while taking this medicine or for 6 months after stopping it. Women should inform their doctor if they wish to become pregnant or think they might be pregnant. Men should not father a child while taking this medicine and for 3 months after stopping it. There is a potential for serious side effects to an unborn child. Talk to your health care professional or pharmacist for more information. Do not breast-feed an infant while taking this medicine or for at least 1 week after stopping it. Men should inform their doctors if they wish to father a child. This medicine may lower sperm counts. Talk with your doctor or health care professional if you are concerned about your fertility. What  side effects may I notice from receiving this medication? Side effects that you should report to your doctor or health care professional as soon as possible: allergic reactions like skin rash, itching or hives, swelling of the face, lips, or tongue breathing problems pain, redness, or irritation at site where injected signs and symptoms of a dangerous change in heartbeat or heart rhythm like chest pain; dizziness; fast or irregular heartbeat; palpitations; feeling faint or lightheaded, falls; breathing problems signs of decreased platelets or bleeding - bruising, pinpoint red spots on the skin, black, tarry stools, blood in the urine signs of decreased red blood cells - unusually weak or tired, feeling faint or lightheaded, falls signs of infection - fever or chills, cough, sore throat, pain or difficulty passing urine signs and symptoms of kidney injury like trouble passing urine or change in the amount of urine signs and symptoms of liver injury like dark yellow or brown urine;  general ill feeling or flu-like symptoms; light-colored stools; loss of appetite; nausea; right upper belly pain; unusually weak or tired; yellowing of the eyes or skin swelling of ankles, feet, hands Side effects that usually do not require medical attention (report to your doctor or health care professional if they continue or are bothersome): constipation diarrhea hair loss loss of appetite nausea rash vomiting This list may not describe all possible side effects. Call your doctor for medical advice about side effects. You may report side effects to FDA at 1-800-FDA-1088. Where should I keep my medication? This drug is given in a hospital or clinic and will not be stored at home. NOTE: This sheet is a summary. It may not cover all possible information. If you have questions about this medicine, talk to your doctor, pharmacist, or health care provider.  2023 Elsevier/Gold Standard (2017-12-21 00:00:00)

## 2022-04-27 NOTE — Progress Notes (Signed)
Pharmacist Chemotherapy Monitoring - Initial Assessment    Anticipated start date: 04/27/22   The following has been reviewed per standard work regarding the patient's treatment regimen: The patient's diagnosis, treatment plan and drug doses, and organ/hematologic function Lab orders and baseline tests specific to treatment regimen  The treatment plan start date, drug sequencing, and pre-medications Prior authorization status  Patient's documented medication list, including drug-drug interaction screen and prescriptions for anti-emetics and supportive care specific to the treatment regimen The drug concentrations, fluid compatibility, administration routes, and timing of the medications to be used The patient's access for treatment and lifetime cumulative dose history, if applicable  The patient's medication allergies and previous infusion related reactions, if applicable   Changes made to treatment plan:  N/A  Follow up needed:  N/A   Patrica Duel, RPH, 04/27/2022  9:09 AM

## 2022-04-28 ENCOUNTER — Non-Acute Institutional Stay: Admit: 2022-04-28 | Payer: Self-pay | Admitting: Internal Medicine

## 2022-04-28 ENCOUNTER — Encounter: Payer: Self-pay | Admitting: Nurse Practitioner

## 2022-04-28 ENCOUNTER — Telehealth: Payer: Self-pay | Admitting: Emergency Medicine

## 2022-04-28 ENCOUNTER — Telehealth: Payer: Self-pay | Admitting: Pharmacist

## 2022-04-28 ENCOUNTER — Ambulatory Visit: Payer: Medicare PPO | Admitting: Podiatry

## 2022-04-28 DIAGNOSIS — E11649 Type 2 diabetes mellitus with hypoglycemia without coma: Secondary | ICD-10-CM | POA: Insufficient documentation

## 2022-04-28 NOTE — Assessment & Plan Note (Signed)
-   Ambulatory referral to Endocrinology - CBC - Comprehensive metabolic panel - may increase lantus to 18 units daily    Follow up:  Follow up in 3 months or sooner if needed

## 2022-04-28 NOTE — Telephone Encounter (Signed)
24 Hour Callback 24 Hour callback post 1st time Abraxane and Gemzar infusion.  Pt noted to be warm took temp and was 102. This lasted less than 1 hour.  Pt has no N/V/D but does complain of decreased appetite. Is drinking and eating. Recommended to eat small, frequent meals.  Reminded patient to call with any concerns or questions.

## 2022-04-28 NOTE — Telephone Encounter (Signed)
Received message from [facility] asking that I work with this patient for management of DM.   Contacted patient. Scheduled call for tomorrow.   Catie Hedwig Morton, PharmD, [facility] [phone]

## 2022-04-29 ENCOUNTER — Telehealth: Payer: Self-pay | Admitting: Genetic Counselor

## 2022-04-29 ENCOUNTER — Telehealth: Payer: Self-pay | Admitting: *Deleted

## 2022-04-29 ENCOUNTER — Other Ambulatory Visit: Payer: Self-pay | Admitting: Pharmacist

## 2022-04-29 ENCOUNTER — Encounter: Payer: Self-pay | Admitting: *Deleted

## 2022-04-29 ENCOUNTER — Encounter: Payer: Self-pay | Admitting: Genetic Counselor

## 2022-04-29 DIAGNOSIS — Z1379 Encounter for other screening for genetic and chromosomal anomalies: Secondary | ICD-10-CM | POA: Insufficient documentation

## 2022-04-29 MED ORDER — INSULIN GLARGINE 100 UNIT/ML SOLOSTAR PEN: 22.0000 [IU] | PEN_INJECTOR | Freq: Every day | SUBCUTANEOUS | 11 refills | Status: DC

## 2022-04-29 MED ORDER — FREESTYLE LIBRE 2 READER DEVI: 0 refills | Status: DC

## 2022-04-29 MED ORDER — FREESTYLE LIBRE 2 SENSOR MISC: 3 refills | Status: DC

## 2022-04-29 NOTE — Telephone Encounter (Signed)
I contacted Ms. Grima to discuss her genetic testing results. No pathogenic variants were identified in the 77 genes analyzed. Detailed clinic note to follow.  The test report has been scanned into EPIC and is located under the Molecular Pathology section of the Results Review tab.  A portion of the result report is included below for reference.   Lucille Passy, MS, [facility] Genetic Counselor [email or web address] [phone]) [phone]

## 2022-04-29 NOTE — Patient Instructions (Signed)
[name] and [name],   It was great talking with you both today!  We're sending an order for the [employer] 2 continuous glucose monitoring system to a company called [employer]. They should call you to confirm your insurance and information, and let you know if it would be covered by your insurance and what the copay would be.   Increase Lantus to 22 units daily. Continue glipizide XL 7.5 mg every morning.   Check your blood sugars twice daily:  1) Fasting, first thing in the morning before breakfast and  2) 2 hours after your largest meal.   Write down these readings and we will discuss at our call next week.   Thanks!  Catie Hedwig Morton, PharmD, [facility] [phone]

## 2022-04-29 NOTE — Chronic Care Management (AMB) (Signed)
Chief Complaint  Patient presents with   Diabetes    Sarah Davies is a 81 y.o. year old female who presented for a telephone visit.   They were referred to the pharmacist by their PCP for assistance in managing diabetes.    Subjective:  Care Team: Primary Care Provider: Fenton Foy, NP ; Next Scheduled Visit: not scheduled   Medication Access/Adherence  Current Pharmacy:  [id number] [place], [place] [id number]: [phone]: [phone]  Patient reports affordability concerns with their medications: No  Patient reports access/transportation concerns to their pharmacy: No  Patient reports adherence concerns with their medications:  No    Does report being overwhelmed by the number of medications she is taking. Discussed importance of management of hypertension and hyperglycemia.    Diabetes:  Current medications: glipizide XL 5 mg + 2.5 mg QAM, Lantus 18 units daily  Current glucose readings: twice daily: fasting, varies.  - Fastings: 300s - Later in the day 400-500   Patient reports hypoglycemic s/sx including dizziness, shakiness, sweating occasionally previously, but no discernable patterns of when. Did not check glucose at those times.   Hypertension:  Current medications: amlodipine 10 mg QAM, carvedilol 50 mg twice daily, doxazosin 4 mg QPM, furosemide 20 mg QAM, lisinopril 20 mg daily QAM  Patient has a validated, automated, upper arm home BP cuff Current blood pressure readings readings: not checking at home right now   Health Maintenance  Health Maintenance Due  Topic Date Due   OPHTHALMOLOGY EXAM  Never done   Hepatitis C Screening  Never done   TETANUS/TDAP  Never done   Zoster Vaccines- Shingrix (1 of 2) Never done   DEXA SCAN  Never done   Pneumonia Vaccine 80 Years old (2 - PPSV23 or PCV20) 07/27/2017   COVID-19 Vaccine (3 - Pfizer risk series) 01/30/2020      Objective: Lab Results  Component Value Date   HGBA1C 13.0 (H) 03/22/2022    Lab Results  Component Value Date   CREATININE 0.79 04/06/2022   BUN 9 04/06/2022   NA 138 04/06/2022   K 3.8 04/06/2022   CL 98 04/06/2022   CO2 30 04/06/2022    No results found for: "CHOL", "HDL", "LDLCALC", "LDLDIRECT", "TRIG", "CHOLHDL"  Medications Reviewed Today     Reviewed by NFenton Foy NP (Nurse Practitioner) on 04/28/22 at 0857  Med List Status: <None>   Medication Order Taking? Sig Documenting Provider Last Dose Status Informant  amLODipine (NORVASC) 10 MG tablet [id number] Take 1 tablet (10 mg total) by mouth daily. NFenton Foy NP  Active   b complex vitamins tablet [id number] Take 1 tablet by mouth daily. [provider] Taking Active Self  blood glucose meter kit and supplies [id number] Dispense based on patient and insurance preference. Use up to four times daily as directed. (FOR ICD-10 E10.9, E11.9). Regalado, Belkys A, MD Taking Active   calcium carbonate (OS-CAL) 600 MG TABS [id number] Take 600 mg by mouth 2 (two) times daily with a meal. [provider] Taking Active Self  carvedilol (COREG) 25 MG tablet [id number] Take 50 mg by mouth 2 (two) times daily with a meal. [provider] Taking Active Self  cholecalciferol (VITAMIN D) 1000 UNITS tablet [id number] Take 1,000 Units by mouth daily. [provider] Taking Active Self  doxazosin (CARDURA) 4 MG tablet [id number] Take 4 mg by  mouth at bedtime.  [provider] Taking Active Self  furosemide (LASIX) 20 MG tablet [id number] Yes Take 20 mg by mouth daily.  [provider] Taking Active Self  glipiZIDE (GLUCOTROL XL) 5 MG 24 hr tablet [id number] Yes Take 7.5 mg by mouth daily. [provider] Taking Active   insulin glargine (LANTUS) 100 UNIT/ML Solostar Pen [id number]  Inject 18 Units into the skin daily. Fenton Foy, NP  Active   Insulin Pen  Needle (PEN NEEDLES 3/16") 31G X 5 MM MISC [id number] Yes 1 Application by Does not apply route daily. Regalado, Cassie Freer, MD Taking Active   lidocaine-prilocaine (EMLA) cream [id number]  Apply 1 Application topically as needed. Ladell Pier, MD  Active   lipase/protease/amylase (CREON) 36000 UNITS CPEP capsule [id number] Yes Take 1 capsule (36,000 Units total) by mouth 3 (three) times daily before meals. Regalado, Belkys A, MD Taking Active   lisinopril (ZESTRIL) 20 MG tablet [id number] Yes Take 20 mg by mouth daily. [provider] Taking Active Self  magnesium 30 MG tablet [id number] Yes Take 30 mg by mouth daily. [provider] Taking Active Self  ondansetron (ZOFRAN) 4 MG tablet [id number] Yes Take 1 tablet (4 mg total) by mouth every 6 (six) hours as needed for nausea. Regalado, Belkys A, MD Taking Active   potassium chloride SA (KLOR-CON M) 20 MEQ tablet [id number] Yes Take 2 tablets (40 mEq total) by mouth daily for 3 days. Regalado, Belkys A, MD Taking Expired 04/19/22 2359   prochlorperazine (COMPAZINE) 10 MG tablet [id number]  Take 1 tablet (10 mg total) by mouth every 6 (six) hours as needed for nausea or vomiting. 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 May need to transition glipizide to bolus insulin moving forward. To aid in management, recommend pursuing coverage of CGM. Discussed with patient - she is interested in pursuing insurance coverage. Discussed with PCP. Order for [place] [facility] system submitted on [employer] to [employer] [facility] - Reviewed long term cardiovascular and renal outcomes of uncontrolled blood sugar - Reviewed goal A1c, goal fasting, and goal 2 hour post prandial glucos - Recommend to increase Lantus to 22 units daily. Continue glipizide XL 7.5 mg QAM for now.  - Recommend to check glucose at least twice daily,  hopefully will be able to utilize CGM.   Hypertension: - Currently uncontrolled but more relaxed control appropriate given age and comorbidities.  - Reviewed long term cardiovascular and renal outcomes of uncontrolled blood pressure - Reviewed appropriate blood pressure monitoring technique and reviewed goal blood pressure. Recommended to check home blood pressure and heart rate periodically - Recommend to continue current regimen at this time  Follow Up Plan: phone call in 1 week  Catie TJodi Mourning, PharmD, [place] [phone]

## 2022-04-29 NOTE — Progress Notes (Signed)
PATIENT NAVIGATOR PROGRESS NOTE  Name: Sarah Davies Date: 04/29/2022 MRN: [id number]  DOB: 1941/09/01   Reason for visit:  F/U after chemo  Comments:  Spoke with [name] after first chemo she is experiencing body aches and temperature of 100.5.  Discussed that these could be side effects asssociated with Gemcitabine  Instructed her to take extra strength Tylenol 2 tabs every 6 hours for bone aches Instructed her to take temperature before each dose of Tylenol so as not to mask a fever Instructed her to push oral fluids (at least 64 oz/day) To eat small amounts every few hours  To call in am and discuss changes needed in management of symptoms    Time spent counseling/coordinating care: 45-60 minutes

## 2022-04-30 ENCOUNTER — Telehealth: Payer: Self-pay | Admitting: *Deleted

## 2022-04-30 ENCOUNTER — Other Ambulatory Visit: Payer: Self-pay | Admitting: *Deleted

## 2022-04-30 ENCOUNTER — Inpatient Hospital Stay: Payer: Medicare PPO

## 2022-04-30 ENCOUNTER — Inpatient Hospital Stay: Payer: Medicare PPO | Admitting: Family

## 2022-04-30 DIAGNOSIS — Z5111 Encounter for antineoplastic chemotherapy: Secondary | ICD-10-CM | POA: Diagnosis not present

## 2022-04-30 DIAGNOSIS — C250 Malignant neoplasm of head of pancreas: Secondary | ICD-10-CM

## 2022-04-30 MED ORDER — SODIUM CHLORIDE 0.9% FLUSH
10.0000 mL | Freq: Once | INTRAVENOUS | Status: AC
  Administered 2022-04-30: 10 mL via INTRAVENOUS

## 2022-04-30 MED ORDER — HEPARIN SOD (PORK) LOCK FLUSH 100 UNIT/ML IV SOLN
500.0000 [IU] | Freq: Once | INTRAVENOUS | Status: AC
  Administered 2022-04-30: 500 [IU] via INTRAVENOUS

## 2022-04-30 MED ORDER — SODIUM CHLORIDE 0.9 % IV SOLN: INTRAVENOUS | Status: AC

## 2022-04-30 NOTE — Telephone Encounter (Signed)
Called patient to f/u on status since call w/navigator yesterday. Talked with daughter, who reports still having significant joint/muscle aches (using her husband's walker). Have not checked her temp since yesterday. She only eats bites and trying to drink fluids, having < 8 cups since yesterday afternoon. Patient said she feels dehydrated and would like to see if IVF will help her. Scheduled for 1145/1200 today for liter fluids.

## 2022-04-30 NOTE — Patient Instructions (Signed)
Rehydration, Adult Rehydration is the replacement of body fluids, salts, and minerals (electrolytes) that are lost during dehydration. Dehydration is when there is not enough water or other fluids in the body. This happens when you lose more fluids than you take in. Common causes of dehydration include: Not drinking enough fluids. This can occur when you are ill or doing activities that require a lot of energy, especially in hot weather. Conditions that cause loss of water or other fluids, such as diarrhea, vomiting, sweating, or urinating a lot. Other illnesses, such as fever or infection. Certain medicines, such as those that remove excess fluid from the body (diuretics). Symptoms of mild or moderate dehydration may include thirst, dry lips and mouth, and dizziness. Symptoms of severe dehydration may include increased heart rate, confusion, fainting, and not urinating. For severe dehydration, you may need to get fluids through an IV at the hospital. For mild or moderate dehydration, you can usually rehydrate at home by drinking certain fluids as told by your health care provider. What are the risks? Generally, rehydration is safe. However, taking in too much fluid (overhydration) can be a problem. This is rare. Overhydration can cause an electrolyte imbalance, kidney failure, or a decrease in salt (sodium) levels in the body. Supplies needed You will need an oral rehydration solution (ORS) if your health care provider tells you to use one. This is a drink to treat dehydration. It can be found in pharmacies and retail stores. How to rehydrate Fluids Follow instructions from your health care provider for rehydration. The kind of fluid and the amount you should drink depend on your condition. In general, you should choose drinks that you prefer. If told by your health care provider, drink an ORS. Make an ORS by following instructions on the package. Start by drinking small amounts, about  cup (120  mL) every 5-10 minutes. Slowly increase how much you drink until you have taken the amount recommended by your health care provider. Drink enough clear fluids to keep your urine pale yellow. If you were told to drink an ORS, finish it first, then start slowly drinking other clear fluids. Drink fluids such as: Water. This includes sparkling water and flavored water. Drinking only water can lead to having too little sodium in your body (hyponatremia). Follow the advice of your health care provider. Water from ice chips you suck on. Fruit juice with water you add to it (diluted). Sports drinks. Hot or cold herbal teas. Broth-based soups. Milk or milk products. Food Follow instructions from your health care provider about what to eat while you rehydrate. Your health care provider may recommend that you slowly begin eating regular foods in small amounts. Eat foods that contain a healthy balance of electrolytes, such as bananas, oranges, potatoes, tomatoes, and spinach. Avoid foods that are greasy or contain a lot of sugar. In some cases, you may get nutrition through a feeding tube that is passed through your nose and into your stomach (nasogastric tube, or NG tube). This may be done if you have uncontrolled vomiting or diarrhea. Beverages to avoid  Certain beverages may make dehydration worse. While you rehydrate, avoid drinking alcohol. How to tell if you are recovering from dehydration You may be recovering from dehydration if: You are urinating more often than before you started rehydrating. Your urine is pale yellow. Your energy level improves. You vomit less frequently. You have diarrhea less frequently. Your appetite improves or returns to normal. You feel less dizzy or less light-headed.   Your skin tone and color start to look more normal. Follow these instructions at home: Take over-the-counter and prescription medicines only as told by your health care provider. Do not take sodium  tablets. Doing this can lead to having too much sodium in your body (hypernatremia). Contact a health care provider if: You continue to have symptoms of mild or moderate dehydration, such as: Thirst. Dry lips. Slightly dry mouth. Dizziness. Dark urine or less urine than normal. Muscle cramps. You continue to vomit or have diarrhea. Get help right away if you: Have symptoms of dehydration that get worse. Have a fever. Have a severe headache. Have been vomiting and the following happens: Your vomiting gets worse or does not go away. Your vomit includes blood or green matter (bile). You cannot eat or drink without vomiting. Have problems with urination or bowel movements, such as: Diarrhea that gets worse or does not go away. Blood in your stool (feces). This may cause stool to look black and tarry. Not urinating, or urinating only a small amount of very dark urine, within 6-8 hours. Have trouble breathing. Have symptoms that get worse with treatment. These symptoms may represent a serious problem that is an emergency. Do not wait to see if the symptoms will go away. Get medical help right away. Call your local emergency services (911 in the [place] Do not drive yourself to the hospital. Summary Rehydration is the replacement of body fluids and minerals (electrolytes) that are lost during dehydration. Follow instructions from your health care provider for rehydration. The kind of fluid and amount you should drink depend on your condition. Slowly increase how much you drink until you have taken the amount recommended by your health care provider. Contact your health care provider if you continue to show signs of mild or moderate dehydration. This information is not intended to replace advice given to you by your health care provider. Make sure you discuss any questions you have with your health care provider. Document Revised: 11/28/2019 Document Reviewed: 10/08/2019 Elsevier Patient  Education  2023 [employer].  

## 2022-05-03 ENCOUNTER — Other Ambulatory Visit: Payer: Self-pay

## 2022-05-03 ENCOUNTER — Other Ambulatory Visit: Payer: Self-pay | Admitting: *Deleted

## 2022-05-03 ENCOUNTER — Inpatient Hospital Stay: Payer: Medicare PPO

## 2022-05-03 ENCOUNTER — Ambulatory Visit: Payer: Self-pay | Admitting: Genetic Counselor

## 2022-05-03 ENCOUNTER — Encounter: Payer: Self-pay | Admitting: *Deleted

## 2022-05-03 ENCOUNTER — Inpatient Hospital Stay (HOSPITAL_BASED_OUTPATIENT_CLINIC_OR_DEPARTMENT_OTHER): Payer: Medicare PPO | Admitting: Oncology

## 2022-05-03 VITALS — BP 126/64 | HR 75 | Temp 97.1°F | Resp 18 | Wt 231.2 lb

## 2022-05-03 VITALS — Wt 231.2 lb

## 2022-05-03 DIAGNOSIS — C250 Malignant neoplasm of head of pancreas: Secondary | ICD-10-CM

## 2022-05-03 DIAGNOSIS — Z95828 Presence of other vascular implants and grafts: Secondary | ICD-10-CM

## 2022-05-03 DIAGNOSIS — E876 Hypokalemia: Secondary | ICD-10-CM

## 2022-05-03 DIAGNOSIS — Z1379 Encounter for other screening for genetic and chromosomal anomalies: Secondary | ICD-10-CM

## 2022-05-03 DIAGNOSIS — Z5111 Encounter for antineoplastic chemotherapy: Secondary | ICD-10-CM | POA: Diagnosis not present

## 2022-05-03 DIAGNOSIS — E860 Dehydration: Secondary | ICD-10-CM

## 2022-05-03 LAB — CMP (CANCER CENTER ONLY)
ALT: 202 U/L — ABNORMAL HIGH (ref 0–44)
AST: 311 U/L (ref 15–41)
Albumin: 3.1 g/dL — ABNORMAL LOW (ref 3.5–5.0)
Alkaline Phosphatase: 335 U/L — ABNORMAL HIGH (ref 38–126)
Anion gap: 8 (ref 5–15)
BUN: 16 mg/dL (ref 8–23)
CO2: 31 mmol/L (ref 22–32)
Calcium: 9.4 mg/dL (ref 8.9–10.3)
Chloride: 99 mmol/L (ref 98–111)
Creatinine: 0.51 mg/dL (ref 0.44–1.00)
GFR, Estimated: >60 mL/min (ref >60)
Glucose, Bld: 275 mg/dL — ABNORMAL HIGH (ref 70–99)
Potassium: 3 mmol/L — ABNORMAL LOW (ref 3.5–5.1)
Sodium: 138 mmol/L (ref 135–145)
Total Bilirubin: 3.2 mg/dL — ABNORMAL HIGH (ref 0.3–1.2)
Total Protein: 6.7 g/dL (ref 6.5–8.1)

## 2022-05-03 MED ORDER — HEPARIN SOD (PORK) LOCK FLUSH 100 UNIT/ML IV SOLN
500.0000 [IU] | Freq: Once | INTRAVENOUS | Status: AC
  Administered 2022-05-03: 500 [IU] via INTRAVENOUS

## 2022-05-03 MED ORDER — POTASSIUM CHLORIDE CRYS ER 20 MEQ PO TBCR: 20.0000 meq | EXTENDED_RELEASE_TABLET | Freq: Every day | ORAL | 0 refills | Status: DC

## 2022-05-03 MED ORDER — FLUCONAZOLE 100 MG PO TABS: ORAL_TABLET | ORAL | 1 refills | Status: DC

## 2022-05-03 MED ORDER — SODIUM CHLORIDE 0.9 % IV SOLN: INTRAVENOUS | Status: AC

## 2022-05-03 MED ORDER — POTASSIUM CHLORIDE 10 MEQ/100ML IV SOLN
10.0000 meq | INTRAVENOUS | Status: AC
  Administered 2022-05-03 (×2): 10 meq via INTRAVENOUS
  Filled 2022-05-03: qty 100

## 2022-05-03 MED ORDER — SODIUM CHLORIDE 0.9 % IV SOLN: Freq: Once | INTRAVENOUS | Status: AC

## 2022-05-03 MED ORDER — SODIUM CHLORIDE 0.9% FLUSH
10.0000 mL | Freq: Once | INTRAVENOUS | Status: AC
  Administered 2022-05-03: 10 mL via INTRAVENOUS

## 2022-05-03 NOTE — Progress Notes (Signed)
CRITICAL VALUE STICKER  CRITICAL VALUE: AST 311  RECEIVER (on-site recipient of call):Arion Shankles,RN  DATE & TIME NOTIFIED: 05/03/22 @ 1244   MESSENGER (representative from lab):Simona Huh  MD NOTIFIED: Ned Card, NP  TIME OF NOTIFICATION: 1250  RESPONSE: Will review w/MD before patient leaves. Will give 20 meq KCL IV today for low K+ as well.

## 2022-05-03 NOTE — Patient Instructions (Addendum)
Rehydration, Adult Rehydration is the replacement of body fluids, salts, and minerals (electrolytes) that are lost during dehydration. Dehydration is when there is not enough water or other fluids in the body. This happens when you lose more fluids than you take in. Common causes of dehydration include: Not drinking enough fluids. This can occur when you are ill or doing activities that require a lot of energy, especially in hot weather. Conditions that cause loss of water or other fluids, such as diarrhea, vomiting, sweating, or urinating a lot. Other illnesses, such as fever or infection. Certain medicines, such as those that remove excess fluid from the body (diuretics). Symptoms of mild or moderate dehydration may include thirst, dry lips and mouth, and dizziness. Symptoms of severe dehydration may include increased heart rate, confusion, fainting, and not urinating. For severe dehydration, you may need to get fluids through an IV at the hospital. For mild or moderate dehydration, you can usually rehydrate at home by drinking certain fluids as told by your health care provider. What are the risks? Generally, rehydration is safe. However, taking in too much fluid (overhydration) can be a problem. This is rare. Overhydration can cause an electrolyte imbalance, kidney failure, or a decrease in salt (sodium) levels in the body. Supplies needed You will need an oral rehydration solution (ORS) if your health care provider tells you to use one. This is a drink to treat dehydration. It can be found in pharmacies and retail stores. How to rehydrate Fluids Follow instructions from your health care provider for rehydration. The kind of fluid and the amount you should drink depend on your condition. In general, you should choose drinks that you prefer. If told by your health care provider, drink an ORS. Make an ORS by following instructions on the package. Start by drinking small amounts, about  cup (120  mL) every 5-10 minutes. Slowly increase how much you drink until you have taken the amount recommended by your health care provider. Drink enough clear fluids to keep your urine pale yellow. If you were told to drink an ORS, finish it first, then start slowly drinking other clear fluids. Drink fluids such as: Water. This includes sparkling water and flavored water. Drinking only water can lead to having too little sodium in your body (hyponatremia). Follow the advice of your health care provider. Water from ice chips you suck on. Fruit juice with water you add to it (diluted). Sports drinks. Hot or cold herbal teas. Broth-based soups. Milk or milk products. Food Follow instructions from your health care provider about what to eat while you rehydrate. Your health care provider may recommend that you slowly begin eating regular foods in small amounts. Eat foods that contain a healthy balance of electrolytes, such as bananas, oranges, potatoes, tomatoes, and spinach. Avoid foods that are greasy or contain a lot of sugar. In some cases, you may get nutrition through a feeding tube that is passed through your nose and into your stomach (nasogastric tube, or NG tube). This may be done if you have uncontrolled vomiting or diarrhea. Beverages to avoid  Certain beverages may make dehydration worse. While you rehydrate, avoid drinking alcohol. How to tell if you are recovering from dehydration You may be recovering from dehydration if: You are urinating more often than before you started rehydrating. Your urine is pale yellow. Your energy level improves. You vomit less frequently. You have diarrhea less frequently. Your appetite improves or returns to normal. You feel less dizzy or less light-headed.   Your skin tone and color start to look more normal. Follow these instructions at home: Take over-the-counter and prescription medicines only as told by your health care provider. Do not take sodium  tablets. Doing this can lead to having too much sodium in your body (hypernatremia). Contact a health care provider if: You continue to have symptoms of mild or moderate dehydration, such as: Thirst. Dry lips. Slightly dry mouth. Dizziness. Dark urine or less urine than normal. Muscle cramps. You continue to vomit or have diarrhea. Get help right away if you: Have symptoms of dehydration that get worse. Have a fever. Have a severe headache. Have been vomiting and the following happens: Your vomiting gets worse or does not go away. Your vomit includes blood or green matter (bile). You cannot eat or drink without vomiting. Have problems with urination or bowel movements, such as: Diarrhea that gets worse or does not go away. Blood in your stool (feces). This may cause stool to look black and tarry. Not urinating, or urinating only a small amount of very dark urine, within 6-8 hours. Have trouble breathing. Have symptoms that get worse with treatment. These symptoms may represent a serious problem that is an emergency. Do not wait to see if the symptoms will go away. Get medical help right away. Call your local emergency services (911 in the [place] Do not drive yourself to the hospital. Summary Rehydration is the replacement of body fluids and minerals (electrolytes) that are lost during dehydration. Follow instructions from your health care provider for rehydration. The kind of fluid and amount you should drink depend on your condition. Slowly increase how much you drink until you have taken the amount recommended by your health care provider. Contact your health care provider if you continue to show signs of mild or moderate dehydration. This information is not intended to replace advice given to you by your health care provider. Make sure you discuss any questions you have with your health care provider. Document Revised: 11/28/2019 Document Reviewed: 10/08/2019 Elsevier Patient  Education  [place].  Hypokalemia Hypokalemia means that the amount of potassium in the blood is lower than normal. Potassium is a mineral (electrolyte) that helps regulate the amount of fluid in the body. It also stimulates muscle tightening (contraction) and helps nerves work properly. Normally, most of the body's potassium is inside cells, and only a very small amount is in the blood. Because the amount in the blood is so small, minor changes to potassium levels in the blood can be life-threatening. What are the causes? This condition may be caused by: Antibiotic medicine. Diarrhea or vomiting. Taking too much of a medicine that helps you have a bowel movement (laxative) can cause diarrhea and lead to hypokalemia. Chronic kidney disease (CKD). Medicines that help the body get rid of excess fluid (diuretics). Eating disorders, such as anorexia or bulimia. Low magnesium levels in the body. Sweating a lot. What are the signs or symptoms? Symptoms of this condition include: Weakness. Constipation. Fatigue. Muscle cramps. Mental confusion. Skipped heartbeats or irregular heartbeat (palpitations). Tingling or numbness. How is this diagnosed? This condition is diagnosed with a blood test. How is this treated? This condition may be treated by: Taking potassium supplements. Adjusting the medicines that you take. Eating more foods that contain a lot of potassium. If your potassium level is very low, you may need to get potassium through an IV and be monitored in the hospital. Follow these instructions at home: Eating and drinking  Eat a healthy diet. A healthy diet includes fresh fruits and vegetables, whole grains, healthy fats, and lean proteins. If told, eat more foods that contain a lot of potassium. These include: Nuts, such as peanuts and pistachios. Seeds, such as sunflower seeds and pumpkin seeds. Peas, lentils, and lima beans. Whole grain and bran cereals and  breads. Fresh fruits and vegetables, such as apricots, avocado, bananas, cantaloupe, kiwi, oranges, tomatoes, asparagus, and potatoes. Juices, such as orange, tomato, and prune. Lean meats, including fish. Milk and milk products, such as yogurt. General instructions Take over-the-counter and prescription medicines only as told by your health care provider. This includes vitamins, natural food products, and supplements. Keep all follow-up visits. This is important. Contact a health care provider if: You have weakness that gets worse. You feel your heart pounding or racing. You vomit. You have diarrhea. You have diabetes and you have trouble keeping your blood sugar in your target range. Get help right away if: You have chest pain. You have shortness of breath. You have vomiting or diarrhea that lasts for more than 2 days. You faint. These symptoms may be an emergency. Get help right away. Call 911. Do not wait to see if the symptoms will go away. Do not drive yourself to the hospital. Summary Hypokalemia means that the amount of potassium in the blood is lower than normal. This condition is diagnosed with a blood test. Hypokalemia may be treated by taking potassium supplements, adjusting the medicines that you take, or eating more foods that are high in potassium. If your potassium level is very low, you may need to get potassium through an IV and be monitored in the hospital. This information is not intended to replace advice given to you by your health care provider. Make sure you discuss any questions you have with your health care provider. Document Revised: 06/11/2021 Document Reviewed: 06/11/2021 Elsevier Patient Education  [place].

## 2022-05-03 NOTE — Progress Notes (Signed)
HPI:   Sarah Davies was previously seen in the [place] clinic due to a personal and family history of cancer and concerns regarding a hereditary predisposition to cancer. Please refer to our prior cancer genetics clinic note for more information regarding our discussion, assessment and recommendations, at the time. Sarah Davies recent genetic test results were disclosed to Sarah Davies, as were recommendations warranted by these results. These results and recommendations are discussed in more detail below.  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 Report date is 04/26/2022.  The CancerNext-Expanded gene panel offered by [facility] and includes sequencing, rearrangement, and RNA analysis for the following 77 genes: AIP, ALK, APC, ATM, AXIN2, BAP1, BARD1, BLM, BMPR1A, BRCA1, BRCA2, BRIP1, CDC73, CDH1, CDK4, CDKN1B, CDKN2A, CHEK2, CTNNA1, DICER1, FANCC, FH, FLCN, GALNT12, KIF1B, LZTR1, MAX, MEN1, MET, MLH1, MSH2, MSH3, MSH6, MUTYH, NBN, NF1, NF2, NTHL1, PALB2, PHOX2B, PMS2, POT1, PRKAR1A, PTCH1, PTEN, RAD51C, RAD51D, RB1, RECQL, RET, SDHA, SDHAF2, SDHB, SDHC, SDHD, SMAD4, SMARCA4, SMARCB1, SMARCE1, STK11, SUFU, TMEM127, TP53, TSC1, TSC2, VHL and XRCC2 (sequencing and deletion/duplication); EGFR, EGLN1, HOXB13, KIT, MITF, PDGFRA, POLD1, and POLE (sequencing only); EPCAM and GREM1 (deletion/duplication only).      FAMILY HISTORY:  We obtained a detailed, 4-generation family history.  Significant diagnoses are listed below:      Family History  Problem Relation  Age of Onset   Breast cancer Sister 14 - 73        metastatic   Throat cancer Sister     Cancer Maternal Grandmother          unknown type     Sarah Davies sister was diagnosed with breast cancer in Sarah Davies 20s, Sarah Davies died due to metastatic breast cancer. A second sister was diagnosed with throat cancer, Sarah Davies is deceased. Sarah Davies maternal grandmother was diagnosed with an unknown type of cancer at an unknown age, Sarah Davies is deceased. Sarah Davies is unaware of previous family history of genetic testing for hereditary cancer risks. There is no reported Ashkenazi Jewish ancestry.  GENETIC TEST RESULTS:  The [facility] CancerNext-Expanded Panel found no pathogenic mutations.   The CancerNext-Expanded gene panel offered by [facility] and includes sequencing, rearrangement, and RNA analysis for the following 77 genes: AIP, ALK, APC, ATM, AXIN2, BAP1, BARD1, BLM, BMPR1A, BRCA1, BRCA2, BRIP1, CDC73, CDH1, CDK4, CDKN1B, CDKN2A, CHEK2, CTNNA1, DICER1, FANCC, FH, FLCN, GALNT12, KIF1B, LZTR1, MAX, MEN1, MET, MLH1, MSH2, MSH3, MSH6, MUTYH, NBN, NF1, NF2, NTHL1, PALB2, PHOX2B, PMS2, POT1, PRKAR1A, PTCH1, PTEN, RAD51C, RAD51D, RB1, RECQL, RET, SDHA, SDHAF2, SDHB, SDHC, SDHD, SMAD4, SMARCA4, SMARCB1, SMARCE1, STK11, SUFU, TMEM127, TP53, TSC1, TSC2, VHL and XRCC2 (sequencing and deletion/duplication); EGFR, EGLN1, HOXB13, KIT, MITF, PDGFRA, POLD1, and POLE (sequencing only); EPCAM and GREM1 (deletion/duplication only).   The test report has been scanned into EPIC and is located under the Molecular Pathology section of the Results Review tab.  A portion of the Davies report is included below for reference. Genetic testing reported out on 04/26/2022.        Even though a pathogenic  variant was not identified, possible explanations for the cancer in the family may include: There may be no hereditary risk for cancer in the family. The cancers in Sarah Davies and/or Sarah Davies family may be due to other genetic or environmental factors. There  may be a gene mutation in one of these genes that current testing methods cannot detect, but that chance is small. There could be another gene that has not yet been discovered, or that we have not yet tested, that is responsible for the cancer diagnoses in the family.   Therefore, it is important to remain in touch with cancer genetics in the future so that we can continue to offer Sarah Davies the most up to date genetic testing.    ADDITIONAL GENETIC TESTING:  We discussed with Sarah Davies that Sarah Davies genetic testing was fairly extensive.  If there are genes identified to increase cancer risk that can be analyzed in the future, we would be happy to discuss and coordinate this testing at that time.    CANCER SCREENING RECOMMENDATIONS:  Sarah Davies is considered negative (normal).  This means that we have not identified a hereditary cause for Sarah Davies personal and family history of cancer at this time.    An individual's cancer risk and medical management are not determined by genetic test results alone. Overall cancer risk assessment incorporates additional factors, including personal medical history, family history, and any available genetic information that may Davies in a personalized plan for cancer prevention and surveillance. Therefore, it is recommended Sarah Davies continue to follow the cancer management and screening guidelines provided by Sarah Davies oncology and primary healthcare provider.  RECOMMENDATIONS FOR FAMILY MEMBERS:   Since Sarah Davies did not inherit a mutation in a cancer predisposition gene included on this panel, Sarah Davies children could not have inherited a mutation from Sarah Davies in one of these genes.  FOLLOW-UP:  Cancer genetics is a rapidly advancing field and it is possible that new genetic tests will be appropriate for Sarah Davies and/or Sarah Davies family members in the future. We encouraged Sarah Davies to remain in contact with cancer genetics on an annual basis so we can update Sarah Davies personal and family histories and let Sarah Davies  know of advances in cancer genetics that may benefit this family.   Our contact number was provided. Sarah Davies questions were answered to Sarah Davies satisfaction, and Sarah Davies knows Sarah Davies is welcome to call us at anytime with additional questions or concerns.   Sarah Passy, MS, [facility] Genetic Counselor [email or web address] (P) [phone]

## 2022-05-03 NOTE — Progress Notes (Signed)
PATIENT NAVIGATOR PROGRESS NOTE  Name: Sarah Davies Date: 05/03/2022 MRN: [id number]  DOB: [birth year]   Reason for visit: Returning call to patient  Comments:  Spoke with daughter Shelda Pal, patient is not eating and drinking due to sore throat and mouth sores.  She will come to clinic this am for fluids and evaluation    Time spent counseling/coordinating care: 15-30 minutes

## 2022-05-03 NOTE — Progress Notes (Signed)
[place] OFFICE PROGRESS NOTE   Diagnosis: Pancreas cancer  INTERVAL HISTORY:   Sarah Davies returns for an unscheduled visit.  She completed cycle 1 gemcitabine/Abraxane on 04/27/2022.  No nausea or vomiting.  She continues to have left-sided abdominal pain.  She takes Tylenol for pain.  She had a low-grade fever a few days following chemotherapy.  Diarrhea has improved with Creon.  She complains of a sore throat for the past several days.  She has been able to eat or drink very little secondary to the sore throat.  She received intravenous fluids 04/30/2022.  Objective:  Vital signs in last 24 hours:  There were no vitals taken for this visit.    HEENT: Thrush over the tongue, bilateral buccal mucosa, and pharynx Resp: Lungs clear bilaterally Cardio: Regular rate and rhythm GI: No hepatosplenomegaly Vascular: No leg edema  Portacath/PICC-without erythema  Lab Results:  Lab Results  Component Value Date   WBC 6.3 04/06/2022   HGB 12.1 04/06/2022   HCT 37.7 04/06/2022   MCV 85.5 04/06/2022   PLT 170 04/06/2022   NEUTROABS 4.0 04/06/2022    CMP  Lab Results  Component Value Date   NA 138 04/06/2022   K 3.8 04/06/2022   CL 98 04/06/2022   CO2 30 04/06/2022   GLUCOSE 329 (H) 04/06/2022   BUN 9 04/06/2022   CREATININE 0.79 04/06/2022   CALCIUM 9.9 04/06/2022   PROT 7.6 04/06/2022   ALBUMIN 3.6 04/06/2022   AST 27 04/06/2022   ALT 21 04/06/2022   ALKPHOS 76 04/06/2022   BILITOT 0.5 04/06/2022   GFRNONAA >60 04/06/2022    Lab Results  Component Value Date   CAN199 988 (H) 04/06/2022    Medications: I have reviewed the patient's current medications.   Assessment/Plan: Pancreas cancer 03/22/2022 CT abdomen/pelvis-ill-defined 3.6 x 2.5 cm hypodense mass in the uncinate process of the pancreas; possible associated acute pancreatitis; fatty liver; faint hypodense focus in the inferior right lobe of the liver too small to characterize; no  intrahepatic biliary dilatation.   03/23/2022 MRI-hypoenhancing mass in the uncinate process/inferior head of the pancreas abutting the SMV with distortion of the SMV, less than 180 degree involvement by MR but with limited assessment; pancreatic duct narrowing/obstruction and early involvement of the distal common bile duct; subtle hepatic lesion in the right hepatic lobe; hazy mesentery may reflect concomitant mesenteritis or lymphatic congestion; small lymph nodes adjacent to the masslike area in the head of the pancreas. 03/26/2022 upper EUS-mass identified in the pancreatic head and uncinate process, appearance consistent with adenocarcinoma.  FNA pancreas head showed malignant cells consistent with adenocarcinoma. 04/07/2022 CT abdomen/pelvis pancreatic protocol-adenocarcinoma within the pancreatic uncinate process, isolated right hepatic lobe lesion suspicious for metastatic disease, contact of the SMV by tumor over an approximately 180 degree span with no evidence of occlusion and no arterial involvement, nonspecific borderline enlarged portacaval lymph node, small bowel mesenteric nodes most likely related to mesenteric adenitis/panniculitis Cycle 1 gemcitabine/Abraxane 04/27/2022 Diabetes with recent poor control Diarrhea, typically after eating Diabetic neuropathy Hypertension Hyperlipidemia Leg edema-04/06/2022 bilateral lower extremities negative for DVT Severe oropharyngeal candidiasis 05/03/2022-Diflucan    Disposition: Sarah Davies is now at day 7 following cycle 1 gemcitabine/Abraxane.  She appears to have severe oropharyngeal candidiasis.  She received intravenous fluids today.  She will begin Diflucan.  She will return for intravenous fluids tomorrow.  Hopefully her symptoms will improve over the next few days.  She is scheduled for an office visit and cycle  2 chemotherapy 05/11/2022.  Sarah Coder, MD  05/03/2022  12:15 PM

## 2022-05-04 ENCOUNTER — Inpatient Hospital Stay: Payer: Medicare PPO

## 2022-05-04 ENCOUNTER — Other Ambulatory Visit: Payer: Self-pay | Admitting: Gastroenterology

## 2022-05-04 ENCOUNTER — Encounter: Payer: Self-pay | Admitting: *Deleted

## 2022-05-04 ENCOUNTER — Telehealth: Payer: Self-pay | Admitting: Nurse Practitioner

## 2022-05-04 VITALS — BP 132/67 | HR 76 | Temp 97.7°F | Resp 18

## 2022-05-04 DIAGNOSIS — C250 Malignant neoplasm of head of pancreas: Secondary | ICD-10-CM

## 2022-05-04 DIAGNOSIS — E860 Dehydration: Secondary | ICD-10-CM

## 2022-05-04 DIAGNOSIS — Z5111 Encounter for antineoplastic chemotherapy: Secondary | ICD-10-CM | POA: Diagnosis not present

## 2022-05-04 DIAGNOSIS — Z95828 Presence of other vascular implants and grafts: Secondary | ICD-10-CM

## 2022-05-04 LAB — CMP (CANCER CENTER ONLY)
ALT: 190 U/L — ABNORMAL HIGH (ref 0–44)
AST: 252 U/L (ref 15–41)
Albumin: 3.2 g/dL — ABNORMAL LOW (ref 3.5–5.0)
Alkaline Phosphatase: 349 U/L — ABNORMAL HIGH (ref 38–126)
Anion gap: 9 (ref 5–15)
BUN: 14 mg/dL (ref 8–23)
CO2: 29 mmol/L (ref 22–32)
Calcium: 9.4 mg/dL (ref 8.9–10.3)
Chloride: 97 mmol/L — ABNORMAL LOW (ref 98–111)
Creatinine: 0.49 mg/dL (ref 0.44–1.00)
GFR, Estimated: >60 mL/min (ref >60)
Glucose, Bld: 334 mg/dL — ABNORMAL HIGH (ref 70–99)
Potassium: 3.4 mmol/L — ABNORMAL LOW (ref 3.5–5.1)
Sodium: 135 mmol/L (ref 135–145)
Total Bilirubin: 2.6 mg/dL — ABNORMAL HIGH (ref 0.3–1.2)
Total Protein: 6.7 g/dL (ref 6.5–8.1)

## 2022-05-04 MED ORDER — SODIUM CHLORIDE 0.9 % IV SOLN: Freq: Once | INTRAVENOUS | Status: AC

## 2022-05-04 MED ORDER — HEPARIN SOD (PORK) LOCK FLUSH 100 UNIT/ML IV SOLN
500.0000 [IU] | Freq: Once | INTRAVENOUS | Status: AC
  Administered 2022-05-04: 500 [IU] via INTRAVENOUS

## 2022-05-04 MED ORDER — SODIUM CHLORIDE 0.9% FLUSH
10.0000 mL | Freq: Once | INTRAVENOUS | Status: AC
  Administered 2022-05-04: 10 mL via INTRAVENOUS

## 2022-05-04 NOTE — Patient Instructions (Addendum)
Diarrhea, Adult Diarrhea is when you pass loose and watery poop (stool) often. Diarrhea can make you feel weak and cause you to lose water in your body (get dehydrated). Losing water in your body can cause you to: Feel tired and thirsty. Have a dry mouth. Go pee (urinate) less often. Diarrhea often lasts 2-3 days. However, it can last longer if it is a sign of something more serious. It is important to treat your diarrhea as told by your doctor. Follow these instructions at home: Imodium AD: take 1-2 tablets four times daily as needed for loose stools Eating and drinking      Follow these instructions as told by your doctor: Take an ORS (oral rehydration solution). This is a drink that helps you replace fluids and minerals your body lost. It is sold at pharmacies and stores. Drink plenty of fluids, such as: Water. Ice chips. Diluted fruit juice. Low-calorie sports drinks. Milk, if you want. Avoid drinking fluids that have a lot of sugar or caffeine in them. Eat bland, easy-to-digest foods in small amounts as you are able. These foods include: Bananas. Applesauce. Rice. Low-fat (lean) meats. Toast. Crackers. Avoid alcohol. Avoid spicy or fatty foods.  Medicines Take over-the-counter and prescription medicines only as told by your doctor. If you were prescribed an antibiotic medicine, take it as told by your doctor. Do not stop using the antibiotic even if you start to feel better. General instructions  Wash your hands often using soap and water. If soap and water are not available, use a hand sanitizer. Others in your home should wash their hands as well. Hands should be washed: After using the toilet or changing a diaper. Before preparing, cooking, or serving food. While caring for a sick person. While visiting someone in a hospital. Drink enough fluid to keep your pee (urine) pale yellow. Rest at home while you get better. Take a warm bath to help with any burning or pain  from having diarrhea. Watch your condition for any changes. Keep all follow-up visits as told by your doctor. This is important. Contact a doctor if: You have a fever. Your diarrhea gets worse. You have new symptoms. You cannot keep fluids down. You feel light-headed or dizzy. You have a headache. You have muscle cramps. Get help right away if: You have chest pain. You feel very weak or you pass out (faint). You have bloody or black poop or poop that looks like tar. You have very bad pain, cramping, or bloating in your belly (abdomen). You have trouble breathing or you are breathing very quickly. Your heart is beating very quickly. Your skin feels cold and clammy. You feel confused. You have signs of losing too much water in your body, such as: Dark pee, very little pee, or no pee. Cracked lips. Dry mouth. Sunken eyes. Sleepiness. Weakness. Summary Diarrhea is when you pass loose and watery poop (stool) often. Diarrhea can make you feel weak and cause you to lose water in your body (get dehydrated). Take an ORS (oral rehydration solution). This is a drink that is sold at pharmacies and stores. Eat bland, easy-to-digest foods in small amounts as you are able. Contact a doctor if your condition gets worse. Get help right away if you have signs that you have lost too much water in your body. This information is not intended to replace advice given to you by your health care provider. Make sure you discuss any questions you have with your health care provider. Document  Revised: 04/08/2021 Document Reviewed: 04/08/2021 Elsevier Patient Education  [place]. Rehydration, Adult Rehydration is the replacement of body fluids, salts, and minerals (electrolytes) that are lost during dehydration. Dehydration is when there is not enough water or other fluids in the body. This happens when you lose more fluids than you take in. Common causes of dehydration include: Not drinking  enough fluids. This can occur when you are ill or doing activities that require a lot of energy, especially in hot weather. Conditions that cause loss of water or other fluids, such as diarrhea, vomiting, sweating, or urinating a lot. Other illnesses, such as fever or infection. Certain medicines, such as those that remove excess fluid from the body (diuretics). Symptoms of mild or moderate dehydration may include thirst, dry lips and mouth, and dizziness. Symptoms of severe dehydration may include increased heart rate, confusion, fainting, and not urinating. For severe dehydration, you may need to get fluids through an IV at the hospital. For mild or moderate dehydration, you can usually rehydrate at home by drinking certain fluids as told by your health care provider. What are the risks? Generally, rehydration is safe. However, taking in too much fluid (overhydration) can be a problem. This is rare. Overhydration can cause an electrolyte imbalance, kidney failure, or a decrease in salt (sodium) levels in the body. Supplies needed You will need an oral rehydration solution (ORS) if your health care provider tells you to use one. This is a drink to treat dehydration. It can be found in pharmacies and retail stores. How to rehydrate Fluids Follow instructions from your health care provider for rehydration. The kind of fluid and the amount you should drink depend on your condition. In general, you should choose drinks that you prefer. If told by your health care provider, drink an ORS. Make an ORS by following instructions on the package. Start by drinking small amounts, about  cup (120 mL) every 5-10 minutes. Slowly increase how much you drink until you have taken the amount recommended by your health care provider. Drink enough clear fluids to keep your urine pale yellow. If you were told to drink an ORS, finish it first, then start slowly drinking other clear fluids. Drink fluids such as: Water.  This includes sparkling water and flavored water. Drinking only water can lead to having too little sodium in your body (hyponatremia). Follow the advice of your health care provider. Water from ice chips you suck on. Fruit juice with water you add to it (diluted). Sports drinks. Hot or cold herbal teas. Broth-based soups. Milk or milk products. Food Follow instructions from your health care provider about what to eat while you rehydrate. Your health care provider may recommend that you slowly begin eating regular foods in small amounts. Eat foods that contain a healthy balance of electrolytes, such as bananas, oranges, potatoes, tomatoes, and spinach. Avoid foods that are greasy or contain a lot of sugar. In some cases, you may get nutrition through a feeding tube that is passed through your nose and into your stomach (nasogastric tube, or NG tube). This may be done if you have uncontrolled vomiting or diarrhea. Beverages to avoid  Certain beverages may make dehydration worse. While you rehydrate, avoid drinking alcohol. How to tell if you are recovering from dehydration You may be recovering from dehydration if: You are urinating more often than before you started rehydrating. Your urine is pale yellow. Your energy level improves. You vomit less frequently. You have diarrhea less frequently. Your  appetite improves or returns to normal. You feel less dizzy or less light-headed. Your skin tone and color start to look more normal. Follow these instructions at home: Take over-the-counter and prescription medicines only as told by your health care provider. Do not take sodium tablets. Doing this can lead to having too much sodium in your body (hypernatremia). Contact a health care provider if: You continue to have symptoms of mild or moderate dehydration, such as: Thirst. Dry lips. Slightly dry mouth. Dizziness. Dark urine or less urine than normal. Muscle cramps. You continue to  vomit or have diarrhea. Get help right away if you: Have symptoms of dehydration that get worse. Have a fever. Have a severe headache. Have been vomiting and the following happens: Your vomiting gets worse or does not go away. Your vomit includes blood or green matter (bile). You cannot eat or drink without vomiting. Have problems with urination or bowel movements, such as: Diarrhea that gets worse or does not go away. Blood in your stool (feces). This may cause stool to look black and tarry. Not urinating, or urinating only a small amount of very dark urine, within 6-8 hours. Have trouble breathing. Have symptoms that get worse with treatment. These symptoms may represent a serious problem that is an emergency. Do not wait to see if the symptoms will go away. Get medical help right away. Call your local emergency services (911 in the [place] Do not drive yourself to the hospital. Summary Rehydration is the replacement of body fluids and minerals (electrolytes) that are lost during dehydration. Follow instructions from your health care provider for rehydration. The kind of fluid and amount you should drink depend on your condition. Slowly increase how much you drink until you have taken the amount recommended by your health care provider. Contact your health care provider if you continue to show signs of mild or moderate dehydration. This information is not intended to replace advice given to you by your health care provider. Make sure you discuss any questions you have with your health care provider. Document Revised: 11/28/2019 Document Reviewed: 10/08/2019 Elsevier Patient Education  [place].

## 2022-05-04 NOTE — Progress Notes (Signed)
CRITICAL VALUE STICKER  CRITICAL VALUE: AST 252  RECEIVER (on-site recipient of call):Jassiel Flye,RN  DATE & TIME NOTIFIED: 05/04/22 @ 1142  MESSENGER (representative from lab):Simona Huh  MD NOTIFIED: Dr. Benay Spice  TIME OF NOTIFICATION: 5041  RESPONSE:

## 2022-05-04 NOTE — Telephone Encounter (Signed)
I contacted Sarah Davies regarding elevated LFTs and concern for biliary obstruction.  Dr. Benay Spice has spoken with Dr. Benson Norway.  The plan is for ERCP with stent placement.  She understands Dr. Ulyses Amor office will be contacting her.

## 2022-05-06 ENCOUNTER — Encounter: Payer: Self-pay | Admitting: Genetic Counselor

## 2022-05-06 ENCOUNTER — Ambulatory Visit: Payer: Self-pay

## 2022-05-07 ENCOUNTER — Ambulatory Visit (HOSPITAL_BASED_OUTPATIENT_CLINIC_OR_DEPARTMENT_OTHER): Payer: Medicare PPO | Admitting: Anesthesiology

## 2022-05-07 ENCOUNTER — Ambulatory Visit (HOSPITAL_COMMUNITY): Payer: Medicare PPO

## 2022-05-07 ENCOUNTER — Ambulatory Visit (HOSPITAL_COMMUNITY)
Admission: RE | Admit: 2022-05-07 | Discharge: 2022-05-07 | Disposition: A | Discharge status: Home / Self Care | Payer: Medicare PPO | Attending: Gastroenterology | Admitting: Gastroenterology

## 2022-05-07 ENCOUNTER — Encounter (HOSPITAL_COMMUNITY): Payer: Self-pay | Admitting: Gastroenterology

## 2022-05-07 ENCOUNTER — Encounter (HOSPITAL_COMMUNITY)
Admission: RE | Disposition: A | Discharge status: Home / Self Care | Payer: Self-pay | Source: Home / Self Care | Attending: Gastroenterology

## 2022-05-07 ENCOUNTER — Ambulatory Visit (HOSPITAL_COMMUNITY): Payer: Medicare PPO | Admitting: Anesthesiology

## 2022-05-07 ENCOUNTER — Other Ambulatory Visit: Payer: Self-pay

## 2022-05-07 DIAGNOSIS — I10 Essential (primary) hypertension: Secondary | ICD-10-CM | POA: Diagnosis not present

## 2022-05-07 DIAGNOSIS — E119 Type 2 diabetes mellitus without complications: Secondary | ICD-10-CM | POA: Diagnosis not present

## 2022-05-07 DIAGNOSIS — C259 Malignant neoplasm of pancreas, unspecified: Secondary | ICD-10-CM | POA: Diagnosis not present

## 2022-05-07 DIAGNOSIS — C170 Malignant neoplasm of duodenum: Secondary | ICD-10-CM | POA: Diagnosis not present

## 2022-05-07 DIAGNOSIS — Z6837 Body mass index (BMI) 37.0-37.9, adult: Secondary | ICD-10-CM | POA: Insufficient documentation

## 2022-05-07 DIAGNOSIS — E669 Obesity, unspecified: Secondary | ICD-10-CM | POA: Diagnosis not present

## 2022-05-07 DIAGNOSIS — E1136 Type 2 diabetes mellitus with diabetic cataract: Secondary | ICD-10-CM | POA: Diagnosis not present

## 2022-05-07 DIAGNOSIS — K831 Obstruction of bile duct: Secondary | ICD-10-CM | POA: Diagnosis not present

## 2022-05-07 DIAGNOSIS — Z794 Long term (current) use of insulin: Secondary | ICD-10-CM | POA: Insufficient documentation

## 2022-05-07 HISTORY — PX: BILIARY STENT PLACEMENT: SHX5538

## 2022-05-07 HISTORY — PX: ENDOSCOPIC RETROGRADE CHOLANGIOPANCREATOGRAPHY (ERCP) WITH PROPOFOL: SHX5810

## 2022-05-07 HISTORY — PX: SPHINCTEROTOMY: SHX5544

## 2022-05-07 LAB — GLUCOSE, CAPILLARY
Glucose-Capillary: 290 mg/dL — ABNORMAL HIGH (ref 70–99)
Glucose-Capillary: 296 mg/dL — ABNORMAL HIGH (ref 70–99)

## 2022-05-07 SURGERY — ENDOSCOPIC RETROGRADE CHOLANGIOPANCREATOGRAPHY (ERCP) WITH PROPOFOL
Anesthesia: General

## 2022-05-07 MED ORDER — FENTANYL CITRATE (PF) 100 MCG/2ML IJ SOLN
INTRAMUSCULAR | Status: AC
  Filled 2022-05-07: qty 2

## 2022-05-07 MED ORDER — LACTATED RINGERS IV SOLN: INTRAVENOUS | Status: DC

## 2022-05-07 MED ORDER — DICLOFENAC SUPPOSITORY 100 MG
RECTAL | Status: DC | PRN
  Administered 2022-05-07: 100 mg via RECTAL

## 2022-05-07 MED ORDER — ROCURONIUM BROMIDE 10 MG/ML (PF) SYRINGE
PREFILLED_SYRINGE | INTRAVENOUS | Status: DC | PRN
  Administered 2022-05-07: 50 mg via INTRAVENOUS
  Administered 2022-05-07: 10 mg via INTRAVENOUS

## 2022-05-07 MED ORDER — SODIUM CHLORIDE 0.9 % IV SOLN: INTRAVENOUS | Status: DC

## 2022-05-07 MED ORDER — EPHEDRINE SULFATE-NACL 50-0.9 MG/10ML-% IV SOSY
PREFILLED_SYRINGE | INTRAVENOUS | Status: DC | PRN
  Administered 2022-05-07 (×2): 5 mg via INTRAVENOUS

## 2022-05-07 MED ORDER — CIPROFLOXACIN IN D5W 400 MG/200ML IV SOLN
INTRAVENOUS | Status: AC
  Filled 2022-05-07: qty 200

## 2022-05-07 MED ORDER — LIDOCAINE 2% (20 MG/ML) 5 ML SYRINGE
INTRAMUSCULAR | Status: DC | PRN
  Administered 2022-05-07: 80 mg via INTRAVENOUS

## 2022-05-07 MED ORDER — DICLOFENAC SUPPOSITORY 100 MG
RECTAL | Status: AC
  Filled 2022-05-07: qty 1

## 2022-05-07 MED ORDER — PROPOFOL 10 MG/ML IV BOLUS
INTRAVENOUS | Status: DC | PRN
  Administered 2022-05-07: 130 mg via INTRAVENOUS

## 2022-05-07 MED ORDER — PHENYLEPHRINE 80 MCG/ML (10ML) SYRINGE FOR IV PUSH (FOR BLOOD PRESSURE SUPPORT)
PREFILLED_SYRINGE | INTRAVENOUS | Status: DC | PRN
  Administered 2022-05-07 (×2): 160 ug via INTRAVENOUS
  Administered 2022-05-07: 120 ug via INTRAVENOUS
  Administered 2022-05-07: 160 ug via INTRAVENOUS

## 2022-05-07 MED ORDER — CIPROFLOXACIN IN D5W 400 MG/200ML IV SOLN
INTRAVENOUS | Status: DC | PRN
  Administered 2022-05-07: 400 mg via INTRAVENOUS

## 2022-05-07 MED ORDER — SODIUM CHLORIDE 0.9 % IV SOLN
INTRAVENOUS | Status: DC | PRN
  Administered 2022-05-07: 20 mL

## 2022-05-07 MED ORDER — FENTANYL CITRATE (PF) 100 MCG/2ML IJ SOLN
INTRAMUSCULAR | Status: DC | PRN
  Administered 2022-05-07: 50 ug via INTRAVENOUS

## 2022-05-07 MED ORDER — PROPOFOL 10 MG/ML IV BOLUS
INTRAVENOUS | Status: AC
  Filled 2022-05-07: qty 20

## 2022-05-07 MED ORDER — ONDANSETRON HCL 4 MG/2ML IJ SOLN
INTRAMUSCULAR | Status: DC | PRN
  Administered 2022-05-07: 4 mg via INTRAVENOUS

## 2022-05-07 NOTE — Anesthesia Procedure Notes (Signed)
Procedure Name: Intubation Date/Time: 05/07/2022 12:50 PM  Performed by: Sharlette Dense, CRNAPre-anesthesia Checklist: Patient identified, Emergency Drugs available, Suction available and Patient being monitored Patient Re-evaluated:Patient Re-evaluated prior to induction Oxygen Delivery Method: Circle system utilized Preoxygenation: Pre-oxygenation with 100% oxygen Induction Type: IV induction Ventilation: Mask ventilation without difficulty Laryngoscope Size: Miller and 2 Grade View: Grade I Tube type: Oral Tube size: 7.0 mm Number of attempts: 1 Airway Equipment and Method: Stylet and Oral airway Placement Confirmation: ETT inserted through vocal cords under direct vision, positive ETCO2 and breath sounds checked- equal and bilateral Secured at: 22 cm Tube secured with: Tape Dental Injury: Teeth and Oropharynx as per pre-operative assessment

## 2022-05-07 NOTE — Op Note (Signed)
[facility] Patient Name: Sarah Davies Procedure Date: 05/07/2022 MRN: [id number] Attending MD: Carol Ada , MD Date of Birth: 18-Jan-1941 CSN: [id number] Age: 81 Admit Type: Outpatient Procedure:                ERCP Indications:              Malignant stricture of the common bile duct Providers:                Carol Ada, MD, Burtis Junes, RN, Darliss Cheney,                            Technician, Fransico Setters Mbumina, Technician Referring MD:              Medicines:                General Anesthesia Complications:            No immediate complications. Estimated Blood Loss:     Estimated blood loss: none. Procedure:                Pre-Anesthesia Assessment:                           - Prior to the procedure, a History and Physical                            was performed, and patient medications and                            allergies were reviewed. The patient's tolerance of                            previous anesthesia was also reviewed. The risks                            and benefits of the procedure and the sedation                            options and risks were discussed with the patient.                            All questions were answered, and informed consent                            was obtained. Prior Anticoagulants: The patient has                            taken no previous anticoagulant or antiplatelet                            agents. ASA Grade Assessment: III - A patient with                            severe systemic disease. After reviewing the risks  and benefits, the patient was deemed in                            satisfactory condition to undergo the procedure.                           - Sedation was administered by an anesthesia                            professional. General anesthesia was attained.                           After obtaining informed consent, the scope was                            passed  under direct vision. Throughout the                            procedure, the patient's blood pressure, pulse, and                            oxygen saturations were monitored continuously. The                            [id number]-Q190V ([id number]) Olympus duodenoscope was                            introduced through the mouth, and used to inject                            contrast into and used to inject contrast into the                            bile duct and dorsal pancreatic duct. The ERCP was                            technically difficult and complex. The patient                            tolerated the procedure well. Scope In: Scope Out: Findings:      The major papilla was normal. Placement of a short 0.035 inch Soft       Jagwire into the biliary tree was attempted. This passed successfully.       The entire biliary tree was markedly dilated, with a mass causing an       obstruction. The largest diameter was 20 mm. A 10 mm biliary       sphincterotomy was made with a traction (standard) sphincterotome using       ERBE electrocautery. There was no post-sphincterotomy bleeding. One 10       mm by 4 cm covered metal stent was placed 3 cm into the common bile       duct. Bile flowed through the stent. The stent was in good position.      Placing the patient in a 45 degree angle helped the duodenoscope  pass       into the second portion of the duodenum. Prior to successful passage the       endoscope looped in the gastric lumen. There was anatomic distortion of       the second portion of the duodenum secondary to the pancreatic cancer.       There was an ulceration and clear invasion of the cancer in to the lumen       of the duodenum. Maneuverability was tight, but the ampulla was       identified. There was some intermittent spontaneous passage of bile.       Cannulation was performed and the guidewire went into the PD. A two wire       technique was performed, but it was not helpful  and this technique was       abandoned. A sphincterotomy was created to help expose the CBD. Further       attempts with cannulation ultimately allowed for the guidewire to pass       into the CBD. It was secured in the right intrahepatic ducts. Contrast       injection revealed a diffuse dilation of the CBD and some of the       intrahepatics. The CBD measured 20 mm. The sphincterotomy was extended       and this lead to more drainage of contrast. A 10 mm x 40 mm covered       metallic stent was placed. It was only after placement of the stent that       the stricutre was identified. The stricture measured approximately 10-15       mm in the most distal CBD. Excellent drainage and placement of the stent       was achieved. Impression:               - The major papilla appeared normal.                           - The entire biliary tree was markedly dilated,                            with a mass causing an obstruction.                           - A biliary sphincterotomy was performed.                           - One covered metal stent was placed into the                            common bile duct. Moderate Sedation:      Not Applicable - Patient had care per Anesthesia. Recommendation:           - Patient has a contact number available for                            emergencies. The signs and symptoms of potential                            delayed complications were discussed with the  patient. Return to normal activities tomorrow.                            Written discharge instructions were provided to the                            patient.                           - Chemotherapy per Oncology. Procedure Code(s):        --- Professional ---                           [phone], Endoscopic retrograde                            cholangiopancreatography (ERCP); with placement of                            endoscopic stent into biliary or pancreatic duct,                             including pre- and post-dilation and guide wire                            passage, when performed, including sphincterotomy,                            when performed, each stent Diagnosis Code(s):        --- Professional ---                           K83.1, Obstruction of bile duct CPT copyright 2019 American Medical Association. All rights reserved. The codes documented in this report are preliminary and upon coder review may  be revised to meet current compliance requirements. Carol Ada, MD Carol Ada, MD 05/07/2022 2:14:55 PM This report has been signed electronically. Number of Addenda: 0

## 2022-05-07 NOTE — Discharge Instructions (Signed)

## 2022-05-07 NOTE — Transfer of Care (Signed)
Immediate Anesthesia Transfer of Care Note  Patient: Sarah Davies  Procedure(s) Performed: ENDOSCOPIC RETROGRADE CHOLANGIOPANCREATOGRAPHY (ERCP) WITH PROPOFOL BILIARY STENT PLACEMENT SPHINCTEROTOMY  Patient Location: Endoscopy Unit  Anesthesia Type:General  Level of Consciousness: drowsy  Airway & Oxygen Therapy: Patient Spontanous Breathing and Patient connected to face mask oxygen  Post-op Assessment: Report given to RN and Post -op Vital signs reviewed and stable  Post vital signs: Reviewed and stable  Last Vitals:  Vitals Value Taken Time  BP    Temp    Pulse 72 05/07/22 1408  Resp 19 05/07/22 1408  SpO2 100 % 05/07/22 1408  Vitals shown include unvalidated device data.  Last Pain:  Vitals:   05/07/22 1116  TempSrc: Oral  PainSc: 3          Complications: No notable events documented.

## 2022-05-07 NOTE — Anesthesia Postprocedure Evaluation (Signed)
Anesthesia Post Note  Patient: [name]  Procedure(s) Performed: ENDOSCOPIC RETROGRADE CHOLANGIOPANCREATOGRAPHY (ERCP) WITH PROPOFOL BILIARY STENT PLACEMENT SPHINCTEROTOMY     Patient location during evaluation: PACU Anesthesia Type: General Level of consciousness: awake and alert Pain management: pain level controlled Vital Signs Assessment: post-procedure vital signs reviewed and stable Respiratory status: spontaneous breathing, nonlabored ventilation and respiratory function stable Cardiovascular status: blood pressure returned to baseline and stable Postop Assessment: no apparent nausea or vomiting Anesthetic complications: no   No notable events documented.  Last Vitals:  Vitals:   05/07/22 1420 05/07/22 1433  BP: (!) 121/57 (!) 119/56  Pulse: 73 68  Resp: 18 20  Temp:    SpO2: 90% 92%    Last Pain:  Vitals:   05/07/22 1116  TempSrc: Oral  PainSc: 3                  Lynda Rainwater

## 2022-05-07 NOTE — Anesthesia Preprocedure Evaluation (Signed)
Anesthesia Evaluation  Patient identified by MRN, date of birth, ID band Patient awake    Reviewed: Allergy & Precautions, NPO status , Patient's Chart, lab work & pertinent test results  Airway Mallampati: II  TM Distance: >3 FB Neck ROM: Full    Dental  (+) Teeth Intact, Dental Advisory Given   Pulmonary neg pulmonary ROS,    breath sounds clear to auscultation       Cardiovascular hypertension, Pt. on home beta blockers  Rhythm:Regular Rate:Normal     Neuro/Psych negative neurological ROS  negative psych ROS   GI/Hepatic negative GI ROS, Neg liver ROS,   Endo/Other  diabetes, Type 2, Oral Hypoglycemic Agents, Insulin Dependent  Renal/GU negative Renal ROS     Musculoskeletal negative musculoskeletal ROS (+)   Abdominal (+) + obese,   Peds  Hematology negative hematology ROS (+)   Anesthesia Other Findings   Reproductive/Obstetrics                             Anesthesia Physical  Anesthesia Plan  ASA: 3  Anesthesia Plan: General   Post-op Pain Management: Minimal or no pain anticipated   Induction: Intravenous  PONV Risk Score and Plan: 3 and Ondansetron, Dexamethasone, Midazolam and Treatment may vary due to age or medical condition  Airway Management Planned: Oral ETT  Additional Equipment: None  Intra-op Plan:   Post-operative Plan: Extubation in OR  Informed Consent: I have reviewed the patients History and Physical, chart, labs and discussed the procedure including the risks, benefits and alternatives for the proposed anesthesia with the patient or authorized representative who has indicated his/her understanding and acceptance.       Plan Discussed with: CRNA  Anesthesia Plan Comments:         Anesthesia Quick Evaluation

## 2022-05-07 NOTE — H&P (Signed)
Sarah Davies HPI: The patient was recently diagnosed with pancreatic cancer.  At that time she did not have any obstruction of her biliary ducts.  Over the interval time period her liver enzymes and TB increased consistent with an obstructive pattern.  With the elevation of her TB she was not able to continue with chemotherapy.  Past Medical History:  Diagnosis Date   Allergy    Cataract    Diabetes mellitus without complication ([place])    Hypertension     Past Surgical History:  Procedure Laterality Date   ABDOMINAL HYSTERECTOMY     CHOLECYSTECTOMY     ESOPHAGOGASTRODUODENOSCOPY (EGD) WITH PROPOFOL N/A 03/26/2022   Procedure: ESOPHAGOGASTRODUODENOSCOPY (EGD) WITH PROPOFOL;  Surgeon: Carol Ada, MD;  Location: [facility] ENDOSCOPY;  Service: Gastroenterology;  Laterality: N/A;   EUS N/A 03/26/2022   Procedure: UPPER ENDOSCOPIC ULTRASOUND (EUS) LINEAR;  Surgeon: Carol Ada, MD;  Location: [facility]OSCOPY;  Service: Gastroenterology;  Laterality: N/A;   FINE NEEDLE ASPIRATION N/A 03/26/2022   Procedure: FINE NEEDLE ASPIRATION (FNA) LINEAR;  Surgeon: Carol Ada, MD;  Location: [facility] ENDOSCOPY;  Service: Gastroenterology;  Laterality: N/A;   IR IMAGING GUIDED PORT INSERTION  04/20/2022   SPINE SURGERY      Family History  Problem Relation Age of Onset   Breast cancer Sister 58 - 39       metastatic   Throat cancer Sister    Cancer Maternal Grandmother        unknown type    Social History:  reports that she has never smoked. She has never used smokeless tobacco. She reports that she does not drink alcohol and does not use drugs.  Allergies:  Allergies  Allergen Reactions   Nicotine Other (See Comments)    Throat closes    Other Other (See Comments)    Joint pain Animal Dander From when she was tested, it was stimulated.      Shellfish Allergy Other (See Comments)    Has never had a reaction, was just told through allergy testing that she is allergic to shellfish "Throat closes"    Molds & Smuts Other (See Comments)    From when she was tested, it was stimulated.      Statins Other (See Comments)    Muscle aches    Medications: Scheduled: Continuous:  No results found for this or any previous visit (from the past 24 hour(s)).   No results found.  ROS:  As stated above in the HPI otherwise negative.  There were no vitals taken for this visit.    PE: Gen: NAD, Alert and Oriented HEENT:  [place]/AT, EOMI Neck: Supple, no LAD Lungs: CTA Bilaterally CV: RRR without M/G/R ABD: Soft, NTND, +BS Ext: No C/C/E  Assessment/Plan: 1) Pancreatic cancer. 2) Obstructive jaundice.  Plan: 1) ERCP with stent placement.  Sarah Davies D 05/07/2022, 11:11 AM

## 2022-05-09 ENCOUNTER — Other Ambulatory Visit: Payer: Self-pay | Admitting: Oncology

## 2022-05-10 ENCOUNTER — Encounter (HOSPITAL_COMMUNITY): Payer: Self-pay | Admitting: Gastroenterology

## 2022-05-10 ENCOUNTER — Other Ambulatory Visit: Payer: Self-pay | Admitting: Pharmacist

## 2022-05-10 DIAGNOSIS — E1165 Type 2 diabetes mellitus with hyperglycemia: Secondary | ICD-10-CM

## 2022-05-10 NOTE — Chronic Care Management (AMB) (Signed)
Chief Complaint  Patient presents with   Diabetes    Sarah Davies is a 81 y.o. year old female who presented for a telephone visit.   They were referred to the pharmacist by their PCP for assistance in managing diabetes.   Patient is participating in a Managed Medicaid Plan:  Yes  Subjective:  Care Team: Primary Care Provider: Fenton Foy, NP ; Next Scheduled Visit: not scheduled  Medication Access/Adherence  Current Pharmacy:  [id number] - [place], [address] [place] [address] Phone: [phone] Fax: [phone]   Patient reports affordability concerns with their medications: No  Patient reports access/transportation concerns to their pharmacy: No  Patient reports adherence concerns with their medications:  No     Diabetes:  Current medications: Lantus 22 units daily, glipizide XL 7.5 mg daily  Current glucose readings: consistently high 200s-300s  Ordered Libre 3 from [facility]. Per [facility], awaiting final insurance authorization  Patient denies hypoglycemic s/sx including dizziness, shakiness, sweating.   Current meal patterns:  - Breakfast: [place] bacon, eggs; Creon  - Lunch: smoothie w/ bananas, milk, protein power; Creon - Supper: baked chicken, potato, lima beans (6 pm) - Bedtime: peanut butter + bread (Creon) - reports she was told to take doxazosin with a meal, as she was having stomach upset without it. Since she is eating a snack, she takes Creon. Reports reduction in stomach upset - Drinks: water; sometimes ginger ale and water mixed     Health Maintenance  Health Maintenance Due  Topic Date Due   OPHTHALMOLOGY EXAM  Never done   TETANUS/TDAP  Never done   Zoster Vaccines- Shingrix (1 of 2) Never done   DEXA SCAN  Never done   Pneumonia Vaccine 69+ Years old (2 - PPSV23 or PCV20) 07/27/2017   COVID-19 Vaccine (3 - Pfizer risk series) 01/30/2020     Objective: Lab  Results  Component Value Date   HGBA1C 13.0 (H) 03/22/2022    Lab Results  Component Value Date   CREATININE 0.49 05/04/2022   BUN 14 05/04/2022   NA 135 05/04/2022   K 3.4 (L) 05/04/2022   CL 97 (L) 05/04/2022   CO2 29 05/04/2022    No results found for: "CHOL", "HDL", "LDLCALC", "LDLDIRECT", "TRIG", "CHOLHDL"  Medications Reviewed Today     Reviewed by Osker Mason, RPH-CPP (Pharmacist) on 05/10/22 at 1551  Med List Status: <None>   Medication Order Taking? Sig Documenting Provider Last Dose Status Informant  ACCU-CHEK GUIDE test strip [id number]  PLEASE SEE ATTACHED FOR DETAILED DIRECTIONS [provider]  Active Self  Accu-Chek Softclix Lancets lancets [id number]  4 (four) times daily. [provider]  Active Self  acetaminophen (TYLENOL) 500 MG tablet [id number]  Take 1,000 mg by mouth every 6 (six) hours as needed for moderate pain. [provider]  Active Self  amLODipine (NORVASC) 10 MG tablet [id number]  Take 1 tablet (10 mg total) by mouth daily. Fenton Foy, NP  Active Self  b complex vitamins tablet [id number]  Take 1 tablet by mouth daily. [provider]  Active Self  blood glucose meter kit and supplies [id number]  Dispense based on patient and insurance preference. Use up to four times daily as directed. (FOR ICD-10 E10.9, E11.9). Regalado, Belkys A, MD  Active Self  calcium carbonate (OS-CAL) 600 MG TABS [id number]  Take 600 mg by mouth 2 (two) times daily with a meal. [provider]  Active Self  carvedilol (COREG) 25 MG tablet [id number]  Take 50 mg by mouth 2 (two) times daily with a meal. [provider]  Active Self  cholecalciferol (VITAMIN D) 1000 UNITS tablet [id number]  Take 1,000 Units by mouth daily. [provider]  Active Self  Continuous Blood Gluc Receiver (FREESTYLE LIBRE 2 READER) [place]VI [id number]  Use to check blood sugar continuously - sent to DME supplier [facility] [phone]) Fenton Foy, NP  Active Self  Continuous Blood Gluc Sensor (FREESTYLE LIBRE 2 SENSOR) [place] [id number]  Use to check blood sugar continuously - sent to DME supplier [facility] [phone]) Fenton Foy, NP  Active Self  doxazosin (CARDURA) 4 MG tablet [id number]  Take 4 mg by mouth at bedtime.  [provider]  Active Self  fluconazole (DIFLUCAN) 100 MG tablet [id number]  Take 200 mg by mouth today. Then 100 mg by mouth daily for 5 days. Ladell Pier, MD  Active Self  furosemide (LASIX) 20 MG tablet [id number]  Take 20 mg by mouth daily.  [provider]  Active Self  glipiZIDE (GLUCOTROL XL) 5 MG 24 hr tablet [id number] Yes Take 7.5 mg by mouth daily. [provider] Taking Active Self  insulin glargine (LANTUS) 100 UNIT/ML Solostar Pen [id number] Yes Inject 22 Units into the skin daily. Fenton Foy, NP Taking Active Self  Insulin Pen Needle (PEN NEEDLES 3/16") 31G X 5 MM MISC [id number]  1 Application by Does not apply route daily. Regalado, Belkys A, MD  Active Self  lidocaine-prilocaine (EMLA) cream [id number]  Apply 1 Application topically as needed. Ladell Pier, MD  Active Self  lipase/protease/amylase (CREON) 36000 UNITS CPEP capsule [id number]  Take 1 capsule (36,000 Units total) by mouth 3 (three) times daily before meals.  Patient taking differently: Take 36,000 Units by mouth 3 (three) times daily with meals.   Regalado, Belkys A, MD  Active Self  lisinopril (ZESTRIL) 20 MG tablet [id number]  Take 20 mg by mouth daily. [provider]  Active Self  Magnesium 500 MG TABS [id number]  Take 500 mg by mouth daily. [provider]  Active Self  ondansetron (ZOFRAN) 4 MG tablet [id number]  Take 1 tablet (4 mg total) by mouth every 6 (six) hours as needed for nausea. Regalado, Belkys A, MD  Active Self  potassium chloride SA (KLOR-CON M) 20 MEQ tablet [id number]  Take 1 tablet (20 mEq total) by mouth daily. Start evening of  05/03/22. Owens Shark, NP  Active Self  prochlorperazine (COMPAZINE) 10 MG tablet [id number]  Take 1 tablet (10 mg total) by mouth every 6 (six) hours as needed for nausea or vomiting. Ladell Pier, MD  Active Self              Assessment/Plan:   Diabetes: - Currently uncontrolled - Provided patient with the phone number for [facility] to call and follow up on order.  - Reviewed insulin injection technique. Reasonably confident that patient is following appropriate technique. She notes that she administers in her arm, which may have an impact on distribution. However, patient is not comfortable injecting in her stomach or thigh - Recommended to increase Lantus to 28 units daily (~30% increase), however, patient declines. She is uncomfortable with increasing her dose since the last dose increase had no effect on glucose readings. She has an appointment with endocrinology on Friday. Encouraged patient to notify endo that we are awaiting her Elenor Legato order  Follow Up Plan: phone call in 6 weeks  Catie TJodi Mourning, PharmD, [facility] [phone]

## 2022-05-10 NOTE — Patient Instructions (Signed)
Ms. Asencio,   It was great talking to you!  Please let your endocrinologist know that we have placed the order for the [facility] system through [facility].   Let us know if you have any questions or concerns!  Catie Hedwig Morton, PharmD, [facility] [phone]

## 2022-05-11 ENCOUNTER — Inpatient Hospital Stay: Payer: Medicare PPO

## 2022-05-11 ENCOUNTER — Inpatient Hospital Stay: Payer: Medicare PPO | Admitting: Oncology

## 2022-05-11 ENCOUNTER — Encounter: Payer: Self-pay | Admitting: Oncology

## 2022-05-11 ENCOUNTER — Telehealth: Payer: Self-pay

## 2022-05-11 ENCOUNTER — Inpatient Hospital Stay: Payer: Medicare PPO | Attending: Nurse Practitioner

## 2022-05-11 VITALS — BP 124/67 | HR 67 | Resp 18

## 2022-05-11 VITALS — BP 114/63 | HR 65 | Temp 98.1°F | Resp 18 | Ht 66.0 in | Wt 235.0 lb

## 2022-05-11 DIAGNOSIS — E1169 Type 2 diabetes mellitus with other specified complication: Secondary | ICD-10-CM | POA: Diagnosis not present

## 2022-05-11 DIAGNOSIS — C250 Malignant neoplasm of head of pancreas: Secondary | ICD-10-CM | POA: Insufficient documentation

## 2022-05-11 DIAGNOSIS — R197 Diarrhea, unspecified: Secondary | ICD-10-CM | POA: Insufficient documentation

## 2022-05-11 DIAGNOSIS — Z794 Long term (current) use of insulin: Secondary | ICD-10-CM | POA: Diagnosis not present

## 2022-05-11 DIAGNOSIS — R600 Localized edema: Secondary | ICD-10-CM | POA: Insufficient documentation

## 2022-05-11 DIAGNOSIS — E785 Hyperlipidemia, unspecified: Secondary | ICD-10-CM | POA: Diagnosis not present

## 2022-05-11 DIAGNOSIS — E1140 Type 2 diabetes mellitus with diabetic neuropathy, unspecified: Secondary | ICD-10-CM | POA: Insufficient documentation

## 2022-05-11 DIAGNOSIS — I10 Essential (primary) hypertension: Secondary | ICD-10-CM | POA: Diagnosis not present

## 2022-05-11 DIAGNOSIS — B370 Candidal stomatitis: Secondary | ICD-10-CM | POA: Insufficient documentation

## 2022-05-11 DIAGNOSIS — Z5111 Encounter for antineoplastic chemotherapy: Secondary | ICD-10-CM | POA: Diagnosis not present

## 2022-05-11 DIAGNOSIS — E1165 Type 2 diabetes mellitus with hyperglycemia: Secondary | ICD-10-CM | POA: Insufficient documentation

## 2022-05-11 LAB — CBC WITH DIFFERENTIAL (CANCER CENTER ONLY)
Abs Immature Granulocytes: 0.09 10*3/uL — ABNORMAL HIGH (ref 0.00–0.07)
Basophils Absolute: 0.1 10*3/uL (ref 0.0–0.1)
Basophils Relative: 1 %
Eosinophils Absolute: 0 10*3/uL (ref 0.0–0.5)
Eosinophils Relative: 1 %
HCT: 31.5 % — ABNORMAL LOW (ref 36.0–46.0)
Hemoglobin: 10.2 g/dL — ABNORMAL LOW (ref 12.0–15.0)
Immature Granulocytes: 2 %
Lymphocytes Relative: 24 %
Lymphs Abs: 1.5 10*3/uL (ref 0.7–4.0)
MCH: 27.7 pg (ref 26.0–34.0)
MCHC: 32.4 g/dL (ref 30.0–36.0)
MCV: 85.6 fL (ref 80.0–100.0)
Monocytes Absolute: 1 10*3/uL (ref 0.1–1.0)
Monocytes Relative: 16 %
Neutro Abs: 3.5 10*3/uL (ref 1.7–7.7)
Neutrophils Relative %: 56 %
Platelet Count: 339 10*3/uL (ref 150–400)
RBC: 3.68 MIL/uL — ABNORMAL LOW (ref 3.87–5.11)
RDW: 13.2 % (ref 11.5–15.5)
WBC Count: 6.1 10*3/uL (ref 4.0–10.5)
nRBC: 0 % (ref 0.0–0.2)

## 2022-05-11 LAB — CMP (CANCER CENTER ONLY)
ALT: 83 U/L — ABNORMAL HIGH (ref 0–44)
AST: 58 U/L — ABNORMAL HIGH (ref 15–41)
Albumin: 3.1 g/dL — ABNORMAL LOW (ref 3.5–5.0)
Alkaline Phosphatase: 254 U/L — ABNORMAL HIGH (ref 38–126)
Anion gap: 8 (ref 5–15)
BUN: 15 mg/dL (ref 8–23)
CO2: 31 mmol/L (ref 22–32)
Calcium: 9.3 mg/dL (ref 8.9–10.3)
Chloride: 96 mmol/L — ABNORMAL LOW (ref 98–111)
Creatinine: 0.7 mg/dL (ref 0.44–1.00)
GFR, Estimated: >60 mL/min (ref >60)
Glucose, Bld: 453 mg/dL — ABNORMAL HIGH (ref 70–99)
Potassium: 3.8 mmol/L (ref 3.5–5.1)
Sodium: 135 mmol/L (ref 135–145)
Total Bilirubin: 1.3 mg/dL — ABNORMAL HIGH (ref 0.3–1.2)
Total Protein: 6.5 g/dL (ref 6.5–8.1)

## 2022-05-11 MED ORDER — PROCHLORPERAZINE MALEATE 10 MG PO TABS
10.0000 mg | ORAL_TABLET | Freq: Once | ORAL | Status: AC
  Administered 2022-05-11: 10 mg via ORAL
  Filled 2022-05-11: qty 1

## 2022-05-11 MED ORDER — PACLITAXEL PROTEIN-BOUND CHEMO INJECTION 100 MG
100.0000 mg/m2 | Freq: Once | INTRAVENOUS | Status: AC
  Administered 2022-05-11: 225 mg via INTRAVENOUS
  Filled 2022-05-11: qty 45

## 2022-05-11 MED ORDER — HEPARIN SOD (PORK) LOCK FLUSH 100 UNIT/ML IV SOLN
500.0000 [IU] | Freq: Once | INTRAVENOUS | Status: AC | PRN
  Administered 2022-05-11: 500 [IU]

## 2022-05-11 MED ORDER — SODIUM CHLORIDE 0.9 % IV SOLN: Freq: Once | INTRAVENOUS | Status: AC

## 2022-05-11 MED ORDER — SODIUM CHLORIDE 0.9% FLUSH
10.0000 mL | INTRAVENOUS | Status: DC | PRN
  Administered 2022-05-11: 10 mL

## 2022-05-11 MED ORDER — SODIUM CHLORIDE 0.9 % IV SOLN
1000.0000 mg/m2 | Freq: Once | INTRAVENOUS | Status: AC
  Administered 2022-05-11: 2242 mg via INTRAVENOUS
  Filled 2022-05-11: qty 10.52

## 2022-05-11 MED ORDER — INSULIN ASPART 100 UNIT/ML IJ SOLN
10.0000 [IU] | Freq: Once | INTRAMUSCULAR | Status: AC
  Administered 2022-05-11: 10 [IU] via SUBCUTANEOUS
  Filled 2022-05-11: qty 0.1

## 2022-05-11 NOTE — Progress Notes (Signed)
Formatting of this note might be different from the original.  Patient seen by Dr. Truett Perna today    Vitals are within treatment parameters.    Labs reviewed by Dr. Truett Perna and are not all within treatment parameters. AST 58 ALt 83 ok to treat per Dr Truett Perna   Glucose 453 10 units of regular insulin ordered with repeat CBG in 1 hour     Per physician team, patient is ready for treatment and there are NO modifications to the treatment plan.     Electronically signed by Jacklynn Lewis, LPN at 16/07/9603  2:13 PM EDT

## 2022-05-11 NOTE — Progress Notes (Signed)
Formatting of this note is different from the original.  Images from the original note were not included.    [facility]  OFFICE PROGRESS NOTE    Diagnosis: Pancreas cancer    INTERVAL HISTORY:     Lynn Blackwell completed a course of fluconazole for treatment of severe oropharyngeal candidiasis.  She reports improvement in swallowing.  The liver enzymes and bilirubin were elevated when she was here last week.  She was referred to Dr. Elnoria Howard and was taken to an ERCP on 05/07/2022.  The biliary tree was markedly dilated.  A covered metal stent was placed in the common bile duct.  A stricture was noted in the distal common bile duct.  Pancreas cancer was noted to distort the second portion of the duodenum with ulceration and invasion of the cancer into the lumen of the duodenum.    Lynn Blackwell is here with her daughter.  She reports feeling better.  She has mild abdominal discomfort.  She is not taking pain medication.  She is ambulatory in the home.    Objective:    Vital signs in last 24 hours:    Blood pressure 114/63, pulse 65, temperature 98.1 F (36.7 C), temperature source Oral, resp. rate 18, height 5' 6 (1.676 m), weight 235 lb (106.6 kg), SpO2 100 %.      HEENT: No thrush  Resp: Lungs clear bilaterally  Cardio: Regular rate and rhythm  GI: No hepatosplenomegaly, no mass, mild tenderness in the upper abdomen  Vascular: Trace lower leg edema bilaterally  Skin: Mild hyperpigmentation at the right anterior chest wall, inferior to the Port-A-Cath    Portacath/PICC-without erythema    Lab Results:    Lab Results   Component Value Date    WBC 6.1 05/11/2022    HGB 10.2 (L) 05/11/2022    HCT 31.5 (L) 05/11/2022    MCV 85.6 05/11/2022    PLT 339 05/11/2022    NEUTROABS 3.5 05/11/2022     CMP   Lab Results   Component Value Date    NA 135 05/11/2022    K 3.8 05/11/2022    CL 96 (L) 05/11/2022    CO2 31 05/11/2022    GLUCOSE 453 (H) 05/11/2022    BUN 15 05/11/2022    CREATININE 0.70 05/11/2022    CALCIUM 9.3  05/11/2022    PROT 6.5 05/11/2022    ALBUMIN 3.1 (L) 05/11/2022    AST 58 (H) 05/11/2022    ALT 83 (H) 05/11/2022    ALKPHOS 254 (H) 05/11/2022    BILITOT 1.3 (H) 05/11/2022    GFRNONAA >60 05/11/2022     Lab Results   Component Value Date    UEA540 988 (H) 04/06/2022     Imaging:    DG ERCP    Result Date: 05/07/2022  CLINICAL DATA:  Pancreatic mass, presumed pancreatic carcinoma. EXAM: ERCP TECHNIQUE: Multiple spot images obtained with the fluoroscopic device and submitted for interpretation post-procedure. COMPARISON:  Prior CT and MRI studies. FINDINGS: Imaging during ERCP demonstrates cannulation of the common bile duct. Cholangiogram demonstrates dilatation of the common bile duct with distal stricture at the level of the pancreatic head. This was treated with placement of a metallic self expanding biliary stent. Improved patency of the bile duct noted after stenting. 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 This was treated with placement of a metallic self expanding biliary stent. These images were submitted for radiologic  interpretation only. Please see the procedural report for the amount of contrast and the fluoroscopy time utilized. Electronically Signed   By: Irish Lack M.D.   On: 05/07/2022 14:23     DG C-Arm 1-60 Min-No Report    Result Date: 05/07/2022  Fluoroscopy was utilized by the requesting physician.  No radiographic interpretation.      Medications: I have reviewed the patient's current medications.    Assessment/Plan:  Pancreas cancer  03/22/2022 CT abdomen/pelvis-ill-defined 3.6 x 2.5 cm hypodense mass in the uncinate process of the pancreas; possible associated acute pancreatitis; fatty liver; faint hypodense focus in the inferior right lobe of the liver too small to characterize; no intrahepatic biliary dilatation.    03/23/2022 MRI-hypoenhancing mass in the uncinate process/inferior head of the pancreas abutting the SMV  with distortion of the SMV, less than 180 degree involvement by MR but with limited assessment; pancreatic duct narrowing/obstruction and early involvement of the distal common bile duct; subtle hepatic lesion in the right hepatic lobe; hazy mesentery may reflect concomitant mesenteritis or lymphatic congestion; small lymph nodes adjacent to the masslike area in the head of the pancreas.  03/26/2022 upper EUS-mass identified in the pancreatic head and uncinate process, appearance consistent with adenocarcinoma.  FNA pancreas head showed malignant cells consistent with adenocarcinoma.  04/07/2022 CT abdomen/pelvis pancreatic protocol-adenocarcinoma within the pancreatic uncinate process, isolated right hepatic lobe lesion suspicious for metastatic disease, contact of the SMV by tumor over an approximately 180 degree span with no evidence of occlusion and no arterial involvement, nonspecific borderline enlarged portacaval lymph node, small bowel mesenteric nodes most likely related to mesenteric adenitis/panniculitis  Cycle 1 gemcitabine/Abraxane 04/27/2022  Cycle 2 gemcitabine/Abraxane 05/11/2022  Diabetes with recent poor control  Diarrhea, typically after eating  Diabetic neuropathy  Hypertension  Hyperlipidemia  Leg edema-04/06/2022 bilateral lower extremities negative for DVT  Severe oropharyngeal candidiasis 05/03/2022-Diflucan  Biliary obstruction secondary to #1  ERCP 05/07/2022-distal common bile duct stricture, placement of covered metal stent, ulcerated tumor invading the duodenum    Disposition:  Lynn Blackwell has experienced significant improvement in her performance status since completing a course of fluconazole for severe oropharyngeal candidiasis, and undergoing placement of a common bile duct stent 05/07/2022.  The liver enzymes are improved.    She will complete a second cycle of gemcitabine/Abraxane today.  I discussed the treatment plan with Ms. Norland and her daughter.    Her blood sugar is markedly elevated  today.  She will receive regular insulin at the Cancer center today.  We recommend she follow-up closely with her primary provider for blood sugar management.    She will return for an office visit in the next cycle of gemcitabine/Abraxane in 2 weeks.    Thornton Papas, MD    05/11/2022   2:05 PM      Electronically signed by Ladene Artist, MD at 05/11/2022  2:13 PM EDT

## 2022-05-11 NOTE — Telephone Encounter (Signed)
Magic mouth wash was order at [facility] on [address]

## 2022-05-11 NOTE — Progress Notes (Signed)
Blood sugar 412 1 hour after 10 units of insulin administered. Sarah Orleans, LPN and  Dr. Benay Spice notified. Per secure chat from Dr Benson Norway, she can go, needs to avoid concentrated sweets, she should continue to monitor the blood sugar and contact her primary provider if the blood sugar is not improved over the next 24 hours

## 2022-05-11 NOTE — Progress Notes (Signed)
Patient seen by Dr. Benay Spice today  Vitals are within treatment parameters.  Labs reviewed by Dr. Benay Spice and are not all within treatment parameters. AST 58 ALt 83 ok to treat per Dr Benay Spice   Glucose 453 10 units of regular insulin ordered with repeat CBG in 1 hour   Per physician team, patient is ready for treatment and there are NO modifications to the treatment plan.

## 2022-05-11 NOTE — Progress Notes (Signed)
[facility] OFFICE PROGRESS NOTE   Diagnosis: Pancreas cancer  INTERVAL HISTORY:   Ms. Pickron completed a course of fluconazole for treatment of severe oropharyngeal candidiasis.  She reports improvement in swallowing. The liver enzymes and bilirubin were elevated when she was here last week.  She was referred to Dr. Benson Norway and was taken to an ERCP on 05/07/2022.  The biliary tree was markedly dilated.  A covered metal stent was placed in the common bile duct.  A stricture was noted in the distal common bile duct.  Pancreas cancer was noted to distort the second portion of the duodenum with ulceration and invasion of the cancer into the lumen of the duodenum.  Ms. Costley is here with her daughter.  She reports feeling better.  She has mild abdominal discomfort.  She is not taking pain medication.  She is ambulatory in the home.  Objective:  Vital signs in last 24 hours:  Blood pressure 114/63, pulse 65, temperature 98.1 F (36.7 C), temperature source Oral, resp. rate 18, height '5\' 6"'$  (1.676 m), weight 235 lb (106.6 kg), SpO2 100 %.    HEENT: No thrush Resp: Lungs clear bilaterally Cardio: Regular rate and rhythm GI: No hepatosplenomegaly, no mass, mild tenderness in the upper abdomen Vascular: Trace lower leg edema bilaterally Skin: Mild hyperpigmentation at the right anterior chest wall, inferior to the Port-A-Cath  Portacath/PICC-without erythema  Lab Results:  Lab Results  Component Value Date   WBC 6.1 05/11/2022   HGB 10.2 (L) 05/11/2022   HCT 31.5 (L) 05/11/2022   MCV 85.6 05/11/2022   PLT 339 05/11/2022   NEUTROABS 3.5 05/11/2022    CMP  Lab Results  Component Value Date   NA 135 05/11/2022   K 3.8 05/11/2022   CL 96 (L) 05/11/2022   CO2 31 05/11/2022   GLUCOSE 453 (H) 05/11/2022   BUN 15 05/11/2022   CREATININE 0.70 05/11/2022   CALCIUM 9.3 05/11/2022   PROT 6.5 05/11/2022   ALBUMIN 3.1 (L) 05/11/2022   AST 58 (H) 05/11/2022   ALT 83 (H)  05/11/2022   ALKPHOS 254 (H) 05/11/2022   BILITOT 1.3 (H) 05/11/2022   GFRNONAA >60 05/11/2022    Lab Results  Component Value Date   ZOX096 988 (H) 04/06/2022     Imaging:  DG ERCP  Result Date: 05/07/2022 CLINICAL DATA:  Pancreatic mass, presumed pancreatic carcinoma. EXAM: ERCP TECHNIQUE: Multiple spot images obtained with the fluoroscopic device and submitted for interpretation post-procedure. COMPARISON:  Prior CT and MRI studies. FINDINGS: Imaging during ERCP demonstrates cannulation of the common bile duct. Cholangiogram demonstrates dilatation of the common bile duct with distal stricture at the level of the pancreatic head. This was treated with placement of a metallic self expanding biliary stent. Improved patency of the bile duct noted after stenting. 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 This was treated with placement of a metallic self expanding biliary stent. These images were submitted for radiologic interpretation only. Please see the procedural report for the amount of contrast and the fluoroscopy time utilized. Electronically Signed   By: Aletta Edouard M.D.   On: 05/07/2022 14:23   DG C-Arm 1-60 Min-No Report  Result Date: 05/07/2022 Fluoroscopy was utilized by the requesting physician.  No radiographic interpretation.    Medications: I have reviewed the patient's current medications.   Assessment/Plan: Pancreas cancer 03/22/2022 CT abdomen/pelvis-ill-defined 3.6 x 2.5 cm hypodense mass in the uncinate process  of the pancreas; possible associated acute pancreatitis; fatty liver; faint hypodense focus in the inferior right lobe of the liver too small to characterize; no intrahepatic biliary dilatation.   03/23/2022 MRI-hypoenhancing mass in the uncinate process/inferior head of the pancreas abutting the SMV with distortion of the SMV, less than 180 degree involvement by MR but with limited  assessment; pancreatic duct narrowing/obstruction and early involvement of the distal common bile duct; subtle hepatic lesion in the right hepatic lobe; hazy mesentery may reflect concomitant mesenteritis or lymphatic congestion; small lymph nodes adjacent to the masslike area in the head of the pancreas. 03/26/2022 upper EUS-mass identified in the pancreatic head and uncinate process, appearance consistent with adenocarcinoma.  FNA pancreas head showed malignant cells consistent with adenocarcinoma. 04/07/2022 CT abdomen/pelvis pancreatic protocol-adenocarcinoma within the pancreatic uncinate process, isolated right hepatic lobe lesion suspicious for metastatic disease, contact of the SMV by tumor over an approximately 180 degree span with no evidence of occlusion and no arterial involvement, nonspecific borderline enlarged portacaval lymph node, small bowel mesenteric nodes most likely related to mesenteric adenitis/panniculitis Cycle 1 gemcitabine/Abraxane 04/27/2022 Cycle 2 gemcitabine/Abraxane 05/11/2022 Diabetes with recent poor control Diarrhea, typically after eating Diabetic neuropathy Hypertension Hyperlipidemia Leg edema-04/06/2022 bilateral lower extremities negative for DVT Severe oropharyngeal candidiasis 05/03/2022-Diflucan Biliary obstruction secondary to #1 ERCP 05/07/2022-distal common bile duct stricture, placement of covered metal stent, ulcerated tumor invading the duodenum      Disposition: Ms. Lizotte has experienced significant improvement in her performance status since completing a course of fluconazole for severe oropharyngeal candidiasis, and undergoing placement of a common bile duct stent 05/07/2022.  The liver enzymes are improved.  She will complete a second cycle of gemcitabine/Abraxane today.  I discussed the treatment plan with Ms. Gaffin and her daughter.  Her blood sugar is markedly elevated today.  She will receive regular insulin at the Cancer center today.  We  recommend she follow-up closely with her primary provider for blood sugar management.  She will return for an office visit in the next cycle of gemcitabine/Abraxane in 2 weeks.  Betsy Coder, MD  05/11/2022  2:05 PM

## 2022-05-11 NOTE — Patient Instructions (Signed)
[employer]   Discharge Instructions: Thank you for choosing [facility] to provide your oncology and hematology care.   If you have a lab appointment with the [facility], please go directly to the [facility] and check in at the registration area.   Wear comfortable clothing and clothing appropriate for easy access to any Portacath or PICC line.   We strive to give you quality time with your provider. You may need to reschedule your appointment if you arrive late (15 or more minutes).  Arriving late affects you and other patients whose appointments are after yours.  Also, if you miss three or more appointments without notifying the office, you may be dismissed from the clinic at the provider's discretion.      For prescription refill requests, have your pharmacy contact our office and allow 72 hours for refills to be completed.    Today you received the following chemotherapy and/or immunotherapy agents Abraxane, Gemzar.      To help prevent nausea and vomiting after your treatment, we encourage you to take your nausea medication as directed.  BELOW ARE SYMPTOMS THAT SHOULD BE REPORTED IMMEDIATELY: *FEVER GREATER THAN 100.4 F (38 C) OR HIGHER *CHILLS OR SWEATING *NAUSEA AND VOMITING THAT IS NOT CONTROLLED WITH YOUR NAUSEA MEDICATION *UNUSUAL SHORTNESS OF BREATH *UNUSUAL BRUISING OR BLEEDING *URINARY PROBLEMS (pain or burning when urinating, or frequent urination) *BOWEL PROBLEMS (unusual diarrhea, constipation, pain near the anus) TENDERNESS IN MOUTH AND THROAT WITH OR WITHOUT PRESENCE OF ULCERS (sore throat, sores in mouth, or a toothache) UNUSUAL RASH, SWELLING OR PAIN  UNUSUAL VAGINAL DISCHARGE OR ITCHING   Items with * indicate a potential emergency and should be followed up as soon as possible or go to the Emergency Department if any problems should occur.  Please show the CHEMOTHERAPY ALERT CARD or IMMUNOTHERAPY ALERT CARD at  check-in to the Emergency Department and triage nurse.  Should you have questions after your visit or need to cancel or reschedule your appointment, please contact [facility]  Dept: [phone]  and follow the prompts.  Office hours are 8:00 a.m. to 4:30 p.m. Monday - Friday. Please note that voicemails left after 4:00 p.m. may not be returned until the following business day.  We are closed weekends and major holidays. You have access to a nurse at all times for urgent questions. Please call the main number to the clinic Dept: [phone] and follow the prompts.   For any non-urgent questions, you may also contact your provider using MyChart. We now offer e-Visits for anyone 33 and older to request care online for non-urgent symptoms. For details visit [email or web address].   Also download the MyChart app! Go to the app store, search "MyChart", open the app, select [facility], and log in with your MyChart username and password.  Masks are optional in the cancer centers. If you would like for your care team to wear a mask while they are taking care of you, please let them know. You may have one support person who is at least 80 years old accompany you for your appointments.  Nanoparticle Albumin-Bound Paclitaxel injection What is this medication? NANOPARTICLE ALBUMIN-BOUND PACLITAXEL (Na no PAHR ti kuhl  al BYOO muhn-bound  PAK li TAX el) is a chemotherapy drug. It targets fast dividing cells, like cancer cells, and causes these cells to die. This medicine is used to treat advanced breast cancer, lung cancer, and pancreatic cancer. This medicine  may be used for other purposes; ask your health care provider or pharmacist if you have questions. COMMON BRAND NAME(S): Abraxane What should I tell my care team before I take this medication? They need to know if you have any of these conditions: kidney disease liver disease low blood counts, like low white cell,  platelet, or red cell counts lung or breathing disease, like asthma tingling of the fingers or toes, or other nerve disorder an unusual or allergic reaction to paclitaxel, albumin, other chemotherapy, other medicines, foods, dyes, or preservatives pregnant or trying to get pregnant breast-feeding How should I use this medication? This drug is given as an infusion into a vein. It is administered in a hospital or clinic by a specially trained health care professional. Talk to your pediatrician regarding the use of this medicine in children. Special care may be needed. Overdosage: If you think you have taken too much of this medicine contact a poison control center or emergency room at once. NOTE: This medicine is only for you. Do not share this medicine with others. What if I miss a dose? It is important not to miss your dose. Call your doctor or health care professional if you are unable to keep an appointment. What may interact with this medication? This medicine may interact with the following medications: antiviral medicines for hepatitis, HIV or AIDS certain antibiotics like erythromycin and clarithromycin certain medicines for fungal infections like ketoconazole and itraconazole certain medicines for seizures like carbamazepine, phenobarbital, phenytoin gemfibrozil nefazodone rifampin St. John's wort This list may not describe all possible interactions. Give your health care provider a list of all the medicines, herbs, non-prescription drugs, or dietary supplements you use. Also tell them if you smoke, drink alcohol, or use illegal drugs. Some items may interact with your medicine. What should I watch for while using this medication? Your condition will be monitored carefully while you are receiving this medicine. You will need important blood work done while you are taking this medicine. This medicine can cause serious allergic reactions. If you experience allergic reactions like skin  rash, itching or hives, swelling of the face, lips, or tongue, tell your doctor or health care professional right away. In some cases, you may be given additional medicines to help with side effects. Follow all directions for their use. This drug may make you feel generally unwell. This is not uncommon, as chemotherapy can affect healthy cells as well as cancer cells. Report any side effects. Continue your course of treatment even though you feel ill unless your doctor tells you to stop. Call your doctor or health care professional for advice if you get a fever, chills or sore throat, or other symptoms of a cold or flu. Do not treat yourself. This drug decreases your body's ability to fight infections. Try to avoid being around people who are sick. This medicine may increase your risk to bruise or bleed. Call your doctor or health care professional if you notice any unusual bleeding. Be careful brushing and flossing your teeth or using a toothpick because you may get an infection or bleed more easily. If you have any dental work done, tell your dentist you are receiving this medicine. Avoid taking products that contain aspirin, acetaminophen, ibuprofen, naproxen, or ketoprofen unless instructed by your doctor. These medicines may hide a fever. Do not become pregnant while taking this medicine or for 6 months after stopping it. Women should inform their doctor if they wish to become pregnant or think they  might be pregnant. Men should not father a child while taking this medicine or for 3 months after stopping it. There is a potential for serious side effects to an unborn child. Talk to your health care professional or pharmacist for more information. Do not breast-feed an infant while taking this medicine or for 2 weeks after stopping it. This medicine may interfere with the ability to get pregnant or to father a child. You should talk to your doctor or health care professional if you are concerned about  your fertility. What side effects may I notice from receiving this medication? Side effects that you should report to your doctor or health care professional as soon as possible: allergic reactions like skin rash, itching or hives, swelling of the face, lips, or tongue breathing problems changes in vision fast, irregular heartbeat low blood pressure mouth sores pain, tingling, numbness in the hands or feet signs of decreased platelets or bleeding - bruising, pinpoint red spots on the skin, black, tarry stools, blood in the urine signs of decreased red blood cells - unusually weak or tired, feeling faint or lightheaded, falls signs of infection - fever or chills, cough, sore throat, pain or difficulty passing urine signs and symptoms of liver injury like dark yellow or brown urine; general ill feeling or flu-like symptoms; light-colored stools; loss of appetite; nausea; right upper belly pain; unusually weak or tired; yellowing of the eyes or skin swelling of the ankles, feet, hands unusually slow heartbeat Side effects that usually do not require medical attention (report to your doctor or health care professional if they continue or are bothersome): diarrhea hair loss loss of appetite nausea, vomiting tiredness This list may not describe all possible side effects. Call your doctor for medical advice about side effects. You may report side effects to FDA at 1-800-FDA-1088. Where should I keep my medication? This drug is given in a hospital or clinic and will not be stored at home. NOTE: This sheet is a summary. It may not cover all possible information. If you have questions about this medicine, talk to your doctor, pharmacist, or health care provider.  2023 Elsevier/Gold Standard (2017-05-31 00:00:00)  Gemcitabine injection What is this medication? GEMCITABINE (jem SYE ta been) is a chemotherapy drug. This medicine is used to treat many types of cancer like breast cancer, lung cancer,  pancreatic cancer, and ovarian cancer. This medicine may be used for other purposes; ask your health care provider or pharmacist if you have questions. COMMON BRAND NAME(S): Gemzar, Infugem What should I tell my care team before I take this medication? They need to know if you have any of these conditions: blood disorders infection kidney disease liver disease lung or breathing disease, like asthma recent or ongoing radiation therapy an unusual or allergic reaction to gemcitabine, other chemotherapy, other medicines, foods, dyes, or preservatives pregnant or trying to get pregnant breast-feeding How should I use this medication? This drug is given as an infusion into a vein. It is administered in a hospital or clinic by a specially trained health care professional. Talk to your pediatrician regarding the use of this medicine in children. Special care may be needed. Overdosage: If you think you have taken too much of this medicine contact a poison control center or emergency room at once. NOTE: This medicine is only for you. Do not share this medicine with others. What if I miss a dose? It is important not to miss your dose. Call your doctor or health care professional if  you are unable to keep an appointment. What may interact with this medication? medicines to increase blood counts like filgrastim, pegfilgrastim, sargramostim some other chemotherapy drugs like cisplatin vaccines Talk to your doctor or health care professional before taking any of these medicines: acetaminophen aspirin ibuprofen ketoprofen naproxen This list may not describe all possible interactions. Give your health care provider a list of all the medicines, herbs, non-prescription drugs, or dietary supplements you use. Also tell them if you smoke, drink alcohol, or use illegal drugs. Some items may interact with your medicine. What should I watch for while using this medication? Visit your doctor for checks on  your progress. This drug may make you feel generally unwell. This is not uncommon, as chemotherapy can affect healthy cells as well as cancer cells. Report any side effects. Continue your course of treatment even though you feel ill unless your doctor tells you to stop. In some cases, you may be given additional medicines to help with side effects. Follow all directions for their use. Call your doctor or health care professional for advice if you get a fever, chills or sore throat, or other symptoms of a cold or flu. Do not treat yourself. This drug decreases your body's ability to fight infections. Try to avoid being around people who are sick. This medicine may increase your risk to bruise or bleed. Call your doctor or health care professional if you notice any unusual bleeding. Be careful brushing and flossing your teeth or using a toothpick because you may get an infection or bleed more easily. If you have any dental work done, tell your dentist you are receiving this medicine. Avoid taking products that contain aspirin, acetaminophen, ibuprofen, naproxen, or ketoprofen unless instructed by your doctor. These medicines may hide a fever. Do not become pregnant while taking this medicine or for 6 months after stopping it. Women should inform their doctor if they wish to become pregnant or think they might be pregnant. Men should not father a child while taking this medicine and for 3 months after stopping it. There is a potential for serious side effects to an unborn child. Talk to your health care professional or pharmacist for more information. Do not breast-feed an infant while taking this medicine or for at least 1 week after stopping it. Men should inform their doctors if they wish to father a child. This medicine may lower sperm counts. Talk with your doctor or health care professional if you are concerned about your fertility. What side effects may I notice from receiving this medication? Side  effects that you should report to your doctor or health care professional as soon as possible: allergic reactions like skin rash, itching or hives, swelling of the face, lips, or tongue breathing problems pain, redness, or irritation at site where injected signs and symptoms of a dangerous change in heartbeat or heart rhythm like chest pain; dizziness; fast or irregular heartbeat; palpitations; feeling faint or lightheaded, falls; breathing problems signs of decreased platelets or bleeding - bruising, pinpoint red spots on the skin, black, tarry stools, blood in the urine signs of decreased red blood cells - unusually weak or tired, feeling faint or lightheaded, falls signs of infection - fever or chills, cough, sore throat, pain or difficulty passing urine signs and symptoms of kidney injury like trouble passing urine or change in the amount of urine signs and symptoms of liver injury like dark yellow or brown urine; general ill feeling or flu-like symptoms; light-colored stools; loss of  appetite; nausea; right upper belly pain; unusually weak or tired; yellowing of the eyes or skin swelling of ankles, feet, hands Side effects that usually do not require medical attention (report to your doctor or health care professional if they continue or are bothersome): constipation diarrhea hair loss loss of appetite nausea rash vomiting This list may not describe all possible side effects. Call your doctor for medical advice about side effects. You may report side effects to FDA at 1-800-FDA-1088. Where should I keep my medication? This drug is given in a hospital or clinic and will not be stored at home. NOTE: This sheet is a summary. It may not cover all possible information. If you have questions about this medicine, talk to your doctor, pharmacist, or health care provider.  2023 Elsevier/Gold Standard (2017-12-21 00:00:00)

## 2022-05-12 ENCOUNTER — Other Ambulatory Visit: Payer: Self-pay

## 2022-05-14 ENCOUNTER — Emergency Department (HOSPITAL_BASED_OUTPATIENT_CLINIC_OR_DEPARTMENT_OTHER)
Admission: EM | Admit: 2022-05-14 | Discharge: 2022-05-14 | Disposition: A | Discharge status: Home / Self Care | Payer: Medicare PPO | Attending: Emergency Medicine | Admitting: Emergency Medicine

## 2022-05-14 ENCOUNTER — Encounter (HOSPITAL_BASED_OUTPATIENT_CLINIC_OR_DEPARTMENT_OTHER): Payer: Self-pay | Admitting: Obstetrics and Gynecology

## 2022-05-14 ENCOUNTER — Other Ambulatory Visit: Payer: Self-pay

## 2022-05-14 DIAGNOSIS — Z794 Long term (current) use of insulin: Secondary | ICD-10-CM | POA: Insufficient documentation

## 2022-05-14 DIAGNOSIS — Z79899 Other long term (current) drug therapy: Secondary | ICD-10-CM | POA: Diagnosis not present

## 2022-05-14 DIAGNOSIS — I959 Hypotension, unspecified: Secondary | ICD-10-CM | POA: Insufficient documentation

## 2022-05-14 MED ORDER — LACTATED RINGERS IV BOLUS: 1000.0000 mL | Freq: Once | INTRAVENOUS | Status: DC

## 2022-05-14 MED ORDER — SODIUM CHLORIDE 0.9 % IV BOLUS: 1000.0000 mL | Freq: Once | INTRAVENOUS | Status: DC

## 2022-05-14 NOTE — ED Triage Notes (Signed)
Patient reports to the ER for hypotension. She reportedly had a BP of 110/30 at her PCP office and was told to be evaluated at the ER. She is currently being treated for pancreatic cancer. Denies dizziness. Reports she has just been very sleepy lately.

## 2022-05-14 NOTE — ED Notes (Signed)
Pt stated she wants to leave. RN and Dr. Kathrynn Humble explained to pt concern for complaint of low BP prior to arrival and recommending further lab testing and interventions however pt states she understands risks but still wants to leave AMA. Pt signed AMA and daughter signed as witness. Pt was advised to come back in s/s arise.

## 2022-05-14 NOTE — ED Provider Notes (Signed)
[facility] EMERGENCY DEPT Provider Note   CSN: [id number] Arrival date & time: 05/14/22  1158     History  Chief Complaint  Patient presents with   Hypotension    Sarah Davies is a 81 y.o. female.  HPI    Pt comes in with cc of low BP. Patient has history of pancreatic cancer with recent admission for ERCP because of obstruction.  Patient gets chemotherapy, last session was 3 days ago.  Patient had gone to her doctor today.  While there her blood pressure was noted to be 110/40 and 110/30, therefore they sent her to the emergency room.  Patient states that although she has her malaise, she did not feel particularly unwell today.  She denies any new headaches, neck pain, URI-like symptoms, cough, chest pain, shortness of breath, abdominal pain, back pain, UTI-like symptoms, nausea, vomiting, fevers, chills, diarrhea.  Patient thinks that she has been hydrating well.  She denies any dizziness, lightheadedness or near fainting.  Niece at the bedside.  Concurs with the patient.  Neck session for chemo is 2 weeks from now.   Home Medications Prior to Admission medications   Medication Sig Start Date End Date Taking? Authorizing Provider  ACCU-CHEK GUIDE test strip PLEASE SEE ATTACHED FOR DETAILED DIRECTIONS 03/24/22   [provider]  Accu-Chek Softclix Lancets lancets 4 (four) times daily. 03/24/22   [provider]  acetaminophen (TYLENOL) 500 MG tablet Take 1,000 mg by mouth every 6 (six) hours as needed for moderate pain.    [provider]  amLODipine (NORVASC) 10 MG tablet Take 1 tablet (10 mg total) by mouth daily. 04/19/22 07/18/22  Nichols, Tonya S, NP  b complex vitamins tablet Take 1 tablet by mouth daily.    [provider]  blood glucose meter kit and supplies Dispense based on patient and insurance preference. Use up to four times daily as directed. (FOR ICD-10 E10.9, E11.9). 03/24/22   Regalado, Belkys A, MD  calcium  carbonate (OS-CAL) 600 MG TABS Take 600 mg by mouth 2 (two) times daily with a meal.    [provider]  carvedilol (COREG) 25 MG tablet Take 50 mg by mouth 2 (two) times daily with a meal. 03/21/13   [provider]  cholecalciferol (VITAMIN D) 1000 UNITS tablet Take 1,000 Units by mouth daily.    [provider]  Continuous Blood Gluc Receiver (FREESTYLE LIBRE 2 READER) DEVI Use to check blood sugar continuously - sent to DME supplier [employer] [phone]) 04/29/22   Nichols, Tonya S, NP  Continuous Blood Gluc Sensor (FREESTYLE LIBRE 2 SENSOR) MISC Use to check blood sugar continuously - sent to DME supplier [employer] [phone]) 04/29/22   Nichols, Tonya S, NP  doxazosin (CARDURA) 4 MG tablet Take 4 mg by mouth at bedtime.  01/19/13   [provider]  fluconazole (DIFLUCAN) 100 MG tablet Take 200 mg by mouth today. Then 100 mg by mouth daily for 5 days. 05/03/22   Sherrill, Gary B, MD  furosemide (LASIX) 20 MG tablet Take 20 mg by mouth daily.  03/16/13   [provider]  glipiZIDE (GLUCOTROL XL) 5 MG 24 hr tablet Take 7.5 mg by mouth daily. 01/29/22   [provider]  insulin glargine (LANTUS) 100 UNIT/ML Solostar Pen Inject 22 Units into the skin daily. 04/29/22   Nichols, Tonya S, NP  Insulin Pen Needle (PEN NEEDLES 3/16") 31G X 5 MM MISC 1 Application by Does   not apply route daily. 03/24/22   Regalado, Belkys A, MD  lidocaine-prilocaine (EMLA) cream Apply 1 Application topically as needed. 04/19/22   Sherrill, Gary B, MD  lipase/protease/amylase (CREON) 36000 UNITS CPEP capsule Take 1 capsule (36,000 Units total) by mouth 3 (three) times daily before meals. Patient taking differently: Take 36,000 Units by mouth 3 (three) times daily with meals. 03/24/22   Regalado, Belkys A, MD  lisinopril (ZESTRIL) 20 MG tablet Take 20 mg by mouth daily. 03/02/13   [provider]  Magnesium 500 MG TABS Take 500 mg by  mouth daily.    [provider]  ondansetron (ZOFRAN) 4 MG tablet Take 1 tablet (4 mg total) by mouth every 6 (six) hours as needed for nausea. 03/24/22   Regalado, Belkys A, MD  potassium chloride SA (KLOR-CON M) 20 MEQ tablet Take 1 tablet (20 mEq total) by mouth daily. Start evening of 05/03/22. 05/03/22   Thomas, Lisa K, NP  prochlorperazine (COMPAZINE) 10 MG tablet Take 1 tablet (10 mg total) by mouth every 6 (six) hours as needed for nausea or vomiting. 04/19/22   Sherrill, Gary B, MD      Allergies    Nicotine, Other, Shellfish allergy, Molds & smuts, and Statins    Review of Systems   Review of Systems  All other systems reviewed and are negative.   Physical Exam Updated Vital Signs BP (!) 128/50 (BP Location: Right Arm)   Pulse 70   Temp 98.5 F (36.9 C)   Resp 18   Ht 5' 6" (1.676 m)   Wt 106.6 kg   SpO2 99%   BMI 37.93 kg/m  Physical Exam Vitals and nursing note reviewed.  Constitutional:      Appearance: She is well-developed.  HENT:     Head: Atraumatic.  Eyes:     General: Scleral icterus present.     Pupils: Pupils are equal, round, and reactive to light.  Cardiovascular:     Rate and Rhythm: Normal rate.  Pulmonary:     Effort: Pulmonary effort is normal.  Abdominal:     Tenderness: There is no abdominal tenderness.  Musculoskeletal:     Cervical back: Normal range of motion and neck supple.  Skin:    General: Skin is warm and dry.  Neurological:     Mental Status: She is alert and oriented to person, place, and time.     ED Results / Procedures / Treatments   Labs (all labs ordered are listed, but only abnormal results are displayed) Labs Reviewed  COMPREHENSIVE METABOLIC PANEL  CBC WITH DIFFERENTIAL/PLATELET  LACTIC ACID, PLASMA  LACTIC ACID, PLASMA  URINALYSIS, ROUTINE W REFLEX MICROSCOPIC    EKG None  Radiology No results found.  Procedures Procedures    Medications Ordered in ED Medications  sodium chloride 0.9 % bolus  1,000 mL (has no administration in time range)  lactated ringers bolus 1,000 mL (has no administration in time range)    ED Course/ Medical Decision Making/ A&P                           Medical Decision Making Amount and/or Complexity of Data Reviewed Labs: ordered.   This patient presents to the ED with chief complaint(s) of low blood pressure with pertinent past medical history of pancreatic cancer on chemotherapy, diabetes which further complicates the presenting complaint. The complaint involves an extensive differential diagnosis and also carries with it a high risk of   complications and morbidity.    The differential diagnosis that was considered includes infection, occult bacteremia, early sepsis, electrolyte abnormality, dehydration, hypovolemia, CHF, valvular disorder, PE  Patient denies any symptoms of infection.  She also denies any insensible fluid loss and no chest pain or shortness of breath.  The initial plan is to get basic blood work-up, orthostatic and give patient some IV fluid.  EKG has been ordered.  Patient will be ambulated.   Additional history obtained: Additional history obtained from family Records reviewed previous admission documents  Treatment and Reassessment: Nursing staff informed me that patient has changed her mind.  She would like to leave.  I went and discussed patient's concerns with her and the knees.  Patient states that she does not really feel unwell and had a normal day and only came here because her PCP wanted her to come here.  She feels fine and does not think she needs to stay any further in the ER.  She prefers coming back if she gets worse  Patient wants to leave against medical advice. Patient understands that her actions will lead to inadequate medical workup, and that she is at risk of complications of missed diagnosis, which includes morbidity and mortality.  Alternative options discussed -getting labs completed here and IV fluids.   We will discuss with her if admission is needed and she can make a decision at that time whether to stay or leave Opportunity to change mind given. Discussion witnessed by nurse and patient's niece Patient is demonstrating good capacity to make decision. Patient understands that she needs to return to the ER immediately if her symptoms get worse.  Niece is also comfortable with that.  She is visiting from [place] and will be staying with the patient.  Final Clinical Impression(s) / ED Diagnoses Final diagnoses:  Hypotension, unspecified hypotension type    Rx / DC Orders ED Discharge Orders     None         Varney Biles, MD 05/14/22 1510

## 2022-05-14 NOTE — Discharge Instructions (Signed)
Your BP was lower than usual. We recommend that you hydrate well. Perhaps you can skip any BP medicine you are taking this evening.  Return to the ER if you have dizziness, near fainting or fainting, chest pain, shortness of breath.

## 2022-05-19 NOTE — Telephone Encounter (Signed)
Patient should report back to the ED if blood pressure is still low. Please schedule her a follow up appointment with me or for nurse visit this week. Thanks.

## 2022-05-23 ENCOUNTER — Other Ambulatory Visit: Payer: Self-pay | Admitting: Oncology

## 2022-05-23 NOTE — Progress Notes (Signed)
[facility] - Pancreatic Adenocarcinoma  No Change  Continue With Treatment as Ordered.  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

## 2022-05-25 ENCOUNTER — Encounter: Payer: Self-pay | Admitting: Nurse Practitioner

## 2022-05-25 ENCOUNTER — Encounter: Payer: Self-pay | Admitting: *Deleted

## 2022-05-25 ENCOUNTER — Inpatient Hospital Stay: Payer: Medicare PPO

## 2022-05-25 ENCOUNTER — Inpatient Hospital Stay: Payer: Medicare PPO | Admitting: Nurse Practitioner

## 2022-05-25 ENCOUNTER — Other Ambulatory Visit: Payer: Self-pay | Admitting: Nurse Practitioner

## 2022-05-25 VITALS — BP 165/78 | HR 70

## 2022-05-25 VITALS — BP 156/81 | HR 70 | Temp 98.1°F | Resp 18 | Ht 66.0 in | Wt 227.8 lb

## 2022-05-25 DIAGNOSIS — Z5111 Encounter for antineoplastic chemotherapy: Secondary | ICD-10-CM | POA: Diagnosis not present

## 2022-05-25 DIAGNOSIS — C250 Malignant neoplasm of head of pancreas: Secondary | ICD-10-CM

## 2022-05-25 LAB — CMP (CANCER CENTER ONLY)
ALT: 36 U/L (ref 0–44)
AST: 34 U/L (ref 15–41)
Albumin: 3.5 g/dL (ref 3.5–5.0)
Alkaline Phosphatase: 117 U/L (ref 38–126)
Anion gap: 8 (ref 5–15)
BUN: 10 mg/dL (ref 8–23)
CO2: 29 mmol/L (ref 22–32)
Calcium: 9.1 mg/dL (ref 8.9–10.3)
Chloride: 101 mmol/L (ref 98–111)
Creatinine: 0.61 mg/dL (ref 0.44–1.00)
GFR, Estimated: >60 mL/min (ref >60)
Glucose, Bld: 339 mg/dL — ABNORMAL HIGH (ref 70–99)
Potassium: 4 mmol/L (ref 3.5–5.1)
Sodium: 138 mmol/L (ref 135–145)
Total Bilirubin: 1 mg/dL (ref 0.3–1.2)
Total Protein: 7.1 g/dL (ref 6.5–8.1)

## 2022-05-25 LAB — CBC WITH DIFFERENTIAL (CANCER CENTER ONLY)
Abs Immature Granulocytes: 0.01 10*3/uL (ref 0.00–0.07)
Basophils Absolute: 0.1 10*3/uL (ref 0.0–0.1)
Basophils Relative: 1 %
Eosinophils Absolute: 0.2 10*3/uL (ref 0.0–0.5)
Eosinophils Relative: 4 %
HCT: 30.6 % — ABNORMAL LOW (ref 36.0–46.0)
Hemoglobin: 9.9 g/dL — ABNORMAL LOW (ref 12.0–15.0)
Immature Granulocytes: 0 %
Lymphocytes Relative: 26 %
Lymphs Abs: 1.4 10*3/uL (ref 0.7–4.0)
MCH: 27.5 pg (ref 26.0–34.0)
MCHC: 32.4 g/dL (ref 30.0–36.0)
MCV: 85 fL (ref 80.0–100.0)
Monocytes Absolute: 0.8 10*3/uL (ref 0.1–1.0)
Monocytes Relative: 15 %
Neutro Abs: 2.9 10*3/uL (ref 1.7–7.7)
Neutrophils Relative %: 54 %
Platelet Count: 158 10*3/uL (ref 150–400)
RBC: 3.6 MIL/uL — ABNORMAL LOW (ref 3.87–5.11)
RDW: 13.6 % (ref 11.5–15.5)
WBC Count: 5.4 10*3/uL (ref 4.0–10.5)
nRBC: 0 % (ref 0.0–0.2)

## 2022-05-25 MED ORDER — POTASSIUM CHLORIDE CRYS ER 20 MEQ PO TBCR: 20.0000 meq | EXTENDED_RELEASE_TABLET | Freq: Every day | ORAL | 1 refills | Status: DC

## 2022-05-25 MED ORDER — HEPARIN SOD (PORK) LOCK FLUSH 100 UNIT/ML IV SOLN
500.0000 [IU] | Freq: Once | INTRAVENOUS | Status: AC | PRN
  Administered 2022-05-25: 500 [IU]

## 2022-05-25 MED ORDER — PANCRELIPASE (LIP-PROT-AMYL) 36000-114000 UNITS PO CPEP: 36000.0000 [IU] | ORAL_CAPSULE | Freq: Three times a day (TID) | ORAL | 1 refills | Status: DC

## 2022-05-25 MED ORDER — SODIUM CHLORIDE 0.9 % IV SOLN: Freq: Once | INTRAVENOUS | Status: AC

## 2022-05-25 MED ORDER — PROCHLORPERAZINE MALEATE 10 MG PO TABS
10.0000 mg | ORAL_TABLET | Freq: Once | ORAL | Status: AC
  Administered 2022-05-25: 10 mg via ORAL

## 2022-05-25 MED ORDER — PACLITAXEL PROTEIN-BOUND CHEMO INJECTION 100 MG
100.0000 mg/m2 | Freq: Once | INTRAVENOUS | Status: AC
  Administered 2022-05-25: 225 mg via INTRAVENOUS
  Filled 2022-05-25: qty 45

## 2022-05-25 MED ORDER — SODIUM CHLORIDE 0.9 % IV SOLN
1000.0000 mg/m2 | Freq: Once | INTRAVENOUS | Status: AC
  Administered 2022-05-25: 2242 mg via INTRAVENOUS
  Filled 2022-05-25: qty 52.6

## 2022-05-25 MED ORDER — PROCHLORPERAZINE MALEATE 10 MG PO TABS
ORAL_TABLET | ORAL | Status: AC
  Filled 2022-05-25: qty 1

## 2022-05-25 MED ORDER — SODIUM CHLORIDE 0.9% FLUSH
10.0000 mL | INTRAVENOUS | Status: DC | PRN
  Administered 2022-05-25: 10 mL

## 2022-05-25 NOTE — Progress Notes (Signed)
Formatting of this note is different from the original.  Images from the original note were not included.    [facility]  OFFICE PROGRESS NOTE    Diagnosis: Pancreas cancer    INTERVAL HISTORY:     Ms. Oyervides returns as scheduled.  She completed cycle 2 gemcitabine/Abraxane 05/11/2022.  She denies nausea/vomiting.  No mouth sores.  No diarrhea or constipation.  No fever or rash after treatment.  No change in baseline neuropathy symptoms.  Appetite is better.    Objective:    Vital signs in last 24 hours:    Blood pressure (!) 156/81, pulse 70, temperature 98.1 F (36.7 C), temperature source Oral, resp. rate 18, height 5' 6 (1.676 m), weight 227 lb 12.8 oz (103.3 kg), SpO2 100 %.      HEENT: No thrush or ulcers.  Resp: Lungs clear bilaterally.  Cardio: Regular rate and rhythm.  GI: No hepatosplenomegaly.  Vascular: No significant leg edema.  Neuro: Alert and oriented.  Skin: No rash.  Port-A-Cath without erythema, nontender.    Lab Results:    Lab Results   Component Value Date    WBC 5.4 05/25/2022    HGB 9.9 (L) 05/25/2022    HCT 30.6 (L) 05/25/2022    MCV 85.0 05/25/2022    PLT 158 05/25/2022    NEUTROABS 2.9 05/25/2022     Imaging:    No results found.    Medications: I have reviewed the patient's current medications.    Assessment/Plan:  Pancreas cancer  03/22/2022 CT abdomen/pelvis-ill-defined 3.6 x 2.5 cm hypodense mass in the uncinate process of the pancreas; possible associated acute pancreatitis; fatty liver; faint hypodense focus in the inferior right lobe of the liver too small to characterize; no intrahepatic biliary dilatation.    03/23/2022 MRI-hypoenhancing mass in the uncinate process/inferior head of the pancreas abutting the SMV with distortion of the SMV, less than 180 degree involvement by MR but with limited assessment; pancreatic duct narrowing/obstruction and early involvement of the distal common bile duct; subtle hepatic lesion in the right hepatic lobe; hazy mesentery may  reflect concomitant mesenteritis or lymphatic congestion; small lymph nodes adjacent to the masslike area in the head of the pancreas.  03/26/2022 upper EUS-mass identified in the pancreatic head and uncinate process, appearance consistent with adenocarcinoma.  FNA pancreas head showed malignant cells consistent with adenocarcinoma.  04/07/2022 CT abdomen/pelvis pancreatic protocol-adenocarcinoma within the pancreatic uncinate process, isolated right hepatic lobe lesion suspicious for metastatic disease, contact of the SMV by tumor over an approximately 180 degree span with no evidence of occlusion and no arterial involvement, nonspecific borderline enlarged portacaval lymph node, small bowel mesenteric nodes most likely related to mesenteric adenitis/panniculitis  Cycle 1 gemcitabine/Abraxane 04/27/2022  Cycle 2 gemcitabine/Abraxane 05/11/2022  Cycle 3 gemcitabine/Abraxane 05/25/2022  Diabetes with recent poor control  Diarrhea, typically after eating  Diabetic neuropathy  Hypertension  Hyperlipidemia  Leg edema-04/06/2022 bilateral lower extremities negative for DVT  Severe oropharyngeal candidiasis 05/03/2022-Diflucan  Biliary obstruction secondary to #1  ERCP 05/07/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 Vanmetre appears stable.  She has completed 2 cycles of gemcitabine/Abraxane.  She seems to be tolerating the chemotherapy well.  Performance status is better.  Plan to proceed with cycle 3 today as scheduled.    CBC and chemistry panel reviewed.  Labs adequate to proceed as above.    She will return for  lab, follow-up, cycle 4 gemcitabine/Abraxane in 2 weeks.  We are available to see her sooner if needed.    Lonna Cobb ANP/GNP-BC     05/25/2022   9:56 AM      Electronically signed by Rana Snare, NP at 05/25/2022 10:35 AM EDT

## 2022-05-25 NOTE — Progress Notes (Signed)
Patient seen by Ned Card NP today  Vitals are within treatment parameters. Provider is aware of SBP 156-no new orders are indicated at this time.  Labs reviewed by Ned Card NP and are within treatment parameters.  Per physician team, patient is ready for treatment and there are NO modifications to the treatment plan.

## 2022-05-25 NOTE — Patient Instructions (Signed)
[facility]   Discharge Instructions: Thank you for choosing [facility] to provide your oncology and hematology care.   If you have a lab appointment with the Cancer Center, please go directly to the Cancer Center and check in at the registration area.   Wear comfortable clothing and clothing appropriate for easy access to any Portacath or PICC line.   We strive to give you quality time with your provider. You may need to reschedule your appointment if you arrive late (15 or more minutes).  Arriving late affects you and other patients whose appointments are after yours.  Also, if you miss three or more appointments without notifying the office, you may be dismissed from the clinic at the provider's discretion.      For prescription refill requests, have your pharmacy contact our office and allow 72 hours for refills to be completed.    Today you received the following chemotherapy and/or immunotherapy agents Abraxane, Gemzar      To help prevent nausea and vomiting after your treatment, we encourage you to take your nausea medication as directed.  BELOW ARE SYMPTOMS THAT SHOULD BE REPORTED IMMEDIATELY: *FEVER GREATER THAN 100.4 F (38 C) OR HIGHER *CHILLS OR SWEATING *NAUSEA AND VOMITING THAT IS NOT CONTROLLED WITH YOUR NAUSEA MEDICATION *UNUSUAL SHORTNESS OF BREATH *UNUSUAL BRUISING OR BLEEDING *URINARY PROBLEMS (pain or burning when urinating, or frequent urination) *BOWEL PROBLEMS (unusual diarrhea, constipation, pain near the anus) TENDERNESS IN MOUTH AND THROAT WITH OR WITHOUT PRESENCE OF ULCERS (sore throat, sores in mouth, or a toothache) UNUSUAL RASH, SWELLING OR PAIN  UNUSUAL VAGINAL DISCHARGE OR ITCHING   Items with * indicate a potential emergency and should be followed up as soon as possible or go to the Emergency Department if any problems should occur.  Please show the CHEMOTHERAPY ALERT CARD or IMMUNOTHERAPY ALERT CARD at  check-in to the Emergency Department and triage nurse.  Should you have questions after your visit or need to cancel or reschedule your appointment, please contact [facility]  Dept: [phone]  and follow the prompts.  Office hours are 8:00 a.m. to 4:30 p.m. Monday - Friday. Please note that voicemails left after 4:00 p.m. may not be returned until the following business day.  We are closed weekends and major holidays. You have access to a nurse at all times for urgent questions. Please call the main number to the clinic Dept: [phone] and follow the prompts.   For any non-urgent questions, you may also contact your provider using MyChart. We now offer e-Visits for anyone 18 and older to request care online for non-urgent symptoms. For details visit [email or web address].   Also download the MyChart app! Go to the app store, search "MyChart", open the app, select [facility], and log in with your MyChart username and password.  Masks are optional in the cancer centers. If you would like for your care team to wear a mask while they are taking care of you, please let them know. You may have one support person who is at least 80 years old accompany you for your appointments.  Paclitaxel Nanoparticle Albumin-Bound Injection What is this medication? NANOPARTICLE ALBUMIN-BOUND PACLITAXEL (Na no PAHR ti kuhl al BYOO muhn-bound PAK li TAX el) treats some types of cancer. It works by slowing down the growth of cancer cells. This medicine may be used for other purposes; ask your health care provider or pharmacist if you have questions. COMMON BRAND   NAME(S): Abraxane What should I tell my care team before I take this medication? They need to know if you have any of these conditions: Liver disease Low white blood cell levels An unusual or allergic reaction to paclitaxel, albumin, other medications, foods, dyes, or preservatives If you or your partner are pregnant or  trying to get pregnant Breast-feeding How should I use this medication? This medication is injected into a vein. It is given by your care team in a hospital or clinic setting. Talk to your care team about the use of this medication in children. Special care may be needed. Overdosage: If you think you have taken too much of this medicine contact a poison control center or emergency room at once. NOTE: This medicine is only for you. Do not share this medicine with others. What if I miss a dose? Keep appointments for follow-up doses. It is important not to miss your dose. Call your care team if you are unable to keep an appointment. What may interact with this medication? Other medications may affect the way this medication works. Talk with your care team about all of the medications you take. They may suggest changes to your treatment plan to lower the risk of side effects and to make sure your medications work as intended. This list may not describe all possible interactions. Give your health care provider a list of all the medicines, herbs, non-prescription drugs, or dietary supplements you use. Also tell them if you smoke, drink alcohol, or use illegal drugs. Some items may interact with your medicine. What should I watch for while using this medication? Your condition will be monitored carefully while you are receiving this medication. You may need blood work while taking this medication. This medication may make you feel generally unwell. This is not uncommon as chemotherapy can affect healthy cells as well as cancer cells. Report any side effects. Continue your course of treatment even though you feel ill unless your care team tells you to stop. This medication can cause serious allergic reactions. To reduce the risk, your care team may give you other medications to take before receiving this one. Be sure to follow the directions from your care team. This medication may increase your risk of  getting an infection. Call your care team for advice if you get a fever, chills, sore throat, or other symptoms of a cold or flu. Do not treat yourself. Try to avoid being around people who are sick. This medication may increase your risk to bruise or bleed. Call your care team if you notice any unusual bleeding. Be careful brushing or flossing your teeth or using a toothpick because you may get an infection or bleed more easily. If you have any dental work done, tell your dentist you are receiving this medication. Talk to your care team if you or your partner may be pregnant. Serious birth defects can occur if you take this medication during pregnancy and for 6 months after the last dose. You will need a negative pregnancy test before starting this medication. Contraception is recommended while taking this medication and for 6 months after the last dose. Your care team can help you find the option that works for you. If your partner can get pregnant, use a condom during sex while taking this medication and for 3 months after the last dose. Do not breastfeed while taking this medication and for 2 weeks after the last dose. This medication may cause infertility. Talk to your care team   if you are concerned about your fertility. What side effects may I notice from receiving this medication? Side effects that you should report to your care team as soon as possible: Allergic reactions--skin rash, itching, hives, swelling of the face, lips, tongue, or throat Dry cough, shortness of breath or trouble breathing Infection--fever, chills, cough, sore throat, wounds that don't heal, pain or trouble when passing urine, general feeling of discomfort or being unwell Low red blood cell level--unusual weakness or fatigue, dizziness, headache, trouble breathing Pain, tingling, or numbness in the hands or feet Stomach pain, unusual weakness or fatigue, nausea, vomiting, diarrhea, or fever that lasts longer than  expected Unusual bruising or bleeding Side effects that usually do not require medical attention (report to your care team if they continue or are bothersome): Diarrhea Fatigue Hair loss Loss of appetite Nausea Vomiting This list may not describe all possible side effects. Call your doctor for medical advice about side effects. You may report side effects to FDA at 1-800-FDA-1088. Where should I keep my medication? This medication is given in a hospital or clinic. It will not be stored at home. NOTE: This sheet is a summary. It may not cover all possible information. If you have questions about this medicine, talk to your doctor, pharmacist, or health care provider.  2023 Elsevier/Gold Standard (2022-01-27 00:00:00)  Gemcitabine Injection What is this medication? GEMCITABINE (jem SYE ta been) treats some types of cancer. It works by slowing down the growth of cancer cells. This medicine may be used for other purposes; ask your health care provider or pharmacist if you have questions. COMMON BRAND NAME(S): Gemzar, Infugem What should I tell my care team before I take this medication? They need to know if you have any of these conditions: Blood disorders Infection Kidney disease Liver disease Lung or breathing disease, such as asthma or COPD Recent or ongoing radiation therapy An unusual or allergic reaction to gemcitabine, other medications, foods, dyes, or preservatives If you or your partner are pregnant or trying to get pregnant Breast-feeding How should I use this medication? This medication is injected into a vein. It is given by your care team in a hospital or clinic setting. Talk to your care team about the use of this medication in children. Special care may be needed. Overdosage: If you think you have taken too much of this medicine contact a poison control center or emergency room at once. NOTE: This medicine is only for you. Do not share this medicine with others. What  if I miss a dose? Keep appointments for follow-up doses. It is important not to miss your dose. Call your care team if you are unable to keep an appointment. What may interact with this medication? Interactions have not been studied. This list may not describe all possible interactions. Give your health care provider a list of all the medicines, herbs, non-prescription drugs, or dietary supplements you use. Also tell them if you smoke, drink alcohol, or use illegal drugs. Some items may interact with your medicine. What should I watch for while using this medication? Your condition will be monitored carefully while you are receiving this medication. This medication may make you feel generally unwell. This is not uncommon, as chemotherapy can affect healthy cells as well as cancer cells. Report any side effects. Continue your course of treatment even though you feel ill unless your care team tells you to stop. In some cases, you may be given additional medications to help with side effects. Follow   all directions for their use. This medication may increase your risk of getting an infection. Call your care team for advice if you get a fever, chills, sore throat, or other symptoms of a cold or flu. Do not treat yourself. Try to avoid being around people who are sick. This medication may increase your risk to bruise or bleed. Call your care team if you notice any unusual bleeding. Be careful brushing or flossing your teeth or using a toothpick because you may get an infection or bleed more easily. If you have any dental work done, tell your dentist you are receiving this medication. Avoid taking medications that contain aspirin, acetaminophen, ibuprofen, naproxen, or ketoprofen unless instructed by your care team. These medications may hide a fever. Talk to your care team if you or your partner wish to become pregnant or think you might be pregnant. This medication can cause serious birth defects if taken  during pregnancy and for 6 months after the last dose. A negative pregnancy test is required before starting this medication. A reliable form of contraception is recommended while taking this medication and for 6 months after the last dose. Talk to your care team about effective forms of contraception. Do not father a child while taking this medication and for 3 months after the last dose. Use a condom while having sex during this time period. Do not breastfeed while taking this medication and for at least 1 week after the last dose. This medication may cause infertility. Talk to your care team if you are concerned about your fertility. What side effects may I notice from receiving this medication? Side effects that you should report to your care team as soon as possible: Allergic reactions--skin rash, itching, hives, swelling of the face, lips, tongue, or throat Capillary leak syndrome--stomach or muscle pain, unusual weakness or fatigue, feeling faint or lightheaded, decrease in the amount of urine, swelling of the ankles, hands, or feet, trouble breathing Infection--fever, chills, cough, sore throat, wounds that don't heal, pain or trouble when passing urine, general feeling of discomfort or being unwell Liver injury--right upper belly pain, loss of appetite, nausea, light-colored stool, dark yellow or brown urine, yellowing skin or eyes, unusual weakness or fatigue Low red blood cell level--unusual weakness or fatigue, dizziness, headache, trouble breathing Lung injury--shortness of breath or trouble breathing, cough, spitting up blood, chest pain, fever Stomach pain, bloody diarrhea, pale skin, unusual weakness or fatigue, decrease in the amount of urine, which may be signs of hemolytic uremic syndrome Sudden and severe headache, confusion, change in vision, seizures, which may be signs of posterior reversible encephalopathy syndrome (PRES) Unusual bruising or bleeding Side effects that usually do  not require medical attention (report to your care team if they continue or are bothersome): Diarrhea Drowsiness Hair loss Nausea Pain, redness, or swelling with sores inside the mouth or throat Vomiting This list may not describe all possible side effects. Call your doctor for medical advice about side effects. You may report side effects to FDA at 1-800-FDA-1088. Where should I keep my medication? This medication is given in a hospital or clinic. It will not be stored at home. NOTE: This sheet is a summary. It may not cover all possible information. If you have questions about this medicine, talk to your doctor, pharmacist, or health care provider.  2023 Elsevier/Gold Standard (2022-01-27 00:00:00)  

## 2022-05-25 NOTE — Progress Notes (Signed)
  [facility] OFFICE PROGRESS NOTE   Diagnosis: Pancreas cancer  INTERVAL HISTORY:   [name] returns as scheduled.  She completed cycle 2 gemcitabine/Abraxane 05/11/2022.  She denies nausea/vomiting.  No mouth sores.  No diarrhea or constipation.  No fever or rash after treatment.  No change in baseline neuropathy symptoms.  Appetite is better.  Objective:  Vital signs in last 24 hours:  Blood pressure (!) 156/81, pulse 70, temperature 98.1 F (36.7 C), temperature source Oral, resp. rate 18, height '5\' 6"'$  (1.676 m), weight 227 lb 12.8 oz (103.3 kg), SpO2 100 %.    HEENT: No thrush or ulcers. Resp: Lungs clear bilaterally. Cardio: Regular rate and rhythm. GI: No hepatosplenomegaly. Vascular: No significant leg edema. Neuro: Alert and oriented. Skin: No rash. Port-A-Cath without erythema, nontender.   Lab Results:  Lab Results  Component Value Date   WBC 5.4 05/25/2022   HGB 9.9 (L) 05/25/2022   HCT 30.6 (L) 05/25/2022   MCV 85.0 05/25/2022   PLT 158 05/25/2022   NEUTROABS 2.9 05/25/2022    Imaging:  No results found.  Medications: I have reviewed the patient's current medications.  Assessment/Plan: Pancreas cancer 03/22/2022 CT abdomen/pelvis-ill-defined 3.6 x 2.5 cm hypodense mass in the uncinate process of the pancreas; possible associated acute pancreatitis; fatty liver; faint hypodense focus in the inferior right lobe of the liver too small to characterize; no intrahepatic biliary dilatation.   03/23/2022 MRI-hypoenhancing mass in the uncinate process/inferior head of the pancreas abutting the SMV with distortion of the SMV, less than 180 degree involvement by MR but with limited assessment; pancreatic duct narrowing/obstruction and early involvement of the distal common bile duct; subtle hepatic lesion in the right hepatic lobe; hazy mesentery may reflect concomitant mesenteritis or lymphatic congestion; small lymph nodes adjacent to the masslike area  in the head of the pancreas. 03/26/2022 upper EUS-mass identified in the pancreatic head and uncinate process, appearance consistent with adenocarcinoma.  FNA pancreas head showed malignant cells consistent with adenocarcinoma. 04/07/2022 CT abdomen/pelvis pancreatic protocol-adenocarcinoma within the pancreatic uncinate process, isolated right hepatic lobe lesion suspicious for metastatic disease, contact of the SMV by tumor over an approximately 180 degree span with no evidence of occlusion and no arterial involvement, nonspecific borderline enlarged portacaval lymph node, small bowel mesenteric nodes most likely related to mesenteric adenitis/panniculitis Cycle 1 gemcitabine/Abraxane 04/27/2022 Cycle 2 gemcitabine/Abraxane 05/11/2022 Cycle 3 gemcitabine/Abraxane 05/25/2022 Diabetes with recent poor control Diarrhea, typically after eating Diabetic neuropathy Hypertension Hyperlipidemia Leg edema-04/06/2022 bilateral lower extremities negative for DVT Severe oropharyngeal candidiasis 05/03/2022-Diflucan 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  She has completed 2 cycles of gemcitabine/Abraxane.  She seems to be tolerating the chemotherapy well.  Performance status is better.  Plan to proceed with cycle 3 today as scheduled.  CBC and chemistry panel reviewed.  Labs adequate to proceed as above.  She will return for lab, follow-up, cycle 4 gemcitabine/Abraxane in 2 weeks.  We are available to see her sooner if needed.    Ned Card ANP/GNP-BC   05/25/2022  9:56 AM

## 2022-05-26 LAB — CANCER ANTIGEN 19-9: CA 19-9: 573 U/mL — ABNORMAL HIGH (ref 0–35)

## 2022-05-31 ENCOUNTER — Ambulatory Visit: Payer: Self-pay

## 2022-06-01 ENCOUNTER — Ambulatory Visit: Payer: Medicare PPO | Admitting: Podiatry

## 2022-06-01 ENCOUNTER — Encounter: Payer: Self-pay | Admitting: Podiatry

## 2022-06-01 DIAGNOSIS — M79675 Pain in left toe(s): Secondary | ICD-10-CM

## 2022-06-01 DIAGNOSIS — E1142 Type 2 diabetes mellitus with diabetic polyneuropathy: Secondary | ICD-10-CM

## 2022-06-01 DIAGNOSIS — M79674 Pain in right toe(s): Secondary | ICD-10-CM | POA: Diagnosis not present

## 2022-06-01 DIAGNOSIS — B351 Tinea unguium: Secondary | ICD-10-CM | POA: Diagnosis not present

## 2022-06-01 NOTE — Progress Notes (Signed)
This patient presents  to my office for at risk foot care.  This patient requires this care by a professional since this patient will be at risk due to having diabetes.  This patient is unable to cut nails herself since the patient cannot reach her nails.These nails are painful walking and wearing shoes.  This patient presents for at risk foot care today.  General Appearance  Alert, conversant and in no acute stress.  Vascular  Dorsalis pedis   pulses are weakly  palpable  Bilaterally. Posterior tibial pulses absent due to swelling.  Capillary return is within normal limits  bilaterally. Cold feet  Bilaterally.Absent pedal hair . Venous stasis  B/L.  Neurologic  Senn-Weinstein monofilament wire test within normal limits  bilaterally. Muscle power within normal limits bilaterally.  Nails Thick disfigured discolored nails with subungual debris  from hallux to fifth toes bilaterally. No evidence of bacterial infection or drainage bilaterally. Pincer hallux toenails  Orthopedic  No limitations of motion  feet .  No crepitus or effusions noted.  No bony pathology or digital deformities noted.  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  Skin  normotropic skin with no porokeratosis noted bilaterally.  No signs of infections or ulcers noted.     Onychomycosis  Pain in right toes  Pain in left toes  Consent was obtained for treatment procedures.   Mechanical debridement of nails 1-5  bilaterally performed with a nail nipper.  Filed with dremel without incident.    Return office visit  3 months                    Told patient to return for periodic foot care and evaluation due to potential at risk complications.   Gardiner Barefoot DPM

## 2022-06-02 ENCOUNTER — Other Ambulatory Visit: Payer: Self-pay

## 2022-06-02 ENCOUNTER — Other Ambulatory Visit: Payer: Self-pay | Admitting: Pharmacist

## 2022-06-02 ENCOUNTER — Other Ambulatory Visit (HOSPITAL_BASED_OUTPATIENT_CLINIC_OR_DEPARTMENT_OTHER): Payer: Self-pay

## 2022-06-02 DIAGNOSIS — E119 Type 2 diabetes mellitus without complications: Secondary | ICD-10-CM

## 2022-06-02 DIAGNOSIS — R739 Hyperglycemia, unspecified: Secondary | ICD-10-CM

## 2022-06-02 DIAGNOSIS — E1159 Type 2 diabetes mellitus with other circulatory complications: Secondary | ICD-10-CM

## 2022-06-02 MED ORDER — INSULIN GLARGINE 100 UNIT/ML SOLOSTAR PEN: 28.0000 [IU] | PEN_INJECTOR | Freq: Every day | SUBCUTANEOUS | 11 refills | Status: DC

## 2022-06-02 NOTE — Chronic Care Management (AMB) (Unsigned)
Chief Complaint  Patient presents with   Diabetes    Sarah Davies is a 81 y.o. year old female who presented for a telephone visit.   They were referred to the pharmacist by their PCP for assistance in managing diabetes.    Subjective:  Care Team: Primary Care Provider: Ivonne Andrew, NP ; Next Scheduled Visit: *** {careteamprovider:27366}  Medication Access/Adherence  Current Pharmacy:  [id number] [place], [place] - [address] [place] [address] Phone: [phone] Fax: [phone]  [facility] [address] [place] [address] Phone: [phone] Fax: [phone]   Patient reports affordability concerns with their medications: {YES/NO:21197} Patient reports access/transportation concerns to their pharmacy: {YES/NO:21197} Patient reports adherence concerns with their medications:  {YES/NO:21197} ***   {Pharmacy S/O Choices:26420}   Health Maintenance  Health Maintenance Due  Topic Date Due   OPHTHALMOLOGY EXAM  Never done   TETANUS/TDAP  Never done   Zoster Vaccines- Shingrix (1 of 2) Never done   DEXA SCAN  Never done   Pneumonia Vaccine 57+ Years old (2 - PPSV23 or PCV20) 07/27/2017   COVID-19 Vaccine (3 - Pfizer risk series) 01/30/2020     Objective: Lab Results  Component Value Date   HGBA1C 13.0 (H) 03/22/2022    Lab Results  Component Value Date   CREATININE 0.61 05/25/2022   BUN 10 05/25/2022   NA 138 05/25/2022   K 4.0 05/25/2022   CL 101 05/25/2022   CO2 29 05/25/2022    No results found for: "CHOL", "HDL", "LDLCALC", "LDLDIRECT", "TRIG", "CHOLHDL"  Medications Reviewed Today     Reviewed by Helane Gunther, DPM (Physician) on 06/01/22 at 1451  Med List Status: <None>   Medication Order Taking? Sig Documenting Provider Last Dose Status Informant  ACCU-CHEK GUIDE test strip [id number] No PLEASE SEE ATTACHED FOR DETAILED DIRECTIONS [provider] Taking Active Self  Accu-Chek Softclix Lancets lancets [id number] No 4 (four) times daily. [provider] Taking Active Self  acetaminophen (TYLENOL) 500 MG tablet [id number] No Take 1,000 mg by mouth every 6 (six) hours as needed for moderate pain. [provider] Taking Active Self  amLODipine (NORVASC) 10 MG tablet [id number] No Take 1 tablet (10 mg total) by mouth daily.  Patient not taking: Reported on 05/25/2022   Ivonne Andrew, NP Not Taking Active Self  b complex vitamins tablet [id number] No Take 1 tablet by mouth daily. [provider] Taking Active Self  blood glucose meter kit and supplies [id number] No Dispense based on patient and insurance preference. Use up to four times daily as directed. (FOR ICD-10 E10.9, E11.9). Regalado, Prentiss Bells, MD Taking Active Self  calcium carbonate (OS-CAL) 600 MG TABS [id number] No Take 600 mg by mouth 2 (two) times daily with a meal. [provider] Taking Active Self  carvedilol (COREG) 25 MG tablet [id number] No Take 50 mg by mouth 2 (two) times daily with a meal. [provider] Taking Active Self  cholecalciferol (VITAMIN D) 1000 UNITS tablet [id number] No Take 1,000 Units by mouth daily. [provider] Taking Active Self  Continuous Blood Gluc Receiver (FREESTYLE LIBRE 2 READER) DEVI [id number] No Use to check blood sugar continuously - sent to DME supplier [employer] [phone] Ivonne Andrew, NP Taking Active Self  Continuous Blood Gluc Sensor (FREESTYLE LIBRE 2 SENSOR) MISC [id number] No Use to check blood sugar continuously - sent to DME supplier [employer] [phone] Andrew, NP Taking Active Self  doxazosin (CARDURA) 4 MG tablet [id number] No Take 4 mg by mouth at bedtime.  [provider] Taking Active Self  fluconazole (DIFLUCAN) 100 MG tablet [id number] No Take 200 mg by mouth today. Then 100 mg by mouth daily for 5 days. Ladene Artist, MD Taking Active Self  furosemide (LASIX) 20 MG tablet [id number] No Take 20 mg by mouth daily.  [provider] Taking Active Self  glipiZIDE (GLUCOTROL XL) 5 MG 24 hr tablet [id number] No Take 7.5 mg by mouth daily. [provider] Taking Active Self  insulin glargine (LANTUS) 100 UNIT/ML Solostar Pen [id number] No Inject 22 Units into the skin daily. Ivonne Andrew, NP Taking Active Self  Insulin Pen Needle (PEN NEEDLES 3/16") 31G X 5 MM MISC [id number] No 1 Application by Does not apply route daily. Regalado, Prentiss Bells, MD Taking Active Self  lidocaine-prilocaine (EMLA) cream [id number] No Apply 1 Application topically as needed. Ladene Artist, MD Taking Active Self  lipase/protease/amylase (CREON) 36000 UNITS CPEP capsule [id number]  Take 1 capsule (36,000 Units total) by mouth 3 (three) times daily before meals. Rana Snare, NP  Active   lisinopril (ZESTRIL) 20 MG tablet [id number] No Take 20 mg by mouth daily. [provider] Taking Active Self  Magnesium 500 MG TABS [id number] No Take 500 mg by mouth daily. [provider] Taking Active Self  ondansetron (ZOFRAN) 4 MG tablet [id number] No Take 1 tablet (4 mg total) by mouth every 6 (six) hours as needed for nausea. Regalado, Prentiss Bells, MD Taking Active Self  potassium chloride SA (KLOR-CON M) 20 MEQ tablet [id number]  Take 1 tablet (20 mEq total) by mouth daily. Rana Snare, NP  Active   prochlorperazine (COMPAZINE) 10 MG tablet [id number] No Take 1 tablet (10 mg total) by mouth every 6 (six) hours as needed for nausea or vomiting. Ladene Artist, MD Taking Active Self  prochlorperazine (COMPAZINE) 10 MG tablet [id number]     Active               Assessment/Plan:   {Pharmacy A/P Choices:26421}  Follow Up Plan: ***  ***

## 2022-06-02 NOTE — Patient Instructions (Signed)
Sarah Davies,   See if your family can help you download the "[place] 2" app on your phone.   When you place a new sensor, if you start it with the app on your phone rather than the reader you have been using, we can eventually connect you to the [facility] account - this means that anytime you and I have an appointment or you have an appointment at the clinic, we can easily see all of your blood sugar readings.   When you do get the app downloaded and you do start a new sensor if you open the app, in the top left hand corner click the 3 horizontal lines for settings -[place] to a Clinic -> our clinic ID is "[id number]" -> Connect.   Increase Lantus to 28 units daily. Please call with any questions!  Catie Hedwig Morton, PharmD, [facility] [phone]

## 2022-06-02 NOTE — Chronic Care Management (AMB) (Signed)
Chief Complaint  Patient presents with   Diabetes    Sarah Davies is a 81 y.o. year old female who presented for a telephone visit.   They were referred to the pharmacist by their PCP for assistance in managing diabetes.   Subjective:  Care Team: Primary Care Provider: Ivonne Andrew, NP ; Next Scheduled Visit: 06/10/22  Medication Access/Adherence  Current Pharmacy:  [id number] [place], [place] - [address] [place] [address] Phone: [phone] Fax: [phone]  [facility] [address] [place] [address] Phone: [phone] Fax: [phone]   Patient reports affordability concerns with their medications: No  Patient reports access/transportation concerns to their pharmacy: No  Patient reports adherence concerns with their medications:  No     Diabetes:  Current medications: Lantus 22 units daily, glipizide XL 7.5 mg daily Medications tried in the past:   Patient reports she has been wearing the [place] 2 for 2 weeks now, sensor ends today. Notes that her aid placed the first one and she isn't sure how to remove it or place the next one. She is using the [place] 2 reader, but does report that she has an Special educational needs teacher  Date of Download: last 14 days  Average Glucose: 356 mg/dL - midnight - 6 am: 130;  6 am - noon: 309 Noon - 6 pm: 363 6 pm - midnight: 394  Time in Goal:  - Time in range 70-180: 100% - Time above range: 0 % - Time below range: 0%  Patient denies hypoglycemic s/sx including dizziness, shakiness, sweating.    Health Maintenance  Health Maintenance Due  Topic Date Due   OPHTHALMOLOGY EXAM  Never done   TETANUS/TDAP  Never done   Zoster Vaccines- Shingrix (1 of 2) Never done   DEXA SCAN  Never done   Pneumonia Vaccine 62+ Years old (2 - PPSV23 or PCV20) 07/27/2017   COVID-19 Vaccine (3 - Pfizer risk series) 01/30/2020     Objective: Lab  Results  Component Value Date   HGBA1C 13.0 (H) 03/22/2022    Lab Results  Component Value Date   CREATININE 0.61 05/25/2022   BUN 10 05/25/2022   NA 138 05/25/2022   K 4.0 05/25/2022   CL 101 05/25/2022   CO2 29 05/25/2022    No results found for: "CHOL", "HDL", "LDLCALC", "LDLDIRECT", "TRIG", "CHOLHDL"  Medications Reviewed Today     Reviewed by Alden Hipp, RPH-CPP (Pharmacist) on 06/02/22 at 1057  Med List Status: <None>   Medication Order Taking? Sig Documenting Provider Last Dose Status Informant  ACCU-CHEK GUIDE test strip [id number] No PLEASE SEE ATTACHED FOR DETAILED DIRECTIONS [provider] Taking Active Self  Accu-Chek Softclix Lancets lancets [id number] No 4 (four) times daily. [provider] Taking Active Self  acetaminophen (TYLENOL) 500 MG tablet [id number] No Take 1,000 mg by mouth every 6 (six) hours as needed for moderate pain. [provider] Taking Active Self  amLODipine (NORVASC) 10 MG tablet [id number] No Take 1 tablet (10 mg total) by mouth daily.  Patient not taking: Reported on 05/25/2022   Ivonne Andrew, NP Not Taking Active Self  b complex vitamins tablet [id number] No Take 1 tablet by mouth daily. [provider] Taking Active Self  blood glucose meter kit and supplies [id number] No Dispense based on patient and insurance preference. Use up to four times daily as directed. (FOR ICD-10 E10.9, E11.9). Regalado, Prentiss Bells, MD  Taking Active Self  calcium carbonate (OS-CAL) 600 MG TABS [id number] No Take 600 mg by mouth 2 (two) times daily with a meal. [provider] Taking Active Self  carvedilol (COREG) 25 MG tablet [id number] No Take 50 mg by mouth 2 (two) times daily with a meal. [provider] Taking Active Self  cholecalciferol (VITAMIN D) 1000 UNITS tablet [id number] No Take 1,000 Units by mouth daily. [provider] Taking Active Self  Continuous Blood Gluc Receiver (FREESTYLE LIBRE 2 READER)  DEVI [id number] No Use to check blood sugar continuously - sent to DME supplier [employer] [phone]) Fenton Foy, NP Taking Active Self  Continuous Blood Gluc Sensor (FREESTYLE LIBRE 2 SENSOR) MISC [id number] No Use to check blood sugar continuously - sent to DME supplier [employer] [phone]) Fenton Foy, NP Taking Active Self  doxazosin (CARDURA) 4 MG tablet [id number] No Take 4 mg by mouth at bedtime.  [provider] Taking Active Self  fluconazole (DIFLUCAN) 100 MG tablet [id number] No Take 200 mg by mouth today. Then 100 mg by mouth daily for 5 days. Ladell Pier, MD Taking Active Self  furosemide (LASIX) 20 MG tablet [id number] No Take 20 mg by mouth daily.  [provider] Taking Active Self  glipiZIDE (GLUCOTROL XL) 5 MG 24 hr tablet [id number] No Take 7.5 mg by mouth daily. [provider] Taking Active Self  insulin glargine (LANTUS) 100 UNIT/ML Solostar Pen [id number]  Inject 28 Units into the skin daily. Fenton Foy, NP  Active   Insulin Pen Needle (PEN NEEDLES 3/16") 31G X 5 MM MISC [id number] No 1 Application by Does not apply route daily. Regalado, Cassie Freer, MD Taking Active Self  lidocaine-prilocaine (EMLA) cream [id number] No Apply 1 Application topically as needed. Ladell Pier, MD Taking Active Self  lipase/protease/amylase (CREON) 36000 UNITS CPEP capsule [id number]  Take 1 capsule (36,000 Units total) by mouth 3 (three) times daily before meals. Owens Shark, NP  Active   lisinopril (ZESTRIL) 20 MG tablet [id number] No Take 20 mg by mouth daily. [provider] Taking Active Self  Magnesium 500 MG TABS [id number] No Take 500 mg by mouth daily. [provider] Taking Active Self  ondansetron (ZOFRAN) 4 MG tablet [id number] No Take 1 tablet (4 mg total) by mouth every 6 (six) hours as needed for nausea. Regalado, Cassie Freer, MD Taking Active Self  potassium chloride SA (KLOR-CON M) 20 MEQ tablet  [id number]  Take 1 tablet (20 mEq total) by mouth daily. Owens Shark, NP  Active   prochlorperazine (COMPAZINE) 10 MG tablet [id number] No Take 1 tablet (10 mg total) by mouth every 6 (six) hours as needed for nausea or vomiting. Ladell Pier, MD Taking Active Self  prochlorperazine (COMPAZINE) 10 MG tablet [id number]     Active               Assessment/Plan:   Diabetes: - Currently uncontrolled - Recommend to increase Lantus to 28 units daily. Discussed with PCP, she is in agreement. - Discussed use of Libre 2 app on her phone to be able to connect glucose readings to LibreView so I can view them remotely. She will collaborate with the team at [facility] on Tuesday to help her place a new sensor and hopefully connect on the [place] 2 app on her phone. She also would like to transfer scripts to pharmacy at [facility] moving forward. I will collaborate with the pharmacy on that.  Follow Up Plan: phone call in 2 weeks  Catie TJodi Mourning, PharmD, [facility] [phone]

## 2022-06-06 ENCOUNTER — Other Ambulatory Visit: Payer: Self-pay | Admitting: Oncology

## 2022-06-07 ENCOUNTER — Encounter: Payer: Self-pay | Admitting: Oncology

## 2022-06-07 ENCOUNTER — Other Ambulatory Visit (HOSPITAL_BASED_OUTPATIENT_CLINIC_OR_DEPARTMENT_OTHER): Payer: Self-pay

## 2022-06-07 ENCOUNTER — Other Ambulatory Visit: Payer: Self-pay

## 2022-06-07 ENCOUNTER — Telehealth: Payer: Self-pay

## 2022-06-07 DIAGNOSIS — C250 Malignant neoplasm of head of pancreas: Secondary | ICD-10-CM

## 2022-06-07 MED ORDER — PANCRELIPASE (LIP-PROT-AMYL) 36000-114000 UNITS PO CPEP
36000.0000 [IU] | ORAL_CAPSULE | Freq: Three times a day (TID) | ORAL | 1 refills | Status: DC
  Filled 2022-06-07: qty 260, 87d supply, fill #0
  Filled 2022-06-22: qty 100, 34d supply, fill #1

## 2022-06-07 NOTE — Telephone Encounter (Signed)
[facility]. I let the patient know I send the new prescription to [facility]. Patient verbal understanding and had no further questions or concerns

## 2022-06-08 ENCOUNTER — Inpatient Hospital Stay: Payer: Medicare PPO | Admitting: Oncology

## 2022-06-08 ENCOUNTER — Encounter: Payer: Self-pay | Admitting: *Deleted

## 2022-06-08 ENCOUNTER — Inpatient Hospital Stay: Payer: Medicare PPO

## 2022-06-08 ENCOUNTER — Other Ambulatory Visit (HOSPITAL_BASED_OUTPATIENT_CLINIC_OR_DEPARTMENT_OTHER): Payer: Self-pay

## 2022-06-08 ENCOUNTER — Other Ambulatory Visit: Payer: Self-pay

## 2022-06-08 ENCOUNTER — Encounter (HOSPITAL_COMMUNITY): Payer: Self-pay

## 2022-06-08 ENCOUNTER — Emergency Department (HOSPITAL_COMMUNITY): Payer: Medicare PPO

## 2022-06-08 ENCOUNTER — Emergency Department (HOSPITAL_COMMUNITY)
Admission: EM | Admit: 2022-06-08 | Discharge: 2022-06-09 | Disposition: A | Discharge status: Home / Self Care | Payer: Medicare PPO | Attending: Emergency Medicine | Admitting: Emergency Medicine

## 2022-06-08 VITALS — BP 147/75 | HR 70

## 2022-06-08 DIAGNOSIS — R61 Generalized hyperhidrosis: Secondary | ICD-10-CM | POA: Diagnosis not present

## 2022-06-08 DIAGNOSIS — R509 Fever, unspecified: Secondary | ICD-10-CM

## 2022-06-08 DIAGNOSIS — R059 Cough, unspecified: Secondary | ICD-10-CM | POA: Insufficient documentation

## 2022-06-08 DIAGNOSIS — I10 Essential (primary) hypertension: Secondary | ICD-10-CM | POA: Diagnosis not present

## 2022-06-08 DIAGNOSIS — R791 Abnormal coagulation profile: Secondary | ICD-10-CM | POA: Diagnosis not present

## 2022-06-08 DIAGNOSIS — K529 Noninfective gastroenteritis and colitis, unspecified: Secondary | ICD-10-CM | POA: Insufficient documentation

## 2022-06-08 DIAGNOSIS — Z794 Long term (current) use of insulin: Secondary | ICD-10-CM | POA: Insufficient documentation

## 2022-06-08 DIAGNOSIS — D649 Anemia, unspecified: Secondary | ICD-10-CM | POA: Diagnosis not present

## 2022-06-08 DIAGNOSIS — R3915 Urgency of urination: Secondary | ICD-10-CM | POA: Diagnosis not present

## 2022-06-08 DIAGNOSIS — C250 Malignant neoplasm of head of pancreas: Secondary | ICD-10-CM

## 2022-06-08 DIAGNOSIS — Z79899 Other long term (current) drug therapy: Secondary | ICD-10-CM | POA: Insufficient documentation

## 2022-06-08 DIAGNOSIS — R7401 Elevation of levels of liver transaminase levels: Secondary | ICD-10-CM

## 2022-06-08 DIAGNOSIS — E119 Type 2 diabetes mellitus without complications: Secondary | ICD-10-CM | POA: Diagnosis not present

## 2022-06-08 HISTORY — DX: Malignant neoplasm of pancreas, unspecified: C25.9

## 2022-06-08 LAB — CMP (CANCER CENTER ONLY)
ALT: 22 U/L (ref 0–44)
AST: 25 U/L (ref 15–41)
Albumin: 3.5 g/dL (ref 3.5–5.0)
Alkaline Phosphatase: 78 U/L (ref 38–126)
Anion gap: 7 (ref 5–15)
BUN: 10 mg/dL (ref 8–23)
CO2: 27 mmol/L (ref 22–32)
Calcium: 9 mg/dL (ref 8.9–10.3)
Chloride: 103 mmol/L (ref 98–111)
Creatinine: 0.65 mg/dL (ref 0.44–1.00)
GFR, Estimated: >60 mL/min (ref >60)
Glucose, Bld: 230 mg/dL — ABNORMAL HIGH (ref 70–99)
Potassium: 4 mmol/L (ref 3.5–5.1)
Sodium: 137 mmol/L (ref 135–145)
Total Bilirubin: 0.8 mg/dL (ref 0.3–1.2)
Total Protein: 7.1 g/dL (ref 6.5–8.1)

## 2022-06-08 LAB — CBC WITH DIFFERENTIAL (CANCER CENTER ONLY)
Abs Immature Granulocytes: 0.01 10*3/uL (ref 0.00–0.07)
Basophils Absolute: 0 10*3/uL (ref 0.0–0.1)
Basophils Relative: 1 %
Eosinophils Absolute: 0.2 10*3/uL (ref 0.0–0.5)
Eosinophils Relative: 4 %
HCT: 31.6 % — ABNORMAL LOW (ref 36.0–46.0)
Hemoglobin: 10.2 g/dL — ABNORMAL LOW (ref 12.0–15.0)
Immature Granulocytes: 0 %
Lymphocytes Relative: 25 %
Lymphs Abs: 1.1 10*3/uL (ref 0.7–4.0)
MCH: 27.6 pg (ref 26.0–34.0)
MCHC: 32.3 g/dL (ref 30.0–36.0)
MCV: 85.6 fL (ref 80.0–100.0)
Monocytes Absolute: 0.7 10*3/uL (ref 0.1–1.0)
Monocytes Relative: 17 %
Neutro Abs: 2.3 10*3/uL (ref 1.7–7.7)
Neutrophils Relative %: 53 %
Platelet Count: 173 10*3/uL (ref 150–400)
RBC: 3.69 MIL/uL — ABNORMAL LOW (ref 3.87–5.11)
RDW: 15 % (ref 11.5–15.5)
WBC Count: 4.3 10*3/uL (ref 4.0–10.5)
nRBC: 0 % (ref 0.0–0.2)

## 2022-06-08 MED ORDER — PACLITAXEL PROTEIN-BOUND CHEMO INJECTION 100 MG
100.0000 mg/m2 | Freq: Once | INTRAVENOUS | Status: AC
  Administered 2022-06-08: 225 mg via INTRAVENOUS
  Filled 2022-06-08: qty 45

## 2022-06-08 MED ORDER — SODIUM CHLORIDE 0.9 % IV SOLN: Freq: Once | INTRAVENOUS | Status: AC

## 2022-06-08 MED ORDER — PROCHLORPERAZINE MALEATE 10 MG PO TABS
10.0000 mg | ORAL_TABLET | Freq: Once | ORAL | Status: AC
  Administered 2022-06-08: 10 mg via ORAL
  Filled 2022-06-08: qty 1

## 2022-06-08 MED ORDER — SODIUM CHLORIDE 0.9% FLUSH
10.0000 mL | INTRAVENOUS | Status: DC | PRN
  Administered 2022-06-08: 10 mL

## 2022-06-08 MED ORDER — HEPARIN SOD (PORK) LOCK FLUSH 100 UNIT/ML IV SOLN
500.0000 [IU] | Freq: Once | INTRAVENOUS | Status: AC | PRN
  Administered 2022-06-08: 500 [IU]

## 2022-06-08 MED ORDER — SODIUM CHLORIDE 0.9 % IV SOLN
1000.0000 mg/m2 | Freq: Once | INTRAVENOUS | Status: AC
  Administered 2022-06-08: 2166 mg via INTRAVENOUS
  Filled 2022-06-08: qty 52.6

## 2022-06-08 NOTE — ED Triage Notes (Signed)
Formatting of this note might be different from the original.  Pt reports with a fever and weakness after having chemo today. Pt states that she was hard to awaken after taking her nap.  Electronically signed by Margie Billet, RN at 06/08/2022 11:06 PM EDT

## 2022-06-08 NOTE — ED Provider Notes (Signed)
Formatting of this note is different from the original.  Images from the original note were not included.    [facility]  Provider Note    CSN: [id number]  Arrival date & time: 06/08/22  2254        History    Chief Complaint   Patient presents with    Fever    Weakness     Lynn Blackwell is a 81 y.o. female.    The history is provided by the patient and a relative.   Fever  Weakness  Associated symptoms: fever    She has history hypertension, diabetes, hyperlipidemia, pancreatic cancer and had chemotherapy infusion earlier today of paclitaxel and gemcitabine and comes in because of fever and difficulty arousing.  Temperature at home was reported to be 102.  She has had some chills and sweats.  She has had cough for several days which is unchanged.  Cough is nonproductive.  She denies rhinorrhea, sore throat.  She denies nausea or vomiting.  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  She has had urinary urgency but no dysuria.  She denies arthralgias or myalgias.  She feels that she is back to her baseline.  Of note, she has not taken any antipyretics.    Home Medications  Prior to Admission medications    Medication Sig Start Date End Date Taking? Authorizing Provider   ACCU-CHEK GUIDE test strip PLEASE SEE ATTACHED FOR DETAILED DIRECTIONS 03/24/22   [provider]   Accu-Chek Softclix Lancets lancets 4 (four) times daily. 03/24/22   [provider]   acetaminophen (TYLENOL) 500 MG tablet Take 1,000 mg by mouth every 6 (six) hours as needed for moderate pain.    [provider]   amLODipine (NORVASC) 10 MG tablet Take 1 tablet (10 mg total) by mouth daily. 04/19/22 07/18/22  Ivonne Andrew, NP   b complex vitamins tablet Take 1 tablet by mouth daily.    [provider]   blood glucose meter kit and supplies Dispense based on patient and insurance preference. Use up to four times daily as directed. (FOR ICD-10 E10.9, E11.9). 03/24/22   Regalado,  Belkys A, MD   calcium carbonate (OS-CAL) 600 MG TABS Take 600 mg by mouth 2 (two) times daily with a meal.    [provider]   carvedilol (COREG) 25 MG tablet Take 50 mg by mouth 2 (two) times daily with a meal. 03/21/13   [provider]   cholecalciferol (VITAMIN D) 1000 UNITS tablet Take 1,000 Units by mouth daily.    [provider]   Continuous Blood Gluc Receiver (FREESTYLE LIBRE 2 READER) DEVI Use to check blood sugar continuously - sent to DME supplier [employer] [phone] 04/29/22   Ivonne Andrew, NP   Continuous Blood Gluc Sensor (FREESTYLE LIBRE 2 SENSOR) MISC Use to check blood sugar continuously - sent to DME supplier [employer] [phone] 04/29/22   Ivonne Andrew, NP   doxazosin (CARDURA) 4 MG tablet Take 4 mg by mouth at bedtime.  01/19/13   [provider]   fluconazole (DIFLUCAN) 100 MG tablet Take 200 mg by mouth today. Then 100 mg by mouth daily for 5 days.  Patient not taking: Reported on 06/08/2022 05/03/22   Ladene Artist, MD   furosemide (LASIX) 20 MG tablet Take 20 mg by mouth daily.  03/16/13   [provider]   glipiZIDE (GLUCOTROL XL) 5 MG  24 hr tablet Take 7.5 mg by mouth daily. 01/29/22   [provider]   insulin glargine (LANTUS) 100 UNIT/ML Solostar Pen Inject 28 Units into the skin daily. 06/02/22   Ivonne Andrew, NP   Insulin Pen Needle (PEN NEEDLES 3/16) 31G X 5 MM MISC 1 Application by Does not apply route daily. 03/24/22   Regalado, Belkys A, MD   lidocaine-prilocaine (EMLA) cream Apply 1 Application topically as needed. 04/19/22   Ladene Artist, MD   lipase/protease/amylase (CREON) 36000 UNITS CPEP capsule Take 1 capsule (36,000 Units total) by mouth 3 (three) times daily before meals. 06/07/22   Rana Snare, NP   lisinopril (ZESTRIL) 20 MG tablet Take 20 mg by mouth daily. 03/02/13   [provider]   Magnesium 500 MG TABS Take 500 mg by mouth daily.    [provider]   ondansetron (ZOFRAN) 4 MG tablet Take 1 tablet (4 mg total) by mouth every 6 (six) hours as needed for nausea. 03/24/22   Regalado, Belkys A, MD   potassium chloride SA (KLOR-CON M) 20 MEQ tablet Take 1 tablet (20 mEq total) by mouth daily. 05/25/22   Rana Snare, NP   prochlorperazine (COMPAZINE) 10 MG tablet Take 1 tablet (10 mg total) by mouth every 6 (six) hours as needed for nausea or vomiting. 04/19/22   Ladene Artist, MD       Allergies     Nicotine, Other, Shellfish allergy, Molds & smuts, and Statins      Review of Systems    Review of Systems   Constitutional:  Positive for fever.   Neurological:  Positive for weakness.   All other systems reviewed and are negative.    Physical Exam  Updated Vital Signs  BP (!) 140/72 (BP Location: Right Arm)   Pulse (!) 106   Temp (!) 100.8 F (37.7 C) (Oral)   Resp 18   SpO2 97%   Physical Exam  Vitals and nursing note reviewed.     81 year old female, resting comfortably and in no acute distress. Vital signs are significant for elevated temperature and heart rate. Oxygen saturation is 97%, which is normal.  Head is normocephalic and atraumatic. PERRLA, EOMI. Oropharynx is clear.  Neck is nontender and supple without adenopathy or JVD.  Back is nontender and there is no CVA tenderness.  Lungs are clear without rales, wheezes, or rhonchi.  Chest is nontender.  Mediport is present on the right.  Heart has regular rate and rhythm without murmur.  Abdomen is soft, flat, nontender.  Extremities have trace edema with moderate bilateral venous stasis changes, full range of motion is present.  Skin is warm and dry without rash.  Neurologic: Mental status is normal, cranial nerves are intact, moves all extremities equally.    ED Results / Procedures / Treatments    Labs  (all labs ordered are listed, but only abnormal results are displayed)  Labs Reviewed   COMPREHENSIVE METABOLIC PANEL - Abnormal; Notable for the following components:       Result Value     Glucose, Bld 141 (*)     Calcium 8.6 (*)     Albumin 2.8 (*)     AST 47 (*)     All other components within normal limits   CBC WITH DIFFERENTIAL/PLATELET - Abnormal; Notable for the following components:    RBC 3.50 (*)     Hemoglobin 9.8 (*)     HCT 31.1 (*)  All other components within normal limits   PROTIME-INR - Abnormal; Notable for the following components:    Prothrombin Time 15.8 (*)     INR 1.3 (*)     All other components within normal limits   CULTURE, BLOOD (ROUTINE X 2)   CULTURE, BLOOD (ROUTINE X 2)   URINE CULTURE   LACTIC ACID, PLASMA   APTT   LACTIC ACID, PLASMA   URINALYSIS, ROUTINE W REFLEX MICROSCOPIC     EKG  EKG Interpretation    Date/Time:  Tuesday June 08 2022 23:57:40 EDT  Ventricular Rate:  98  PR Interval:  220  QRS Duration: 87  QT Interval:  353  QTC Calculation: 451  R Axis:   29  Text Interpretation: Sinus rhythm Prolonged PR interval Otherwise within normal limits When compared with ECG of 04/14/2005, PR interval has increased Confirmed by Dione Booze ([id number]) on 06/09/2022 12:13:04 AM    Radiology  DG Chest Port 1 View    Result Date: 06/08/2022  CLINICAL DATA:  Fever EXAM: PORTABLE CHEST 1 VIEW COMPARISON:  Chest x-ray 04/14/2005. FINDINGS: Right chest port catheter tip projects over the SVC. The heart size and mediastinal contours are within normal limits. Both lungs are clear. The visualized skeletal structures are unremarkable. IMPRESSION: No active disease. Electronically Signed   By: Darliss Cheney M.D.   On: 06/08/2022 23:49      Procedures  Procedures   Cardiac monitor shows sinus tachycardia, per my interpretation.    Medications Ordered in ED  Medications - No data to display    ED Course/ Medical Decision Making/ A&P      Medical Decision Making  Amount and/or Complexity of Data Reviewed  Labs: ordered.  Radiology: ordered.  ECG/medicine tests: ordered.    Fever and patient receiving chemotherapy concerning for possible sepsis.  However, patient is nontoxic in  appearance.  Old records are reviewed, and she had oncology office visit earlier today followed by infusion of gemcitabine and paclitaxel.  CBC this morning showed WBC of 4.3 with absolute neutrophil count of 2.3.  It is very unlikely that she is neutropenic this soon following infusion, but I have started her on the evolving sepsis pathway.    Chest x-ray shows no evidence of pneumonia.  I have independently viewed the image, and agree with radiologist interpretation.  I have reviewed and interpreted her ECG and my interpretation is first-degree AV block and otherwise normal ECG.  I have reviewed and interpreted her laboratory tests and my interpretation is stable anemia, adequate WBC and adequate total neutrophil count, borderline elevated AST of uncertain clinical significance.  Lactic acid level is normal.  No evidence of sepsis.    I have reviewed and interpreted her urinalysis, and my interpretation is no evidence of urinary tract infection.  With adequate absolute neutrophil count and no focus infection, patient is felt to be safe for discharge.  No indication for hospital admission, no indication for antibiotics.  Patient is advised to use over-the-counter antipyretics as needed for fever, return if she has any new or worsening symptoms.  Advise she would be contacted if cultures are positive.    Final Clinical Impression(s) / ED Diagnoses  Final diagnoses:   Fever in adult   Normochromic normocytic anemia   Elevated AST (SGOT)     Rx / DC Orders  ED Discharge Orders       None           Dione Booze, MD  06/09/22  [place]    Electronically signed by Dione Booze, MD at 06/09/2022  4:02 AM EDT

## 2022-06-08 NOTE — Progress Notes (Signed)
Patient seen by Dr. Benay Spice today  Vitals are within treatment parameters. MD aware of BP 146/80--no intervention needed  Labs reviewed by Dr. Benay Spice and are within treatment parameters.  Per physician team, patient is ready for treatment and there are NO modifications to the treatment plan.

## 2022-06-08 NOTE — ED Provider Notes (Signed)
[facility] DEPT Provider Note   CSN: [id number] Arrival date & time: 06/08/22  2254     History {Add pertinent medical, surgical, social history, OB history to HPI:1} Chief Complaint  Patient presents with   Fever   Weakness    Sarah Davies is a 81 y.o. female.  The history is provided by the patient and a relative.  Fever Weakness Associated symptoms: fever   She has history hypertension, diabetes, hyperlipidemia, pancreatic cancer and had chemotherapy infusion earlier today of paclitaxel and gemcitabine and comes in because of fever and difficulty arousing.  Temperature at home was reported to be 102.  She has had some chills and sweats.  She has had cough for several days which is unchanged.  Cough is nonproductive.  She denies rhinorrhea, sore throat.  She denies nausea or vomiting.  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  She has had urinary urgency but no dysuria.  She denies arthralgias or myalgias.  She feels that she is back to her baseline.  Of note, she has not taken any antipyretics.   Home Medications Prior to Admission medications   Medication Sig Start Date End Date Taking? Authorizing Provider  ACCU-CHEK GUIDE test strip PLEASE SEE ATTACHED FOR DETAILED DIRECTIONS 03/24/22   [provider]  Accu-Chek Softclix Lancets lancets 4 (four) times daily. 03/24/22   [provider]  acetaminophen (TYLENOL) 500 MG tablet Take 1,000 mg by mouth every 6 (six) hours as needed for moderate pain.    [provider]  amLODipine (NORVASC) 10 MG tablet Take 1 tablet (10 mg total) by mouth daily. 04/19/22 07/18/22  Fenton Foy, NP  b complex vitamins tablet Take 1 tablet by mouth daily.    [provider]  blood glucose meter kit and supplies Dispense based on patient and insurance preference. Use up to four times daily as directed. (FOR ICD-10 E10.9, E11.9). 03/24/22   Regalado, Belkys A, MD  calcium  carbonate (OS-CAL) 600 MG TABS Take 600 mg by mouth 2 (two) times daily with a meal.    [provider]  carvedilol (COREG) 25 MG tablet Take 50 mg by mouth 2 (two) times daily with a meal. 03/21/13   [provider]  cholecalciferol (VITAMIN D) 1000 UNITS tablet Take 1,000 Units by mouth daily.    [provider]  Continuous Blood Gluc Receiver (FREESTYLE LIBRE 2 READER) DEVI Use to check blood sugar continuously - sent to DME supplier [employer] [phone]) 04/29/22   Fenton Foy, NP  Continuous Blood Gluc Sensor (FREESTYLE LIBRE 2 SENSOR) MISC Use to check blood sugar continuously - sent to DME supplier [employer] [phone]) 04/29/22   Fenton Foy, NP  doxazosin (CARDURA) 4 MG tablet Take 4 mg by mouth at bedtime.  01/19/13   [provider]  fluconazole (DIFLUCAN) 100 MG tablet Take 200 mg by mouth today. Then 100 mg by mouth daily for 5 days. Patient not taking: Reported on 06/08/2022 05/03/22   Ladell Pier, MD  furosemide (LASIX) 20 MG tablet Take 20 mg by mouth daily.  03/16/13   [provider]  glipiZIDE (GLUCOTROL XL) 5 MG 24 hr tablet Take 7.5 mg by mouth daily. 01/29/22   [provider]  insulin glargine (LANTUS) 100 UNIT/ML Solostar Pen Inject 28 Units into the skin daily. 06/02/22   Fenton Foy, NP  Insulin Pen Needle (PEN NEEDLES 3/16") 31G X 5 MM MISC  1 Application by Does not apply route daily. 03/24/22   Regalado, Belkys A, MD  lidocaine-prilocaine (EMLA) cream Apply 1 Application topically as needed. 04/19/22   Ladell Pier, MD  lipase/protease/amylase (CREON) 36000 UNITS CPEP capsule Take 1 capsule (36,000 Units total) by mouth 3 (three) times daily before meals. 06/07/22   Owens Shark, NP  lisinopril (ZESTRIL) 20 MG tablet Take 20 mg by mouth daily. 03/02/13   [provider]  Magnesium 500 MG TABS Take 500 mg by mouth daily.    [provider]  ondansetron  (ZOFRAN) 4 MG tablet Take 1 tablet (4 mg total) by mouth every 6 (six) hours as needed for nausea. 03/24/22   Regalado, Belkys A, MD  potassium chloride SA (KLOR-CON M) 20 MEQ tablet Take 1 tablet (20 mEq total) by mouth daily. 05/25/22   Owens Shark, NP  prochlorperazine (COMPAZINE) 10 MG tablet Take 1 tablet (10 mg total) by mouth every 6 (six) hours as needed for nausea or vomiting. 04/19/22   Ladell Pier, MD      Allergies    Nicotine, Other, Shellfish allergy, Molds & smuts, and Statins    Review of Systems   Review of Systems  Constitutional:  Positive for fever.  Neurological:  Positive for weakness.  All other systems reviewed and are negative.   Physical Exam Updated Vital Signs BP (!) 140/72 (BP Location: Right Arm)   Pulse (!) 106   Temp (!) 100.8 F (37.7 C) (Oral)   Resp 18   SpO2 97%  Physical Exam Vitals and nursing note reviewed.   81 year old female, resting comfortably and in no acute distress. Vital signs are significant for elevated temperature and heart rate. Oxygen saturation is 97%, which is normal. Head is normocephalic and atraumatic. PERRLA, EOMI. Oropharynx is clear. Neck is nontender and supple without adenopathy or JVD. Back is nontender and there is no CVA tenderness. Lungs are clear without rales, wheezes, or rhonchi. Chest is nontender.  Mediport is present on the right. Heart has regular rate and rhythm without murmur. Abdomen is soft, flat, nontender. Extremities have trace edema with moderate bilateral venous stasis changes, full range of motion is present. Skin is warm and dry without rash. Neurologic: Mental status is normal, cranial nerves are intact, moves all extremities equally.  ED Results / Procedures / Treatments   Labs (all labs ordered are listed, but only abnormal results are displayed) Labs Reviewed - No data to display  EKG None  Radiology No results found.  Procedures Procedures  Cardiac monitor shows sinus  tachycardia, per my interpretation.  Medications Ordered in ED Medications - No data to display  ED Course/ Medical Decision Making/ A&P                           Medical Decision Making  Fever and patient receiving chemotherapy concerning for possible sepsis.  However, patient is nontoxic in appearance.  Old records are reviewed, and she had oncology office visit earlier today followed by infusion of gemcitabine and paclitaxel.  CBC this morning showed WBC of 4.3 with absolute neutrophil count of 2.3.  It is very unlikely that she is neutropenic this soon following infusion, but I have started her on the evolving sepsis pathway.  {Document critical care time when appropriate:1} {Document review of labs and clinical decision tools ie heart score, Chads2Vasc2 etc:1}  {Document your independent review of radiology images, and any outside  records:1} {Document your discussion with family members, caretakers, and with consultants:1} {Document social determinants of health affecting pt's care:1} {Document your decision making why or why not admission, treatments were needed:1} Final Clinical Impression(s) / ED Diagnoses Final diagnoses:  None    Rx / DC Orders ED Discharge Orders     None

## 2022-06-08 NOTE — ED Triage Notes (Signed)
Pt reports with a fever and weakness after having chemo today. Pt states that she was hard to awaken after taking her nap.

## 2022-06-08 NOTE — Patient Instructions (Signed)
[facility]   Discharge Instructions: Thank you for choosing [facility] to provide your oncology and hematology care.   If you have a lab appointment with the Cancer Center, please go directly to the Cancer Center and check in at the registration area.   Wear comfortable clothing and clothing appropriate for easy access to any Portacath or PICC line.   We strive to give you quality time with your provider. You may need to reschedule your appointment if you arrive late (15 or more minutes).  Arriving late affects you and other patients whose appointments are after yours.  Also, if you miss three or more appointments without notifying the office, you may be dismissed from the clinic at the provider's discretion.      For prescription refill requests, have your pharmacy contact our office and allow 72 hours for refills to be completed.    Today you received the following chemotherapy and/or immunotherapy agents Abraxane, Gemzar      To help prevent nausea and vomiting after your treatment, we encourage you to take your nausea medication as directed.  BELOW ARE SYMPTOMS THAT SHOULD BE REPORTED IMMEDIATELY: *FEVER GREATER THAN 100.4 F (38 C) OR HIGHER *CHILLS OR SWEATING *NAUSEA AND VOMITING THAT IS NOT CONTROLLED WITH YOUR NAUSEA MEDICATION *UNUSUAL SHORTNESS OF BREATH *UNUSUAL BRUISING OR BLEEDING *URINARY PROBLEMS (pain or burning when urinating, or frequent urination) *BOWEL PROBLEMS (unusual diarrhea, constipation, pain near the anus) TENDERNESS IN MOUTH AND THROAT WITH OR WITHOUT PRESENCE OF ULCERS (sore throat, sores in mouth, or a toothache) UNUSUAL RASH, SWELLING OR PAIN  UNUSUAL VAGINAL DISCHARGE OR ITCHING   Items with * indicate a potential emergency and should be followed up as soon as possible or go to the Emergency Department if any problems should occur.  Please show the CHEMOTHERAPY ALERT CARD or IMMUNOTHERAPY ALERT CARD at  check-in to the Emergency Department and triage nurse.  Should you have questions after your visit or need to cancel or reschedule your appointment, please contact [facility]  Dept: [phone]  and follow the prompts.  Office hours are 8:00 a.m. to 4:30 p.m. Monday - Friday. Please note that voicemails left after 4:00 p.m. may not be returned until the following business day.  We are closed weekends and major holidays. You have access to a nurse at all times for urgent questions. Please call the main number to the clinic Dept: [phone] and follow the prompts.   For any non-urgent questions, you may also contact your provider using MyChart. We now offer e-Visits for anyone 18 and older to request care online for non-urgent symptoms. For details visit [email or web address].   Also download the MyChart app! Go to the app store, search "MyChart", open the app, select [facility], and log in with your MyChart username and password.  Masks are optional in the cancer centers. If you would like for your care team to wear a mask while they are taking care of you, please let them know. You may have one support person who is at least 80 years old accompany you for your appointments.  Paclitaxel Nanoparticle Albumin-Bound Injection What is this medication? NANOPARTICLE ALBUMIN-BOUND PACLITAXEL (Na no PAHR ti kuhl al BYOO muhn-bound PAK li TAX el) treats some types of cancer. It works by slowing down the growth of cancer cells. This medicine may be used for other purposes; ask your health care provider or pharmacist if you have questions. COMMON BRAND   NAME(S): Abraxane What should I tell my care team before I take this medication? They need to know if you have any of these conditions: Liver disease Low white blood cell levels An unusual or allergic reaction to paclitaxel, albumin, other medications, foods, dyes, or preservatives If you or your partner are pregnant or  trying to get pregnant Breast-feeding How should I use this medication? This medication is injected into a vein. It is given by your care team in a hospital or clinic setting. Talk to your care team about the use of this medication in children. Special care may be needed. Overdosage: If you think you have taken too much of this medicine contact a poison control center or emergency room at once. NOTE: This medicine is only for you. Do not share this medicine with others. What if I miss a dose? Keep appointments for follow-up doses. It is important not to miss your dose. Call your care team if you are unable to keep an appointment. What may interact with this medication? Other medications may affect the way this medication works. Talk with your care team about all of the medications you take. They may suggest changes to your treatment plan to lower the risk of side effects and to make sure your medications work as intended. This list may not describe all possible interactions. Give your health care provider a list of all the medicines, herbs, non-prescription drugs, or dietary supplements you use. Also tell them if you smoke, drink alcohol, or use illegal drugs. Some items may interact with your medicine. What should I watch for while using this medication? Your condition will be monitored carefully while you are receiving this medication. You may need blood work while taking this medication. This medication may make you feel generally unwell. This is not uncommon as chemotherapy can affect healthy cells as well as cancer cells. Report any side effects. Continue your course of treatment even though you feel ill unless your care team tells you to stop. This medication can cause serious allergic reactions. To reduce the risk, your care team may give you other medications to take before receiving this one. Be sure to follow the directions from your care team. This medication may increase your risk of  getting an infection. Call your care team for advice if you get a fever, chills, sore throat, or other symptoms of a cold or flu. Do not treat yourself. Try to avoid being around people who are sick. This medication may increase your risk to bruise or bleed. Call your care team if you notice any unusual bleeding. Be careful brushing or flossing your teeth or using a toothpick because you may get an infection or bleed more easily. If you have any dental work done, tell your dentist you are receiving this medication. Talk to your care team if you or your partner may be pregnant. Serious birth defects can occur if you take this medication during pregnancy and for 6 months after the last dose. You will need a negative pregnancy test before starting this medication. Contraception is recommended while taking this medication and for 6 months after the last dose. Your care team can help you find the option that works for you. If your partner can get pregnant, use a condom during sex while taking this medication and for 3 months after the last dose. Do not breastfeed while taking this medication and for 2 weeks after the last dose. This medication may cause infertility. Talk to your care team   if you are concerned about your fertility. What side effects may I notice from receiving this medication? Side effects that you should report to your care team as soon as possible: Allergic reactions--skin rash, itching, hives, swelling of the face, lips, tongue, or throat Dry cough, shortness of breath or trouble breathing Infection--fever, chills, cough, sore throat, wounds that don't heal, pain or trouble when passing urine, general feeling of discomfort or being unwell Low red blood cell level--unusual weakness or fatigue, dizziness, headache, trouble breathing Pain, tingling, or numbness in the hands or feet Stomach pain, unusual weakness or fatigue, nausea, vomiting, diarrhea, or fever that lasts longer than  expected Unusual bruising or bleeding Side effects that usually do not require medical attention (report to your care team if they continue or are bothersome): Diarrhea Fatigue Hair loss Loss of appetite Nausea Vomiting This list may not describe all possible side effects. Call your doctor for medical advice about side effects. You may report side effects to FDA at 1-800-FDA-1088. Where should I keep my medication? This medication is given in a hospital or clinic. It will not be stored at home. NOTE: This sheet is a summary. It may not cover all possible information. If you have questions about this medicine, talk to your doctor, pharmacist, or health care provider.  2023 Elsevier/Gold Standard (2022-01-27 00:00:00)  Gemcitabine Injection What is this medication? GEMCITABINE (jem SYE ta been) treats some types of cancer. It works by slowing down the growth of cancer cells. This medicine may be used for other purposes; ask your health care provider or pharmacist if you have questions. COMMON BRAND NAME(S): Gemzar, Infugem What should I tell my care team before I take this medication? They need to know if you have any of these conditions: Blood disorders Infection Kidney disease Liver disease Lung or breathing disease, such as asthma or COPD Recent or ongoing radiation therapy An unusual or allergic reaction to gemcitabine, other medications, foods, dyes, or preservatives If you or your partner are pregnant or trying to get pregnant Breast-feeding How should I use this medication? This medication is injected into a vein. It is given by your care team in a hospital or clinic setting. Talk to your care team about the use of this medication in children. Special care may be needed. Overdosage: If you think you have taken too much of this medicine contact a poison control center or emergency room at once. NOTE: This medicine is only for you. Do not share this medicine with others. What  if I miss a dose? Keep appointments for follow-up doses. It is important not to miss your dose. Call your care team if you are unable to keep an appointment. What may interact with this medication? Interactions have not been studied. This list may not describe all possible interactions. Give your health care provider a list of all the medicines, herbs, non-prescription drugs, or dietary supplements you use. Also tell them if you smoke, drink alcohol, or use illegal drugs. Some items may interact with your medicine. What should I watch for while using this medication? Your condition will be monitored carefully while you are receiving this medication. This medication may make you feel generally unwell. This is not uncommon, as chemotherapy can affect healthy cells as well as cancer cells. Report any side effects. Continue your course of treatment even though you feel ill unless your care team tells you to stop. In some cases, you may be given additional medications to help with side effects. Follow   all directions for their use. This medication may increase your risk of getting an infection. Call your care team for advice if you get a fever, chills, sore throat, or other symptoms of a cold or flu. Do not treat yourself. Try to avoid being around people who are sick. This medication may increase your risk to bruise or bleed. Call your care team if you notice any unusual bleeding. Be careful brushing or flossing your teeth or using a toothpick because you may get an infection or bleed more easily. If you have any dental work done, tell your dentist you are receiving this medication. Avoid taking medications that contain aspirin, acetaminophen, ibuprofen, naproxen, or ketoprofen unless instructed by your care team. These medications may hide a fever. Talk to your care team if you or your partner wish to become pregnant or think you might be pregnant. This medication can cause serious birth defects if taken  during pregnancy and for 6 months after the last dose. A negative pregnancy test is required before starting this medication. A reliable form of contraception is recommended while taking this medication and for 6 months after the last dose. Talk to your care team about effective forms of contraception. Do not father a child while taking this medication and for 3 months after the last dose. Use a condom while having sex during this time period. Do not breastfeed while taking this medication and for at least 1 week after the last dose. This medication may cause infertility. Talk to your care team if you are concerned about your fertility. What side effects may I notice from receiving this medication? Side effects that you should report to your care team as soon as possible: Allergic reactions--skin rash, itching, hives, swelling of the face, lips, tongue, or throat Capillary leak syndrome--stomach or muscle pain, unusual weakness or fatigue, feeling faint or lightheaded, decrease in the amount of urine, swelling of the ankles, hands, or feet, trouble breathing Infection--fever, chills, cough, sore throat, wounds that don't heal, pain or trouble when passing urine, general feeling of discomfort or being unwell Liver injury--right upper belly pain, loss of appetite, nausea, light-colored stool, dark yellow or brown urine, yellowing skin or eyes, unusual weakness or fatigue Low red blood cell level--unusual weakness or fatigue, dizziness, headache, trouble breathing Lung injury--shortness of breath or trouble breathing, cough, spitting up blood, chest pain, fever Stomach pain, bloody diarrhea, pale skin, unusual weakness or fatigue, decrease in the amount of urine, which may be signs of hemolytic uremic syndrome Sudden and severe headache, confusion, change in vision, seizures, which may be signs of posterior reversible encephalopathy syndrome (PRES) Unusual bruising or bleeding Side effects that usually do  not require medical attention (report to your care team if they continue or are bothersome): Diarrhea Drowsiness Hair loss Nausea Pain, redness, or swelling with sores inside the mouth or throat Vomiting This list may not describe all possible side effects. Call your doctor for medical advice about side effects. You may report side effects to FDA at 1-800-FDA-1088. Where should I keep my medication? This medication is given in a hospital or clinic. It will not be stored at home. NOTE: This sheet is a summary. It may not cover all possible information. If you have questions about this medicine, talk to your doctor, pharmacist, or health care provider.  2023 Elsevier/Gold Standard (2022-01-27 00:00:00)  

## 2022-06-08 NOTE — Progress Notes (Signed)
Sarah Davies returns as scheduled.  He completed another cycle of gemcitabine/Abraxane 05/25/2022.  No nausea, fever, or rash.  She feels the thrush may be returning.  Her blood sugar has been running in the 300 range.  Good appetite.  Objective:  Vital signs in last 24 hours:  Blood pressure (!) 146/81, pulse 69, temperature 98.2 F (36.8 C), temperature source Skin, resp. rate 18, weight 225 lb 9.6 oz (102.3 kg), SpO2 99 %.    HEENT: No thrush or ulcers Resp: Lungs clear bilaterally Cardio: Regular rate and rhythm GI: No hepatosplenomegaly, no mass Vascular: Trace edema at the right greater than left lower leg with chronic stasis change, no erythema or tenderness  Portacath/PICC-without erythema  Lab Results:  Lab Results  Component Value Date   WBC 4.3 06/08/2022   HGB 10.2 (L) 06/08/2022   HCT 31.6 (L) 06/08/2022   MCV 85.6 06/08/2022   PLT 173 06/08/2022   NEUTROABS 2.3 06/08/2022    CMP  Lab Results  Component Value Date   NA 137 06/08/2022   K 4.0 06/08/2022   CL 103 06/08/2022   CO2 27 06/08/2022   GLUCOSE 230 (H) 06/08/2022   BUN 10 06/08/2022   CREATININE 0.65 06/08/2022   CALCIUM 9.0 06/08/2022   PROT 7.1 06/08/2022   ALBUMIN 3.5 06/08/2022   AST 25 06/08/2022   ALT 22 06/08/2022   ALKPHOS 78 06/08/2022   BILITOT 0.8 06/08/2022   GFRNONAA >60 06/08/2022    Lab Results  Component Value Date   VVO160 737 (H) 05/25/2022     Medications: I have reviewed the patient's current medications.   Assessment/Plan: Pancreas cancer 03/22/2022 CT abdomen/pelvis-ill-defined 3.6 x 2.5 cm hypodense mass in the uncinate process of the pancreas; possible associated acute pancreatitis; fatty liver; faint hypodense focus in the inferior right lobe of the liver too small to characterize; no intrahepatic biliary dilatation.   03/23/2022 MRI-hypoenhancing mass in the  uncinate process/inferior head of the pancreas abutting the SMV with distortion of the SMV, less than 180 degree involvement by MR but with limited assessment; pancreatic duct narrowing/obstruction and early involvement of the distal common bile duct; subtle hepatic lesion in the right hepatic lobe; hazy mesentery may reflect concomitant mesenteritis or lymphatic congestion; small lymph nodes adjacent to the masslike area in the head of the pancreas. 03/26/2022 upper EUS-mass identified in the pancreatic head and uncinate process, appearance consistent with adenocarcinoma.  FNA pancreas head showed malignant cells consistent with adenocarcinoma. 04/07/2022 CT abdomen/pelvis pancreatic protocol-adenocarcinoma within the pancreatic uncinate process, isolated right hepatic lobe lesion suspicious for metastatic disease, contact of the SMV by tumor over an approximately 180 degree span with no evidence of occlusion and no arterial involvement, nonspecific borderline enlarged portacaval lymph node, small bowel mesenteric nodes most likely related to mesenteric adenitis/panniculitis Cycle 1 gemcitabine/Abraxane 04/27/2022 Cycle 2 gemcitabine/Abraxane 05/11/2022 Cycle 3 gemcitabine/Abraxane 05/25/2022 Cycle 4 gemcitabine/Abraxane 06/08/2022 Diabetes with recent poor control Diarrhea, typically after eating Diabetic neuropathy Hypertension Hyperlipidemia Leg edema-04/06/2022 bilateral lower extremities negative for DVT Severe oropharyngeal candidiasis 05/03/2022-Diflucan 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  She is tolerating the treatment well.  Her performance status appears significantly improved over the past several weeks.  She will complete another cycle  of gemcitabine/Abraxane today.  The CA 19-9 was lower on  05/25/2022.  We will check the CA 19-9 when she returns in 2 weeks.  The plan is to complete 6 cycles of gemcitabine/Abraxane prior to a restaging CT.  She will contact us for recurrent thrush.  Sarah Coder, MD  06/08/2022  9:40 AM

## 2022-06-09 ENCOUNTER — Telehealth: Payer: Self-pay | Admitting: *Deleted

## 2022-06-09 ENCOUNTER — Other Ambulatory Visit (HOSPITAL_BASED_OUTPATIENT_CLINIC_OR_DEPARTMENT_OTHER): Payer: Self-pay

## 2022-06-09 LAB — COMPREHENSIVE METABOLIC PANEL
ALT: 27 U/L (ref 0–44)
AST: 47 U/L — ABNORMAL HIGH (ref 15–41)
Albumin: 2.8 g/dL — ABNORMAL LOW (ref 3.5–5.0)
Alkaline Phosphatase: 69 U/L (ref 38–126)
Anion gap: 5 (ref 5–15)
BUN: 13 mg/dL (ref 8–23)
CO2: 23 mmol/L (ref 22–32)
Calcium: 8.6 mg/dL — ABNORMAL LOW (ref 8.9–10.3)
Chloride: 108 mmol/L (ref 98–111)
Creatinine, Ser: 0.73 mg/dL (ref 0.44–1.00)
GFR, Estimated: >60 mL/min (ref >60)
Glucose, Bld: 141 mg/dL — ABNORMAL HIGH (ref 70–99)
Potassium: 3.5 mmol/L (ref 3.5–5.1)
Sodium: 136 mmol/L (ref 135–145)
Total Bilirubin: 0.9 mg/dL (ref 0.3–1.2)
Total Protein: 6.5 g/dL (ref 6.5–8.1)

## 2022-06-09 LAB — CBC WITH DIFFERENTIAL/PLATELET
Abs Immature Granulocytes: 0.01 10*3/uL (ref 0.00–0.07)
Basophils Absolute: 0 10*3/uL (ref 0.0–0.1)
Basophils Relative: 0 %
Eosinophils Absolute: 0 10*3/uL (ref 0.0–0.5)
Eosinophils Relative: 1 %
HCT: 31.1 % — ABNORMAL LOW (ref 36.0–46.0)
Hemoglobin: 9.8 g/dL — ABNORMAL LOW (ref 12.0–15.0)
Immature Granulocytes: 0 %
Lymphocytes Relative: 17 %
Lymphs Abs: 0.8 10*3/uL (ref 0.7–4.0)
MCH: 28 pg (ref 26.0–34.0)
MCHC: 31.5 g/dL (ref 30.0–36.0)
MCV: 88.9 fL (ref 80.0–100.0)
Monocytes Absolute: 0.7 10*3/uL (ref 0.1–1.0)
Monocytes Relative: 16 %
Neutro Abs: 3 10*3/uL (ref 1.7–7.7)
Neutrophils Relative %: 66 %
Platelets: 183 10*3/uL (ref 150–400)
RBC: 3.5 MIL/uL — ABNORMAL LOW (ref 3.87–5.11)
RDW: 15.1 % (ref 11.5–15.5)
WBC: 4.5 10*3/uL (ref 4.0–10.5)
nRBC: 0 % (ref 0.0–0.2)

## 2022-06-09 LAB — URINALYSIS, ROUTINE W REFLEX MICROSCOPIC
Bilirubin Urine: NEGATIVE
Glucose, UA: NEGATIVE mg/dL
Hgb urine dipstick: NEGATIVE
Ketones, ur: NEGATIVE mg/dL
Leukocytes,Ua: NEGATIVE
Nitrite: NEGATIVE
Protein, ur: NEGATIVE mg/dL
Specific Gravity, Urine: 1.015 (ref 1.005–1.030)
pH: 5 (ref 5.0–8.0)

## 2022-06-09 LAB — PROTIME-INR
INR: 1.3 — ABNORMAL HIGH (ref 0.8–1.2)
Prothrombin Time: 15.8 seconds — ABNORMAL HIGH (ref 11.4–15.2)

## 2022-06-09 LAB — LACTIC ACID, PLASMA
Lactic Acid, Venous: 0.7 mmol/L (ref 0.5–1.9)
Lactic Acid, Venous: 0.7 mmol/L (ref 0.5–1.9)

## 2022-06-09 LAB — APTT: aPTT: 28 seconds (ref 24–36)

## 2022-06-09 NOTE — Telephone Encounter (Signed)
Called to f/u on status since ER visit last night. Niece reports she is more alert today and ate breakfast. No fever. They will call if any change. Thus far blood culture is negative. Urine and CXR OK.

## 2022-06-09 NOTE — Discharge Instructions (Signed)
Your evaluation today did not show any sign of serious infection, and your white blood cell count is adequate to fight off infection.  You may take acetaminophen or ibuprofen as needed for your fever.  Return if you are having any new or concerning symptoms.  You did have cultures drawn today.  If any of them did show presence of infection, we will contact you in the next 2-3 days.

## 2022-06-10 ENCOUNTER — Ambulatory Visit (INDEPENDENT_AMBULATORY_CARE_PROVIDER_SITE_OTHER): Payer: Medicare PPO | Admitting: Nurse Practitioner

## 2022-06-10 ENCOUNTER — Other Ambulatory Visit (HOSPITAL_BASED_OUTPATIENT_CLINIC_OR_DEPARTMENT_OTHER): Payer: Self-pay

## 2022-06-10 ENCOUNTER — Other Ambulatory Visit: Payer: Medicare PPO | Admitting: Pharmacist

## 2022-06-10 ENCOUNTER — Encounter: Payer: Self-pay | Admitting: Nurse Practitioner

## 2022-06-10 DIAGNOSIS — Z794 Long term (current) use of insulin: Secondary | ICD-10-CM

## 2022-06-10 DIAGNOSIS — E119 Type 2 diabetes mellitus without complications: Secondary | ICD-10-CM

## 2022-06-10 LAB — URINE CULTURE: Culture: NO GROWTH

## 2022-06-10 MED ORDER — INSULIN GLARGINE 100 UNIT/ML SOLOSTAR PEN
32.0000 [IU] | PEN_INJECTOR | Freq: Every day | SUBCUTANEOUS | 11 refills | Status: DC
  Filled 2022-06-10: qty 15, 46d supply, fill #0

## 2022-06-10 MED ORDER — GLIPIZIDE 5 MG PO TABS
7.5000 mg | ORAL_TABLET | Freq: Two times a day (BID) | ORAL | 2 refills | Status: DC
  Filled 2022-06-10: qty 90, 30d supply, fill #0

## 2022-06-10 MED ORDER — CARVEDILOL 25 MG PO TABS
50.0000 mg | ORAL_TABLET | Freq: Two times a day (BID) | ORAL | 2 refills | Status: DC
Start: 2022-06-10 — End: 2022-09-28
  Filled 2022-06-10: qty 120, 30d supply, fill #0
  Filled 2022-06-11: qty 80, 20d supply, fill #0
  Filled 2022-06-11: qty 40, 10d supply, fill #0
  Filled 2022-08-26: qty 120, 30d supply, fill #1
  Filled 2022-09-07: qty 120, 30d supply, fill #2

## 2022-06-10 NOTE — Progress Notes (Signed)
$'@Patient'X$  ID: Sarah Davies, female    DOB: [birth year], 81 y.o.   MRN: [id number]  Chief Complaint  Patient presents with   Follow-up    Pt states she has been feeling fatigue  for the past 2 days also has tightness on her sides pt states she has been feeling like this since she started chemo    Referring provider: Fenton Foy, NP   HPI  81 year old female with history of hypertension, pancreatic cancer, diabetes, hyperlipidemia.  Patient has followed with oncology. Is currently undergoing chemo treatments. She has followed with Catie Jodi Mourning (pharmacist) for diabetic medication management. She is currently on glipizide and Lantus. Patient blood sugars are still in the 300's. Will increase lantus to 32 units. Denies f/c/s, n/v/d, hemoptysis, PND, leg swelling Denies chest pain or edema     Allergies  Allergen Reactions   Nicotine Other (See Comments)    Throat closes    Other Other (See Comments)    Joint pain Animal Dander From when she was tested, it was stimulated.      Shellfish Allergy Other (See Comments)    Has never had a reaction, was just told through allergy testing that she is allergic to shellfish "Throat closes"   Molds & Smuts Other (See Comments)    From when she was tested, it was stimulated.      Statins Other (See Comments)    Muscle aches    Immunization History  Administered Date(s) Administered   Fluad Quad(high Dose 65+) 07/27/2016, 10/31/2017   Influenza, High Dose Seasonal PF 10/17/2014   Influenza-Unspecified 07/23/2021   PFIZER(Purple Top)SARS-COV-2 Vaccination 12/10/2019, 01/02/2020   Pneumococcal Conjugate-13 07/27/2016   Pneumococcal-Unspecified 05/11/2007    Past Medical History:  Diagnosis Date   Allergy    Cataract    Diabetes mellitus without complication ([place])    Hypertension    Pancreatic cancer ([place])     Tobacco History: Social History   Tobacco Use  Smoking Status Never   Passive exposure: Never  Smokeless  Tobacco Never   Counseling given: Not Answered   Outpatient Encounter Medications as of 06/10/2022  Medication Sig   Accu-Chek Softclix Lancets lancets 4 (four) times daily.   acetaminophen (TYLENOL) 500 MG tablet Take 1,000 mg by mouth every 6 (six) hours as needed for moderate pain.   b complex vitamins tablet Take 1 tablet by mouth daily.   blood glucose meter kit and supplies Dispense based on patient and insurance preference. Use up to four times daily as directed. (FOR ICD-10 E10.9, E11.9).   calcium carbonate (OS-CAL) 600 MG TABS Take 600 mg by mouth 2 (two) times daily with a meal.   cholecalciferol (VITAMIN D) 1000 UNITS tablet Take 1,000 Units by mouth daily.   Continuous Blood Gluc Receiver (FREESTYLE LIBRE 2 READER) DEVI Use to check blood sugar continuously - sent to DME supplier [facility] [phone])   Continuous Blood Gluc Sensor (FREESTYLE LIBRE 2 SENSOR) MISC Use to check blood sugar continuously - sent to DME supplier [facility] [phone])   doxazosin (CARDURA) 4 MG tablet Take 4 mg by mouth at bedtime.    furosemide (LASIX) 20 MG tablet Take 20 mg by mouth daily.    glipiZIDE (GLUCOTROL XL) 5 MG 24 hr tablet Take 7.5 mg by mouth daily.   glipiZIDE (GLUCOTROL) 5 MG tablet Take 1.5 tablets (7.5 mg total) by mouth 2 (two) times daily before a meal.   Insulin Pen Needle (PEN  NEEDLES 3/16") 31G X 5 MM MISC 1 Application by Does not apply route daily.   lidocaine-prilocaine (EMLA) cream Apply 1 Application topically as needed.   lipase/protease/amylase (CREON) 36000 UNITS CPEP capsule Take 1 capsule (36,000 Units total) by mouth 3 (three) times daily before meals.   lisinopril (ZESTRIL) 20 MG tablet Take 20 mg by mouth daily.   Magnesium 500 MG TABS Take 500 mg by mouth daily.   potassium chloride SA (KLOR-CON M) 20 MEQ tablet Take 1 tablet (20 mEq total) by mouth daily.   [DISCONTINUED] carvedilol (COREG) 25 MG tablet Take 50 mg by mouth 2  (two) times daily with a meal.   [DISCONTINUED] insulin glargine (LANTUS) 100 UNIT/ML Solostar Pen Inject 28 Units into the skin daily.   ACCU-CHEK GUIDE test strip PLEASE SEE ATTACHED FOR DETAILED DIRECTIONS   amLODipine (NORVASC) 10 MG tablet Take 1 tablet (10 mg total) by mouth daily. (Patient not taking: Reported on 06/09/2022)   carvedilol (COREG) 25 MG tablet Take 2 tablets (50 mg total) by mouth 2 (two) times daily with a meal.   fluconazole (DIFLUCAN) 100 MG tablet Take 200 mg by mouth today. Then 100 mg by mouth daily for 5 days. (Patient not taking: Reported on 06/08/2022)   insulin glargine (LANTUS) 100 UNIT/ML Solostar Pen Inject 32 Units into the skin daily.   ondansetron (ZOFRAN) 4 MG tablet Take 1 tablet (4 mg total) by mouth every 6 (six) hours as needed for nausea. (Patient not taking: Reported on 06/09/2022)   prochlorperazine (COMPAZINE) 10 MG tablet Take 1 tablet (10 mg total) by mouth every 6 (six) hours as needed for nausea or vomiting. (Patient not taking: Reported on 06/09/2022)   Facility-Administered Encounter Medications as of 06/10/2022  Medication   prochlorperazine (COMPAZINE) 10 MG tablet     Review of Systems  Review of Systems  Constitutional: Negative.   HENT: Negative.    Cardiovascular: Negative.   Gastrointestinal: Negative.   Allergic/Immunologic: Negative.   Neurological: Negative.   Psychiatric/Behavioral: Negative.         Physical Exam  BP (!) 126/59 (BP Location: Right Arm, Patient Position: Sitting, Cuff Size: Normal)   Pulse 80   Temp (!) 97.2 F (36.2 C)   Wt 224 lb 8 oz (101.8 kg)   SpO2 100%   BMI 36.24 kg/m   Wt Readings from Last 5 Encounters:  06/10/22 224 lb 8 oz (101.8 kg)  06/08/22 225 lb 9.6 oz (102.3 kg)  05/25/22 227 lb 12.8 oz (103.3 kg)  05/14/22 235 lb 0.2 oz (106.6 kg)  05/11/22 235 lb (106.6 kg)     Physical Exam Vitals and nursing note reviewed.  Constitutional:      General: She is not in acute distress.     Appearance: She is well-developed.  Cardiovascular:     Rate and Rhythm: Normal rate and regular rhythm.  Pulmonary:     Effort: Pulmonary effort is normal.     Breath sounds: Normal breath sounds.  Neurological:     Mental Status: She is alert and oriented to person, place, and time.      Lab Results:  CBC    Component Value Date/Time   WBC 4.5 06/08/2022 2338   RBC 3.50 (L) 06/08/2022 2338   HGB 9.8 (L) 06/08/2022 2338   HGB 10.2 (L) 06/08/2022 0844   HCT 31.1 (L) 06/08/2022 2338   PLT 183 06/08/2022 2338   PLT 173 06/08/2022 0844   MCV 88.9 06/08/2022 2338  MCV 89.3 02/04/2014 1455   MCH 28.0 06/08/2022 2338   MCHC 31.5 06/08/2022 2338   RDW 15.1 06/08/2022 2338   LYMPHSABS 0.8 06/08/2022 2338   MONOABS 0.7 06/08/2022 2338   EOSABS 0.0 06/08/2022 2338   BASOSABS 0.0 06/08/2022 2338    BMET    Component Value Date/Time   NA 136 06/08/2022 2338   K 3.5 06/08/2022 2338   CL 108 06/08/2022 2338   CO2 23 06/08/2022 2338   GLUCOSE 141 (H) 06/08/2022 2338   BUN 13 06/08/2022 2338   CREATININE 0.73 06/08/2022 2338   CREATININE 0.65 06/08/2022 0844   CALCIUM 8.6 (L) 06/08/2022 2338   GFRNONAA >60 06/08/2022 2338   GFRNONAA >60 06/08/2022 0844    BNP No results found for: "BNP"  ProBNP No results found for: "PROBNP"  Imaging: DG Chest Port 1 View  Result Date: 06/08/2022 CLINICAL DATA:  Fever EXAM: PORTABLE CHEST 1 VIEW COMPARISON:  Chest x-ray 04/14/2005. FINDINGS: Right chest port catheter tip projects over the SVC. The heart size and mediastinal contours are within normal limits. Both lungs are clear. The visualized skeletal structures are unremarkable. IMPRESSION: No active disease. Electronically Signed   By: Ronney Asters M.D.   On: 06/08/2022 23:49     Assessment & Plan:   Type 2 diabetes mellitus without complication (HCC) - insulin glargine (LANTUS) 100 UNIT/ML Solostar Pen; Inject 32 Units into the skin daily.  Dispense: 15 mL; Refill: 11  -  diabetic diet  - was set up with libre freestyle glucose monitor in office today  - did have pharmacy consult during visit for diabetic medication management  Follow up:  Follow up 1 month or sooner if needed     Fenton Foy, NP 06/11/2022

## 2022-06-10 NOTE — Progress Notes (Signed)
Chief Complaint  Patient presents with   Diabetes   Hypertension    Sarah Davies is a 81 y.o. year old female who was referred for medication management by their primary care provider, Fenton Foy, NP. They presented for a face to face visit today.   They were referred to the pharmacist by their PCP for assistance in managing diabetes   Subjective:  Care Team: Primary Care Provider: Fenton Foy, NP ; Next Scheduled Visit: today  Medication Access/Adherence  Current Pharmacy:  [place] [facility] [place] [address] Phone: [phone] Fax: [phone]    Diabetes:  Current medications: glipizide XL 7.5 mg daily, Lantus 28 units daily   Date of Download: 06/10/22 Average Glucose: 353 mg/dL - 12 a- 6 a: 343  - 6 a - noon: 309 - noon- 6 pm: 386 - 6 pm- midnight: 392 Time in Goal:  - Time in range 70-180: 100% - Time above range: 0% - Time below range: 0%  Patient denies hypoglycemic s/sx including dizziness, shakiness, sweating. Patient denies hyperglycemic symptoms including polyuria, polydipsia, polyphagia, nocturia, neuropathy, blurred vision.   Health Maintenance  Health Maintenance Due  Topic Date Due   OPHTHALMOLOGY EXAM  Never done   TETANUS/TDAP  Never done   Zoster Vaccines- Shingrix (1 of 2) Never done   DEXA SCAN  Never done   Pneumonia Vaccine 74+ Years old (2 - PPSV23 or PCV20) 07/27/2017   COVID-19 Vaccine (3 - Pfizer risk series) 01/30/2020     Objective: Lab Results  Component Value Date   HGBA1C 13.0 (H) 03/22/2022    Lab Results  Component Value Date   CREATININE 0.73 06/08/2022   BUN 13 06/08/2022   NA 136 06/08/2022   K 3.5 06/08/2022   CL 108 06/08/2022   CO2 23 06/08/2022    No results found for: "CHOL", "HDL", "LDLCALC", "LDLDIRECT", "TRIG", "CHOLHDL"  Medications Reviewed Today     Reviewed by Schuyler Amor, CMA (Certified Medical Assistant)  on 06/10/22 at [place] List Status: <None>   Medication Order Taking? Sig Documenting Provider Last Dose Status Informant  ACCU-CHEK GUIDE test strip [id number]  PLEASE SEE ATTACHED FOR DETAILED DIRECTIONS [provider]  Active Self, Pharmacy Records  Accu-Chek Softclix Lancets lancets [id number] Yes 4 (four) times daily. [provider] Taking Active Self, Pharmacy Records  acetaminophen (TYLENOL) 500 MG tablet [id number] Yes Take 1,000 mg by mouth every 6 (six) hours as needed for moderate pain. [provider] Taking Active Self, Pharmacy Records  amLODipine (NORVASC) 10 MG tablet [id number] No Take 1 tablet (10 mg total) by mouth daily.  Patient not taking: Reported on 06/09/2022   Fenton Foy, NP Not Taking Active Self, Pharmacy Records  b complex vitamins tablet [id number] Yes Take 1 tablet by mouth daily. [provider] Taking Active Self, Pharmacy Records  blood glucose meter kit and supplies [id number] Yes Dispense based on patient and insurance preference. Use up to four times daily as directed. (FOR ICD-10 E10.9, E11.9). Elmarie Shiley, MD Taking Active Self, Pharmacy Records  calcium carbonate (OS-CAL) 600 MG TABS [id number] Yes Take 600 mg by mouth 2 (two) times daily with a meal. [provider] Taking Active Self, Pharmacy Records  carvedilol (COREG) 25 MG tablet [id number] Yes Take 50 mg by mouth 2 (two) times daily with a meal. [provider] Taking Active Self, Pharmacy Records  cholecalciferol (VITAMIN D) 1000 UNITS tablet [id number]  Yes Take 1,000 Units by mouth daily. [provider] Taking Active Self, Pharmacy Records  Continuous Blood Gluc Receiver (FREESTYLE LIBRE 2 READER) DEVI [id number] Yes Use to check blood sugar continuously - sent to DME supplier [facility] [phone]) Fenton Foy, NP Taking Active Self, Pharmacy Records  Continuous Blood Gluc Sensor ([place] 2 SENSOR) [place]  [id number] Yes Use to check blood sugar continuously - sent to DME supplier [facility] [phone]) Fenton Foy, NP Taking Active Self, Pharmacy Records  doxazosin (CARDURA) 4 MG tablet [id number] Yes Take 4 mg by mouth at bedtime.  [provider] Taking Active Self, Pharmacy Records  fluconazole (DIFLUCAN) 100 MG tablet [id number] No Take 200 mg by mouth today. Then 100 mg by mouth daily for 5 days.  Patient not taking: Reported on 06/08/2022   Ladell Pier, MD Not Taking Active Self, Pharmacy Records  furosemide (LASIX) 20 MG tablet [id number] Yes Take 20 mg by mouth daily.  [provider] Taking Active Self, Pharmacy Records  glipiZIDE (GLUCOTROL XL) 5 MG 24 hr tablet [id number] Yes Take 7.5 mg by mouth daily. [provider] Taking Active Self, Pharmacy Records  insulin glargine (LANTUS) 100 UNIT/ML Solostar Pen [id number] Yes Inject 28 Units into the skin daily. Fenton Foy, NP Taking Active Pharmacy Records  Insulin Pen Needle (PEN NEEDLES 3/16") 31G X 5 MM MISC [id number] Yes 1 Application by Does not apply route daily. Elmarie Shiley, MD Taking Active Self, Pharmacy Records  lidocaine-prilocaine (EMLA) cream [id number] Yes Apply 1 Application topically as needed. Ladell Pier, MD Taking Active Self, Pharmacy Records  lipase/protease/amylase (CREON) 36000 UNITS CPEP capsule [id number] Yes Take 1 capsule (36,000 Units total) by mouth 3 (three) times daily before meals. Owens Shark, NP Taking Active Pharmacy Records  lisinopril (ZESTRIL) 20 MG tablet [id number] Yes Take 20 mg by mouth daily. [provider] Taking Active Self, Pharmacy Records  Magnesium 500 MG TABS [id number] Yes Take 500 mg by mouth daily. [provider] Taking Active Self, Pharmacy Records  ondansetron (ZOFRAN) 4 MG tablet [id number] No Take 1 tablet (4 mg total) by mouth every 6 (six) hours as needed for nausea.  Patient not taking: Reported on 06/09/2022    Elmarie Shiley, MD Not Taking Active Self, Pharmacy Records  potassium chloride SA (KLOR-CON M) 20 MEQ tablet [id number] Yes Take 1 tablet (20 mEq total) by mouth daily. Owens Shark, NP Taking Active Pharmacy Records  prochlorperazine (COMPAZINE) 10 MG tablet [id number]  Take 1 tablet (10 mg total) by mouth every 6 (six) hours as needed for nausea or vomiting.  Patient not taking: Reported on 06/09/2022   Ladell Pier, MD  Active Self, Pharmacy Records              Assessment/Plan:   Diabetes: - Currently uncontrolled - Assisted setting up [facility] 2 app on her phone. Could not get phone to connect, so used reader to start.  - Recommend to increase Lantus to 32 units daily. PCP in agreement, placed order.  - Recommend to check glucose continuously with Elenor Legato   Follow Up Plan: phone call in 2 weeks  Catie Hedwig Morton, PharmD, [place] [phone]

## 2022-06-10 NOTE — Patient Instructions (Signed)
It was great talking to you today!  Increase Lantus to 32 units daily. Continue glipizide XL 7.5 mg daily.   Please call me with any questions!  Catie Hedwig Morton, PharmD, [facility] [phone]

## 2022-06-10 NOTE — Patient Instructions (Addendum)
1. Type 2 diabetes mellitus without complication, with long-term current use of insulin (HCC)  - insulin glargine (LANTUS) 100 UNIT/ML Solostar Pen; Inject 32 Units into the skin daily.  Dispense: 15 mL; Refill: 11  - diabetic diet  - was set up with libre freestyle glucose monitor in office today  - did have pharmacy consult during visit for diabetic medication management  Follow up:  Follow up 1 month or sooner if needed

## 2022-06-11 ENCOUNTER — Other Ambulatory Visit: Payer: Self-pay | Admitting: Pharmacist

## 2022-06-11 ENCOUNTER — Encounter: Payer: Self-pay | Admitting: Nurse Practitioner

## 2022-06-11 ENCOUNTER — Other Ambulatory Visit (HOSPITAL_BASED_OUTPATIENT_CLINIC_OR_DEPARTMENT_OTHER): Payer: Self-pay

## 2022-06-11 ENCOUNTER — Telehealth: Payer: Self-pay

## 2022-06-11 DIAGNOSIS — E1165 Type 2 diabetes mellitus with hyperglycemia: Secondary | ICD-10-CM

## 2022-06-11 NOTE — Assessment & Plan Note (Signed)
-   insulin glargine (LANTUS) 100 UNIT/ML Solostar Pen; Inject 32 Units into the skin daily.  Dispense: 15 mL; Refill: 11  - diabetic diet  - was set up with libre freestyle glucose monitor in office today  - did have pharmacy consult during visit for diabetic medication management  Follow up:  Follow up 1 month or sooner if needed

## 2022-06-11 NOTE — Progress Notes (Signed)
Care Coordination Call  At the end of our visit yesterday, patient asked about tightness along her right side. Discussed with Lazaro Arms. She recommended patient discuss with hem/onc team.   Contacted patient this morning. She now also reports some shooting nerve/lightning sensations down her arms. Encouraged to discuss with hem/onc team as well.   Catie Hedwig Morton, PharmD, [facility] [phone]

## 2022-06-11 NOTE — Telephone Encounter (Signed)
Patient called in stated she was having pain on the side. The pain felt like shooting electric shock.The pain started after treatment on 06/08/22. On the 06/08/22, she went to ER for a fever, I explained the fever came from the Gemzar. I advice the patient to take her pain medication and give [place] a call back with an update of the pain,

## 2022-06-14 LAB — CULTURE, BLOOD (ROUTINE X 2): Culture: NO GROWTH

## 2022-06-16 ENCOUNTER — Other Ambulatory Visit (HOSPITAL_BASED_OUTPATIENT_CLINIC_OR_DEPARTMENT_OTHER): Payer: Self-pay

## 2022-06-16 ENCOUNTER — Other Ambulatory Visit: Payer: Self-pay | Admitting: Pharmacist

## 2022-06-16 ENCOUNTER — Other Ambulatory Visit: Payer: Self-pay

## 2022-06-16 DIAGNOSIS — Z794 Long term (current) use of insulin: Secondary | ICD-10-CM

## 2022-06-16 DIAGNOSIS — C250 Malignant neoplasm of head of pancreas: Secondary | ICD-10-CM

## 2022-06-16 MED ORDER — INSULIN GLARGINE 100 UNIT/ML SOLOSTAR PEN: 20.0000 [IU] | PEN_INJECTOR | Freq: Every day | SUBCUTANEOUS | 11 refills | Status: DC

## 2022-06-16 MED ORDER — GLIPIZIDE 5 MG PO TABS
7.5000 mg | ORAL_TABLET | Freq: Every day | ORAL | 2 refills | Status: DC
  Filled 2022-06-16 (×2): qty 45, 30d supply, fill #0

## 2022-06-16 NOTE — Addendum Note (Signed)
Addended by: Kaylyn Layer T on: 06/16/2022 12:44 PM   Modules accepted: Orders

## 2022-06-16 NOTE — Progress Notes (Signed)
Chief Complaint  Patient presents with   Diabetes    Sarah Davies is a 81 y.o. year old female who presented for a telephone visit.   They were referred to the pharmacist by their PCP for assistance in managing diabetes.   Subjective:  Care Team: Primary Care Provider: Fenton Foy, NP ; Next Scheduled Visit: 07/15/22   Medication Access/Adherence  Current Pharmacy:  [place] [facility] [address] Phone: [phone] Fax: [phone]   Diabetes:  Current medications: Lantus 32 units daily, glipizide XL 7.5 mg daily  Patient called to report concerns about glucose readings being lower than they had previously been. She has held Lantus for the past 3 days due to concerns for hypoglycemia if she does take it. She has not changed diet significantly, no steroids    Past 7 days:  Average Glucose: 206 mg/dL - midnight- 6 am: 191 - 6 am - noon: 164 - noon - 6 pm: 228 - 6 pm - midnight: 237   Current meal patterns:  - Breakfast: egg and [place] bacon - Snack: popcicle - Lunch: yogurt  - Supper: roast beef sandwich, salad - Snacks: yogurt with last pill  - Drinks: water, sometimes hot tea   Health Maintenance  Health Maintenance Due  Topic Date Due   OPHTHALMOLOGY EXAM  Never done   TETANUS/TDAP  Never done   Zoster Vaccines- Shingrix (1 of 2) Never done   DEXA SCAN  Never done   Pneumonia Vaccine 44+ Years old (2 - PPSV23 or PCV20) 07/27/2017   COVID-19 Vaccine (3 - Pfizer risk series) 01/30/2020     Objective: Lab Results  Component Value Date   HGBA1C 13.0 (H) 03/22/2022    Lab Results  Component Value Date   CREATININE 0.73 06/08/2022   BUN 13 06/08/2022   NA 136 06/08/2022   K 3.5 06/08/2022   CL 108 06/08/2022   CO2 23 06/08/2022    No results found for: "CHOL", "HDL", "LDLCALC", "LDLDIRECT", "TRIG", "CHOLHDL"  Medications Reviewed Today     Reviewed by Fenton Foy, NP (Nurse Practitioner) on 06/11/22 at 1106  Med List Status: <None>   Medication Order Taking? Sig Documenting Provider Last Dose Status Informant  ACCU-CHEK GUIDE test strip [id number]  PLEASE SEE ATTACHED FOR DETAILED DIRECTIONS [provider]  Active Self, Pharmacy Records  Accu-Chek Softclix Lancets lancets [id number] Yes 4 (four) times daily. [provider] Taking Active Self, Pharmacy Records  acetaminophen (TYLENOL) 500 MG tablet [id number] Yes Take 1,000 mg by mouth every 6 (six) hours as needed for moderate pain. [provider] Taking Active Self, Pharmacy Records  amLODipine (NORVASC) 10 MG tablet [id number] No Take 1 tablet (10 mg total) by mouth daily.  Patient not taking: Reported on 06/09/2022   Fenton Foy, NP Not Taking Active Self, Pharmacy Records  b complex vitamins tablet [id number] Yes Take 1 tablet by mouth daily. [provider] Taking Active Self, Pharmacy Records  blood glucose meter kit and supplies [id number] Yes Dispense based on patient and insurance preference. Use up to four times daily as directed. (FOR ICD-10 E10.9, E11.9). Elmarie Shiley, MD Taking Active Self, Pharmacy Records  calcium carbonate (OS-CAL) 600 MG TABS [id number] Yes Take 600 mg by mouth 2 (two) times daily with a meal. [provider] Taking Active Self, Pharmacy Records  carvedilol (COREG) 25 MG tablet [id number]  Take 2 tablets (50 mg total) by mouth 2 (  two) times daily with a meal. Fenton Foy, NP  Active   cholecalciferol (VITAMIN D) 1000 UNITS tablet [id number] Yes Take 1,000 Units by mouth daily. [provider] Taking Active Self, Pharmacy Records  Continuous Blood Gluc Receiver (FREESTYLE LIBRE 2 READER) [place] [id number] Yes Use to check blood sugar continuously - sent to DME supplier [employer] [phone]) Fenton Foy, NP Taking Active Self, Pharmacy Records  Continuous Blood Gluc Sensor ([place] 2  SENSOR) [place] [id number] Yes Use to check blood sugar continuously - sent to DME supplier [employer] [phone]) Fenton Foy, NP Taking Active Self, Pharmacy Records  doxazosin (CARDURA) 4 MG tablet [id number] Yes Take 4 mg by mouth at bedtime.  [provider] Taking Active Self, Pharmacy Records  fluconazole (DIFLUCAN) 100 MG tablet [id number] No Take 200 mg by mouth today. Then 100 mg by mouth daily for 5 days.  Patient not taking: Reported on 06/08/2022   Ladell Pier, MD Not Taking Active Self, Pharmacy Records  furosemide (LASIX) 20 MG tablet [id number] Yes Take 20 mg by mouth daily.  [provider] Taking Active Self, Pharmacy Records  glipiZIDE (GLUCOTROL XL) 5 MG 24 hr tablet [id number] Yes Take 7.5 mg by mouth daily. [provider] Taking Active Self, Pharmacy Records  glipiZIDE (GLUCOTROL) 5 MG tablet [id number] Yes Take 1.5 tablets (7.5 mg total) by mouth 2 (two) times daily before a meal. Fenton Foy, NP  Active   insulin glargine (LANTUS) 100 UNIT/ML Solostar Pen [id number]  Inject 32 Units into the skin daily. Fenton Foy, NP  Active   Insulin Pen Needle (PEN NEEDLES 3/16") 31G X 5 MM MISC [id number] Yes 1 Application by Does not apply route daily. Elmarie Shiley, MD Taking Active Self, Pharmacy Records  lidocaine-prilocaine (EMLA) cream [id number] Yes Apply 1 Application topically as needed. Ladell Pier, MD Taking Active Self, Pharmacy Records  lipase/protease/amylase (CREON) 36000 UNITS CPEP capsule [id number] Yes Take 1 capsule (36,000 Units total) by mouth 3 (three) times daily before meals. Owens Shark, NP Taking Active Pharmacy Records  lisinopril (ZESTRIL) 20 MG tablet [id number] Yes Take 20 mg by mouth daily. [provider] Taking Active Self, Pharmacy Records  Magnesium 500 MG TABS [id number] Yes Take 500 mg by mouth daily. [provider] Taking Active Self, Pharmacy Records  ondansetron (ZOFRAN) 4 MG  tablet [id number] No Take 1 tablet (4 mg total) by mouth every 6 (six) hours as needed for nausea.  Patient not taking: Reported on 06/09/2022   Elmarie Shiley, MD Not Taking Active Self, Pharmacy Records  potassium chloride SA (KLOR-CON M) 20 MEQ tablet [id number] Yes Take 1 tablet (20 mEq total) by mouth daily. Owens Shark, NP Taking Active Pharmacy Records  prochlorperazine (COMPAZINE) 10 MG tablet [id number]  Take 1 tablet (10 mg total) by mouth every 6 (six) hours as needed for nausea or vomiting.  Patient not taking: Reported on 06/09/2022   Ladell Pier, MD  Active Self, Pharmacy Records  prochlorperazine (COMPAZINE) 10 MG tablet [id number]     Active             SDOH Interventions    Flowsheet Row Clinical Support from 04/07/2022 in [facility]  SDOH Interventions   Food Insecurity Interventions Intervention Not Indicated  Housing Interventions Intervention Not Indicated  Transportation Interventions Other (Comment)  Financial Strain Interventions Intervention Not Indicated  Stress Interventions Provide Counseling  Assessment/Plan:   Diabetes: - Currently uncontrolled, relaxed goal of glucose <200 given complex comorbidities - Discussed with PCP. Recommend reinitating Lantus but at lower dose of 20 units daily. Glipizide XL should be once daily. PCP in agreement, orders updated. Patient verbalizes understanding.   Follow Up Plan: phone call in 2 days  Catie TJodi Mourning, PharmD, [facility] [phone]

## 2022-06-18 ENCOUNTER — Other Ambulatory Visit: Payer: Medicare PPO | Admitting: Pharmacist

## 2022-06-18 ENCOUNTER — Other Ambulatory Visit (HOSPITAL_BASED_OUTPATIENT_CLINIC_OR_DEPARTMENT_OTHER): Payer: Self-pay

## 2022-06-18 DIAGNOSIS — E119 Type 2 diabetes mellitus without complications: Secondary | ICD-10-CM

## 2022-06-18 DIAGNOSIS — Z794 Long term (current) use of insulin: Secondary | ICD-10-CM

## 2022-06-18 MED ORDER — GLIPIZIDE 5 MG PO TABS
5.0000 mg | ORAL_TABLET | Freq: Two times a day (BID) | ORAL | 3 refills | Status: DC
  Filled 2022-06-18: qty 60, 30d supply, fill #0

## 2022-06-18 NOTE — Progress Notes (Signed)
Chief Complaint  Patient presents with   Diabetes    Sarah Davies is a 81 y.o. year old female who presented for a telephone visit.   They were referred to the pharmacist by their PCP for assistance in managing diabetes.    Subjective:  Care Team: Primary Care Provider: Fenton Foy, NP ; Next Scheduled Visit: 07/15/2022 Medication Access/Adherence  Current Pharmacy:  [place] at [facility] [address] [place] [address] Phone: [phone] Fax: [phone]   Diabetes:  Current medications: Lantus 20 units daily ~ 3pm, glipizide XL 7.5 mg QAM  Average Glucose: 215 185, 186, 239, 261 mg/dL  Fasting: ~110s  Reports concern that her glucose readings are almost 300 after supper.    Health Maintenance  Health Maintenance Due  Topic Date Due   OPHTHALMOLOGY EXAM  Never done   TETANUS/TDAP  Never done   Zoster Vaccines- Shingrix (1 of 2) Never done   DEXA SCAN  Never done   Pneumonia Vaccine 34+ Years old (2 - PPSV23 or PCV20) 07/27/2017   COVID-19 Vaccine (3 - Pfizer risk series) 01/30/2020     Objective: Lab Results  Component Value Date   HGBA1C 13.0 (H) 03/22/2022    Lab Results  Component Value Date   CREATININE 0.73 06/08/2022   BUN 13 06/08/2022   NA 136 06/08/2022   K 3.5 06/08/2022   CL 108 06/08/2022   CO2 23 06/08/2022    No results found for: "CHOL", "HDL", "LDLCALC", "LDLDIRECT", "TRIG", "CHOLHDL"  Medications Reviewed Today     Reviewed by Fenton Foy, NP (Nurse Practitioner) on 06/11/22 at 1106  Med List Status: <None>   Medication Order Taking? Sig Documenting Provider Last Dose Status Informant  ACCU-CHEK GUIDE test strip [id number]  PLEASE SEE ATTACHED FOR DETAILED DIRECTIONS [provider]  Active Self, Pharmacy Records  Accu-Chek Softclix Lancets lancets [id number] Yes 4 (four) times daily. [provider] Taking Active Self, Pharmacy Records   acetaminophen (TYLENOL) 500 MG tablet [id number] Yes Take 1,000 mg by mouth every 6 (six) hours as needed for moderate pain. [provider] Taking Active Self, Pharmacy Records  amLODipine (NORVASC) 10 MG tablet [id number] No Take 1 tablet (10 mg total) by mouth daily.  Patient not taking: Reported on 06/09/2022   Fenton Foy, NP Not Taking Active Self, Pharmacy Records  b complex vitamins tablet [id number] Yes Take 1 tablet by mouth daily. [provider] Taking Active Self, Pharmacy Records  blood glucose meter kit and supplies [id number] Yes Dispense based on patient and insurance preference. Use up to four times daily as directed. (FOR ICD-10 E10.9, E11.9). Elmarie Shiley, MD Taking Active Self, Pharmacy Records  calcium carbonate (OS-CAL) 600 MG TABS [id number] Yes Take 600 mg by mouth 2 (two) times daily with a meal. [provider] Taking Active Self, Pharmacy Records  carvedilol (COREG) 25 MG tablet [id number]  Take 2 tablets (50 mg total) by mouth 2 (two) times daily with a meal. Fenton Foy, NP  Active   cholecalciferol (VITAMIN D) 1000 UNITS tablet [id number] Yes Take 1,000 Units by mouth daily. [provider] Taking Active Self, Pharmacy Records  Continuous Blood Gluc Receiver (FREESTYLE LIBRE 2 READER) [place] [id number] Yes Use to check blood sugar continuously - sent to DME supplier [employer] [phone]) Fenton Foy, NP Taking Active Self, Pharmacy Records  Continuous Blood Gluc Sensor (FREESTYLE LIBRE 2 SENSOR) [place] [id number] Yes Use to check blood  sugar continuously - sent to DME supplier [employer] [phone]) Fenton Foy, NP Taking Active Self, Pharmacy Records  doxazosin (CARDURA) 4 MG tablet [id number] Yes Take 4 mg by mouth at bedtime.  [provider] Taking Active Self, Pharmacy Records  fluconazole (DIFLUCAN) 100 MG tablet [id number] No Take 200 mg by mouth today. Then 100 mg by mouth daily  for 5 days.  Patient not taking: Reported on 06/08/2022   Ladell Pier, MD Not Taking Active Self, Pharmacy Records  furosemide (LASIX) 20 MG tablet [id number] Yes Take 20 mg by mouth daily.  [provider] Taking Active Self, Pharmacy Records  glipiZIDE (GLUCOTROL XL) 5 MG 24 hr tablet [id number] Yes Take 7.5 mg by mouth daily. [provider] Taking Active Self, Pharmacy Records  glipiZIDE (GLUCOTROL) 5 MG tablet [id number] Yes Take 1.5 tablets (7.5 mg total) by mouth 2 (two) times daily before a meal. Fenton Foy, NP  Active   insulin glargine (LANTUS) 100 UNIT/ML Solostar Pen [id number]  Inject 32 Units into the skin daily. Fenton Foy, NP  Active   Insulin Pen Needle (PEN NEEDLES 3/16") 31G X 5 MM MISC [id number] Yes 1 Application by Does not apply route daily. Elmarie Shiley, MD Taking Active Self, Pharmacy Records  lidocaine-prilocaine (EMLA) cream [id number] Yes Apply 1 Application topically as needed. Ladell Pier, MD Taking Active Self, Pharmacy Records  lipase/protease/amylase (CREON) 36000 UNITS CPEP capsule [id number] Yes Take 1 capsule (36,000 Units total) by mouth 3 (three) times daily before meals. Owens Shark, NP Taking Active Pharmacy Records  lisinopril (ZESTRIL) 20 MG tablet [id number] Yes Take 20 mg by mouth daily. [provider] Taking Active Self, Pharmacy Records  Magnesium 500 MG TABS [id number] Yes Take 500 mg by mouth daily. [provider] Taking Active Self, Pharmacy Records  ondansetron (ZOFRAN) 4 MG tablet [id number] No Take 1 tablet (4 mg total) by mouth every 6 (six) hours as needed for nausea.  Patient not taking: Reported on 06/09/2022   Elmarie Shiley, MD Not Taking Active Self, Pharmacy Records  potassium chloride SA (KLOR-CON M) 20 MEQ tablet [id number] Yes Take 1 tablet (20 mEq total) by mouth daily. Owens Shark, NP Taking Active Pharmacy Records  prochlorperazine (COMPAZINE) 10 MG tablet [id number]  Take 1  tablet (10 mg total) by mouth every 6 (six) hours as needed for nausea or vomiting.  Patient not taking: Reported on 06/09/2022   Ladell Pier, MD  Active Self, Pharmacy Records  prochlorperazine (COMPAZINE) 10 MG tablet [id number]     Active               Assessment/Plan:   Diabetes: - Currently uncontrolled - [place] are controlled but with elevated post prandial. Would avoid use of XL glipizide at night with basal insulin in elderly patient due to risk of hypoglycemia. Recommend discontinue glipizide XL, change to glipizide IR 5 mg twice daily with breakfast and supper. Continue Lantus 20 units daily. Discussed with PCP, she is in agreement. Patient verbalizes understanding    Follow Up Plan: phone call in 1 week  Catie TJodi Mourning, PharmD, [facility] [phone]

## 2022-06-18 NOTE — Patient Instructions (Signed)
[place],   Stop glipizide XL.   Start glipizide 5 mg (immediate release) twice daily - take one ~15-30 minutes before breakfast and one ~15-30 minutes before supper. Continue Lantus 20 units daily.   Please let me know if you have any questions or concerns. Thanks!  Catie Hedwig Morton, PharmD, [facility] [phone]

## 2022-06-21 ENCOUNTER — Inpatient Hospital Stay: Payer: Medicare PPO

## 2022-06-21 ENCOUNTER — Encounter: Payer: Self-pay | Admitting: Nurse Practitioner

## 2022-06-21 ENCOUNTER — Inpatient Hospital Stay: Payer: Medicare PPO | Attending: Nurse Practitioner | Admitting: Nurse Practitioner

## 2022-06-21 VITALS — BP 159/75 | HR 69

## 2022-06-21 VITALS — BP 150/76 | HR 73 | Temp 98.2°F | Resp 18 | Ht 66.0 in | Wt 220.8 lb

## 2022-06-21 DIAGNOSIS — C250 Malignant neoplasm of head of pancreas: Secondary | ICD-10-CM | POA: Diagnosis not present

## 2022-06-21 DIAGNOSIS — Z5111 Encounter for antineoplastic chemotherapy: Secondary | ICD-10-CM | POA: Diagnosis not present

## 2022-06-21 DIAGNOSIS — T451X5A Adverse effect of antineoplastic and immunosuppressive drugs, initial encounter: Secondary | ICD-10-CM | POA: Insufficient documentation

## 2022-06-21 DIAGNOSIS — D6959 Other secondary thrombocytopenia: Secondary | ICD-10-CM | POA: Insufficient documentation

## 2022-06-21 LAB — CBC WITH DIFFERENTIAL (CANCER CENTER ONLY)
Abs Immature Granulocytes: 0.02 10*3/uL (ref 0.00–0.07)
Basophils Absolute: 0 10*3/uL (ref 0.0–0.1)
Basophils Relative: 1 %
Eosinophils Absolute: 0.1 10*3/uL (ref 0.0–0.5)
Eosinophils Relative: 3 %
HCT: 33.3 % — ABNORMAL LOW (ref 36.0–46.0)
Hemoglobin: 10.9 g/dL — ABNORMAL LOW (ref 12.0–15.0)
Immature Granulocytes: 1 %
Lymphocytes Relative: 17 %
Lymphs Abs: 0.6 10*3/uL — ABNORMAL LOW (ref 0.7–4.0)
MCH: 28 pg (ref 26.0–34.0)
MCHC: 32.7 g/dL (ref 30.0–36.0)
MCV: 85.6 fL (ref 80.0–100.0)
Monocytes Absolute: 0.9 10*3/uL (ref 0.1–1.0)
Monocytes Relative: 26 %
Neutro Abs: 1.9 10*3/uL (ref 1.7–7.7)
Neutrophils Relative %: 52 %
Platelet Count: 101 10*3/uL — ABNORMAL LOW (ref 150–400)
RBC: 3.89 MIL/uL (ref 3.87–5.11)
RDW: 15.3 % (ref 11.5–15.5)
WBC Count: 3.7 10*3/uL — ABNORMAL LOW (ref 4.0–10.5)
nRBC: 0 % (ref 0.0–0.2)

## 2022-06-21 LAB — CMP (CANCER CENTER ONLY)
ALT: 32 U/L (ref 0–44)
AST: 38 U/L (ref 15–41)
Albumin: 3.6 g/dL (ref 3.5–5.0)
Alkaline Phosphatase: 66 U/L (ref 38–126)
Anion gap: 9 (ref 5–15)
BUN: 12 mg/dL (ref 8–23)
CO2: 27 mmol/L (ref 22–32)
Calcium: 9.2 mg/dL (ref 8.9–10.3)
Chloride: 99 mmol/L (ref 98–111)
Creatinine: 0.69 mg/dL (ref 0.44–1.00)
GFR, Estimated: >60 mL/min (ref >60)
Glucose, Bld: 214 mg/dL — ABNORMAL HIGH (ref 70–99)
Potassium: 4 mmol/L (ref 3.5–5.1)
Sodium: 135 mmol/L (ref 135–145)
Total Bilirubin: 0.7 mg/dL (ref 0.3–1.2)
Total Protein: 7.4 g/dL (ref 6.5–8.1)

## 2022-06-21 MED ORDER — SODIUM CHLORIDE 0.9 % IV SOLN
800.0000 mg/m2 | Freq: Once | INTRAVENOUS | Status: AC
  Administered 2022-06-21: 1748 mg via INTRAVENOUS
  Filled 2022-06-21: qty 26.3

## 2022-06-21 MED ORDER — PROCHLORPERAZINE MALEATE 10 MG PO TABS
10.0000 mg | ORAL_TABLET | Freq: Once | ORAL | Status: AC
  Administered 2022-06-21: 10 mg via ORAL
  Filled 2022-06-21: qty 1

## 2022-06-21 MED ORDER — HEPARIN SOD (PORK) LOCK FLUSH 100 UNIT/ML IV SOLN
500.0000 [IU] | Freq: Once | INTRAVENOUS | Status: AC | PRN
  Administered 2022-06-21: 500 [IU]

## 2022-06-21 MED ORDER — SODIUM CHLORIDE 0.9% FLUSH
10.0000 mL | INTRAVENOUS | Status: DC | PRN
  Administered 2022-06-21: 10 mL

## 2022-06-21 MED ORDER — SODIUM CHLORIDE 0.9 % IV SOLN: Freq: Once | INTRAVENOUS | Status: AC

## 2022-06-21 MED ORDER — PACLITAXEL PROTEIN-BOUND CHEMO INJECTION 100 MG
80.0000 mg/m2 | Freq: Once | INTRAVENOUS | Status: AC
  Administered 2022-06-21: 175 mg via INTRAVENOUS
  Filled 2022-06-21: qty 35

## 2022-06-21 NOTE — Progress Notes (Signed)
Patient seen by Ned Card NP today  Vitals are within treatment parameters.  Labs reviewed by Ned Card NP and are not all within treatment parameters. Platelet count 101  Per physician team, patient is ready for treatment. 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

## 2022-06-21 NOTE — Patient Instructions (Signed)
[facility]   Discharge Instructions: Thank you for choosing [facility] to provide your oncology and hematology care.   If you have a lab appointment with the [facility], please go directly to the [facility] and check in at the registration area.   Wear comfortable clothing and clothing appropriate for easy access to any Portacath or PICC line.   We strive to give you quality time with your provider. You may need to reschedule your appointment if you arrive late (15 or more minutes).  Arriving late affects you and other patients whose appointments are after yours.  Also, if you miss three or more appointments without notifying the office, you may be dismissed from the clinic at the provider's discretion.      For prescription refill requests, have your pharmacy contact our office and allow 72 hours for refills to be completed.    Today you received the following chemotherapy and/or immunotherapy agents Paclitaxel-protein bound (ABRAXANE) & Gemcitabine (GEMZAR).      To help prevent nausea and vomiting after your treatment, we encourage you to take your nausea medication as directed.  BELOW ARE SYMPTOMS THAT SHOULD BE REPORTED IMMEDIATELY: *FEVER GREATER THAN 100.4 F (38 C) OR HIGHER *CHILLS OR SWEATING *NAUSEA AND VOMITING THAT IS NOT CONTROLLED WITH YOUR NAUSEA MEDICATION *UNUSUAL SHORTNESS OF BREATH *UNUSUAL BRUISING OR BLEEDING *URINARY PROBLEMS (pain or burning when urinating, or frequent urination) *BOWEL PROBLEMS (unusual diarrhea, constipation, pain near the anus) TENDERNESS IN MOUTH AND THROAT WITH OR WITHOUT PRESENCE OF ULCERS (sore throat, sores in mouth, or a toothache) UNUSUAL RASH, SWELLING OR PAIN  UNUSUAL VAGINAL DISCHARGE OR ITCHING   Items with * indicate a potential emergency and should be followed up as soon as possible or go to the Emergency Department if any problems should occur.  Please show the CHEMOTHERAPY ALERT  CARD or IMMUNOTHERAPY ALERT CARD at check-in to the Emergency Department and triage nurse.  Should you have questions after your visit or need to cancel or reschedule your appointment, please contact [facility]  Dept: [phone]  and follow the prompts.  Office hours are 8:00 a.m. to 4:30 p.m. Monday - Friday. Please note that voicemails left after 4:00 p.m. may not be returned until the following business day.  We are closed weekends and major holidays. You have access to a nurse at all times for urgent questions. Please call the main number to the clinic Dept: [phone] and follow the prompts.   For any non-urgent questions, you may also contact your provider using MyChart. We now offer e-Visits for anyone 60 and older to request care online for non-urgent symptoms. For details visit [email or web address].   Also download the MyChart app! Go to the app store, search "MyChart", open the app, select [facility], and log in with your MyChart username and password.  Masks are optional in the cancer centers. If you would like for your care team to wear a mask while they are taking care of you, please let them know. You may have one support person who is at least 80 years old accompany you for your appointments.  Paclitaxel Nanoparticle Albumin-Bound Injection What is this medication? NANOPARTICLE ALBUMIN-BOUND PACLITAXEL (Na no PAHR ti kuhl al BYOO muhn-bound PAK li TAX el) treats some types of cancer. It works by slowing down the growth of cancer cells. This medicine may be used for other purposes; ask your health care provider or pharmacist if you  have questions. COMMON BRAND NAME(S): Abraxane What should I tell my care team before I take this medication? They need to know if you have any of these conditions: Liver disease Low white blood cell levels An unusual or allergic reaction to paclitaxel, albumin, other medications, foods, dyes, or preservatives If you  or your partner are pregnant or trying to get pregnant Breast-feeding How should I use this medication? This medication is injected into a vein. It is given by your care team in a hospital or clinic setting. Talk to your care team about the use of this medication in children. Special care may be needed. Overdosage: If you think you have taken too much of this medicine contact a poison control center or emergency room at once. NOTE: This medicine is only for you. Do not share this medicine with others. What if I miss a dose? Keep appointments for follow-up doses. It is important not to miss your dose. Call your care team if you are unable to keep an appointment. What may interact with this medication? Other medications may affect the way this medication works. Talk with your care team about all of the medications you take. They may suggest changes to your treatment plan to lower the risk of side effects and to make sure your medications work as intended. This list may not describe all possible interactions. Give your health care provider a list of all the medicines, herbs, non-prescription drugs, or dietary supplements you use. Also tell them if you smoke, drink alcohol, or use illegal drugs. Some items may interact with your medicine. What should I watch for while using this medication? Your condition will be monitored carefully while you are receiving this medication. You may need blood work while taking this medication. This medication may make you feel generally unwell. This is not uncommon as chemotherapy can affect healthy cells as well as cancer cells. Report any side effects. Continue your course of treatment even though you feel ill unless your care team tells you to stop. This medication can cause serious allergic reactions. To reduce the risk, your care team may give you other medications to take before receiving this one. Be sure to follow the directions from your care team. This  medication may increase your risk of getting an infection. Call your care team for advice if you get a fever, chills, sore throat, or other symptoms of a cold or flu. Do not treat yourself. Try to avoid being around people who are sick. This medication may increase your risk to bruise or bleed. Call your care team if you notice any unusual bleeding. Be careful brushing or flossing your teeth or using a toothpick because you may get an infection or bleed more easily. If you have any dental work done, tell your dentist you are receiving this medication. Talk to your care team if you or your partner may be pregnant. Serious birth defects can occur if you take this medication during pregnancy and for 6 months after the last dose. You will need a negative pregnancy test before starting this medication. Contraception is recommended while taking this medication and for 6 months after the last dose. Your care team can help you find the option that works for you. If your partner can get pregnant, use a condom during sex while taking this medication and for 3 months after the last dose. Do not breastfeed while taking this medication and for 2 weeks after the last dose. This medication may cause infertility. Talk  to your care team if you are concerned about your fertility. What side effects may I notice from receiving this medication? Side effects that you should report to your care team as soon as possible: Allergic reactions--skin rash, itching, hives, swelling of the face, lips, tongue, or throat Dry cough, shortness of breath or trouble breathing Infection--fever, chills, cough, sore throat, wounds that don't heal, pain or trouble when passing urine, general feeling of discomfort or being unwell Low red blood cell level--unusual weakness or fatigue, dizziness, headache, trouble breathing Pain, tingling, or numbness in the hands or feet Stomach pain, unusual weakness or fatigue, nausea, vomiting, diarrhea, or  fever that lasts longer than expected Unusual bruising or bleeding Side effects that usually do not require medical attention (report to your care team if they continue or are bothersome): Diarrhea Fatigue Hair loss Loss of appetite Nausea Vomiting This list may not describe all possible side effects. Call your doctor for medical advice about side effects. You may report side effects to FDA at 1-800-FDA-1088. Where should I keep my medication? This medication is given in a hospital or clinic. It will not be stored at home. NOTE: This sheet is a summary. It may not cover all possible information. If you have questions about this medicine, talk to your doctor, pharmacist, or health care provider.  2023 Elsevier/Gold Standard (2022-01-27 00:00:00)  Gemcitabine Injection What is this medication? GEMCITABINE (jem SYE ta been) treats some types of cancer. It works by slowing down the growth of cancer cells. This medicine may be used for other purposes; ask your health care provider or pharmacist if you have questions. COMMON BRAND NAME(S): Gemzar, Infugem What should I tell my care team before I take this medication? They need to know if you have any of these conditions: Blood disorders Infection Kidney disease Liver disease Lung or breathing disease, such as asthma or COPD Recent or ongoing radiation therapy An unusual or allergic reaction to gemcitabine, other medications, foods, dyes, or preservatives If you or your partner are pregnant or trying to get pregnant Breast-feeding How should I use this medication? This medication is injected into a vein. It is given by your care team in a hospital or clinic setting. Talk to your care team about the use of this medication in children. Special care may be needed. Overdosage: If you think you have taken too much of this medicine contact a poison control center or emergency room at once. NOTE: This medicine is only for you. Do not share this  medicine with others. What if I miss a dose? Keep appointments for follow-up doses. It is important not to miss your dose. Call your care team if you are unable to keep an appointment. What may interact with this medication? Interactions have not been studied. This list may not describe all possible interactions. Give your health care provider a list of all the medicines, herbs, non-prescription drugs, or dietary supplements you use. Also tell them if you smoke, drink alcohol, or use illegal drugs. Some items may interact with your medicine. What should I watch for while using this medication? Your condition will be monitored carefully while you are receiving this medication. This medication may make you feel generally unwell. This is not uncommon, as chemotherapy can affect healthy cells as well as cancer cells. Report any side effects. Continue your course of treatment even though you feel ill unless your care team tells you to stop. In some cases, you may be given additional medications to help  with side effects. Follow all directions for their use. This medication may increase your risk of getting an infection. Call your care team for advice if you get a fever, chills, sore throat, or other symptoms of a cold or flu. Do not treat yourself. Try to avoid being around people who are sick. This medication may increase your risk to bruise or bleed. Call your care team if you notice any unusual bleeding. Be careful brushing or flossing your teeth or using a toothpick because you may get an infection or bleed more easily. If you have any dental work done, tell your dentist you are receiving this medication. Avoid taking medications that contain aspirin, acetaminophen, ibuprofen, naproxen, or ketoprofen unless instructed by your care team. These medications may hide a fever. Talk to your care team if you or your partner wish to become pregnant or think you might be pregnant. This medication can cause  serious birth defects if taken during pregnancy and for 6 months after the last dose. A negative pregnancy test is required before starting this medication. A reliable form of contraception is recommended while taking this medication and for 6 months after the last dose. Talk to your care team about effective forms of contraception. Do not father a child while taking this medication and for 3 months after the last dose. Use a condom while having sex during this time period. Do not breastfeed while taking this medication and for at least 1 week after the last dose. This medication may cause infertility. Talk to your care team if you are concerned about your fertility. What side effects may I notice from receiving this medication? Side effects that you should report to your care team as soon as possible: Allergic reactions--skin rash, itching, hives, swelling of the face, lips, tongue, or throat Capillary leak syndrome--stomach or muscle pain, unusual weakness or fatigue, feeling faint or lightheaded, decrease in the amount of urine, swelling of the ankles, hands, or feet, trouble breathing Infection--fever, chills, cough, sore throat, wounds that don't heal, pain or trouble when passing urine, general feeling of discomfort or being unwell Liver injury--right upper belly pain, loss of appetite, nausea, light-colored stool, dark yellow or brown urine, yellowing skin or eyes, unusual weakness or fatigue Low red blood cell level--unusual weakness or fatigue, dizziness, headache, trouble breathing Lung injury--shortness of breath or trouble breathing, cough, spitting up blood, chest pain, fever Stomach pain, bloody diarrhea, pale skin, unusual weakness or fatigue, decrease in the amount of urine, which may be signs of hemolytic uremic syndrome Sudden and severe headache, confusion, change in vision, seizures, which may be signs of posterior reversible encephalopathy syndrome (PRES) Unusual bruising or  bleeding Side effects that usually do not require medical attention (report to your care team if they continue or are bothersome): Diarrhea Drowsiness Hair loss Nausea Pain, redness, or swelling with sores inside the mouth or throat Vomiting This list may not describe all possible side effects. Call your doctor for medical advice about side effects. You may report side effects to FDA at 1-800-FDA-1088. Where should I keep my medication? This medication is given in a hospital or clinic. It will not be stored at home. NOTE: This sheet is a summary. It may not cover all possible information. If you have questions about this medicine, talk to your doctor, pharmacist, or health care provider.  2023 Elsevier/Gold Standard (2022-01-27 00:00:00)

## 2022-06-21 NOTE — Progress Notes (Signed)
Thrombocytopenic today; reduce Abraxane dose to 80 mg/m2 & Gemcitabine dose to 800 mg/m2 per Ned Card, NP.  Kennith Center, Pharm.D., CPP 06/21/2022'@10'$ :26 AM

## 2022-06-21 NOTE — Progress Notes (Signed)
[facility] OFFICE PROGRESS NOTE   Diagnosis: Pancreas cancer  INTERVAL HISTORY:   Sarah Davies returns as scheduled.  She completed cycle 4 gemcitabine/Abraxane 06/08/2022.  She was evaluated in the emergency department that night due to onset of fever and somnolence.  Temperature at home reported to be 102.  Temperature in emergency department 100.8.  Blood and urine culture negative.  Chest x-ray negative.  She had no further fever.  No shaking chills.  She has had a mild cough over the past few days.  No sore throat.  No shortness of breath.  No nasal congestion.  She denies nausea/vomiting.  No mouth sores.  No diarrhea.  Objective:  Vital signs in last 24 hours:  Blood pressure (!) 150/76, pulse 73, temperature 98.2 F (36.8 C), temperature source Oral, resp. rate 18, height '5\' 6"'$  (1.676 m), weight 220 lb 12.8 oz (100.2 kg), SpO2 98 %.    HEENT: No thrush or ulcers. Resp: Lungs clear bilaterally. Cardio: Regular rate and rhythm. GI: Abdomen soft and nontender.  No hepatosplenomegaly. Vascular: Trace bilateral ankle edema. Neuro: Alert and oriented. Skin: No rash. Port-A-Cath without erythema.   Lab Results:  Lab Results  Component Value Date   WBC 3.7 (L) 06/21/2022   HGB 10.9 (L) 06/21/2022   HCT 33.3 (L) 06/21/2022   MCV 85.6 06/21/2022   PLT 101 (L) 06/21/2022   NEUTROABS 1.9 06/21/2022    Imaging:  No results found.  Medications: I have reviewed the patient's current medications.  Assessment/Plan:  Pancreas cancer 03/22/2022 CT abdomen/pelvis-ill-defined 3.6 x 2.5 cm hypodense mass in the uncinate process of the pancreas; possible associated acute pancreatitis; fatty liver; faint hypodense focus in the inferior right lobe of the liver too small to characterize; no intrahepatic biliary dilatation.   03/23/2022 MRI-hypoenhancing mass in the uncinate process/inferior head of the pancreas abutting the SMV with distortion of the SMV, less than 180  degree involvement by MR but with limited assessment; pancreatic duct narrowing/obstruction and early involvement of the distal common bile duct; subtle hepatic lesion in the right hepatic lobe; hazy mesentery may reflect concomitant mesenteritis or lymphatic congestion; small lymph nodes adjacent to the masslike area in the head of the pancreas. 03/26/2022 upper EUS-mass identified in the pancreatic head and uncinate process, appearance consistent with adenocarcinoma.  FNA pancreas head showed malignant cells consistent with adenocarcinoma. 04/07/2022 CT abdomen/pelvis pancreatic protocol-adenocarcinoma within the pancreatic uncinate process, isolated right hepatic lobe lesion suspicious for metastatic disease, contact of the SMV by tumor over an approximately 180 degree span with no evidence of occlusion and no arterial involvement, nonspecific borderline enlarged portacaval lymph node, small bowel mesenteric nodes most likely related to mesenteric adenitis/panniculitis Cycle 1 gemcitabine/Abraxane 04/27/2022 Cycle 2 gemcitabine/Abraxane 05/11/2022 Cycle 3 gemcitabine/Abraxane 05/25/2022 Cycle 4 gemcitabine/Abraxane 06/08/2022 Cycle 5 gemcitabine/Abraxane 06/21/2022, gemcitabine and Abraxane dose reduced due to thrombocytopenia Diabetes with recent poor control Diarrhea, typically after eating Diabetic neuropathy Hypertension Hyperlipidemia Leg edema-04/06/2022 bilateral lower extremities negative for DVT Severe oropharyngeal candidiasis 05/03/2022-Diflucan 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  Fever 07/09/2022 after chemotherapy-evaluated in emergency department, cultures and chest x-ray negative; fever potentially related to Gemcitabine     Disposition: Sarah Davies appears stable.  She has completed 4 cycles of gemcitabine/Abraxane.  Plan to proceed with cycle 5 today as scheduled.   Gemcitabine and Abraxane dose reduced due to thrombocytopenia.    CBC and chemistry panel  reviewed.  Labs adequate to proceed with treatment.  As noted above chemotherapy dose reduced due to mild to moderate thrombocytopenia.    She had a fever the night of cycle 4.  This could have been related to Gemcitabine.  She will contact the office with further fever.  She will return for lab, follow-up, cycle 6 gemcitabine/Abraxane in 2 weeks.  We are available to see her sooner if needed.    Ned Card ANP/GNP-BC   06/21/2022  10:45 AM

## 2022-06-22 ENCOUNTER — Other Ambulatory Visit: Payer: Self-pay | Admitting: Nurse Practitioner

## 2022-06-22 ENCOUNTER — Telehealth: Payer: Self-pay

## 2022-06-22 ENCOUNTER — Other Ambulatory Visit (HOSPITAL_COMMUNITY): Payer: Self-pay

## 2022-06-22 DIAGNOSIS — C250 Malignant neoplasm of head of pancreas: Secondary | ICD-10-CM

## 2022-06-22 LAB — CANCER ANTIGEN 19-9: CA 19-9: 156 U/mL — ABNORMAL HIGH (ref 0–35)

## 2022-06-22 MED ORDER — TRAMADOL HCL 50 MG PO TABS: 50.0000 mg | ORAL_TABLET | Freq: Four times a day (QID) | ORAL | 0 refills | Status: DC | PRN

## 2022-06-22 NOTE — Telephone Encounter (Signed)
Sarah Davies called in and requested a refill of her Tramadol, the request was placed on the Lisa's desk.

## 2022-06-24 ENCOUNTER — Other Ambulatory Visit: Payer: Medicare PPO | Admitting: Pharmacist

## 2022-06-24 NOTE — Patient Instructions (Signed)
[facility],   Here is the website for how to [place]:   [email or web address].abbott/us-en/products/freestyle-libre-[email or web address].html  Let me know if you have any questions or concerns. Thanks!  Catie Hedwig Morton, PharmD, [facility] [phone]

## 2022-06-24 NOTE — Progress Notes (Signed)
Chief Complaint  Patient presents with   Diabetes    Sarah Davies is a 81 y.o. year old female who presented for a telephone visit.   They were referred to the pharmacist by their PCP for assistance in managing diabetes.   Subjective:  Care Team: Primary Care Provider: Fenton Foy, NP ; Next Scheduled Visit: 07/15/22  Medication Access/Adherence  Current Pharmacy:  [place] at [facility] [place] [address] Phone: [phone] Fax: [phone]   Patient reports affordability concerns with their medications: No  Patient reports access/transportation concerns to their pharmacy: No  Patient reports adherence concerns with their medications:  No     Diabetes:  Current medications: Lantus 20 units daily, glipizide 5 mg twice daily  Date of Download: last 7 days Average Glucose: 246 mg/dL - 12-6: 228 - 6-noon: 221 - noon- 6 pm: 273 - 6 pm- 276  Patient denies hypoglycemic s/sx including dizziness, shakiness, sweating. Patient denies hyperglycemic symptoms including polyuria, polydipsia, polyphagia, nocturia, neuropathy, blurred vision.  Reports significant elevations after chemotherapy on Monday. Reports a fever Monday night.    Health Maintenance  Health Maintenance Due  Topic Date Due   OPHTHALMOLOGY EXAM  Never done   Diabetic kidney evaluation - Urine ACR  Never done   TETANUS/TDAP  Never done   Zoster Vaccines- Shingrix (1 of 2) Never done   DEXA SCAN  Never done   Pneumonia Vaccine 9+ Years old (2 - PPSV23 or PCV20) 07/27/2017   COVID-19 Vaccine (3 - Pfizer risk series) 01/30/2020     Objective: Lab Results  Component Value Date   HGBA1C 13.0 (H) 03/22/2022    Lab Results  Component Value Date   CREATININE 0.69 06/21/2022   BUN 12 06/21/2022   NA 135 06/21/2022   K 4.0 06/21/2022   CL 99 06/21/2022   CO2 27 06/21/2022    No results found for: "CHOL", "HDL", "LDLCALC",  "LDLDIRECT", "TRIG", "CHOLHDL"  Medications Reviewed Today     Reviewed by Georgianne Fick, RN (Registered Nurse) on 06/21/22 at 1108  Med List Status: <None>   Medication Order Taking? Sig Documenting Provider Last Dose Status Informant  ACCU-CHEK GUIDE test strip [id number] No PLEASE SEE ATTACHED FOR DETAILED DIRECTIONS [provider] Taking Active Self, Pharmacy Records  Accu-Chek Softclix Lancets lancets [id number] No 4 (four) times daily. [provider] Taking Active Self, Pharmacy Records  acetaminophen (TYLENOL) 500 MG tablet [id number] No Take 1,000 mg by mouth every 6 (six) hours as needed for moderate pain. [provider] Taking Active Self, Pharmacy Records  amLODipine (NORVASC) 10 MG tablet [id number] No Take 1 tablet (10 mg total) by mouth daily.  Patient not taking: Reported on 06/09/2022   Fenton Foy, NP Not Taking Active Self, Pharmacy Records  b complex vitamins tablet [id number] No Take 1 tablet by mouth daily. [provider] Taking Active Self, Pharmacy Records  blood glucose meter kit and supplies [id number] No Dispense based on patient and insurance preference. Use up to four times daily as directed. (FOR ICD-10 E10.9, E11.9). Regalado, Cassie Freer, MD Taking Active Self, Pharmacy Records  calcium carbonate (OS-CAL) 600 MG TABS [id number] No Take 600 mg by mouth 2 (two) times daily with a meal. [provider] Taking Active Self, Pharmacy Records  carvedilol (COREG) 25 MG tablet [id number] No Take 2 tablets (50 mg total) by mouth 2 (two) times daily with a meal. Fenton Foy, NP Taking Active  cholecalciferol (VITAMIN D) 1000 UNITS tablet [id number] No Take 1,000 Units by mouth daily. [provider] Taking Active Self, Pharmacy Records  Continuous Blood Gluc Receiver (FREESTYLE LIBRE 2 READER) [place] [id number] No Use to check blood sugar continuously - sent to DME supplier [employer] [phone])  Fenton Foy, NP Taking Active Self, Pharmacy Records  Continuous Blood Gluc Sensor (FREESTYLE LIBRE 2 SENSOR) [place] [id number] No Use to check blood sugar continuously - sent to DME supplier [employer] [phone]) Fenton Foy, NP Taking Active Self, Pharmacy Records  doxazosin (CARDURA) 4 MG tablet [id number] No Take 4 mg by mouth at bedtime.  [provider] Taking Active Self, Pharmacy Records  fluconazole (DIFLUCAN) 100 MG tablet [id number] No Take 200 mg by mouth today. Then 100 mg by mouth daily for 5 days.  Patient not taking: Reported on 06/08/2022   Ladell Pier, MD Not Taking Active Self, Pharmacy Records  furosemide (LASIX) 20 MG tablet [id number] No Take 20 mg by mouth daily.  [provider] Taking Active Self, Pharmacy Records  glipiZIDE (GLUCOTROL) 5 MG tablet [id number] No Take 1 tablet (5 mg total) by mouth 2 (two) times daily with a meal. Fenton Foy, NP Taking Active   insulin glargine (LANTUS) 100 UNIT/ML Solostar Pen [id number] No Inject 20 Units into the skin daily. Fenton Foy, NP Taking Active   Insulin Pen Needle (PEN NEEDLES 3/16") 31G X 5 MM MISC [id number] No 1 Application by Does not apply route daily. Elmarie Shiley, MD Taking Active Self, Pharmacy Records  lidocaine-prilocaine (EMLA) cream [id number] No Apply 1 Application topically as needed. Ladell Pier, MD Taking Active Self, Pharmacy Records  lipase/protease/amylase (CREON) 36000 UNITS CPEP capsule [id number] No Take 1 capsule (36,000 Units total) by mouth 3 (three) times daily before meals. Owens Shark, NP Taking Active Pharmacy Records  lisinopril (ZESTRIL) 20 MG tablet [id number] No Take 20 mg by mouth daily. [provider] Taking Active Self, Pharmacy Records  Magnesium 500 MG TABS [id number] No Take 500 mg by mouth daily. [provider] Taking Active Self, Pharmacy Records  ondansetron (ZOFRAN) 4 MG tablet [id number] No Take 1 tablet (4 mg  total) by mouth every 6 (six) hours as needed for nausea.  Patient not taking: Reported on 06/09/2022   Elmarie Shiley, MD Not Taking Active Self, Pharmacy Records  potassium chloride SA (KLOR-CON M) 20 MEQ tablet [id number] No Take 1 tablet (20 mEq total) by mouth daily. Owens Shark, NP Taking Active Pharmacy Records  prochlorperazine (COMPAZINE) 10 MG tablet [id number] No Take 1 tablet (10 mg total) by mouth every 6 (six) hours as needed for nausea or vomiting.  Patient not taking: Reported on 06/09/2022   Ladell Pier, MD Not Taking Active Self, Pharmacy Records              Assessment/Plan:   Diabetes: - Currently uncontrolled but improving post chemotherapy.  - Given relative control last week on this regimen on a non-chemo week, will continue current regimen at this time. Will plan to talk to patient on Monday to see if adjustments needed. Will also plan to talk after oncology app on 9/26 to see if chemo was given and determine how to adjust regimen - Patient plans to start today's sensor on her phone, so that readings can be seen via [place]: phone call in 5 days   Catie Hedwig Morton, PharmD, BCACP  [facility] [phone]

## 2022-06-28 ENCOUNTER — Other Ambulatory Visit: Payer: Medicare PPO | Admitting: Pharmacist

## 2022-06-28 DIAGNOSIS — E119 Type 2 diabetes mellitus without complications: Secondary | ICD-10-CM

## 2022-06-28 MED ORDER — GLIPIZIDE 5 MG PO TABS: 7.5000 mg | ORAL_TABLET | Freq: Two times a day (BID) | ORAL | 2 refills | Status: DC

## 2022-06-28 NOTE — Progress Notes (Signed)
Chief Complaint  Patient presents with   Diabetes   Medication Management    Sarah Davies is a 81 y.o. year old female who presented for a telephone visit.   They were referred to the pharmacist by their PCP for assistance in managing diabetes.   Subjective:  Care Team: Primary Care Provider: Fenton Foy, NP ;   Medication Access/Adherence  Current Pharmacy:  [place] at [facility] [place] [address] Phone: [phone] Fax: [phone]   Patient reports affordability concerns with their medications: No  Patient reports access/transportation concerns to their pharmacy: No  Patient reports adherence concerns with their medications:  No     Diabetes:  Current medications: Lantus 20 units daily, glipizide 5 mg twice daily  Date of Download: last 7 days Average Glucose: 247 mg/dL - fastings 120-140, 132 this morning this morning; 2 hour post prandial 251- consistently mid to upper 200s after breakfast and after supper  Denies hypoglycemia  Reports that she tried to start CGM with her phone on Thursday afternoon, but she was unable to get it to connect, so she is using the reader    Health Maintenance  Health Maintenance Due  Topic Date Due   OPHTHALMOLOGY EXAM  Never done   Diabetic kidney evaluation - Urine ACR  Never done   TETANUS/TDAP  Never done   Zoster Vaccines- Shingrix (1 of 2) Never done   DEXA SCAN  Never done   Pneumonia Vaccine 33+ Years old (2 - PPSV23 or PCV20) 07/27/2017   COVID-19 Vaccine (3 - Pfizer risk series) 01/30/2020     Objective: Lab Results  Component Value Date   HGBA1C 13.0 (H) 03/22/2022    Lab Results  Component Value Date   CREATININE 0.69 06/21/2022   BUN 12 06/21/2022   NA 135 06/21/2022   K 4.0 06/21/2022   CL 99 06/21/2022   CO2 27 06/21/2022    No results found for: "CHOL", "HDL", "LDLCALC", "LDLDIRECT", "TRIG", "CHOLHDL"  Medications  Reviewed Today     Reviewed by Osker Mason, RPH-CPP (Pharmacist) on 06/24/22 at 1017  Med List Status: <None>   Medication Order Taking? Sig Documenting Provider Last Dose Status Informant  ACCU-CHEK GUIDE test strip [id number]  PLEASE SEE ATTACHED FOR DETAILED DIRECTIONS [provider]  Active Self, Pharmacy Records  Accu-Chek Softclix Lancets lancets [id number]  4 (four) times daily. [provider]  Active Self, Pharmacy Records  acetaminophen (TYLENOL) 500 MG tablet [id number]  Take 1,000 mg by mouth every 6 (six) hours as needed for moderate pain. [provider]  Active Self, Pharmacy Records  amLODipine (NORVASC) 10 MG tablet [id number]  Take 1 tablet (10 mg total) by mouth daily.  Patient not taking: Reported on 06/09/2022   Fenton Foy, NP  Active Self, Pharmacy Records  b complex vitamins tablet [id number]  Take 1 tablet by mouth daily. [provider]  Active Self, Pharmacy Records  blood glucose meter kit and supplies [id number]  Dispense based on patient and insurance preference. Use up to four times daily as directed. (FOR ICD-10 E10.9, E11.9). Regalado, Cassie Freer, MD  Active Self, Pharmacy Records  calcium carbonate (OS-CAL) 600 MG TABS [id number]  Take 600 mg by mouth 2 (two) times daily with a meal. [provider]  Active Self, Pharmacy Records  carvedilol (COREG) 25 MG tablet [id number]  Take 2 tablets (50 mg total) by mouth 2 (two) times daily with a meal. Lazaro Arms  S, NP  Active   cholecalciferol (VITAMIN D) 1000 UNITS tablet [id number]  Take 1,000 Units by mouth daily. [provider]  Active Self, Pharmacy Records  Continuous Blood Gluc Receiver (FREESTYLE LIBRE 2 READER) [place] [id number]  Use to check blood sugar continuously - sent to DME supplier [employer] [phone]) Fenton Foy, NP  Active Self, Pharmacy Records  Continuous Blood Gluc Sensor (FREESTYLE LIBRE 2 SENSOR) [place] [id number]  Use to  check blood sugar continuously - sent to DME supplier [employer] [phone]) Fenton Foy, NP  Active Self, Pharmacy Records  doxazosin (CARDURA) 4 MG tablet [id number]  Take 4 mg by mouth at bedtime.  [provider]  Active Self, Pharmacy Records  fluconazole (DIFLUCAN) 100 MG tablet [id number]  Take 200 mg by mouth today. Then 100 mg by mouth daily for 5 days.  Patient not taking: Reported on 06/08/2022   Ladell Pier, MD  Active Self, Pharmacy Records  furosemide (LASIX) 20 MG tablet [id number]  Take 20 mg by mouth daily.  [provider]  Active Self, Pharmacy Records  glipiZIDE (GLUCOTROL) 5 MG tablet [id number] Yes Take 1 tablet (5 mg total) by mouth 2 (two) times daily with a meal. Fenton Foy, NP Taking Active   insulin glargine (LANTUS) 100 UNIT/ML Solostar Pen [id number] Yes Inject 20 Units into the skin daily. Fenton Foy, NP Taking Active   Insulin Pen Needle (PEN NEEDLES 3/16") 31G X 5 MM MISC [id number]  1 Application by Does not apply route daily. Elmarie Shiley, MD  Active Self, Pharmacy Records  lidocaine-prilocaine (EMLA) cream [id number]  Apply 1 Application topically as needed. Ladell Pier, MD  Active Self, Pharmacy Records  lipase/protease/amylase (CREON) 36000 UNITS CPEP capsule [id number]  Take 1 capsule (36,000 Units total) by mouth 3 (three) times daily before meals. Owens Shark, NP  Active Pharmacy Records  lisinopril (ZESTRIL) 20 MG tablet [id number]  Take 20 mg by mouth daily. [provider]  Active Self, Pharmacy Records  Magnesium 500 MG TABS [id number]  Take 500 mg by mouth daily. [provider]  Active Self, Pharmacy Records  ondansetron (ZOFRAN) 4 MG tablet [id number]  Take 1 tablet (4 mg total) by mouth every 6 (six) hours as needed for nausea.  Patient not taking: Reported on 06/09/2022   Elmarie Shiley, MD  Active Self, Pharmacy Records  potassium chloride SA (KLOR-CON M) 20 MEQ tablet  [id number]  Take 1 tablet (20 mEq total) by mouth daily. Owens Shark, NP  Active Pharmacy Records  prochlorperazine (COMPAZINE) 10 MG tablet [id number]  Take 1 tablet (10 mg total) by mouth every 6 (six) hours as needed for nausea or vomiting.  Patient not taking: Reported on 06/09/2022   Ladell Pier, MD  Active Self, Pharmacy Records  traMADol Veatrice Bourbon) 50 MG tablet [id number]  Take 1 tablet (50 mg total) by mouth every 6 (six) hours as needed for moderate pain. Owens Shark, NP  Active               Assessment/Plan:   Diabetes: - Currently uncontrolled - Given elevated post prandial readings, recommend increasing glipizide from 5 mg twice daily to 7.5 mg twice daily. Discussed with PCP, she is in agreement. Order updated. Patient verbalizes udnersatnding.  - Will follow up Friday to determine if further tweaks needed regarding regimen prior to chemo next week   Follow Up Plan: phone call in 4 days  Catie  Hedwig Morton, PharmD, [facility] [phone]

## 2022-06-28 NOTE — Patient Instructions (Signed)
[place],   Increase glipizide to 7.5 mg (1 and 1/2 tablets) about 15-20 minutes before breakfast and before supper. Continue Lantus 20 units daily.   Keep scanning your blood sugar readings at least every 8 hours to make sure we are capturing data.   Please call me with any questions or concerns!  Catie Hedwig Morton, PharmD, [facility] [phone]

## 2022-06-29 ENCOUNTER — Other Ambulatory Visit: Payer: Self-pay | Admitting: *Deleted

## 2022-06-29 DIAGNOSIS — C250 Malignant neoplasm of head of pancreas: Secondary | ICD-10-CM

## 2022-06-29 NOTE — Progress Notes (Signed)
Lab orders placed.  

## 2022-07-02 ENCOUNTER — Other Ambulatory Visit: Payer: Medicare PPO | Admitting: Pharmacist

## 2022-07-02 DIAGNOSIS — E119 Type 2 diabetes mellitus without complications: Secondary | ICD-10-CM

## 2022-07-02 NOTE — Progress Notes (Unsigned)
Chief Complaint  Patient presents with   Diabetes   Medication Management    Sarah Davies is a 81 y.o. year old female who presented for a telephone visit.   They were referred to the pharmacist by their PCP for assistance in managing diabetes.   Subjective:  Care Team: Primary Care Provider: Fenton Foy, NP ; Next Scheduled Visit: 07/15/22  Medication Access/Adherence  Current Pharmacy:  [place] [address] Phone: [phone] Fax: [phone]   Patient reports affordability concerns with their medications: No  Patient reports access/transportation concerns to their pharmacy: No  Patient reports adherence concerns with their medications:  No     Diabetes:  Current medications: Lantus 20 units daily, glipizide 7.5 mg twice daily  Current glucose readings: fastings generally 140-150s per patient report, occasionally down to 130s. Post prandials remain >250s   Using Libre 2 CGM  Patient denies hypoglycemic s/sx including dizziness, shakiness, sweating. Patient denies hyperglycemic symptoms including polyuria, polydipsia, polyphagia, nocturia, neuropathy, blurred vision.  Continues to have concerns about low hemoglobin and her fatigue. I encouraged her to discuss with Dr. Benay Spice and his team on Tuesday   Health Maintenance  Health Maintenance Due  Topic Date Due   OPHTHALMOLOGY EXAM  Never done   Diabetic kidney evaluation - Urine ACR  Never done   TETANUS/TDAP  Never done   Zoster Vaccines- Shingrix (1 of 2) Never done   DEXA SCAN  Never done   Pneumonia Vaccine 56+ Years old (2 - PPSV23 or PCV20) 07/27/2017   COVID-19 Vaccine (3 - Pfizer risk series) 01/30/2020     Objective: Lab Results  Component Value Date   HGBA1C 13.0 (H) 03/22/2022    Lab Results  Component Value Date   CREATININE 0.69 06/21/2022   BUN 12 06/21/2022   NA 135 06/21/2022   K 4.0 06/21/2022    CL 99 06/21/2022   CO2 27 06/21/2022    No results found for: "CHOL", "HDL", "LDLCALC", "LDLDIRECT", "TRIG", "CHOLHDL"  Medications Reviewed Today     Reviewed by Osker Mason, RPH-CPP (Pharmacist) on 06/24/22 at 1017  Med List Status: <None>   Medication Order Taking? Sig Documenting Provider Last Dose Status Informant  ACCU-CHEK GUIDE test strip [id number]  PLEASE SEE ATTACHED FOR DETAILED DIRECTIONS [provider]  Active Self, Pharmacy Records  Accu-Chek Softclix Lancets lancets [id number]  4 (four) times daily. [provider]  Active Self, Pharmacy Records  acetaminophen (TYLENOL) 500 MG tablet [id number]  Take 1,000 mg by mouth every 6 (six) hours as needed for moderate pain. [provider]  Active Self, Pharmacy Records  amLODipine (NORVASC) 10 MG tablet [id number]  Take 1 tablet (10 mg total) by mouth daily.  Patient not taking: Reported on 06/09/2022   Fenton Foy, NP  Active Self, Pharmacy Records  b complex vitamins tablet [id number]  Take 1 tablet by mouth daily. [provider]  Active Self, Pharmacy Records  blood glucose meter kit and supplies [id number]  Dispense based on patient and insurance preference. Use up to four times daily as directed. (FOR ICD-10 E10.9, E11.9). Regalado, Cassie Freer, MD  Active Self, Pharmacy Records  calcium carbonate (OS-CAL) 600 MG TABS [id number]  Take 600 mg by mouth 2 (two) times daily with a meal. [provider]  Active Self, Pharmacy Records  carvedilol (COREG) 25 MG tablet [id number]  Take 2 tablets (50 mg total) by mouth 2 (two) times daily  with a meal. Fenton Foy, NP  Active   cholecalciferol (VITAMIN D) 1000 UNITS tablet [id number]  Take 1,000 Units by mouth daily. [provider]  Active Self, Pharmacy Records  Continuous Blood Gluc Receiver (FREESTYLE LIBRE 2 READER) [place] [id number]  Use to check blood sugar continuously - sent to DME supplier [employer] [phone] Fenton Foy, NP  Active Self, Pharmacy Records  Continuous Blood Gluc Sensor (FREESTYLE LIBRE 2 SENSOR) [place] [id number]  Use to check blood sugar continuously - sent to DME supplier [employer] [phone]) Fenton Foy, NP  Active Self, Pharmacy Records  doxazosin (CARDURA) 4 MG tablet [id number]  Take 4 mg by mouth at bedtime.  [provider]  Active Self, Pharmacy Records  fluconazole (DIFLUCAN) 100 MG tablet [id number]  Take 200 mg by mouth today. Then 100 mg by mouth daily for 5 days.  Patient not taking: Reported on 06/08/2022   Ladell Pier, MD  Active Self, Pharmacy Records  furosemide (LASIX) 20 MG tablet [id number]  Take 20 mg by mouth daily.  [provider]  Active Self, Pharmacy Records  glipiZIDE (GLUCOTROL) 5 MG tablet [id number] Yes Take 1 tablet (5 mg total) by mouth 2 (two) times daily with a meal. Fenton Foy, NP Taking Active   insulin glargine (LANTUS) 100 UNIT/ML Solostar Pen [id number] Yes Inject 20 Units into the skin daily. Fenton Foy, NP Taking Active   Insulin Pen Needle (PEN NEEDLES 3/16") 31G X 5 MM MISC [id number]  1 Application by Does not apply route daily. Elmarie Shiley, MD  Active Self, Pharmacy Records  lidocaine-prilocaine (EMLA) cream [id number]  Apply 1 Application topically as needed. Ladell Pier, MD  Active Self, Pharmacy Records  lipase/protease/amylase (CREON) 36000 UNITS CPEP capsule [id number]  Take 1 capsule (36,000 Units total) by mouth 3 (three) times daily before meals. Owens Shark, NP  Active Pharmacy Records  lisinopril (ZESTRIL) 20 MG tablet [id number]  Take 20 mg by mouth daily. [provider]  Active Self, Pharmacy Records  Magnesium 500 MG TABS [id number]  Take 500 mg by mouth daily. [provider]  Active Self, Pharmacy Records  ondansetron (ZOFRAN) 4 MG tablet [id number]  Take 1 tablet (4 mg total) by mouth every 6 (six) hours as needed for nausea.  Patient not  taking: Reported on 06/09/2022   Elmarie Shiley, MD  Active Self, Pharmacy Records  potassium chloride SA (KLOR-CON M) 20 MEQ tablet [id number]  Take 1 tablet (20 mEq total) by mouth daily. Owens Shark, NP  Active Pharmacy Records  prochlorperazine (COMPAZINE) 10 MG tablet [id number]  Take 1 tablet (10 mg total) by mouth every 6 (six) hours as needed for nausea or vomiting.  Patient not taking: Reported on 06/09/2022   Ladell Pier, MD  Active Self, Pharmacy Records  traMADol Veatrice Bourbon) 50 MG tablet [id number]  Take 1 tablet (50 mg total) by mouth every 6 (six) hours as needed for moderate pain. Owens Shark, NP  Active               Assessment/Plan:   Diabetes: - Currently uncontrolled - Recommend to increase glipizide to max effective dose of 10 mg twice daily. Discussed with PCP.  - Reviewed importance of taking glipizide ~15-20 minutes before a meal. If this dose increase does not result in post prandial control (more relaxed control goal <200), may need to consider discontinuation of glipizide and use of prandial insulin.  -  Recommend to continue Lantus at 20 units daily.  - Recommend to check glucose continuously using CGM   Follow Up Plan: follow up next week  Catie Hedwig Morton, PharmD, [place] [phone]

## 2022-07-04 ENCOUNTER — Other Ambulatory Visit: Payer: Self-pay | Admitting: Oncology

## 2022-07-05 ENCOUNTER — Other Ambulatory Visit (HOSPITAL_BASED_OUTPATIENT_CLINIC_OR_DEPARTMENT_OTHER): Payer: Self-pay

## 2022-07-05 MED ORDER — GLIPIZIDE 10 MG PO TABS
10.0000 mg | ORAL_TABLET | Freq: Two times a day (BID) | ORAL | 1 refills | Status: DC
  Filled 2022-07-05 – 2022-07-23 (×2): qty 180, 90d supply, fill #0
  Filled 2022-09-07: qty 180, 90d supply, fill #1

## 2022-07-06 ENCOUNTER — Other Ambulatory Visit: Payer: Self-pay | Admitting: *Deleted

## 2022-07-06 ENCOUNTER — Other Ambulatory Visit (HOSPITAL_BASED_OUTPATIENT_CLINIC_OR_DEPARTMENT_OTHER): Payer: Self-pay

## 2022-07-06 ENCOUNTER — Inpatient Hospital Stay: Payer: Medicare PPO

## 2022-07-06 ENCOUNTER — Other Ambulatory Visit: Payer: Medicare PPO | Admitting: Pharmacist

## 2022-07-06 ENCOUNTER — Inpatient Hospital Stay: Payer: Medicare PPO | Admitting: Oncology

## 2022-07-06 VITALS — BP 165/75 | HR 64

## 2022-07-06 VITALS — BP 145/72 | HR 72 | Temp 98.2°F | Resp 18 | Ht 66.0 in | Wt 216.0 lb

## 2022-07-06 DIAGNOSIS — Z5111 Encounter for antineoplastic chemotherapy: Secondary | ICD-10-CM | POA: Diagnosis not present

## 2022-07-06 DIAGNOSIS — C250 Malignant neoplasm of head of pancreas: Secondary | ICD-10-CM

## 2022-07-06 LAB — CBC WITH DIFFERENTIAL (CANCER CENTER ONLY)
Abs Immature Granulocytes: 0.01 10*3/uL (ref 0.00–0.07)
Basophils Absolute: 0.1 10*3/uL (ref 0.0–0.1)
Basophils Relative: 1 %
Eosinophils Absolute: 0.2 10*3/uL (ref 0.0–0.5)
Eosinophils Relative: 3 %
HCT: 32.7 % — ABNORMAL LOW (ref 36.0–46.0)
Hemoglobin: 10.4 g/dL — ABNORMAL LOW (ref 12.0–15.0)
Immature Granulocytes: 0 %
Lymphocytes Relative: 31 %
Lymphs Abs: 1.6 10*3/uL (ref 0.7–4.0)
MCH: 27.7 pg (ref 26.0–34.0)
MCHC: 31.8 g/dL (ref 30.0–36.0)
MCV: 87 fL (ref 80.0–100.0)
Monocytes Absolute: 0.8 10*3/uL (ref 0.1–1.0)
Monocytes Relative: 16 %
Neutro Abs: 2.6 10*3/uL (ref 1.7–7.7)
Neutrophils Relative %: 49 %
Platelet Count: 263 10*3/uL (ref 150–400)
RBC: 3.76 MIL/uL — ABNORMAL LOW (ref 3.87–5.11)
RDW: 15.1 % (ref 11.5–15.5)
WBC Count: 5.2 10*3/uL (ref 4.0–10.5)
nRBC: 0 % (ref 0.0–0.2)

## 2022-07-06 LAB — CMP (CANCER CENTER ONLY)
ALT: 33 U/L (ref 0–44)
AST: 28 U/L (ref 15–41)
Albumin: 3.7 g/dL (ref 3.5–5.0)
Alkaline Phosphatase: 72 U/L (ref 38–126)
Anion gap: 8 (ref 5–15)
BUN: 16 mg/dL (ref 8–23)
CO2: 29 mmol/L (ref 22–32)
Calcium: 9.5 mg/dL (ref 8.9–10.3)
Chloride: 100 mmol/L (ref 98–111)
Creatinine: 0.69 mg/dL (ref 0.44–1.00)
GFR, Estimated: >60 mL/min (ref >60)
Glucose, Bld: 193 mg/dL — ABNORMAL HIGH (ref 70–99)
Potassium: 4.1 mmol/L (ref 3.5–5.1)
Sodium: 137 mmol/L (ref 135–145)
Total Bilirubin: 0.6 mg/dL (ref 0.3–1.2)
Total Protein: 7.3 g/dL (ref 6.5–8.1)

## 2022-07-06 MED ORDER — PACLITAXEL PROTEIN-BOUND CHEMO INJECTION 100 MG
80.0000 mg/m2 | Freq: Once | INTRAVENOUS | Status: AC
  Administered 2022-07-06: 175 mg via INTRAVENOUS
  Filled 2022-07-06: qty 35

## 2022-07-06 MED ORDER — SODIUM CHLORIDE 0.9 % IV SOLN: Freq: Once | INTRAVENOUS | Status: AC

## 2022-07-06 MED ORDER — HEPARIN SOD (PORK) LOCK FLUSH 100 UNIT/ML IV SOLN
500.0000 [IU] | Freq: Once | INTRAVENOUS | Status: AC | PRN
  Administered 2022-07-06: 500 [IU]

## 2022-07-06 MED ORDER — FLUCONAZOLE 100 MG PO TABS
100.0000 mg | ORAL_TABLET | Freq: Every day | ORAL | 0 refills | Status: DC
  Filled 2022-07-06: qty 4, 4d supply, fill #0

## 2022-07-06 MED ORDER — SODIUM CHLORIDE 0.9 % IV SOLN
800.0000 mg/m2 | Freq: Once | INTRAVENOUS | Status: AC
  Administered 2022-07-06: 1748 mg via INTRAVENOUS
  Filled 2022-07-06: qty 26.3

## 2022-07-06 MED ORDER — PROCHLORPERAZINE MALEATE 10 MG PO TABS
10.0000 mg | ORAL_TABLET | Freq: Once | ORAL | Status: AC
  Administered 2022-07-06: 10 mg via ORAL
  Filled 2022-07-06: qty 1

## 2022-07-06 MED ORDER — SODIUM CHLORIDE 0.9% FLUSH
10.0000 mL | INTRAVENOUS | Status: DC | PRN
  Administered 2022-07-06: 10 mL

## 2022-07-06 MED ORDER — POTASSIUM CHLORIDE CRYS ER 20 MEQ PO TBCR
20.0000 meq | EXTENDED_RELEASE_TABLET | Freq: Every day | ORAL | 1 refills | Status: DC
  Filled 2022-07-06: qty 30, 30d supply, fill #0
  Filled 2022-08-09: qty 30, 30d supply, fill #1

## 2022-07-06 NOTE — Progress Notes (Signed)
Patient seen by Dr. Sherrill today ? ?Vitals are within treatment parameters. ? ?Labs reviewed by Dr. Sherrill and are within treatment parameters. ? ?Per physician team, patient is ready for treatment and there are NO modifications to the treatment plan.  ?

## 2022-07-06 NOTE — Patient Instructions (Signed)
[facility]   Discharge Instructions: Thank you for choosing [facility] to provide your oncology and hematology care.   If you have a lab appointment with the [facility], please go directly to the [facility] and check in at the registration area.   Wear comfortable clothing and clothing appropriate for easy access to any Portacath or PICC line.   We strive to give you quality time with your provider. You may need to reschedule your appointment if you arrive late (15 or more minutes).  Arriving late affects you and other patients whose appointments are after yours.  Also, if you miss three or more appointments without notifying the office, you may be dismissed from the clinic at the provider's discretion.      For prescription refill requests, have your pharmacy contact our office and allow 72 hours for refills to be completed.    Today you received the following chemotherapy and/or immunotherapy agents Paclitaxel-protein bound (ABRAXANE) & Gemcitabine (GEMZAR).      To help prevent nausea and vomiting after your treatment, we encourage you to take your nausea medication as directed.  BELOW ARE SYMPTOMS THAT SHOULD BE REPORTED IMMEDIATELY: *FEVER GREATER THAN 100.4 F (38 C) OR HIGHER *CHILLS OR SWEATING *NAUSEA AND VOMITING THAT IS NOT CONTROLLED WITH YOUR NAUSEA MEDICATION *UNUSUAL SHORTNESS OF BREATH *UNUSUAL BRUISING OR BLEEDING *URINARY PROBLEMS (pain or burning when urinating, or frequent urination) *BOWEL PROBLEMS (unusual diarrhea, constipation, pain near the anus) TENDERNESS IN MOUTH AND THROAT WITH OR WITHOUT PRESENCE OF ULCERS (sore throat, sores in mouth, or a toothache) UNUSUAL RASH, SWELLING OR PAIN  UNUSUAL VAGINAL DISCHARGE OR ITCHING   Items with * indicate a potential emergency and should be followed up as soon as possible or go to the Emergency Department if any problems should occur.  Please show the CHEMOTHERAPY ALERT  CARD or IMMUNOTHERAPY ALERT CARD at check-in to the Emergency Department and triage nurse.  Should you have questions after your visit or need to cancel or reschedule your appointment, please contact [facility]  Dept: [phone]  and follow the prompts.  Office hours are 8:00 a.m. to 4:30 p.m. Monday - Friday. Please note that voicemails left after 4:00 p.m. may not be returned until the following business day.  We are closed weekends and major holidays. You have access to a nurse at all times for urgent questions. Please call the main number to the clinic Dept: [phone] and follow the prompts.   For any non-urgent questions, you may also contact your provider using MyChart. We now offer e-Visits for anyone 60 and older to request care online for non-urgent symptoms. For details visit [email or web address].   Also download the MyChart app! Go to the app store, search "MyChart", open the app, select [facility], and log in with your MyChart username and password.  Masks are optional in the cancer centers. If you would like for your care team to wear a mask while they are taking care of you, please let them know. You may have one support person who is at least 80 years old accompany you for your appointments.  Paclitaxel Nanoparticle Albumin-Bound Injection What is this medication? NANOPARTICLE ALBUMIN-BOUND PACLITAXEL (Na no PAHR ti kuhl al BYOO muhn-bound PAK li TAX el) treats some types of cancer. It works by slowing down the growth of cancer cells. This medicine may be used for other purposes; ask your health care provider or pharmacist if you  have questions. COMMON BRAND NAME(S): Abraxane What should I tell my care team before I take this medication? They need to know if you have any of these conditions: Liver disease Low white blood cell levels An unusual or allergic reaction to paclitaxel, albumin, other medications, foods, dyes, or preservatives If you  or your partner are pregnant or trying to get pregnant Breast-feeding How should I use this medication? This medication is injected into a vein. It is given by your care team in a hospital or clinic setting. Talk to your care team about the use of this medication in children. Special care may be needed. Overdosage: If you think you have taken too much of this medicine contact a poison control center or emergency room at once. NOTE: This medicine is only for you. Do not share this medicine with others. What if I miss a dose? Keep appointments for follow-up doses. It is important not to miss your dose. Call your care team if you are unable to keep an appointment. What may interact with this medication? Other medications may affect the way this medication works. Talk with your care team about all of the medications you take. They may suggest changes to your treatment plan to lower the risk of side effects and to make sure your medications work as intended. This list may not describe all possible interactions. Give your health care provider a list of all the medicines, herbs, non-prescription drugs, or dietary supplements you use. Also tell them if you smoke, drink alcohol, or use illegal drugs. Some items may interact with your medicine. What should I watch for while using this medication? Your condition will be monitored carefully while you are receiving this medication. You may need blood work while taking this medication. This medication may make you feel generally unwell. This is not uncommon as chemotherapy can affect healthy cells as well as cancer cells. Report any side effects. Continue your course of treatment even though you feel ill unless your care team tells you to stop. This medication can cause serious allergic reactions. To reduce the risk, your care team may give you other medications to take before receiving this one. Be sure to follow the directions from your care team. This  medication may increase your risk of getting an infection. Call your care team for advice if you get a fever, chills, sore throat, or other symptoms of a cold or flu. Do not treat yourself. Try to avoid being around people who are sick. This medication may increase your risk to bruise or bleed. Call your care team if you notice any unusual bleeding. Be careful brushing or flossing your teeth or using a toothpick because you may get an infection or bleed more easily. If you have any dental work done, tell your dentist you are receiving this medication. Talk to your care team if you or your partner may be pregnant. Serious birth defects can occur if you take this medication during pregnancy and for 6 months after the last dose. You will need a negative pregnancy test before starting this medication. Contraception is recommended while taking this medication and for 6 months after the last dose. Your care team can help you find the option that works for you. If your partner can get pregnant, use a condom during sex while taking this medication and for 3 months after the last dose. Do not breastfeed while taking this medication and for 2 weeks after the last dose. This medication may cause infertility. Talk  to your care team if you are concerned about your fertility. What side effects may I notice from receiving this medication? Side effects that you should report to your care team as soon as possible: Allergic reactions--skin rash, itching, hives, swelling of the face, lips, tongue, or throat Dry cough, shortness of breath or trouble breathing Infection--fever, chills, cough, sore throat, wounds that don't heal, pain or trouble when passing urine, general feeling of discomfort or being unwell Low red blood cell level--unusual weakness or fatigue, dizziness, headache, trouble breathing Pain, tingling, or numbness in the hands or feet Stomach pain, unusual weakness or fatigue, nausea, vomiting, diarrhea, or  fever that lasts longer than expected Unusual bruising or bleeding Side effects that usually do not require medical attention (report to your care team if they continue or are bothersome): Diarrhea Fatigue Hair loss Loss of appetite Nausea Vomiting This list may not describe all possible side effects. Call your doctor for medical advice about side effects. You may report side effects to FDA at 1-800-FDA-1088. Where should I keep my medication? This medication is given in a hospital or clinic. It will not be stored at home. NOTE: This sheet is a summary. It may not cover all possible information. If you have questions about this medicine, talk to your doctor, pharmacist, or health care provider.  2023 Elsevier/Gold Standard (2022-01-27 00:00:00)  Gemcitabine Injection What is this medication? GEMCITABINE (jem SYE ta been) treats some types of cancer. It works by slowing down the growth of cancer cells. This medicine may be used for other purposes; ask your health care provider or pharmacist if you have questions. COMMON BRAND NAME(S): Gemzar, Infugem What should I tell my care team before I take this medication? They need to know if you have any of these conditions: Blood disorders Infection Kidney disease Liver disease Lung or breathing disease, such as asthma or COPD Recent or ongoing radiation therapy An unusual or allergic reaction to gemcitabine, other medications, foods, dyes, or preservatives If you or your partner are pregnant or trying to get pregnant Breast-feeding How should I use this medication? This medication is injected into a vein. It is given by your care team in a hospital or clinic setting. Talk to your care team about the use of this medication in children. Special care may be needed. Overdosage: If you think you have taken too much of this medicine contact a poison control center or emergency room at once. NOTE: This medicine is only for you. Do not share this  medicine with others. What if I miss a dose? Keep appointments for follow-up doses. It is important not to miss your dose. Call your care team if you are unable to keep an appointment. What may interact with this medication? Interactions have not been studied. This list may not describe all possible interactions. Give your health care provider a list of all the medicines, herbs, non-prescription drugs, or dietary supplements you use. Also tell them if you smoke, drink alcohol, or use illegal drugs. Some items may interact with your medicine. What should I watch for while using this medication? Your condition will be monitored carefully while you are receiving this medication. This medication may make you feel generally unwell. This is not uncommon, as chemotherapy can affect healthy cells as well as cancer cells. Report any side effects. Continue your course of treatment even though you feel ill unless your care team tells you to stop. In some cases, you may be given additional medications to help  with side effects. Follow all directions for their use. This medication may increase your risk of getting an infection. Call your care team for advice if you get a fever, chills, sore throat, or other symptoms of a cold or flu. Do not treat yourself. Try to avoid being around people who are sick. This medication may increase your risk to bruise or bleed. Call your care team if you notice any unusual bleeding. Be careful brushing or flossing your teeth or using a toothpick because you may get an infection or bleed more easily. If you have any dental work done, tell your dentist you are receiving this medication. Avoid taking medications that contain aspirin, acetaminophen, ibuprofen, naproxen, or ketoprofen unless instructed by your care team. These medications may hide a fever. Talk to your care team if you or your partner wish to become pregnant or think you might be pregnant. This medication can cause  serious birth defects if taken during pregnancy and for 6 months after the last dose. A negative pregnancy test is required before starting this medication. A reliable form of contraception is recommended while taking this medication and for 6 months after the last dose. Talk to your care team about effective forms of contraception. Do not father a child while taking this medication and for 3 months after the last dose. Use a condom while having sex during this time period. Do not breastfeed while taking this medication and for at least 1 week after the last dose. This medication may cause infertility. Talk to your care team if you are concerned about your fertility. What side effects may I notice from receiving this medication? Side effects that you should report to your care team as soon as possible: Allergic reactions--skin rash, itching, hives, swelling of the face, lips, tongue, or throat Capillary leak syndrome--stomach or muscle pain, unusual weakness or fatigue, feeling faint or lightheaded, decrease in the amount of urine, swelling of the ankles, hands, or feet, trouble breathing Infection--fever, chills, cough, sore throat, wounds that don't heal, pain or trouble when passing urine, general feeling of discomfort or being unwell Liver injury--right upper belly pain, loss of appetite, nausea, light-colored stool, dark yellow or brown urine, yellowing skin or eyes, unusual weakness or fatigue Low red blood cell level--unusual weakness or fatigue, dizziness, headache, trouble breathing Lung injury--shortness of breath or trouble breathing, cough, spitting up blood, chest pain, fever Stomach pain, bloody diarrhea, pale skin, unusual weakness or fatigue, decrease in the amount of urine, which may be signs of hemolytic uremic syndrome Sudden and severe headache, confusion, change in vision, seizures, which may be signs of posterior reversible encephalopathy syndrome (PRES) Unusual bruising or  bleeding Side effects that usually do not require medical attention (report to your care team if they continue or are bothersome): Diarrhea Drowsiness Hair loss Nausea Pain, redness, or swelling with sores inside the mouth or throat Vomiting This list may not describe all possible side effects. Call your doctor for medical advice about side effects. You may report side effects to FDA at 1-800-FDA-1088. Where should I keep my medication? This medication is given in a hospital or clinic. It will not be stored at home. NOTE: This sheet is a summary. It may not cover all possible information. If you have questions about this medicine, talk to your doctor, pharmacist, or health care provider.  2023 Elsevier/Gold Standard (2022-01-27 00:00:00)

## 2022-07-06 NOTE — Addendum Note (Signed)
Addended by: Tania Ade on: 07/06/2022 11:37 AM   Modules accepted: Orders

## 2022-07-06 NOTE — Progress Notes (Signed)
[place] OFFICE PROGRESS NOTE   Diagnosis: Pancreas cancer  INTERVAL HISTORY:   Sarah Davies returns as scheduled.  She complete another cycle of gemcitabine/Abraxane on 06/21/2022.  She had a fever on the evening following chemotherapy.  She also had sharp pains in the extremities following chemotherapy.  She has intermittent "tightness "in the abdomen.  She has malaise.  No rash or nausea/vomiting.  No change in baseline neuropathy symptoms.  Objective:  Vital signs in last 24 hours:  Blood pressure (!) 145/72, pulse 72, temperature 98.2 F (36.8 C), temperature source Oral, resp. rate 18, height '5\' 6"'$  (1.676 m), weight 216 lb (98 kg), SpO2 100 %.    HEENT: Thrush at the bilateral buccal mucosa and tongue Resp: Lungs clear bilaterally Cardio: Regular rate and rhythm GI: No hepatosplenomegaly, no mass, no apparent ascites Vascular: Trace lower leg edema bilaterally    Portacath/PICC-without erythema  Lab Results:  Lab Results  Component Value Date   WBC 5.2 07/06/2022   HGB 10.4 (L) 07/06/2022   HCT 32.7 (L) 07/06/2022   MCV 87.0 07/06/2022   PLT 263 07/06/2022   NEUTROABS 2.6 07/06/2022    CMP  Lab Results  Component Value Date   NA 137 07/06/2022   K 4.1 07/06/2022   CL 100 07/06/2022   CO2 29 07/06/2022   GLUCOSE 193 (H) 07/06/2022   BUN 16 07/06/2022   CREATININE 0.69 07/06/2022   CALCIUM 9.5 07/06/2022   PROT 7.3 07/06/2022   ALBUMIN 3.7 07/06/2022   AST 28 07/06/2022   ALT 33 07/06/2022   ALKPHOS 72 07/06/2022   BILITOT 0.6 07/06/2022   GFRNONAA >60 07/06/2022    Lab Results  Component Value Date   CAN199 156 (H) 06/21/2022    Medications: I have reviewed the patient's current medications.   Assessment/Plan: Pancreas cancer 03/22/2022 CT abdomen/pelvis-ill-defined 3.6 x 2.5 cm hypodense mass in the uncinate process of the pancreas; possible associated acute pancreatitis; fatty liver; faint hypodense focus in the inferior right  lobe of the liver too small to characterize; no intrahepatic biliary dilatation.   03/23/2022 MRI-hypoenhancing mass in the uncinate process/inferior head of the pancreas abutting the SMV with distortion of the SMV, less than 180 degree involvement by MR but with limited assessment; pancreatic duct narrowing/obstruction and early involvement of the distal common bile duct; subtle hepatic lesion in the right hepatic lobe; hazy mesentery may reflect concomitant mesenteritis or lymphatic congestion; small lymph nodes adjacent to the masslike area in the head of the pancreas. 03/26/2022 upper EUS-mass identified in the pancreatic head and uncinate process, appearance consistent with adenocarcinoma.  FNA pancreas head showed malignant cells consistent with adenocarcinoma. 04/07/2022 CT abdomen/pelvis pancreatic protocol-adenocarcinoma within the pancreatic uncinate process, isolated right hepatic lobe lesion suspicious for metastatic disease, contact of the SMV by tumor over an approximately 180 degree span with no evidence of occlusion and no arterial involvement, nonspecific borderline enlarged portacaval lymph node, small bowel mesenteric nodes most likely related to mesenteric adenitis/panniculitis Cycle 1 gemcitabine/Abraxane 04/27/2022 Cycle 2 gemcitabine/Abraxane 05/11/2022 Cycle 3 gemcitabine/Abraxane 05/25/2022 Cycle 4 gemcitabine/Abraxane 06/08/2022 Cycle 5 gemcitabine/Abraxane 06/21/2022, gemcitabine and Abraxane dose reduced due to thrombocytopenia Cycle 6 gemcitabine/Abraxane 07/05/2022 Diabetes with recent poor control Diarrhea, typically after eating Diabetic neuropathy Hypertension Hyperlipidemia Leg edema-04/06/2022 bilateral lower extremities negative for DVT Severe oropharyngeal candidiasis 05/03/2022-Diflucan 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  Fever 07/09/2022 after  chemotherapy-evaluated in  emergency department, cultures and chest x-ray negative; fever potentially related to Gemcitabine       Disposition: Sarah Davies appears stable.  She has completed 5 cycles of gemcitabine/Abraxane.  She has tolerated the treatment well.  The CA 19-9 was lower again when she was here on 06/21/2022.  The fever she experienced immediately following chemotherapy is likely related to gemcitabine.  She will complete another cycle of gemcitabine/Abraxane today.  Ms. Sarah Davies will be referred for a restaging CT after this cycle.  She will return for an office visit in 2 weeks.  Betsy Coder, MD  07/06/2022  11:11 AM

## 2022-07-07 ENCOUNTER — Telehealth: Payer: Self-pay

## 2022-07-07 ENCOUNTER — Other Ambulatory Visit: Payer: Medicare PPO | Admitting: Pharmacist

## 2022-07-07 NOTE — Telephone Encounter (Signed)
Called and spoke to pt giving the following instructions for her upcoming scan: "Upcoming CT appt is pn 07/13/22 at 4:30pm at [facility]. Flush appt at 3:30pm. NPO 4 hours prior to scan. Drink 32oz water 20 minutes before scan." Pt verbalizes understanding and agrees with plan of care. Pt will call [facility] with any questions/concerns.

## 2022-07-07 NOTE — Progress Notes (Signed)
Care Coordination Call  Contacted patient. She reports glucose readings have remained fairly well controlled over the past several days - fastings remaining ~140-160s, post prandial readings ~180s.   Recommend to continue current regimen at this time. Follow up in 2 weeks  Catie TJodi Mourning, PharmD, [facility] [phone]

## 2022-07-08 LAB — CANCER ANTIGEN 19-9: CA 19-9: 221 U/mL — ABNORMAL HIGH (ref 0–35)

## 2022-07-12 ENCOUNTER — Telehealth: Payer: Self-pay

## 2022-07-12 ENCOUNTER — Other Ambulatory Visit: Payer: Self-pay | Admitting: *Deleted

## 2022-07-12 DIAGNOSIS — C250 Malignant neoplasm of head of pancreas: Secondary | ICD-10-CM

## 2022-07-12 NOTE — Telephone Encounter (Signed)
Mrs. Ocanas called in and stated she was confuse about her instruction for her Ct scan. She states someone call from lab and instruct her to drink her two bottle of contrast. She was not sure why because she only need two bottle of water before the scan.  I advise the patient she only need the two bottle of water before the scan. Patient gave verbal understanding and had no further questions or concerns.

## 2022-07-12 NOTE — Progress Notes (Addendum)
Re-entered CT orders for tomorrow with specific pancreas protocol order at request of radiology. Notified managed care to confirm PA is still valid for new order. Per managed care: new PA is not needed.

## 2022-07-13 ENCOUNTER — Inpatient Hospital Stay: Payer: Medicare PPO | Attending: Nurse Practitioner

## 2022-07-13 ENCOUNTER — Encounter (HOSPITAL_BASED_OUTPATIENT_CLINIC_OR_DEPARTMENT_OTHER): Payer: Self-pay

## 2022-07-13 ENCOUNTER — Ambulatory Visit (HOSPITAL_COMMUNITY)
Admission: RE | Admit: 2022-07-13 | Discharge: 2022-07-13 | Disposition: A | Discharge status: Home / Self Care | Payer: Medicare PPO | Source: Ambulatory Visit | Attending: Oncology | Admitting: Oncology

## 2022-07-13 DIAGNOSIS — Z5111 Encounter for antineoplastic chemotherapy: Secondary | ICD-10-CM | POA: Insufficient documentation

## 2022-07-13 DIAGNOSIS — E1140 Type 2 diabetes mellitus with diabetic neuropathy, unspecified: Secondary | ICD-10-CM | POA: Insufficient documentation

## 2022-07-13 DIAGNOSIS — E785 Hyperlipidemia, unspecified: Secondary | ICD-10-CM | POA: Insufficient documentation

## 2022-07-13 DIAGNOSIS — K831 Obstruction of bile duct: Secondary | ICD-10-CM | POA: Insufficient documentation

## 2022-07-13 DIAGNOSIS — C250 Malignant neoplasm of head of pancreas: Secondary | ICD-10-CM | POA: Diagnosis present

## 2022-07-13 DIAGNOSIS — I10 Essential (primary) hypertension: Secondary | ICD-10-CM | POA: Insufficient documentation

## 2022-07-13 DIAGNOSIS — R197 Diarrhea, unspecified: Secondary | ICD-10-CM | POA: Insufficient documentation

## 2022-07-13 DIAGNOSIS — E1165 Type 2 diabetes mellitus with hyperglycemia: Secondary | ICD-10-CM | POA: Insufficient documentation

## 2022-07-13 DIAGNOSIS — R059 Cough, unspecified: Secondary | ICD-10-CM | POA: Insufficient documentation

## 2022-07-13 DIAGNOSIS — Z23 Encounter for immunization: Secondary | ICD-10-CM | POA: Insufficient documentation

## 2022-07-13 DIAGNOSIS — R600 Localized edema: Secondary | ICD-10-CM | POA: Insufficient documentation

## 2022-07-13 MED ORDER — HEPARIN SOD (PORK) LOCK FLUSH 100 UNIT/ML IV SOLN
500.0000 [IU] | Freq: Once | INTRAVENOUS | Status: AC
  Administered 2022-07-13: 500 [IU] via INTRAVENOUS

## 2022-07-13 MED ORDER — IOHEXOL 300 MG/ML  SOLN
100.0000 mL | Freq: Once | INTRAMUSCULAR | Status: AC | PRN
  Administered 2022-07-13: 85 mL via INTRAVENOUS

## 2022-07-15 ENCOUNTER — Other Ambulatory Visit (HOSPITAL_BASED_OUTPATIENT_CLINIC_OR_DEPARTMENT_OTHER): Payer: Self-pay

## 2022-07-15 ENCOUNTER — Ambulatory Visit (INDEPENDENT_AMBULATORY_CARE_PROVIDER_SITE_OTHER): Payer: Medicare PPO | Admitting: Nurse Practitioner

## 2022-07-15 ENCOUNTER — Encounter: Payer: Self-pay | Admitting: Nurse Practitioner

## 2022-07-15 VITALS — BP 130/71 | HR 68 | Temp 97.2°F | Ht 66.0 in | Wt 213.6 lb

## 2022-07-15 DIAGNOSIS — E119 Type 2 diabetes mellitus without complications: Secondary | ICD-10-CM

## 2022-07-15 DIAGNOSIS — Z794 Long term (current) use of insulin: Secondary | ICD-10-CM

## 2022-07-15 LAB — POCT GLYCOSYLATED HEMOGLOBIN (HGB A1C)
HbA1c POC (<> result, manual entry): 10 % (ref 4.0–5.6)
HbA1c, POC (controlled diabetic range): 10 % — AB (ref 0.0–7.0)
HbA1c, POC (prediabetic range): 10 % — AB (ref 5.7–6.4)
Hemoglobin A1C: 10 % — AB (ref 4.0–5.6)

## 2022-07-15 MED ORDER — "PEN NEEDLES 3/16"" 31G X 5 MM MISC"
1.0000 | Freq: Every day | 0 refills | Status: DC
  Filled 2022-07-15: qty 100, 90d supply, fill #0

## 2022-07-15 NOTE — Progress Notes (Signed)
$'@Patient'q$  ID: Sarah Davies, female    DOB: 04-Apr-1941, 81 y.o.   MRN: [id number]  Chief Complaint  Patient presents with   Follow-up    Pt would like to know the results for [place]. Pt is requesting refill on needles for insulin     Referring provider: Fenton Foy, NP   HPI  81 year old female with history of hypertension, pancreatic cancer, diabetes, hyperlipidemia.   Patient has continues to follow with oncology. Is currently undergoing chemo treatments. Just completed CT scan today for cancer staging. She has followed with Catie Jodi Mourning (pharmacist) for diabetic medication management. She is currently on glipizide and Lantus. Patient blood sugars are still in the 100's to 200's. A1C has came down to 10.0 from 13.0. Denies f/c/s, n/v/d, hemoptysis, PND, leg swelling Denies chest pain or edema  Note: Family wants her to move back to [place]  Allergen Reactions   Nicotine Other (See Comments)    Throat closes    Other Other (See Comments)    Joint pain Animal Dander From when she was tested, it was stimulated.      Shellfish Allergy Other (See Comments)    Has never had a reaction, was just told through allergy testing that she is allergic to shellfish "Throat closes"   Molds & Smuts Other (See Comments)    From when she was tested, it was stimulated.      Statins Other (See Comments)    Muscle aches    Immunization History  Administered Date(s) Administered   Fluad Quad(high Dose 65+) 07/27/2016, 10/31/2017   Influenza, High Dose Seasonal PF 10/17/2014   Influenza-Unspecified 07/23/2021   PFIZER(Purple Top)SARS-COV-2 Vaccination 12/10/2019, 01/02/2020   Pneumococcal Conjugate-13 07/27/2016   Pneumococcal-Unspecified 05/11/2007    Past Medical History:  Diagnosis Date   Allergy    Cataract    Diabetes mellitus without complication ([place])    Hypertension    Pancreatic cancer ([place])     Tobacco History: Social History   Tobacco Use  Smoking  Status Never   Passive exposure: Never  Smokeless Tobacco Never   Counseling given: Not Answered   Outpatient Encounter Medications as of 07/15/2022  Medication Sig   ACCU-CHEK GUIDE test strip PLEASE SEE ATTACHED FOR DETAILED DIRECTIONS   Accu-Chek Softclix Lancets lancets 4 (four) times daily.   acetaminophen (TYLENOL) 500 MG tablet Take 1,000 mg by mouth every 6 (six) hours as needed for moderate pain.   b complex vitamins tablet Take 1 tablet by mouth daily.   blood glucose meter kit and supplies Dispense based on patient and insurance preference. Use up to four times daily as directed. (FOR ICD-10 E10.9, E11.9).   calcium carbonate (OS-CAL) 600 MG TABS Take 600 mg by mouth 2 (two) times daily with a meal.   carvedilol (COREG) 25 MG tablet Take 2 tablets (50 mg total) by mouth 2 (two) times daily with a meal.   cholecalciferol (VITAMIN D) 1000 UNITS tablet Take 1,000 Units by mouth daily.   Continuous Blood Gluc Receiver (FREESTYLE LIBRE 2 READER) DEVI Use to check blood sugar continuously - sent to DME supplier [employer] [phone])   Continuous Blood Gluc Sensor (FREESTYLE LIBRE 2 SENSOR) MISC Use to check blood sugar continuously - sent to DME supplier [employer] [phone])   doxazosin (CARDURA) 4 MG tablet Take 4 mg by mouth at bedtime.    furosemide (LASIX) 20 MG tablet Take 20 mg by mouth daily.  glipiZIDE (GLUCOTROL) 10 MG tablet Take 1 tablet (10 mg total) by mouth 2 (two) times daily with a meal.   insulin glargine (LANTUS) 100 UNIT/ML Solostar Pen Inject 20 Units into the skin daily.   lipase/protease/amylase (CREON) 36000 UNITS CPEP capsule Take 1 capsule (36,000 Units total) by mouth 3 (three) times daily before meals.   lisinopril (ZESTRIL) 20 MG tablet Take 20 mg by mouth daily.   Magnesium 500 MG TABS Take 500 mg by mouth daily.   potassium chloride SA (KLOR-CON M) 20 MEQ tablet Take 1 tablet (20 mEq total) by mouth daily.    traMADol (ULTRAM) 50 MG tablet Take 1 tablet (50 mg total) by mouth every 6 (six) hours as needed for moderate pain.   [DISCONTINUED] Insulin Pen Needle (PEN NEEDLES 3/16") 31G X 5 MM MISC 1 Application by Does not apply route daily.   Insulin Pen Needle (PEN NEEDLES 3/16") 31G X 5 MM MISC Use as directed once daily.   [DISCONTINUED] amLODipine (NORVASC) 10 MG tablet Take 1 tablet (10 mg total) by mouth daily. (Patient not taking: Reported on 06/09/2022)   [DISCONTINUED] fluconazole (DIFLUCAN) 100 MG tablet Take 1 tablet (100 mg total) by mouth daily. X 4 days (Patient not taking: Reported on 07/15/2022)   [DISCONTINUED] lidocaine-prilocaine (EMLA) cream Apply 1 Application topically as needed. (Patient not taking: Reported on 07/06/2022)   [DISCONTINUED] ondansetron (ZOFRAN) 4 MG tablet Take 1 tablet (4 mg total) by mouth every 6 (six) hours as needed for nausea. (Patient not taking: Reported on 06/09/2022)   [DISCONTINUED] prochlorperazine (COMPAZINE) 10 MG tablet Take 1 tablet (10 mg total) by mouth every 6 (six) hours as needed for nausea or vomiting. (Patient not taking: Reported on 06/09/2022)   Facility-Administered Encounter Medications as of 07/15/2022  Medication   prochlorperazine (COMPAZINE) 10 MG tablet     Review of Systems  Review of Systems  Constitutional: Negative.   HENT: Negative.    Cardiovascular: Negative.   Gastrointestinal: Negative.   Allergic/Immunologic: Negative.   Neurological: Negative.   Psychiatric/Behavioral: Negative.         Physical Exam  BP 130/71 (BP Location: Left Arm, Patient Position: Sitting, Cuff Size: Large)   Pulse 68   Temp (!) 97.2 F (36.2 C)   Ht $R'5\' 6"'Jq$  (1.676 m)   Wt 213 lb 9.6 oz (96.9 kg)   SpO2 100%   BMI 34.48 kg/m   Wt Readings from Last 5 Encounters:  07/15/22 213 lb 9.6 oz (96.9 kg)  07/06/22 216 lb (98 kg)  06/21/22 220 lb 12.8 oz (100.2 kg)  06/10/22 224 lb 8 oz (101.8 kg)  06/08/22 225 lb 9.6 oz (102.3 kg)      Physical Exam Vitals and nursing note reviewed.  Constitutional:      General: She is not in acute distress.    Appearance: She is well-developed.  Cardiovascular:     Rate and Rhythm: Normal rate and regular rhythm.  Pulmonary:     Effort: Pulmonary effort is normal.     Breath sounds: Normal breath sounds.  Neurological:     Mental Status: She is alert and oriented to person, place, and time.      Imaging: CT ABD PELVIS W/WO CM ONCOLOGY PANCREATIC PROTOCOL  Result Date: 07/15/2022 CLINICAL DATA:  Pancreatic cancer restaging * Tracking Code: BO * EXAM: CT ABDOMEN AND PELVIS WITHOUT AND WITH CONTRAST TECHNIQUE: Multidetector CT imaging of the abdomen and pelvis was performed following the standard protocol before and following the  bolus administration of intravenous contrast. RADIATION DOSE REDUCTION: This exam was performed according to the departmental dose-optimization program which includes automated exposure control, adjustment of the mA and/or kV according to patient size and/or use of iterative reconstruction technique. CONTRAST:  22mL OMNIPAQUE IOHEXOL 300 MG/ML  SOLN COMPARISON:  04/07/2022 FINDINGS: Lower chest: Unremarkable Hepatobiliary: Pneumobilia. Mild periportal edema. Gallbladder surgically absent. Distal CBD expandable stent noted. Beaded appearance of the common hepatic duct containing gas and fluid. New 4.5 by 0.9 by 2.7 cm hypodensity posteriorly in the right hepatic lobe on image 21 of series 6, not specifically corresponding to a vascular structure. Subtle hypodense 0.7 cm lesion posteriorly in the right hepatic lobe on image 21 series 6. Inferiorly in the right hepatic lobe a 0.8 cm hypodense lesion on image 31 series 6 previously measured 1.3 cm. New hypodensity along the dome of the left hepatic lobe measuring 1.2 by 1.1 cm on image 14 series 6. Pancreas: Dorsal pancreatic duct dilatation. At the site of the previous 3.6 cm pancreatic head mass, there is an  indistinct mass with central gas density in the pancreatic head measuring about 2.7 cm in diameter on image 98 series 9. Increased peripancreatic stranding especially along the pancreatic head. Cystic lesion along the pancreatic tail 1.2 by 1.4 cm on image 106 series 9, roughly stable. This could be a dilated side duct or a small intraductal papillary mucinous neoplasm. Spleen: Unremarkable Adrenals/Urinary Tract: Scarring of the left kidney upper pole. Tiny hypodense left renal lesions are technically too small to characterize although statistically likely to be small cysts and not requiring further follow. Adrenal glands appear normal. Stomach/Bowel: Unremarkable Vascular/Lymphatic: Mild abdominal aortic atherosclerosis with some atherosclerotic calcification at the origins of both renal arteries. No findings of portal vein thrombosis. Reproductive: Uterus absent.  Adnexa unremarkable. Other: Central mesenteric stranding. Reduced stranding around lymph nodes and vessels raising suspicion for sclerosing mesenteritis. Musculoskeletal: Lumbar spondylosis and degenerative disc disease causing multilevel impingement. Supraumbilical hernia contains adipose tissue and a margin of small bowel loop on image 119 series 14, without complicating feature. Hernia mesh markers are noted spanning along this region. IMPRESSION: 1. Reduced size of the pancreatic head mass, currently 2.7 cm in diameter, with central gas density in the pancreatic head mass. This could be from tumor necrosis. 2. There are several new hypodense lesions in the liver which could be inflammatory or neoplastic. One of the previously observed hypodense lesions has reduced in size, but there is a new 4.5 by 0.9 by 2.7 cm hypodense lesion posteriorly in the right hepatic lobe. 3. Increased peripancreatic stranding especially along the pancreatic head, correlate with pancreatic enzymes in assessing for pancreatitis. 4. 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 5.  Stable small cystic lesion along the pancreatic tail, possibly a dilated side duct or small intraductal papillary mucinous neoplasm. Surveillance in the context of the patient's follow up cancer imaging is recommended. 6. Lumbar spondylosis and degenerative disc disease causing multilevel impingement. 7. Supraumbilical hernia contains adipose tissue and a margin of small bowel loop, without complicating feature. 8. Scarring of the left kidney upper pole. 9. Aortic atherosclerosis. Aortic Atherosclerosis (ICD10-I70.0). Electronically Signed   By: Van Clines M.D.   On: 07/15/2022 11:59     Assessment & Plan:   Type 2 diabetes mellitus without complication (HCC) - Insulin Pen Needle (PEN NEEDLES 3/16") 31G X 5 MM MISC; 1 Application by Does not apply route daily.  Dispense: 30 each; Refill: 1 - POCT glycosylated hemoglobin (Hb A1C)   Follow  up:  Follow up in 3 months or sooner if needed     Fenton Foy, NP 07/15/2022

## 2022-07-15 NOTE — Patient Instructions (Addendum)
1. Type 2 diabetes mellitus without complication, with long-term current use of insulin   - Insulin Pen Needle (PEN NEEDLES 3/16") 31G X 5 MM MISC; 1 Application by Does not apply route daily.  Dispense: 30 each; Refill: 1 - POCT glycosylated hemoglobin (Hb A1C)   Follow up:  Follow up in 3 months or sooner if needed

## 2022-07-15 NOTE — Assessment & Plan Note (Signed)
-   Insulin Pen Needle (PEN NEEDLES 3/16") 31G X 5 MM MISC; 1 Application by Does not apply route daily.  Dispense: 30 each; Refill: 1 - POCT glycosylated hemoglobin (Hb A1C)   Follow up:  Follow up in 3 months or sooner if needed

## 2022-07-16 ENCOUNTER — Other Ambulatory Visit: Payer: Self-pay

## 2022-07-17 ENCOUNTER — Other Ambulatory Visit: Payer: Self-pay | Admitting: Oncology

## 2022-07-17 DIAGNOSIS — C250 Malignant neoplasm of head of pancreas: Secondary | ICD-10-CM

## 2022-07-20 ENCOUNTER — Inpatient Hospital Stay: Payer: Medicare PPO | Admitting: Nurse Practitioner

## 2022-07-20 ENCOUNTER — Inpatient Hospital Stay: Payer: Medicare PPO

## 2022-07-20 VITALS — BP 138/67 | HR 61 | Resp 18

## 2022-07-20 VITALS — BP 141/73 | HR 70 | Temp 98.1°F | Resp 18 | Ht 66.0 in | Wt 214.4 lb

## 2022-07-20 DIAGNOSIS — R059 Cough, unspecified: Secondary | ICD-10-CM | POA: Diagnosis not present

## 2022-07-20 DIAGNOSIS — C250 Malignant neoplasm of head of pancreas: Secondary | ICD-10-CM

## 2022-07-20 DIAGNOSIS — K831 Obstruction of bile duct: Secondary | ICD-10-CM | POA: Diagnosis not present

## 2022-07-20 DIAGNOSIS — E785 Hyperlipidemia, unspecified: Secondary | ICD-10-CM | POA: Diagnosis not present

## 2022-07-20 DIAGNOSIS — Z5111 Encounter for antineoplastic chemotherapy: Secondary | ICD-10-CM | POA: Diagnosis present

## 2022-07-20 DIAGNOSIS — Z23 Encounter for immunization: Secondary | ICD-10-CM | POA: Diagnosis not present

## 2022-07-20 DIAGNOSIS — E1140 Type 2 diabetes mellitus with diabetic neuropathy, unspecified: Secondary | ICD-10-CM | POA: Diagnosis not present

## 2022-07-20 DIAGNOSIS — E1165 Type 2 diabetes mellitus with hyperglycemia: Secondary | ICD-10-CM | POA: Diagnosis not present

## 2022-07-20 DIAGNOSIS — R600 Localized edema: Secondary | ICD-10-CM | POA: Diagnosis not present

## 2022-07-20 DIAGNOSIS — I10 Essential (primary) hypertension: Secondary | ICD-10-CM | POA: Diagnosis not present

## 2022-07-20 DIAGNOSIS — R197 Diarrhea, unspecified: Secondary | ICD-10-CM | POA: Diagnosis not present

## 2022-07-20 LAB — CMP (CANCER CENTER ONLY)
ALT: 32 U/L (ref 0–44)
AST: 39 U/L (ref 15–41)
Albumin: 3.7 g/dL (ref 3.5–5.0)
Alkaline Phosphatase: 68 U/L (ref 38–126)
Anion gap: 7 (ref 5–15)
BUN: 15 mg/dL (ref 8–23)
CO2: 28 mmol/L (ref 22–32)
Calcium: 9.6 mg/dL (ref 8.9–10.3)
Chloride: 104 mmol/L (ref 98–111)
Creatinine: 0.73 mg/dL (ref 0.44–1.00)
GFR, Estimated: >60 mL/min (ref >60)
Glucose, Bld: 142 mg/dL — ABNORMAL HIGH (ref 70–99)
Potassium: 4.1 mmol/L (ref 3.5–5.1)
Sodium: 139 mmol/L (ref 135–145)
Total Bilirubin: 0.5 mg/dL (ref 0.3–1.2)
Total Protein: 7.5 g/dL (ref 6.5–8.1)

## 2022-07-20 LAB — CBC WITH DIFFERENTIAL (CANCER CENTER ONLY)
Abs Immature Granulocytes: 0.02 10*3/uL (ref 0.00–0.07)
Basophils Absolute: 0 10*3/uL (ref 0.0–0.1)
Basophils Relative: 1 %
Eosinophils Absolute: 0.3 10*3/uL (ref 0.0–0.5)
Eosinophils Relative: 4 %
HCT: 33.8 % — ABNORMAL LOW (ref 36.0–46.0)
Hemoglobin: 10.7 g/dL — ABNORMAL LOW (ref 12.0–15.0)
Immature Granulocytes: 0 %
Lymphocytes Relative: 22 %
Lymphs Abs: 1.5 10*3/uL (ref 0.7–4.0)
MCH: 27.7 pg (ref 26.0–34.0)
MCHC: 31.7 g/dL (ref 30.0–36.0)
MCV: 87.6 fL (ref 80.0–100.0)
Monocytes Absolute: 0.9 10*3/uL (ref 0.1–1.0)
Monocytes Relative: 13 %
Neutro Abs: 4.1 10*3/uL (ref 1.7–7.7)
Neutrophils Relative %: 60 %
Platelet Count: 153 10*3/uL (ref 150–400)
RBC: 3.86 MIL/uL — ABNORMAL LOW (ref 3.87–5.11)
RDW: 16.1 % — ABNORMAL HIGH (ref 11.5–15.5)
WBC Count: 6.9 10*3/uL (ref 4.0–10.5)
nRBC: 0 % (ref 0.0–0.2)

## 2022-07-20 MED ORDER — PROCHLORPERAZINE MALEATE 10 MG PO TABS
10.0000 mg | ORAL_TABLET | Freq: Once | ORAL | Status: AC
  Administered 2022-07-20: 10 mg via ORAL
  Filled 2022-07-20: qty 1

## 2022-07-20 MED ORDER — HEPARIN SOD (PORK) LOCK FLUSH 100 UNIT/ML IV SOLN
500.0000 [IU] | Freq: Once | INTRAVENOUS | Status: AC | PRN
  Administered 2022-07-20: 500 [IU]

## 2022-07-20 MED ORDER — SODIUM CHLORIDE 0.9 % IV SOLN: Freq: Once | INTRAVENOUS | Status: AC

## 2022-07-20 MED ORDER — PACLITAXEL PROTEIN-BOUND CHEMO INJECTION 100 MG
80.0000 mg/m2 | Freq: Once | INTRAVENOUS | Status: AC
  Administered 2022-07-20: 175 mg via INTRAVENOUS
  Filled 2022-07-20: qty 35

## 2022-07-20 MED ORDER — SODIUM CHLORIDE 0.9% FLUSH
10.0000 mL | INTRAVENOUS | Status: DC | PRN
  Administered 2022-07-20: 10 mL

## 2022-07-20 MED ORDER — SODIUM CHLORIDE 0.9 % IV SOLN
800.0000 mg/m2 | Freq: Once | INTRAVENOUS | Status: AC
  Administered 2022-07-20: 1748 mg via INTRAVENOUS
  Filled 2022-07-20: qty 21.04

## 2022-07-20 NOTE — Progress Notes (Signed)
Formatting of this note might be different from the original.  Patient seen by Lonna CobbLisa Thomas NP today    Vitals are within treatment parameters.    Labs reviewed by Lonna CobbLisa Thomas NP and are within treatment parameters.    Per physician team, patient is ready for treatment and there are NO modifications to the treatment plan.   Electronically signed by Dimitri PedMcLaughlin, Constance S, LPN at 16/10/960410/07/2022  5:02 PM EDT

## 2022-07-20 NOTE — Progress Notes (Signed)
Formatting of this note is different from the original.  Images from the original note were not included.    [facility]  OFFICE PROGRESS NOTE    Diagnosis: Pancreas cancer    INTERVAL HISTORY:     Lynn Blackwell returns as scheduled.  She completed cycle 6 gemcitabine/Abraxane 07/05/2022.  She denies nausea/vomiting.  No mouth sores.  No diarrhea.  No rash or fever after treatment.  Intermittent cough.  Stable numbness/tingling in the fingertips, predated chemotherapy.    Objective:    Vital signs in last 24 hours:    Blood pressure (!) 141/73, pulse 70, temperature 98.1 F (36.7 C), temperature source Oral, resp. rate 18, height 5' 6 (1.676 m), weight 214 lb 6.4 oz (97.3 kg), SpO2 99 %.      HEENT: No thrush or ulcers.  Resp: Lungs clear bilaterally.  Cardio: Regular rate and rhythm.  GI: Abdomen soft and nontender.  No hepatosplenomegaly.  Vascular: Trace edema lower leg bilaterally.  Chronic stasis change.  Port-A-Cath without erythema.    Lab Results:    Lab Results   Component Value Date    WBC 5.2 07/06/2022    HGB 10.4 (L) 07/06/2022    HCT 32.7 (L) 07/06/2022    MCV 87.0 07/06/2022    PLT 263 07/06/2022    NEUTROABS 2.6 07/06/2022     Imaging:    No results found.    Medications: I have reviewed the patient's current medications.    Assessment/Plan:  Pancreas cancer  03/22/2022 CT abdomen/pelvis-ill-defined 3.6 x 2.5 cm hypodense mass in the uncinate process of the pancreas; possible associated acute pancreatitis; fatty liver; faint hypodense focus in the inferior right lobe of the liver too small to characterize; no intrahepatic biliary dilatation.    03/23/2022 MRI-hypoenhancing mass in the uncinate process/inferior head of the pancreas abutting the SMV with distortion of the SMV, less than 180 degree involvement by MR but with limited assessment; pancreatic duct narrowing/obstruction and early involvement of the distal common bile duct; subtle hepatic lesion in the right hepatic lobe; hazy  mesentery may reflect concomitant mesenteritis or lymphatic congestion; small lymph nodes adjacent to the masslike area in the head of the pancreas.  03/26/2022 upper EUS-mass identified in the pancreatic head and uncinate process, appearance consistent with adenocarcinoma.  FNA pancreas head showed malignant cells consistent with adenocarcinoma.  04/07/2022 CT abdomen/pelvis pancreatic protocol-adenocarcinoma within the pancreatic uncinate process, isolated right hepatic lobe lesion suspicious for metastatic disease, contact of the SMV by tumor over an approximately 180 degree span with no evidence of occlusion and no arterial involvement, nonspecific borderline enlarged portacaval lymph node, small bowel mesenteric nodes most likely related to mesenteric adenitis/panniculitis  Cycle 1 gemcitabine/Abraxane 04/27/2022  Cycle 2 gemcitabine/Abraxane 05/11/2022  Cycle 3 gemcitabine/Abraxane 05/25/2022  Cycle 4 gemcitabine/Abraxane 06/08/2022  Cycle 5 gemcitabine/Abraxane 06/21/2022, gemcitabine and Abraxane dose reduced due to thrombocytopenia  Cycle 6 gemcitabine/Abraxane 07/05/2022  07/13/2022 CTs-reduced size of pancreatic head mass.  Several new hypodense lesions in the liver.  Cycle 7 gemcitabine/Abraxane 07/20/2022  Diabetes with recent poor control  Diarrhea, typically after eating  Diabetic neuropathy  Hypertension  Hyperlipidemia  Leg edema-04/06/2022 bilateral lower extremities negative for DVT  Severe oropharyngeal candidiasis 05/03/2022-Diflucan  Biliary obstruction secondary to #1  ERCP 05/07/2022-distal common bile duct stricture, placement of covered metal stent, ulcerated tumor invading the duodenum  10.  Fever 07/09/2022 after chemotherapy-evaluated in emergency department, cultures and chest x-ray negative; fever potentially related to Gemcitabine  Disposition: Lynn Blackwell appears stable.  She has completed 6 cycles of gemcitabine/Abraxane.  Performance status continues to be improved.  Recent restaging CTs show  the pancreatic head mass is smaller, right hepatic lobe liver lesion is smaller, several new hypodense liver lesions could be inflammatory or neoplastic.  Her case will be presented at the GI tumor conference for radiology review.  Plan to continue treatment with gemcitabine/Abraxane on a 2-week schedule for now, treatment today.  She agrees with this plan.    CBC and chemistry panel reviewed.  Labs adequate to proceed as above.    She will return for follow-up as scheduled in 2 weeks.    Patient seen with Dr. Truett Perna.    Lonna Cobb ANP/GNP-BC     07/20/2022   11:13 AM    This was a shared visit with Lonna Cobb.  We reviewed the CT findings and images with Lynn Blackwell.  Her clinical status has improved and the pancreas mass is smaller.  The etiology of the new hypodense area in the liver is unclear.  I recommend continuing gemcitabine/Abraxane.  Her case will be presented at the GI tumor conference.    I was present greater 50% of today's visit.  I informed medical decision making.    Mancel Bale, MD      Electronically signed by Ladene Artist, MD at 07/20/2022  5:02 PM EDT

## 2022-07-20 NOTE — Progress Notes (Signed)
[facility] OFFICE PROGRESS NOTE   Diagnosis: Pancreas cancer  INTERVAL HISTORY:   Sarah Davies returns as scheduled.  She completed cycle 6 gemcitabine/Abraxane 07/05/2022.  She denies nausea/vomiting.  No mouth sores.  No diarrhea.  No rash or fever after treatment.  Intermittent cough.  Stable numbness/tingling in the fingertips, predated chemotherapy.  Objective:  Vital signs in last 24 hours:  Blood pressure (!) 141/73, pulse 70, temperature 98.1 F (36.7 C), temperature source Oral, resp. rate 18, height '5\' 6"'$  (1.676 m), weight 214 lb 6.4 oz (97.3 kg), SpO2 99 %.    HEENT: No thrush or ulcers. Resp: Lungs clear bilaterally. Cardio: Regular rate and rhythm. GI: Abdomen soft and nontender.  No hepatosplenomegaly. Vascular: Trace edema lower leg bilaterally.  Chronic stasis change. Port-A-Cath without erythema.  Lab Results:  Lab Results  Component Value Date   WBC 5.2 07/06/2022   HGB 10.4 (L) 07/06/2022   HCT 32.7 (L) 07/06/2022   MCV 87.0 07/06/2022   PLT 263 07/06/2022   NEUTROABS 2.6 07/06/2022    Imaging:  No results found.  Medications: I have reviewed the patient's current medications.  Assessment/Plan: Pancreas cancer 03/22/2022 CT abdomen/pelvis-ill-defined 3.6 x 2.5 cm hypodense mass in the uncinate process of the pancreas; possible associated acute pancreatitis; fatty liver; faint hypodense focus in the inferior right lobe of the liver too small to characterize; no intrahepatic biliary dilatation.   03/23/2022 MRI-hypoenhancing mass in the uncinate process/inferior head of the pancreas abutting the SMV with distortion of the SMV, less than 180 degree involvement by MR but with limited assessment; pancreatic duct narrowing/obstruction and early involvement of the distal common bile duct; subtle hepatic lesion in the right hepatic lobe; hazy mesentery may reflect concomitant mesenteritis or lymphatic congestion; small lymph nodes adjacent to the  masslike area in the head of the pancreas. 03/26/2022 upper EUS-mass identified in the pancreatic head and uncinate process, appearance consistent with adenocarcinoma.  FNA pancreas head showed malignant cells consistent with adenocarcinoma. 04/07/2022 CT abdomen/pelvis pancreatic protocol-adenocarcinoma within the pancreatic uncinate process, isolated right hepatic lobe lesion suspicious for metastatic disease, contact of the SMV by tumor over an approximately 180 degree span with no evidence of occlusion and no arterial involvement, nonspecific borderline enlarged portacaval lymph node, small bowel mesenteric nodes most likely related to mesenteric adenitis/panniculitis Cycle 1 gemcitabine/Abraxane 04/27/2022 Cycle 2 gemcitabine/Abraxane 05/11/2022 Cycle 3 gemcitabine/Abraxane 05/25/2022 Cycle 4 gemcitabine/Abraxane 06/08/2022 Cycle 5 gemcitabine/Abraxane 06/21/2022, gemcitabine and Abraxane dose reduced due to thrombocytopenia Cycle 6 gemcitabine/Abraxane 07/05/2022 07/13/2022 CTs-reduced size of pancreatic head mass.  Several new hypodense lesions in the liver. Cycle 7 gemcitabine/Abraxane 07/20/2022 Diabetes with recent poor control Diarrhea, typically after eating Diabetic neuropathy Hypertension Hyperlipidemia Leg edema-04/06/2022 bilateral lower extremities negative for DVT Severe oropharyngeal candidiasis 05/03/2022-Diflucan 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  Fever 07/09/2022 after chemotherapy-evaluated in emergency department, cultures and chest x-ray negative; fever potentially related to Gemcitabine    Disposition: Ms. Boyum appears stable.  She has completed 6 cycles of gemcitabine/Abraxane.  Performance status continues to be improved.  Recent restaging CTs show the pancreatic head mass is smaller, right hepatic lobe liver lesion is smaller, several new hypodense  liver lesions could be inflammatory or neoplastic.  Her case will be presented at the GI tumor conference for radiology review.  Plan to continue treatment with gemcitabine/Abraxane on a 2-week schedule for now, treatment today.  She agrees with  this plan.  CBC and chemistry panel reviewed.  Labs adequate to proceed as above.  She will return for follow-up as scheduled in 2 weeks.  Patient seen with Dr. Benay Spice.  Ned Card ANP/GNP-BC   07/20/2022  11:13 AM  This was a shared visit with Ned Card.  We reviewed the CT findings and images with Ms. Mostek.  Her clinical status has improved and the pancreas mass is smaller.  The etiology of the "new "hypodense area in the liver is unclear.  I recommend continuing gemcitabine/Abraxane.  Her case will be presented at the GI tumor conference.  I was present greater 50% of today's visit.  I informed medical decision making.  Julieanne Manson, MD

## 2022-07-20 NOTE — Patient Instructions (Signed)
[facility]   Discharge Instructions: Thank you for choosing [facility] to provide your oncology and hematology care.   If you have a lab appointment with the [facility], please go directly to the [facility] and check in at the registration area.   Wear comfortable clothing and clothing appropriate for easy access to any Portacath or PICC line.   We strive to give you quality time with your provider. You may need to reschedule your appointment if you arrive late (15 or more minutes).  Arriving late affects you and other patients whose appointments are after yours.  Also, if you miss three or more appointments without notifying the office, you may be dismissed from the clinic at the provider's discretion.      For prescription refill requests, have your pharmacy contact our office and allow 72 hours for refills to be completed.    Today you received the following chemotherapy and/or immunotherapy agents Paclitaxel-protein bound (ABRAXANE) & Gemcitabine (GEMZAR).      To help prevent nausea and vomiting after your treatment, we encourage you to take your nausea medication as directed.  BELOW ARE SYMPTOMS THAT SHOULD BE REPORTED IMMEDIATELY: *FEVER GREATER THAN 100.4 F (38 C) OR HIGHER *CHILLS OR SWEATING *NAUSEA AND VOMITING THAT IS NOT CONTROLLED WITH YOUR NAUSEA MEDICATION *UNUSUAL SHORTNESS OF BREATH *UNUSUAL BRUISING OR BLEEDING *URINARY PROBLEMS (pain or burning when urinating, or frequent urination) *BOWEL PROBLEMS (unusual diarrhea, constipation, pain near the anus) TENDERNESS IN MOUTH AND THROAT WITH OR WITHOUT PRESENCE OF ULCERS (sore throat, sores in mouth, or a toothache) UNUSUAL RASH, SWELLING OR PAIN  UNUSUAL VAGINAL DISCHARGE OR ITCHING   Items with * indicate a potential emergency and should be followed up as soon as possible or go to the Emergency Department if any problems should occur.  Please show the CHEMOTHERAPY ALERT  CARD or IMMUNOTHERAPY ALERT CARD at check-in to the Emergency Department and triage nurse.  Should you have questions after your visit or need to cancel or reschedule your appointment, please contact [facility]  Dept: [phone]  and follow the prompts.  Office hours are 8:00 a.m. to 4:30 p.m. Monday - Friday. Please note that voicemails left after 4:00 p.m. may not be returned until the following business day.  We are closed weekends and major holidays. You have access to a nurse at all times for urgent questions. Please call the main number to the clinic Dept: [phone] and follow the prompts.   For any non-urgent questions, you may also contact your provider using MyChart. We now offer e-Visits for anyone 60 and older to request care online for non-urgent symptoms. For details visit [email or web address].   Also download the MyChart app! Go to the app store, search "MyChart", open the app, select [facility], and log in with your MyChart username and password.  Masks are optional in the cancer centers. If you would like for your care team to wear a mask while they are taking care of you, please let them know. You may have one support person who is at least 80 years old accompany you for your appointments.  Paclitaxel Nanoparticle Albumin-Bound Injection What is this medication? NANOPARTICLE ALBUMIN-BOUND PACLITAXEL (Na no PAHR ti kuhl al BYOO muhn-bound PAK li TAX el) treats some types of cancer. It works by slowing down the growth of cancer cells. This medicine may be used for other purposes; ask your health care provider or pharmacist if you  have questions. COMMON BRAND NAME(S): Abraxane What should I tell my care team before I take this medication? They need to know if you have any of these conditions: Liver disease Low white blood cell levels An unusual or allergic reaction to paclitaxel, albumin, other medications, foods, dyes, or preservatives If you  or your partner are pregnant or trying to get pregnant Breast-feeding How should I use this medication? This medication is injected into a vein. It is given by your care team in a hospital or clinic setting. Talk to your care team about the use of this medication in children. Special care may be needed. Overdosage: If you think you have taken too much of this medicine contact a poison control center or emergency room at once. NOTE: This medicine is only for you. Do not share this medicine with others. What if I miss a dose? Keep appointments for follow-up doses. It is important not to miss your dose. Call your care team if you are unable to keep an appointment. What may interact with this medication? Other medications may affect the way this medication works. Talk with your care team about all of the medications you take. They may suggest changes to your treatment plan to lower the risk of side effects and to make sure your medications work as intended. This list may not describe all possible interactions. Give your health care provider a list of all the medicines, herbs, non-prescription drugs, or dietary supplements you use. Also tell them if you smoke, drink alcohol, or use illegal drugs. Some items may interact with your medicine. What should I watch for while using this medication? Your condition will be monitored carefully while you are receiving this medication. You may need blood work while taking this medication. This medication may make you feel generally unwell. This is not uncommon as chemotherapy can affect healthy cells as well as cancer cells. Report any side effects. Continue your course of treatment even though you feel ill unless your care team tells you to stop. This medication can cause serious allergic reactions. To reduce the risk, your care team may give you other medications to take before receiving this one. Be sure to follow the directions from your care team. This  medication may increase your risk of getting an infection. Call your care team for advice if you get a fever, chills, sore throat, or other symptoms of a cold or flu. Do not treat yourself. Try to avoid being around people who are sick. This medication may increase your risk to bruise or bleed. Call your care team if you notice any unusual bleeding. Be careful brushing or flossing your teeth or using a toothpick because you may get an infection or bleed more easily. If you have any dental work done, tell your dentist you are receiving this medication. Talk to your care team if you or your partner may be pregnant. Serious birth defects can occur if you take this medication during pregnancy and for 6 months after the last dose. You will need a negative pregnancy test before starting this medication. Contraception is recommended while taking this medication and for 6 months after the last dose. Your care team can help you find the option that works for you. If your partner can get pregnant, use a condom during sex while taking this medication and for 3 months after the last dose. Do not breastfeed while taking this medication and for 2 weeks after the last dose. This medication may cause infertility. Talk  to your care team if you are concerned about your fertility. What side effects may I notice from receiving this medication? Side effects that you should report to your care team as soon as possible: Allergic reactions--skin rash, itching, hives, swelling of the face, lips, tongue, or throat Dry cough, shortness of breath or trouble breathing Infection--fever, chills, cough, sore throat, wounds that don't heal, pain or trouble when passing urine, general feeling of discomfort or being unwell Low red blood cell level--unusual weakness or fatigue, dizziness, headache, trouble breathing Pain, tingling, or numbness in the hands or feet Stomach pain, unusual weakness or fatigue, nausea, vomiting, diarrhea, or  fever that lasts longer than expected Unusual bruising or bleeding Side effects that usually do not require medical attention (report to your care team if they continue or are bothersome): Diarrhea Fatigue Hair loss Loss of appetite Nausea Vomiting This list may not describe all possible side effects. Call your doctor for medical advice about side effects. You may report side effects to FDA at 1-800-FDA-1088. Where should I keep my medication? This medication is given in a hospital or clinic. It will not be stored at home. NOTE: This sheet is a summary. It may not cover all possible information. If you have questions about this medicine, talk to your doctor, pharmacist, or health care provider.  2023 Elsevier/Gold Standard (2022-01-27 00:00:00)  Gemcitabine Injection What is this medication? GEMCITABINE (jem SYE ta been) treats some types of cancer. It works by slowing down the growth of cancer cells. This medicine may be used for other purposes; ask your health care provider or pharmacist if you have questions. COMMON BRAND NAME(S): Gemzar, Infugem What should I tell my care team before I take this medication? They need to know if you have any of these conditions: Blood disorders Infection Kidney disease Liver disease Lung or breathing disease, such as asthma or COPD Recent or ongoing radiation therapy An unusual or allergic reaction to gemcitabine, other medications, foods, dyes, or preservatives If you or your partner are pregnant or trying to get pregnant Breast-feeding How should I use this medication? This medication is injected into a vein. It is given by your care team in a hospital or clinic setting. Talk to your care team about the use of this medication in children. Special care may be needed. Overdosage: If you think you have taken too much of this medicine contact a poison control center or emergency room at once. NOTE: This medicine is only for you. Do not share this  medicine with others. What if I miss a dose? Keep appointments for follow-up doses. It is important not to miss your dose. Call your care team if you are unable to keep an appointment. What may interact with this medication? Interactions have not been studied. This list may not describe all possible interactions. Give your health care provider a list of all the medicines, herbs, non-prescription drugs, or dietary supplements you use. Also tell them if you smoke, drink alcohol, or use illegal drugs. Some items may interact with your medicine. What should I watch for while using this medication? Your condition will be monitored carefully while you are receiving this medication. This medication may make you feel generally unwell. This is not uncommon, as chemotherapy can affect healthy cells as well as cancer cells. Report any side effects. Continue your course of treatment even though you feel ill unless your care team tells you to stop. In some cases, you may be given additional medications to help  with side effects. Follow all directions for their use. This medication may increase your risk of getting an infection. Call your care team for advice if you get a fever, chills, sore throat, or other symptoms of a cold or flu. Do not treat yourself. Try to avoid being around people who are sick. This medication may increase your risk to bruise or bleed. Call your care team if you notice any unusual bleeding. Be careful brushing or flossing your teeth or using a toothpick because you may get an infection or bleed more easily. If you have any dental work done, tell your dentist you are receiving this medication. Avoid taking medications that contain aspirin, acetaminophen, ibuprofen, naproxen, or ketoprofen unless instructed by your care team. These medications may hide a fever. Talk to your care team if you or your partner wish to become pregnant or think you might be pregnant. This medication can cause  serious birth defects if taken during pregnancy and for 6 months after the last dose. A negative pregnancy test is required before starting this medication. A reliable form of contraception is recommended while taking this medication and for 6 months after the last dose. Talk to your care team about effective forms of contraception. Do not father a child while taking this medication and for 3 months after the last dose. Use a condom while having sex during this time period. Do not breastfeed while taking this medication and for at least 1 week after the last dose. This medication may cause infertility. Talk to your care team if you are concerned about your fertility. What side effects may I notice from receiving this medication? Side effects that you should report to your care team as soon as possible: Allergic reactions--skin rash, itching, hives, swelling of the face, lips, tongue, or throat Capillary leak syndrome--stomach or muscle pain, unusual weakness or fatigue, feeling faint or lightheaded, decrease in the amount of urine, swelling of the ankles, hands, or feet, trouble breathing Infection--fever, chills, cough, sore throat, wounds that don't heal, pain or trouble when passing urine, general feeling of discomfort or being unwell Liver injury--right upper belly pain, loss of appetite, nausea, light-colored stool, dark yellow or brown urine, yellowing skin or eyes, unusual weakness or fatigue Low red blood cell level--unusual weakness or fatigue, dizziness, headache, trouble breathing Lung injury--shortness of breath or trouble breathing, cough, spitting up blood, chest pain, fever Stomach pain, bloody diarrhea, pale skin, unusual weakness or fatigue, decrease in the amount of urine, which may be signs of hemolytic uremic syndrome Sudden and severe headache, confusion, change in vision, seizures, which may be signs of posterior reversible encephalopathy syndrome (PRES) Unusual bruising or  bleeding Side effects that usually do not require medical attention (report to your care team if they continue or are bothersome): Diarrhea Drowsiness Hair loss Nausea Pain, redness, or swelling with sores inside the mouth or throat Vomiting This list may not describe all possible side effects. Call your doctor for medical advice about side effects. You may report side effects to FDA at 1-800-FDA-1088. Where should I keep my medication? This medication is given in a hospital or clinic. It will not be stored at home. NOTE: This sheet is a summary. It may not cover all possible information. If you have questions about this medicine, talk to your doctor, pharmacist, or health care provider.  2023 Elsevier/Gold Standard (2022-01-27 00:00:00)

## 2022-07-20 NOTE — Progress Notes (Signed)
Patient seen by Lisa Thomas NP today  Vitals are within treatment parameters.  Labs reviewed by Lisa Thomas NP and are within treatment parameters.  Per physician team, patient is ready for treatment and there are NO modifications to the treatment plan.     

## 2022-07-21 ENCOUNTER — Other Ambulatory Visit: Payer: Medicare PPO | Admitting: Pharmacist

## 2022-07-21 DIAGNOSIS — E11649 Type 2 diabetes mellitus with hypoglycemia without coma: Secondary | ICD-10-CM

## 2022-07-21 NOTE — Progress Notes (Signed)
07/21/2022 Name: Sarah Davies MRN: [id number] DOB: 10-Sep-1941  Chief Complaint  Patient presents with   Medication Management   Diabetes    Sarah Davies is a 81 y.o. year old female who presented for a telephone visit.   They were referred to the pharmacist by their PCP for assistance in managing diabetes.   Subjective:  Care Team: Primary Care Provider: Fenton Foy, NP ; Next Scheduled Visit: 10/18/22 Oncology: Benay Spice; Next Scheduled Visit: 08/03/22  Medication Access/Adherence  Current Pharmacy:  [facility] [place] [address] Phone: [phone] Fax: [phone]   Patient reports affordability concerns with their medications: No  Patient reports access/transportation concerns to their pharmacy: No  Patient reports adherence concerns with their medications:  No     Diabetes:  Current medications: Lantus 20 units daily, glipizide 10 mg twice daily  Fasting: 80-120s Post breakfast/lunch: 180-200s ; 260-280s after supper  Patient denies hypoglycemic s/sx including dizziness, shakiness, sweating. Patient denies hyperglycemic symptoms including polyuria, polydipsia, polyphagia, nocturia, neuropathy, blurred vision.  Current meal patterns:  - Breakfast: [place] bacon, eggs; fruit (strawberries) - Lunch: sometimes sandwiches; sliced plums;  - Supper: noodles and beef, spinach; spaghetti and meatballs - Snacks: nuts, yogurt  - Drinks: water   Health Maintenance  Health Maintenance Due  Topic Date Due   OPHTHALMOLOGY EXAM  Never done   Diabetic kidney evaluation - Urine ACR  Never done   TETANUS/TDAP  Never done   Zoster Vaccines- Shingrix (1 of 2) Never done   DEXA SCAN  Never done   Pneumonia Vaccine 19+ Years old (2 - PPSV23 or PCV20) 07/27/2017   COVID-19 Vaccine (3 - Pfizer risk series) 01/30/2020   INFLUENZA VACCINE  05/11/2022     Objective: Lab Results  Component  Value Date   HGBA1C 10.0 (A) 07/15/2022   HGBA1C 10.0 07/15/2022   HGBA1C 10.0 (A) 07/15/2022   HGBA1C 10.0 (A) 07/15/2022    Lab Results  Component Value Date   CREATININE 0.73 07/20/2022   BUN 15 07/20/2022   NA 139 07/20/2022   K 4.1 07/20/2022   CL 104 07/20/2022   CO2 28 07/20/2022    No results found for: "CHOL", "HDL", "LDLCALC", "LDLDIRECT", "TRIG", "CHOLHDL"  Medications Reviewed Today     Reviewed by Fenton Foy, NP (Nurse Practitioner) on 07/15/22 at 67  Med List Status: <None>   Medication Order Taking? Sig Documenting Provider Last Dose Status Informant  ACCU-CHEK GUIDE test strip [id number] Yes PLEASE SEE ATTACHED FOR DETAILED DIRECTIONS [provider] Taking Active Self, Pharmacy Records  Accu-Chek Softclix Lancets lancets [id number] Yes 4 (four) times daily. [provider] Taking Active Self, Pharmacy Records  acetaminophen (TYLENOL) 500 MG tablet [id number] Yes Take 1,000 mg by mouth every 6 (six) hours as needed for moderate pain. [provider] Taking Active Self, Pharmacy Records  b complex vitamins tablet [id number] Yes Take 1 tablet by mouth daily. [provider] Taking Active Self, Pharmacy Records  blood glucose meter kit and supplies [id number] Yes Dispense based on patient and insurance preference. Use up to four times daily as directed. (FOR ICD-10 E10.9, E11.9). Elmarie Shiley, MD Taking Active Self, Pharmacy Records  calcium carbonate (OS-CAL) 600 MG TABS [id number] Yes Take 600 mg by mouth 2 (two) times daily with a meal. [provider] Taking Active Self, Pharmacy Records  carvedilol (COREG) 25 MG tablet [id number] Yes Take 2 tablets (50 mg total) by mouth 2 (  two) times daily with a meal. Fenton Foy, NP Taking Active   cholecalciferol (VITAMIN D) 1000 UNITS tablet [id number] Yes Take 1,000 Units by mouth daily. [provider] Taking Active Self, Pharmacy Records  Continuous Blood Gluc  Receiver (FREESTYLE LIBRE 2 READER) [place] [id number] Yes Use to check blood sugar continuously - sent to DME supplier [facility] [phone]) Fenton Foy, NP Taking Active Self, Pharmacy Records  Continuous Blood Gluc Sensor ([place] 2 SENSOR) [place] [id number] Yes Use to check blood sugar continuously - sent to DME supplier [facility] [phone]) Fenton Foy, NP Taking Active Self, Pharmacy Records  doxazosin (CARDURA) 4 MG tablet [id number] Yes Take 4 mg by mouth at bedtime.  [provider] Taking Active Self, Pharmacy Records  furosemide (LASIX) 20 MG tablet [id number] Yes Take 20 mg by mouth daily.  [provider] Taking Active Self, Pharmacy Records  glipiZIDE (GLUCOTROL) 10 MG tablet [id number] Yes Take 1 tablet (10 mg total) by mouth 2 (two) times daily with a meal. Fenton Foy, NP Taking Active   insulin glargine (LANTUS) 100 UNIT/ML Solostar Pen [id number] Yes Inject 20 Units into the skin daily. Fenton Foy, NP Taking Active   Insulin Pen Needle (PEN NEEDLES 3/16") 31G X 5 MM MISC [id number]  1 Application by Does not apply route daily. Fenton Foy, NP  Active   lipase/protease/amylase (CREON) 36000 UNITS CPEP capsule [id number] Yes Take 1 capsule (36,000 Units total) by mouth 3 (three) times daily before meals. Owens Shark, NP Taking Active Pharmacy Records  lisinopril (ZESTRIL) 20 MG tablet [id number] Yes Take 20 mg by mouth daily. [provider] Taking Active Self, Pharmacy Records  Magnesium 500 MG TABS [id number] Yes Take 500 mg by mouth daily. [provider] Taking Active Self, Pharmacy Records  potassium chloride SA (KLOR-CON M) 20 MEQ tablet [id number] Yes Take 1 tablet (20 mEq total) by mouth daily. Ladell Pier, MD Taking Active   prochlorperazine (COMPAZINE) 10 MG tablet [id number]     Active   traMADol (ULTRAM) 50 MG tablet [id number] Yes Take 1 tablet (50 mg total) by mouth every 6 (six)  hours as needed for moderate pain. Owens Shark, NP Taking Active               Assessment/Plan:   Diabetes: - Currently uncontrolled - Post prandial readings uncontrolled on max dose glipizide. Options at this time include transition from glipizide to prandial insulin vs consideration for non-insulin agents. Discussed history of non insulin medications. Could consider incorporation of metformin - patient notes a history of intolerance to IR metformin, but not ER. Liver enzymes and renal function is normal. Only consideration would be frequency of which patient is receiving contrast with CT. Will discuss with PCP and Dr. Benay Spice. Could also consider checking c-peptide to determine if endogenous insulin production.  Follow Up Plan: phone call in 4 weeks  Catie TJodi Mourning, PharmD, [facility] [phone]

## 2022-07-22 LAB — CANCER ANTIGEN 19-9: CA 19-9: 183 U/mL — ABNORMAL HIGH (ref 0–35)

## 2022-07-23 ENCOUNTER — Other Ambulatory Visit: Payer: Self-pay

## 2022-07-23 ENCOUNTER — Other Ambulatory Visit (HOSPITAL_BASED_OUTPATIENT_CLINIC_OR_DEPARTMENT_OTHER): Payer: Self-pay

## 2022-07-23 MED ORDER — FREESTYLE LIBRE 2 READER DEVI: 0 refills | Status: DC

## 2022-07-23 MED ORDER — FREESTYLE LIBRE 2 SENSOR MISC: 3 refills | Status: DC

## 2022-07-23 NOTE — Progress Notes (Signed)
Medical supply store told patient that she needed her med sent to a regular pharmacy. I have sent to her local pharmacy per patient request.

## 2022-07-28 ENCOUNTER — Other Ambulatory Visit: Payer: Self-pay

## 2022-07-28 NOTE — Progress Notes (Signed)
Formatting of this note might be different from the original.  The proposed treatment discussed in conference is for discussion purpose only and is not a binding recommendation.  The patients have not been physically examined, or presented with their treatment options.  Therefore, final treatment plans cannot be decided.    Electronically signed by Marily Lente, RN at 07/28/2022 10:10 AM EDT

## 2022-07-28 NOTE — Progress Notes (Signed)
The proposed treatment discussed in conference is for discussion purpose only and is not a binding recommendation.  The patients have not been physically examined, or presented with their treatment options.  Therefore, final treatment plans cannot be decided.  

## 2022-07-29 ENCOUNTER — Telehealth: Payer: Self-pay | Admitting: *Deleted

## 2022-07-29 NOTE — Telephone Encounter (Signed)
Has a cancer policy that needs her diagnosis code and CPT codes for procedures she has had done and pathology reports. Information at front desk for pick up.

## 2022-07-31 ENCOUNTER — Other Ambulatory Visit: Payer: Self-pay | Admitting: Oncology

## 2022-08-03 ENCOUNTER — Inpatient Hospital Stay: Payer: Medicare PPO | Admitting: Oncology

## 2022-08-03 ENCOUNTER — Inpatient Hospital Stay: Payer: Medicare PPO

## 2022-08-03 ENCOUNTER — Encounter: Payer: Self-pay | Admitting: *Deleted

## 2022-08-03 VITALS — BP 132/62 | HR 60 | Temp 98.2°F | Resp 18 | Ht 66.0 in | Wt 212.1 lb

## 2022-08-03 VITALS — BP 155/77 | HR 62

## 2022-08-03 DIAGNOSIS — C250 Malignant neoplasm of head of pancreas: Secondary | ICD-10-CM

## 2022-08-03 DIAGNOSIS — Z5111 Encounter for antineoplastic chemotherapy: Secondary | ICD-10-CM | POA: Diagnosis not present

## 2022-08-03 DIAGNOSIS — Z23 Encounter for immunization: Secondary | ICD-10-CM

## 2022-08-03 LAB — CBC WITH DIFFERENTIAL (CANCER CENTER ONLY)
Abs Immature Granulocytes: 0.02 10*3/uL (ref 0.00–0.07)
Basophils Absolute: 0 10*3/uL (ref 0.0–0.1)
Basophils Relative: 1 %
Eosinophils Absolute: 0.2 10*3/uL (ref 0.0–0.5)
Eosinophils Relative: 4 %
HCT: 31.8 % — ABNORMAL LOW (ref 36.0–46.0)
Hemoglobin: 10.1 g/dL — ABNORMAL LOW (ref 12.0–15.0)
Immature Granulocytes: 0 %
Lymphocytes Relative: 20 %
Lymphs Abs: 1.3 10*3/uL (ref 0.7–4.0)
MCH: 27.8 pg (ref 26.0–34.0)
MCHC: 31.8 g/dL (ref 30.0–36.0)
MCV: 87.6 fL (ref 80.0–100.0)
Monocytes Absolute: 0.9 10*3/uL (ref 0.1–1.0)
Monocytes Relative: 14 %
Neutro Abs: 3.9 10*3/uL (ref 1.7–7.7)
Neutrophils Relative %: 61 %
Platelet Count: 192 10*3/uL (ref 150–400)
RBC: 3.63 MIL/uL — ABNORMAL LOW (ref 3.87–5.11)
RDW: 16 % — ABNORMAL HIGH (ref 11.5–15.5)
WBC Count: 6.3 10*3/uL (ref 4.0–10.5)
nRBC: 0 % (ref 0.0–0.2)

## 2022-08-03 LAB — CMP (CANCER CENTER ONLY)
ALT: 31 U/L (ref 0–44)
AST: 31 U/L (ref 15–41)
Albumin: 3.5 g/dL (ref 3.5–5.0)
Alkaline Phosphatase: 65 U/L (ref 38–126)
Anion gap: 9 (ref 5–15)
BUN: 16 mg/dL (ref 8–23)
CO2: 28 mmol/L (ref 22–32)
Calcium: 9.5 mg/dL (ref 8.9–10.3)
Chloride: 101 mmol/L (ref 98–111)
Creatinine: 0.59 mg/dL (ref 0.44–1.00)
GFR, Estimated: >60 mL/min (ref >60)
Glucose, Bld: 147 mg/dL — ABNORMAL HIGH (ref 70–99)
Potassium: 3.9 mmol/L (ref 3.5–5.1)
Sodium: 138 mmol/L (ref 135–145)
Total Bilirubin: 0.5 mg/dL (ref 0.3–1.2)
Total Protein: 6.6 g/dL (ref 6.5–8.1)

## 2022-08-03 MED ORDER — PACLITAXEL PROTEIN-BOUND CHEMO INJECTION 100 MG
80.0000 mg/m2 | Freq: Once | INTRAVENOUS | Status: AC
  Administered 2022-08-03: 175 mg via INTRAVENOUS
  Filled 2022-08-03: qty 35

## 2022-08-03 MED ORDER — HEPARIN SOD (PORK) LOCK FLUSH 100 UNIT/ML IV SOLN
500.0000 [IU] | Freq: Once | INTRAVENOUS | Status: AC | PRN
  Administered 2022-08-03: 500 [IU]

## 2022-08-03 MED ORDER — SODIUM CHLORIDE 0.9 % IV SOLN
800.0000 mg/m2 | Freq: Once | INTRAVENOUS | Status: AC
  Administered 2022-08-03: 1748 mg via INTRAVENOUS
  Filled 2022-08-03: qty 21.04

## 2022-08-03 MED ORDER — SODIUM CHLORIDE 0.9% FLUSH
10.0000 mL | INTRAVENOUS | Status: DC | PRN
  Administered 2022-08-03: 10 mL

## 2022-08-03 MED ORDER — INFLUENZA VAC A&B SA ADJ QUAD 0.5 ML IM PRSY
0.5000 mL | PREFILLED_SYRINGE | Freq: Once | INTRAMUSCULAR | Status: AC
  Administered 2022-08-03: 0.5 mL via INTRAMUSCULAR
  Filled 2022-08-03: qty 0.5

## 2022-08-03 MED ORDER — PROCHLORPERAZINE MALEATE 10 MG PO TABS
10.0000 mg | ORAL_TABLET | Freq: Once | ORAL | Status: AC
  Administered 2022-08-03: 10 mg via ORAL
  Filled 2022-08-03: qty 1

## 2022-08-03 MED ORDER — SODIUM CHLORIDE 0.9 % IV SOLN: Freq: Once | INTRAVENOUS | Status: AC

## 2022-08-03 NOTE — Patient Instructions (Signed)

## 2022-08-03 NOTE — Progress Notes (Signed)
[facility] OFFICE PROGRESS NOTE   Diagnosis: Pancreas cancer  INTERVAL HISTORY:   Ms. Sarah Davies returns as scheduled.  She complete another treatment of gemcitabine/Abraxane on 07/20/2022.  No fever.  No change in baseline neuropathy symptoms.  She has abdominal "tightness ".  She takes Tylenol as needed.  She has mild nausea today. Objective:  Vital signs in last 24 hours:  Blood pressure 132/62, pulse 60, temperature 98.2 F (36.8 C), temperature source Oral, resp. rate 18, height '5\' 6"'$  (1.676 m), weight 212 lb 1.6 oz (96.2 kg), SpO2 99 %.    HEENT: No thrush or ulcers Resp: Lungs clear bilaterally Cardio: Regular rate and rhythm GI: No hepatosplenomegaly, nontender, no mass Vascular: No leg edema    Portacath/PICC-without erythema  Lab Results:  Lab Results  Component Value Date   WBC 6.9 07/20/2022   HGB 10.7 (L) 07/20/2022   HCT 33.8 (L) 07/20/2022   MCV 87.6 07/20/2022   PLT 153 07/20/2022   NEUTROABS 4.1 07/20/2022    CMP  Lab Results  Component Value Date   NA 139 07/20/2022   K 4.1 07/20/2022   CL 104 07/20/2022   CO2 28 07/20/2022   GLUCOSE 142 (H) 07/20/2022   BUN 15 07/20/2022   CREATININE 0.73 07/20/2022   CALCIUM 9.6 07/20/2022   PROT 7.5 07/20/2022   ALBUMIN 3.7 07/20/2022   AST 39 07/20/2022   ALT 32 07/20/2022   ALKPHOS 68 07/20/2022   BILITOT 0.5 07/20/2022   GFRNONAA >60 07/20/2022    Lab Results  Component Value Date   CAN199 183 (H) 07/20/2022     Medications: I have reviewed the patient's current medications.   Assessment/Plan: Pancreas cancer 03/22/2022 CT abdomen/pelvis-ill-defined 3.6 x 2.5 cm hypodense mass in the uncinate process of the pancreas; possible associated acute pancreatitis; fatty liver; faint hypodense focus in the inferior right lobe of the liver too small to characterize; no intrahepatic biliary dilatation.   03/23/2022 MRI-hypoenhancing mass in the uncinate process/inferior head of the pancreas  abutting the SMV with distortion of the SMV, less than 180 degree involvement by MR but with limited assessment; pancreatic duct narrowing/obstruction and early involvement of the distal common bile duct; subtle hepatic lesion in the right hepatic lobe; hazy mesentery may reflect concomitant mesenteritis or lymphatic congestion; small lymph nodes adjacent to the masslike area in the head of the pancreas. 03/26/2022 upper EUS-mass identified in the pancreatic head and uncinate process, appearance consistent with adenocarcinoma.  FNA pancreas head showed malignant cells consistent with adenocarcinoma. 04/07/2022 CT abdomen/pelvis pancreatic protocol-adenocarcinoma within the pancreatic uncinate process, isolated right hepatic lobe lesion suspicious for metastatic disease, contact of the SMV by tumor over an approximately 180 degree span with no evidence of occlusion and no arterial involvement, nonspecific borderline enlarged portacaval lymph node, small bowel mesenteric nodes most likely related to mesenteric adenitis/panniculitis Cycle 1 gemcitabine/Abraxane 04/27/2022 Cycle 2 gemcitabine/Abraxane 05/11/2022 Cycle 3 gemcitabine/Abraxane 05/25/2022 Cycle 4 gemcitabine/Abraxane 06/08/2022 Cycle 5 gemcitabine/Abraxane 06/21/2022, gemcitabine and Abraxane dose reduced due to thrombocytopenia Cycle 6 gemcitabine/Abraxane 07/05/2022 07/13/2022 CTs-reduced size of pancreatic head mass.  Several new hypodense lesions in the liver. 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  Fever 07/09/2022  after chemotherapy-evaluated in emergency  department, cultures and chest x-ray negative; fever potentially related to Gemcitabine     Disposition: Ms. Gasparyan appears stable.  She continues to tolerate the gemcitabine/Abraxane well.  There is no clinical evidence for disease progression.  The CA 19-9 was lower when she was here 2 weeks ago.  We will follow-up on the CA 19-9 from today. Present her case at the GI tumor conference last week.  The dominant lesion in the posterior liver does not have the appearance of a metastasis.  There is a lesion at the dome of the liver could be a new metastasis.  An MRI was recommended to further evaluate this lesion.  We discussed proceeding with an MRI versus a follow-up CT.  Ms. Blankenburg is comfortable continuing gemcitabine/Abraxane with a CT at a 2-18-monthinterval.  She will complete another cycle of gemcitabine/Abraxane today.  She will return for an office visit and chemotherapy in 2 weeks.  GBetsy Coder MD  08/03/2022  10:44 AM

## 2022-08-03 NOTE — Patient Instructions (Signed)
[facility]   Discharge Instructions: Thank you for choosing [facility] to provide your oncology and hematology care.   If you have a lab appointment with the [facility], please go directly to the [facility] and check in at the registration area.   Wear comfortable clothing and clothing appropriate for easy access to any Portacath or PICC line.   We strive to give you quality time with your provider. You may need to reschedule your appointment if you arrive late (15 or more minutes).  Arriving late affects you and other patients whose appointments are after yours.  Also, if you miss three or more appointments without notifying the office, you may be dismissed from the clinic at the provider's discretion.      For prescription refill requests, have your pharmacy contact our office and allow 72 hours for refills to be completed.    Today you received the following chemotherapy and/or immunotherapy agents Paclitaxel-protein bound (ABRAXANE) & Gemcitabine (GEMZAR).      To help prevent nausea and vomiting after your treatment, we encourage you to take your nausea medication as directed.  BELOW ARE SYMPTOMS THAT SHOULD BE REPORTED IMMEDIATELY: *FEVER GREATER THAN 100.4 F (38 C) OR HIGHER *CHILLS OR SWEATING *NAUSEA AND VOMITING THAT IS NOT CONTROLLED WITH YOUR NAUSEA MEDICATION *UNUSUAL SHORTNESS OF BREATH *UNUSUAL BRUISING OR BLEEDING *URINARY PROBLEMS (pain or burning when urinating, or frequent urination) *BOWEL PROBLEMS (unusual diarrhea, constipation, pain near the anus) TENDERNESS IN MOUTH AND THROAT WITH OR WITHOUT PRESENCE OF ULCERS (sore throat, sores in mouth, or a toothache) UNUSUAL RASH, SWELLING OR PAIN  UNUSUAL VAGINAL DISCHARGE OR ITCHING   Items with * indicate a potential emergency and should be followed up as soon as possible or go to the Emergency Department if any problems should occur.  Please show the CHEMOTHERAPY ALERT  CARD or IMMUNOTHERAPY ALERT CARD at check-in to the Emergency Department and triage nurse.  Should you have questions after your visit or need to cancel or reschedule your appointment, please contact [facility]  Dept: [phone]  and follow the prompts.  Office hours are 8:00 a.m. to 4:30 p.m. Monday - Friday. Please note that voicemails left after 4:00 p.m. may not be returned until the following business day.  We are closed weekends and major holidays. You have access to a nurse at all times for urgent questions. Please call the main number to the clinic Dept: [phone] and follow the prompts.   For any non-urgent questions, you may also contact your provider using MyChart. We now offer e-Visits for anyone 60 and older to request care online for non-urgent symptoms. For details visit [email or web address].   Also download the MyChart app! Go to the app store, search "MyChart", open the app, select [facility], and log in with your MyChart username and password.  Masks are optional in the cancer centers. If you would like for your care team to wear a mask while they are taking care of you, please let them know. You may have one support person who is at least 80 years old accompany you for your appointments.  Paclitaxel Nanoparticle Albumin-Bound Injection What is this medication? NANOPARTICLE ALBUMIN-BOUND PACLITAXEL (Na no PAHR ti kuhl al BYOO muhn-bound PAK li TAX el) treats some types of cancer. It works by slowing down the growth of cancer cells. This medicine may be used for other purposes; ask your health care provider or pharmacist if you  have questions. COMMON BRAND NAME(S): Abraxane What should I tell my care team before I take this medication? They need to know if you have any of these conditions: Liver disease Low white blood cell levels An unusual or allergic reaction to paclitaxel, albumin, other medications, foods, dyes, or preservatives If you  or your partner are pregnant or trying to get pregnant Breast-feeding How should I use this medication? This medication is injected into a vein. It is given by your care team in a hospital or clinic setting. Talk to your care team about the use of this medication in children. Special care may be needed. Overdosage: If you think you have taken too much of this medicine contact a poison control center or emergency room at once. NOTE: This medicine is only for you. Do not share this medicine with others. What if I miss a dose? Keep appointments for follow-up doses. It is important not to miss your dose. Call your care team if you are unable to keep an appointment. What may interact with this medication? Other medications may affect the way this medication works. Talk with your care team about all of the medications you take. They may suggest changes to your treatment plan to lower the risk of side effects and to make sure your medications work as intended. This list may not describe all possible interactions. Give your health care provider a list of all the medicines, herbs, non-prescription drugs, or dietary supplements you use. Also tell them if you smoke, drink alcohol, or use illegal drugs. Some items may interact with your medicine. What should I watch for while using this medication? Your condition will be monitored carefully while you are receiving this medication. You may need blood work while taking this medication. This medication may make you feel generally unwell. This is not uncommon as chemotherapy can affect healthy cells as well as cancer cells. Report any side effects. Continue your course of treatment even though you feel ill unless your care team tells you to stop. This medication can cause serious allergic reactions. To reduce the risk, your care team may give you other medications to take before receiving this one. Be sure to follow the directions from your care team. This  medication may increase your risk of getting an infection. Call your care team for advice if you get a fever, chills, sore throat, or other symptoms of a cold or flu. Do not treat yourself. Try to avoid being around people who are sick. This medication may increase your risk to bruise or bleed. Call your care team if you notice any unusual bleeding. Be careful brushing or flossing your teeth or using a toothpick because you may get an infection or bleed more easily. If you have any dental work done, tell your dentist you are receiving this medication. Talk to your care team if you or your partner may be pregnant. Serious birth defects can occur if you take this medication during pregnancy and for 6 months after the last dose. You will need a negative pregnancy test before starting this medication. Contraception is recommended while taking this medication and for 6 months after the last dose. Your care team can help you find the option that works for you. If your partner can get pregnant, use a condom during sex while taking this medication and for 3 months after the last dose. Do not breastfeed while taking this medication and for 2 weeks after the last dose. This medication may cause infertility. Talk  to your care team if you are concerned about your fertility. What side effects may I notice from receiving this medication? Side effects that you should report to your care team as soon as possible: Allergic reactions--skin rash, itching, hives, swelling of the face, lips, tongue, or throat Dry cough, shortness of breath or trouble breathing Infection--fever, chills, cough, sore throat, wounds that don't heal, pain or trouble when passing urine, general feeling of discomfort or being unwell Low red blood cell level--unusual weakness or fatigue, dizziness, headache, trouble breathing Pain, tingling, or numbness in the hands or feet Stomach pain, unusual weakness or fatigue, nausea, vomiting, diarrhea, or  fever that lasts longer than expected Unusual bruising or bleeding Side effects that usually do not require medical attention (report to your care team if they continue or are bothersome): Diarrhea Fatigue Hair loss Loss of appetite Nausea Vomiting This list may not describe all possible side effects. Call your doctor for medical advice about side effects. You may report side effects to FDA at 1-800-FDA-1088. Where should I keep my medication? This medication is given in a hospital or clinic. It will not be stored at home. NOTE: This sheet is a summary. It may not cover all possible information. If you have questions about this medicine, talk to your doctor, pharmacist, or health care provider.  2023 Elsevier/Gold Standard (2022-01-27 00:00:00)  Gemcitabine Injection What is this medication? GEMCITABINE (jem SYE ta been) treats some types of cancer. It works by slowing down the growth of cancer cells. This medicine may be used for other purposes; ask your health care provider or pharmacist if you have questions. COMMON BRAND NAME(S): Gemzar, Infugem What should I tell my care team before I take this medication? They need to know if you have any of these conditions: Blood disorders Infection Kidney disease Liver disease Lung or breathing disease, such as asthma or COPD Recent or ongoing radiation therapy An unusual or allergic reaction to gemcitabine, other medications, foods, dyes, or preservatives If you or your partner are pregnant or trying to get pregnant Breast-feeding How should I use this medication? This medication is injected into a vein. It is given by your care team in a hospital or clinic setting. Talk to your care team about the use of this medication in children. Special care may be needed. Overdosage: If you think you have taken too much of this medicine contact a poison control center or emergency room at once. NOTE: This medicine is only for you. Do not share this  medicine with others. What if I miss a dose? Keep appointments for follow-up doses. It is important not to miss your dose. Call your care team if you are unable to keep an appointment. What may interact with this medication? Interactions have not been studied. This list may not describe all possible interactions. Give your health care provider a list of all the medicines, herbs, non-prescription drugs, or dietary supplements you use. Also tell them if you smoke, drink alcohol, or use illegal drugs. Some items may interact with your medicine. What should I watch for while using this medication? Your condition will be monitored carefully while you are receiving this medication. This medication may make you feel generally unwell. This is not uncommon, as chemotherapy can affect healthy cells as well as cancer cells. Report any side effects. Continue your course of treatment even though you feel ill unless your care team tells you to stop. In some cases, you may be given additional medications to help  with side effects. Follow all directions for their use. This medication may increase your risk of getting an infection. Call your care team for advice if you get a fever, chills, sore throat, or other symptoms of a cold or flu. Do not treat yourself. Try to avoid being around people who are sick. This medication may increase your risk to bruise or bleed. Call your care team if you notice any unusual bleeding. Be careful brushing or flossing your teeth or using a toothpick because you may get an infection or bleed more easily. If you have any dental work done, tell your dentist you are receiving this medication. Avoid taking medications that contain aspirin, acetaminophen, ibuprofen, naproxen, or ketoprofen unless instructed by your care team. These medications may hide a fever. Talk to your care team if you or your partner wish to become pregnant or think you might be pregnant. This medication can cause  serious birth defects if taken during pregnancy and for 6 months after the last dose. A negative pregnancy test is required before starting this medication. A reliable form of contraception is recommended while taking this medication and for 6 months after the last dose. Talk to your care team about effective forms of contraception. Do not father a child while taking this medication and for 3 months after the last dose. Use a condom while having sex during this time period. Do not breastfeed while taking this medication and for at least 1 week after the last dose. This medication may cause infertility. Talk to your care team if you are concerned about your fertility. What side effects may I notice from receiving this medication? Side effects that you should report to your care team as soon as possible: Allergic reactions--skin rash, itching, hives, swelling of the face, lips, tongue, or throat Capillary leak syndrome--stomach or muscle pain, unusual weakness or fatigue, feeling faint or lightheaded, decrease in the amount of urine, swelling of the ankles, hands, or feet, trouble breathing Infection--fever, chills, cough, sore throat, wounds that don't heal, pain or trouble when passing urine, general feeling of discomfort or being unwell Liver injury--right upper belly pain, loss of appetite, nausea, light-colored stool, dark yellow or brown urine, yellowing skin or eyes, unusual weakness or fatigue Low red blood cell level--unusual weakness or fatigue, dizziness, headache, trouble breathing Lung injury--shortness of breath or trouble breathing, cough, spitting up blood, chest pain, fever Stomach pain, bloody diarrhea, pale skin, unusual weakness or fatigue, decrease in the amount of urine, which may be signs of hemolytic uremic syndrome Sudden and severe headache, confusion, change in vision, seizures, which may be signs of posterior reversible encephalopathy syndrome (PRES) Unusual bruising or  bleeding Side effects that usually do not require medical attention (report to your care team if they continue or are bothersome): Diarrhea Drowsiness Hair loss Nausea Pain, redness, or swelling with sores inside the mouth or throat Vomiting This list may not describe all possible side effects. Call your doctor for medical advice about side effects. You may report side effects to FDA at 1-800-FDA-1088. Where should I keep my medication? This medication is given in a hospital or clinic. It will not be stored at home. NOTE: This sheet is a summary. It may not cover all possible information. If you have questions about this medicine, talk to your doctor, pharmacist, or health care provider.  2023 Elsevier/Gold Standard (2022-01-27 00:00:00)

## 2022-08-03 NOTE — Progress Notes (Signed)
Patient seen by Dr. Sherrill today ? ?Vitals are within treatment parameters. ? ?Labs reviewed by Dr. Sherrill and are within treatment parameters. ? ?Per physician team, patient is ready for treatment and there are NO modifications to the treatment plan.  ?

## 2022-08-04 LAB — CANCER ANTIGEN 19-9: CA 19-9: 141 U/mL — ABNORMAL HIGH (ref 0–35)

## 2022-08-09 ENCOUNTER — Other Ambulatory Visit (HOSPITAL_BASED_OUTPATIENT_CLINIC_OR_DEPARTMENT_OTHER): Payer: Self-pay

## 2022-08-10 ENCOUNTER — Other Ambulatory Visit (HOSPITAL_BASED_OUTPATIENT_CLINIC_OR_DEPARTMENT_OTHER): Payer: Self-pay

## 2022-08-11 ENCOUNTER — Other Ambulatory Visit: Payer: Self-pay | Admitting: Pharmacist

## 2022-08-11 NOTE — Patient Instructions (Signed)
Ms. Hai,   It was great talking to you today!  Let's continue glipizide 10 mg twice daily, continue holding Lantus for now.   Call me with any questions.   Catie Hedwig Morton, PharmD, [facility] [phone]

## 2022-08-11 NOTE — Progress Notes (Signed)
08/11/2022 Name: Sarah Davies MRN: [id number] DOB: November 07, 1940  Chief Complaint  Patient presents with   Diabetes    Sarah Davies is a 81 y.o. year old female who presented for a telephone visit.   They were referred to the pharmacist by their PCP for assistance in managing diabetes.   Subjective:  Care Team: Primary Care Provider: Fenton Foy, NP ; Next Scheduled Visit: 10/18/22  Medication Access/Adherence  Current Pharmacy:  [facility] [place] [address] Phone: [phone] Fax: [phone]  Patient reports affordability concerns with their medications: No  Patient reports access/transportation concerns to their pharmacy: No  Patient reports adherence concerns with their medications:  No    Diabetes:  Current medications: glipizide 10 mg twice daily - though reports she has been taking 2 tablets twice daily; Lantus 20 units - though says she has not taken until post prandial unless fastings;   Current glucose readings: reports in the past week, fastings have been 70-110; post prandial occasionally up to 270  Using [employer] 2 CGM but not connected to [facility]. Reports she is almost due for refills- she receive a letter from [facility] that they are no longer contracted with her insurance, but it did not say who would be.   Reports she is planning on moving to [place] to be closer to her kids; moving into [place] facility. Just placed their home on the market here.    Patient denies hypoglycemic s/sx including dizziness, shakiness, sweating.   Health Maintenance  Health Maintenance Due  Topic Date Due   OPHTHALMOLOGY EXAM  Never done   Diabetic kidney evaluation - Urine ACR  Never done   TETANUS/TDAP  Never done   Zoster Vaccines- Shingrix (1 of 2) Never done   DEXA SCAN  Never done   Pneumonia Vaccine 31+ Years old (2 - PPSV23 or PCV20) 07/27/2017   Medicare  Annual Wellness (AWV)  11/04/2018   COVID-19 Vaccine (3 - Pfizer risk series) 01/30/2020     Objective: Lab Results  Component Value Date   HGBA1C 10.0 (A) 07/15/2022   HGBA1C 10.0 07/15/2022   HGBA1C 10.0 (A) 07/15/2022   HGBA1C 10.0 (A) 07/15/2022    Lab Results  Component Value Date   CREATININE 0.59 08/03/2022   BUN 16 08/03/2022   NA 138 08/03/2022   K 3.9 08/03/2022   CL 101 08/03/2022   CO2 28 08/03/2022    No results found for: "CHOL", "HDL", "LDLCALC", "LDLDIRECT", "TRIG", "CHOLHDL"  Medications Reviewed Today     Reviewed by Ladell Pier, MD (Physician) on 08/03/22 at [place] List Status: <None>   Medication Order Taking? Sig Documenting Provider Last Dose Status Informant  ACCU-CHEK GUIDE test strip [id number] Yes PLEASE SEE ATTACHED FOR DETAILED DIRECTIONS [provider] Taking Active Self, Pharmacy Records  Accu-Chek Softclix Lancets lancets [id number] Yes 4 (four) times daily. [provider] Taking Active Self, Pharmacy Records  acetaminophen (TYLENOL) 500 MG tablet [id number] Yes Take 1,000 mg by mouth every 6 (six) hours as needed for moderate pain. [provider] Taking Active Self, Pharmacy Records  b complex vitamins tablet [id number] Yes Take 1 tablet by mouth daily. [provider] Taking Active Self, Pharmacy Records  blood glucose meter kit and supplies [id number] Yes Dispense based on patient and insurance preference. Use up to four times daily as directed. (FOR ICD-10 E10.9, E11.9). Elmarie Shiley, MD Taking Active Self, Pharmacy Records  calcium carbonate (OS-CAL) 600 MG TABS [id number] Yes Take 600 mg by mouth 2 (two) times daily with a meal. [provider] Taking Active Self, Pharmacy Records  carvedilol (COREG) 25 MG tablet [id number] Yes Take 2 tablets (50 mg total) by mouth 2 (two) times daily with a meal. Fenton Foy, NP Taking Active   cholecalciferol (VITAMIN D) 1000 UNITS tablet [id number] Yes  Take 1,000 Units by mouth daily. [provider] Taking Active Self, Pharmacy Records  Continuous Blood Gluc Receiver (FREESTYLE LIBRE 2 READER) [place] [id number] Yes Use to check blood sugar continuously - sent to DME supplier [facility] [phone] Fenton Foy, NP Taking Active   Continuous Blood Gluc Sensor (FREESTYLE LIBRE 2 SENSOR) [place] [id number] Yes Use to check blood sugar continuously - sent to DME supplier [facility] [phone] Fenton Foy, NP Taking Active   doxazosin (CARDURA) 4 MG tablet [id number] Yes Take 4 mg by mouth at bedtime.  [provider] Taking Active Self, Pharmacy Records  furosemide (LASIX) 20 MG tablet [id number] Yes Take 20 mg by mouth daily.  [provider] Taking Active Self, Pharmacy Records  glipiZIDE (GLUCOTROL) 10 MG tablet [id number] Yes Take 1 tablet (10 mg total) by mouth 2 (two) times daily with a meal. Fenton Foy, NP Taking Active   insulin glargine (LANTUS) 100 UNIT/ML Solostar Pen [id number] No Inject 20 Units into the skin daily.  Patient not taking: Reported on 08/03/2022   Fenton Foy, NP Not Taking Active   Insulin Pen Needle (PEN NEEDLES 3/16") 31G X 5 MM MISC [id number] Yes Use as directed once daily. Fenton Foy, NP Taking Active   lipase/protease/amylase (CREON) 36000 UNITS CPEP capsule [id number] Yes Take 1 capsule (36,000 Units total) by mouth 3 (three) times daily before meals. Owens Shark, NP Taking Active Pharmacy Records  lisinopril (ZESTRIL) 20 MG tablet [id number] Yes Take 20 mg by mouth daily. [provider] Taking Active Self, Pharmacy Records  Magnesium 500 MG TABS [id number] Yes Take 500 mg by mouth daily. [provider] Taking Active Self, Pharmacy Records  potassium chloride SA (KLOR-CON M) 20 MEQ tablet [id number] Yes Take 1 tablet (20 mEq total) by mouth daily. Ladell Pier, MD Taking Active   prochlorperazine (COMPAZINE) 10 MG tablet  [id number]     Active   traMADol (ULTRAM) 50 MG tablet [id number] Yes Take 1 tablet (50 mg total) by mouth every 6 (six) hours as needed for moderate pain. Owens Shark, NP Taking Active               Assessment/Plan:   Diabetes: - Currently uncontrolled but with more relaxed goals due to disease prognosis.  - Recommend to continue current regimen of glipizide 10 mg twice daily while holding Lantus given low fasting readings.  - [facility] system to place refill request for [place] 2 sensors. Will coordinate with DME providers to determine who is in network with her insurance.   Follow Up Plan: phone call in 2 weeks  Catie TJodi Mourning, PharmD, [facility] [phone]

## 2022-08-17 ENCOUNTER — Encounter: Payer: Self-pay | Admitting: *Deleted

## 2022-08-17 ENCOUNTER — Inpatient Hospital Stay: Payer: Medicare PPO | Attending: Nurse Practitioner

## 2022-08-17 ENCOUNTER — Inpatient Hospital Stay: Payer: Medicare PPO | Admitting: Oncology

## 2022-08-17 ENCOUNTER — Other Ambulatory Visit (HOSPITAL_BASED_OUTPATIENT_CLINIC_OR_DEPARTMENT_OTHER): Payer: Self-pay

## 2022-08-17 ENCOUNTER — Inpatient Hospital Stay: Payer: Medicare PPO

## 2022-08-17 VITALS — BP 172/61 | HR 57

## 2022-08-17 DIAGNOSIS — Z5111 Encounter for antineoplastic chemotherapy: Secondary | ICD-10-CM | POA: Insufficient documentation

## 2022-08-17 DIAGNOSIS — R7401 Elevation of levels of liver transaminase levels: Secondary | ICD-10-CM | POA: Insufficient documentation

## 2022-08-17 DIAGNOSIS — I10 Essential (primary) hypertension: Secondary | ICD-10-CM | POA: Insufficient documentation

## 2022-08-17 DIAGNOSIS — E785 Hyperlipidemia, unspecified: Secondary | ICD-10-CM | POA: Insufficient documentation

## 2022-08-17 DIAGNOSIS — C250 Malignant neoplasm of head of pancreas: Secondary | ICD-10-CM | POA: Diagnosis not present

## 2022-08-17 DIAGNOSIS — E1140 Type 2 diabetes mellitus with diabetic neuropathy, unspecified: Secondary | ICD-10-CM | POA: Insufficient documentation

## 2022-08-17 LAB — CBC WITH DIFFERENTIAL (CANCER CENTER ONLY)
Abs Immature Granulocytes: 0.01 10*3/uL (ref 0.00–0.07)
Basophils Absolute: 0 10*3/uL (ref 0.0–0.1)
Basophils Relative: 1 %
Eosinophils Absolute: 0.2 10*3/uL (ref 0.0–0.5)
Eosinophils Relative: 4 %
HCT: 31.8 % — ABNORMAL LOW (ref 36.0–46.0)
Hemoglobin: 10.1 g/dL — ABNORMAL LOW (ref 12.0–15.0)
Immature Granulocytes: 0 %
Lymphocytes Relative: 19 %
Lymphs Abs: 1.2 10*3/uL (ref 0.7–4.0)
MCH: 28.1 pg (ref 26.0–34.0)
MCHC: 31.8 g/dL (ref 30.0–36.0)
MCV: 88.6 fL (ref 80.0–100.0)
Monocytes Absolute: 0.9 10*3/uL (ref 0.1–1.0)
Monocytes Relative: 15 %
Neutro Abs: 3.7 10*3/uL (ref 1.7–7.7)
Neutrophils Relative %: 61 %
Platelet Count: 153 10*3/uL (ref 150–400)
RBC: 3.59 MIL/uL — ABNORMAL LOW (ref 3.87–5.11)
RDW: 15.9 % — ABNORMAL HIGH (ref 11.5–15.5)
WBC Count: 6 10*3/uL (ref 4.0–10.5)
nRBC: 0 % (ref 0.0–0.2)

## 2022-08-17 LAB — CMP (CANCER CENTER ONLY)
ALT: 45 U/L — ABNORMAL HIGH (ref 0–44)
AST: 47 U/L — ABNORMAL HIGH (ref 15–41)
Albumin: 3.4 g/dL — ABNORMAL LOW (ref 3.5–5.0)
Alkaline Phosphatase: 83 U/L (ref 38–126)
Anion gap: 6 (ref 5–15)
BUN: 11 mg/dL (ref 8–23)
CO2: 29 mmol/L (ref 22–32)
Calcium: 9.3 mg/dL (ref 8.9–10.3)
Chloride: 103 mmol/L (ref 98–111)
Creatinine: 0.62 mg/dL (ref 0.44–1.00)
GFR, Estimated: >60 mL/min (ref >60)
Glucose, Bld: 156 mg/dL — ABNORMAL HIGH (ref 70–99)
Potassium: 3.9 mmol/L (ref 3.5–5.1)
Sodium: 138 mmol/L (ref 135–145)
Total Bilirubin: 0.4 mg/dL (ref 0.3–1.2)
Total Protein: 6.9 g/dL (ref 6.5–8.1)

## 2022-08-17 MED ORDER — PACLITAXEL PROTEIN-BOUND CHEMO INJECTION 100 MG
80.0000 mg/m2 | Freq: Once | INTRAVENOUS | Status: AC
  Administered 2022-08-17: 175 mg via INTRAVENOUS
  Filled 2022-08-17: qty 35

## 2022-08-17 MED ORDER — SODIUM CHLORIDE 0.9% FLUSH
10.0000 mL | INTRAVENOUS | Status: DC | PRN
  Administered 2022-08-17: 10 mL

## 2022-08-17 MED ORDER — PROCHLORPERAZINE MALEATE 10 MG PO TABS
10.0000 mg | ORAL_TABLET | Freq: Once | ORAL | Status: AC
  Administered 2022-08-17: 10 mg via ORAL
  Filled 2022-08-17: qty 1

## 2022-08-17 MED ORDER — SODIUM CHLORIDE 0.9 % IV SOLN
800.0000 mg/m2 | Freq: Once | INTRAVENOUS | Status: AC
  Administered 2022-08-17: 1748 mg via INTRAVENOUS
  Filled 2022-08-17: qty 26.3

## 2022-08-17 MED ORDER — FLUCONAZOLE 100 MG PO TABS
100.0000 mg | ORAL_TABLET | Freq: Every day | ORAL | 0 refills | Status: DC
  Filled 2022-08-17: qty 5, 5d supply, fill #0

## 2022-08-17 MED ORDER — SODIUM CHLORIDE 0.9 % IV SOLN: Freq: Once | INTRAVENOUS | Status: AC

## 2022-08-17 MED ORDER — HEPARIN SOD (PORK) LOCK FLUSH 100 UNIT/ML IV SOLN
500.0000 [IU] | Freq: Once | INTRAVENOUS | Status: AC | PRN
  Administered 2022-08-17: 500 [IU]

## 2022-08-17 NOTE — Progress Notes (Signed)
Formatting of this note is different from the original.  Images from the original note were not included.    [facility]  OFFICE PROGRESS NOTE    Diagnosis: Pancreas cancer    INTERVAL HISTORY:     Lynn Blackwell completed another cycle of gemcitabine/Abraxane on 08/03/2022.  No nausea, fever, or rash.  No change in neuropathy symptoms.  She has intermittent abdominal pain.  She takes tramadol occasionally.    Objective:    Vital signs in last 24 hours:    Blood pressure (!) 145/73, pulse 69, temperature 98.1 F (36.7 C), temperature source Oral, resp. rate 18, height 5' 6 (1.676 m), weight 214 lb 3.2 oz (97.2 kg), SpO2 100 %.      HEENT: Mild thrush at the buccal mucosa and tongue  Resp: Lungs clear bilaterally  Cardio: Regular rate and rhythm  GI: Mild tenderness in the right mid abdomen, no hepatosplenomegaly, no mass, no apparent ascites  Vascular: Trace lower leg edema bilaterally     Portacath/PICC-without erythema    Lab Results:    Lab Results   Component Value Date    WBC 6.0 08/17/2022    HGB 10.1 (L) 08/17/2022    HCT 31.8 (L) 08/17/2022    MCV 88.6 08/17/2022    PLT 153 08/17/2022    NEUTROABS 3.7 08/17/2022     CMP   Lab Results   Component Value Date    NA 138 08/17/2022    K 3.9 08/17/2022    CL 103 08/17/2022    CO2 29 08/17/2022    GLUCOSE 156 (H) 08/17/2022    BUN 11 08/17/2022    CREATININE 0.62 08/17/2022    CALCIUM 9.3 08/17/2022    PROT 6.9 08/17/2022    ALBUMIN 3.4 (L) 08/17/2022    AST 47 (H) 08/17/2022    ALT 45 (H) 08/17/2022    ALKPHOS 83 08/17/2022    BILITOT 0.4 08/17/2022    GFRNONAA >60 08/17/2022     Lab Results   Component Value Date    ZOX096 141 (H) 08/03/2022     Medications: I have reviewed the patient's current medications.    Assessment/Plan:    Pancreas cancer  03/22/2022 CT abdomen/pelvis-ill-defined 3.6 x 2.5 cm hypodense mass in the uncinate process of the pancreas; possible associated acute pancreatitis; fatty liver; faint hypodense focus in the inferior right  lobe of the liver too small to characterize; no intrahepatic biliary dilatation.    03/23/2022 MRI-hypoenhancing mass in the uncinate process/inferior head of the pancreas abutting the SMV with distortion of the SMV, less than 180 degree involvement by MR but with limited assessment; pancreatic duct narrowing/obstruction and early involvement of the distal common bile duct; subtle hepatic lesion in the right hepatic lobe; hazy mesentery may reflect concomitant mesenteritis or lymphatic congestion; small lymph nodes adjacent to the masslike area in the head of the pancreas.  03/26/2022 upper EUS-mass identified in the pancreatic head and uncinate process, appearance consistent with adenocarcinoma.  FNA pancreas head showed malignant cells consistent with adenocarcinoma.  04/07/2022 CT abdomen/pelvis pancreatic protocol-adenocarcinoma within the pancreatic uncinate process, isolated right hepatic lobe lesion suspicious for metastatic disease, contact of the SMV by tumor over an approximately 180 degree span with no evidence of occlusion and no arterial involvement, nonspecific borderline enlarged portacaval lymph node, small bowel mesenteric nodes most likely related to mesenteric adenitis/panniculitis  Cycle 1 gemcitabine/Abraxane 04/27/2022  Cycle 2 gemcitabine/Abraxane 05/11/2022  Cycle 3 gemcitabine/Abraxane 05/25/2022  Cycle 4 gemcitabine/Abraxane 06/08/2022  Cycle 5 gemcitabine/Abraxane 06/21/2022, gemcitabine and Abraxane dose reduced due to thrombocytopenia  Cycle 6 gemcitabine/Abraxane 07/05/2022  07/13/2022 CTs-reduced size of pancreatic head mass.  Several new hypodense lesions in the liver.  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  Fever 07/09/2022 after chemotherapy-evaluated in emergency department, cultures and chest x-ray negative; fever potentially related to Gemcitabine      Disposition:  Lynn Blackwell appears stable.  She continues to tolerate the chemotherapy well.  The CA 19-9 was lower again 2 weeks ago.  We will follow-up on the CA 19-9 from today.  She will complete another cycle of gemcitabine/Abraxane today.  Lynn Blackwell will return for an office visit and chemotherapy on 09/07/2022.  She plans to relocate to [place] in December.  Review of the 2023 CT images at the GI tumor conference does not clearly indicate disease progression in the liver.  At least one of the lesions mentioned in the restaging CT report does not have the appearance of a metastasis.    The plan is to complete a CT or MRI in December.    Thornton Papas, MD    08/17/2022   11:19 AM      Electronically signed by Ladene Artist, MD at 08/17/2022 11:44 AM EST

## 2022-08-17 NOTE — Patient Instructions (Signed)
[facility]   Discharge Instructions: Thank you for choosing [facility] to provide your oncology and hematology care.   If you have a lab appointment with the [facility], please go directly to the [facility] and check in at the registration area.   Wear comfortable clothing and clothing appropriate for easy access to any Portacath or PICC line.   We strive to give you quality time with your provider. You may need to reschedule your appointment if you arrive late (15 or more minutes).  Arriving late affects you and other patients whose appointments are after yours.  Also, if you miss three or more appointments without notifying the office, you may be dismissed from the clinic at the provider's discretion.      For prescription refill requests, have your pharmacy contact our office and allow 72 hours for refills to be completed.    Today you received the following chemotherapy and/or immunotherapy agents Paclitaxel protein bound (ABRAXANE) & Gemcitabine (GEMZAR).      To help prevent nausea and vomiting after your treatment, we encourage you to take your nausea medication as directed.  BELOW ARE SYMPTOMS THAT SHOULD BE REPORTED IMMEDIATELY: *FEVER GREATER THAN 100.4 F (38 C) OR HIGHER *CHILLS OR SWEATING *NAUSEA AND VOMITING THAT IS NOT CONTROLLED WITH YOUR NAUSEA MEDICATION *UNUSUAL SHORTNESS OF BREATH *UNUSUAL BRUISING OR BLEEDING *URINARY PROBLEMS (pain or burning when urinating, or frequent urination) *BOWEL PROBLEMS (unusual diarrhea, constipation, pain near the anus) TENDERNESS IN MOUTH AND THROAT WITH OR WITHOUT PRESENCE OF ULCERS (sore throat, sores in mouth, or a toothache) UNUSUAL RASH, SWELLING OR PAIN  UNUSUAL VAGINAL DISCHARGE OR ITCHING   Items with * indicate a potential emergency and should be followed up as soon as possible or go to the Emergency Department if any problems should occur.  Please show the CHEMOTHERAPY ALERT  CARD or IMMUNOTHERAPY ALERT CARD at check-in to the Emergency Department and triage nurse.  Should you have questions after your visit or need to cancel or reschedule your appointment, please contact [facility]  Dept: [phone]  and follow the prompts.  Office hours are 8:00 a.m. to 4:30 p.m. Monday - Friday. Please note that voicemails left after 4:00 p.m. may not be returned until the following business day.  We are closed weekends and major holidays. You have access to a nurse at all times for urgent questions. Please call the main number to the clinic Dept: [phone] and follow the prompts.   For any non-urgent questions, you may also contact your provider using MyChart. We now offer e-Visits for anyone 6 and older to request care online for non-urgent symptoms. For details visit [email or web address].   Also download the MyChart app! Go to the app store, search "MyChart", open the app, select [facility], and log in with your MyChart username and password.  Masks are optional in the cancer centers. If you would like for your care team to wear a mask while they are taking care of you, please let them know. You may have one support person who is at least 80 years old accompany you for your appointments.  Paclitaxel Nanoparticle Albumin-Bound Injection What is this medication? NANOPARTICLE ALBUMIN-BOUND PACLITAXEL (Na no PAHR ti kuhl al BYOO muhn-bound PAK li TAX el) treats some types of cancer. It works by slowing down the growth of cancer cells. This medicine may be used for other purposes; ask your health care provider or pharmacist if  you have questions. COMMON BRAND NAME(S): Abraxane What should I tell my care team before I take this medication? They need to know if you have any of these conditions: Liver disease Low white blood cell levels An unusual or allergic reaction to paclitaxel, albumin, other medications, foods, dyes, or preservatives If you  or your partner are pregnant or trying to get pregnant Breast-feeding How should I use this medication? This medication is injected into a vein. It is given by your care team in a hospital or clinic setting. Talk to your care team about the use of this medication in children. Special care may be needed. Overdosage: If you think you have taken too much of this medicine contact a poison control center or emergency room at once. NOTE: This medicine is only for you. Do not share this medicine with others. What if I miss a dose? Keep appointments for follow-up doses. It is important not to miss your dose. Call your care team if you are unable to keep an appointment. What may interact with this medication? Other medications may affect the way this medication works. Talk with your care team about all of the medications you take. They may suggest changes to your treatment plan to lower the risk of side effects and to make sure your medications work as intended. This list may not describe all possible interactions. Give your health care provider a list of all the medicines, herbs, non-prescription drugs, or dietary supplements you use. Also tell them if you smoke, drink alcohol, or use illegal drugs. Some items may interact with your medicine. What should I watch for while using this medication? Your condition will be monitored carefully while you are receiving this medication. You may need blood work while taking this medication. This medication may make you feel generally unwell. This is not uncommon as chemotherapy can affect healthy cells as well as cancer cells. Report any side effects. Continue your course of treatment even though you feel ill unless your care team tells you to stop. This medication can cause serious allergic reactions. To reduce the risk, your care team may give you other medications to take before receiving this one. Be sure to follow the directions from your care team. This  medication may increase your risk of getting an infection. Call your care team for advice if you get a fever, chills, sore throat, or other symptoms of a cold or flu. Do not treat yourself. Try to avoid being around people who are sick. This medication may increase your risk to bruise or bleed. Call your care team if you notice any unusual bleeding. Be careful brushing or flossing your teeth or using a toothpick because you may get an infection or bleed more easily. If you have any dental work done, tell your dentist you are receiving this medication. Talk to your care team if you or your partner may be pregnant. Serious birth defects can occur if you take this medication during pregnancy and for 6 months after the last dose. You will need a negative pregnancy test before starting this medication. Contraception is recommended while taking this medication and for 6 months after the last dose. Your care team can help you find the option that works for you. If your partner can get pregnant, use a condom during sex while taking this medication and for 3 months after the last dose. Do not breastfeed while taking this medication and for 2 weeks after the last dose. This medication may cause infertility.  Talk to your care team if you are concerned about your fertility. What side effects may I notice from receiving this medication? Side effects that you should report to your care team as soon as possible: Allergic reactions--skin rash, itching, hives, swelling of the face, lips, tongue, or throat Dry cough, shortness of breath or trouble breathing Infection--fever, chills, cough, sore throat, wounds that don't heal, pain or trouble when passing urine, general feeling of discomfort or being unwell Low red blood cell level--unusual weakness or fatigue, dizziness, headache, trouble breathing Pain, tingling, or numbness in the hands or feet Stomach pain, unusual weakness or fatigue, nausea, vomiting, diarrhea, or  fever that lasts longer than expected Unusual bruising or bleeding Side effects that usually do not require medical attention (report to your care team if they continue or are bothersome): Diarrhea Fatigue Hair loss Loss of appetite Nausea Vomiting This list may not describe all possible side effects. Call your doctor for medical advice about side effects. You may report side effects to FDA at 1-800-FDA-1088. Where should I keep my medication? This medication is given in a hospital or clinic. It will not be stored at home. NOTE: This sheet is a summary. It may not cover all possible information. If you have questions about this medicine, talk to your doctor, pharmacist, or health care provider.  2023 Elsevier/Gold Standard (2007-11-18 00:00:00)  Gemcitabine Injection What is this medication? GEMCITABINE (jem SYE ta been) treats some types of cancer. It works by slowing down the growth of cancer cells. This medicine may be used for other purposes; ask your health care provider or pharmacist if you have questions. COMMON BRAND NAME(S): Gemzar, Infugem What should I tell my care team before I take this medication? They need to know if you have any of these conditions: Blood disorders Infection Kidney disease Liver disease Lung or breathing disease, such as asthma or COPD Recent or ongoing radiation therapy An unusual or allergic reaction to gemcitabine, other medications, foods, dyes, or preservatives If you or your partner are pregnant or trying to get pregnant Breast-feeding How should I use this medication? This medication is injected into a vein. It is given by your care team in a hospital or clinic setting. Talk to your care team about the use of this medication in children. Special care may be needed. Overdosage: If you think you have taken too much of this medicine contact a poison control center or emergency room at once. NOTE: This medicine is only for you. Do not share this  medicine with others. What if I miss a dose? Keep appointments for follow-up doses. It is important not to miss your dose. Call your care team if you are unable to keep an appointment. What may interact with this medication? Interactions have not been studied. This list may not describe all possible interactions. Give your health care provider a list of all the medicines, herbs, non-prescription drugs, or dietary supplements you use. Also tell them if you smoke, drink alcohol, or use illegal drugs. Some items may interact with your medicine. What should I watch for while using this medication? Your condition will be monitored carefully while you are receiving this medication. This medication may make you feel generally unwell. This is not uncommon, as chemotherapy can affect healthy cells as well as cancer cells. Report any side effects. Continue your course of treatment even though you feel ill unless your care team tells you to stop. In some cases, you may be given additional medications to  help with side effects. Follow all directions for their use. This medication may increase your risk of getting an infection. Call your care team for advice if you get a fever, chills, sore throat, or other symptoms of a cold or flu. Do not treat yourself. Try to avoid being around people who are sick. This medication may increase your risk to bruise or bleed. Call your care team if you notice any unusual bleeding. Be careful brushing or flossing your teeth or using a toothpick because you may get an infection or bleed more easily. If you have any dental work done, tell your dentist you are receiving this medication. Avoid taking medications that contain aspirin, acetaminophen, ibuprofen, naproxen, or ketoprofen unless instructed by your care team. These medications may hide a fever. Talk to your care team if you or your partner wish to become pregnant or think you might be pregnant. This medication can cause  serious birth defects if taken during pregnancy and for 6 months after the last dose. A negative pregnancy test is required before starting this medication. A reliable form of contraception is recommended while taking this medication and for 6 months after the last dose. Talk to your care team about effective forms of contraception. Do not father a child while taking this medication and for 3 months after the last dose. Use a condom while having sex during this time period. Do not breastfeed while taking this medication and for at least 1 week after the last dose. This medication may cause infertility. Talk to your care team if you are concerned about your fertility. What side effects may I notice from receiving this medication? Side effects that you should report to your care team as soon as possible: Allergic reactions--skin rash, itching, hives, swelling of the face, lips, tongue, or throat Capillary leak syndrome--stomach or muscle pain, unusual weakness or fatigue, feeling faint or lightheaded, decrease in the amount of urine, swelling of the ankles, hands, or feet, trouble breathing Infection--fever, chills, cough, sore throat, wounds that don't heal, pain or trouble when passing urine, general feeling of discomfort or being unwell Liver injury--right upper belly pain, loss of appetite, nausea, light-colored stool, dark yellow or brown urine, yellowing skin or eyes, unusual weakness or fatigue Low red blood cell level--unusual weakness or fatigue, dizziness, headache, trouble breathing Lung injury--shortness of breath or trouble breathing, cough, spitting up blood, chest pain, fever Stomach pain, bloody diarrhea, pale skin, unusual weakness or fatigue, decrease in the amount of urine, which may be signs of hemolytic uremic syndrome Sudden and severe headache, confusion, change in vision, seizures, which may be signs of posterior reversible encephalopathy syndrome (PRES) Unusual bruising or  bleeding Side effects that usually do not require medical attention (report to your care team if they continue or are bothersome): Diarrhea Drowsiness Hair loss Nausea Pain, redness, or swelling with sores inside the mouth or throat Vomiting This list may not describe all possible side effects. Call your doctor for medical advice about side effects. You may report side effects to FDA at 1-800-FDA-1088. Where should I keep my medication? This medication is given in a hospital or clinic. It will not be stored at home. NOTE: This sheet is a summary. It may not cover all possible information. If you have questions about this medicine, talk to your doctor, pharmacist, or health care provider.  2023 Elsevier/Gold Standard (2007-11-18 00:00:00)

## 2022-08-17 NOTE — Progress Notes (Signed)
[place] OFFICE PROGRESS NOTE   Diagnosis: Pancreas cancer  INTERVAL HISTORY:   Ms. Valko completed another cycle of gemcitabine/Abraxane on 08/03/2022.  No nausea, fever, or rash.  No change in neuropathy symptoms.  She has intermittent abdominal pain.  She takes tramadol occasionally.  Objective:  Vital signs in last 24 hours:  Blood pressure (!) 145/73, pulse 69, temperature 98.1 F (36.7 C), temperature source Oral, resp. rate 18, height '5\' 6"'$  (1.676 m), weight 214 lb 3.2 oz (97.2 kg), SpO2 100 %.    HEENT: Mild thrush at the buccal mucosa and tongue Resp: Lungs clear bilaterally Cardio: Regular rate and rhythm GI: Mild tenderness in the right mid abdomen, no hepatosplenomegaly, no mass, no apparent ascites Vascular: Trace lower leg edema bilaterally   Portacath/PICC-without erythema  Lab Results:  Lab Results  Component Value Date   WBC 6.0 08/17/2022   HGB 10.1 (L) 08/17/2022   HCT 31.8 (L) 08/17/2022   MCV 88.6 08/17/2022   PLT 153 08/17/2022   NEUTROABS 3.7 08/17/2022    CMP  Lab Results  Component Value Date   NA 138 08/17/2022   K 3.9 08/17/2022   CL 103 08/17/2022   CO2 29 08/17/2022   GLUCOSE 156 (H) 08/17/2022   BUN 11 08/17/2022   CREATININE 0.62 08/17/2022   CALCIUM 9.3 08/17/2022   PROT 6.9 08/17/2022   ALBUMIN 3.4 (L) 08/17/2022   AST 47 (H) 08/17/2022   ALT 45 (H) 08/17/2022   ALKPHOS 83 08/17/2022   BILITOT 0.4 08/17/2022   GFRNONAA >60 08/17/2022    Lab Results  Component Value Date   FHL456 141 (H) 08/03/2022     Medications: I have reviewed the patient's current medications.   Assessment/Plan:  Pancreas cancer 03/22/2022 CT abdomen/pelvis-ill-defined 3.6 x 2.5 cm hypodense mass in the uncinate process of the pancreas; possible associated acute pancreatitis; fatty liver; faint hypodense focus in the inferior right lobe of the liver too small to characterize; no intrahepatic biliary dilatation.   03/23/2022  MRI-hypoenhancing mass in the uncinate process/inferior head of the pancreas abutting the SMV with distortion of the SMV, less than 180 degree involvement by MR but with limited assessment; pancreatic duct narrowing/obstruction and early involvement of the distal common bile duct; subtle hepatic lesion in the right hepatic lobe; hazy mesentery may reflect concomitant mesenteritis or lymphatic congestion; small lymph nodes adjacent to the masslike area in the head of the pancreas. 03/26/2022 upper EUS-mass identified in the pancreatic head and uncinate process, appearance consistent with adenocarcinoma.  FNA pancreas head showed malignant cells consistent with adenocarcinoma. 04/07/2022 CT abdomen/pelvis pancreatic protocol-adenocarcinoma within the pancreatic uncinate process, isolated right hepatic lobe lesion suspicious for metastatic disease, contact of the SMV by tumor over an approximately 180 degree span with no evidence of occlusion and no arterial involvement, nonspecific borderline enlarged portacaval lymph node, small bowel mesenteric nodes most likely related to mesenteric adenitis/panniculitis Cycle 1 gemcitabine/Abraxane 04/27/2022 Cycle 2 gemcitabine/Abraxane 05/11/2022 Cycle 3 gemcitabine/Abraxane 05/25/2022 Cycle 4 gemcitabine/Abraxane 06/08/2022 Cycle 5 gemcitabine/Abraxane 06/21/2022, gemcitabine and Abraxane dose reduced due to thrombocytopenia Cycle 6 gemcitabine/Abraxane 07/05/2022 07/13/2022 CTs-reduced size of pancreatic head mass.  Several new hypodense lesions in the liver. 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  Fever 07/09/2022 after chemotherapy-evaluated in emergency department, cultures and chest x-ray negative; fever potentially related to Gemcitabine      Disposition: Ms. Peckinpaugh appears stable.  She continues to tolerate the chemotherapy well.  The CA 19-9 was lower again 2 weeks ago.  We will follow-up on the CA 19-9 from today.  She will complete another cycle of gemcitabine/Abraxane today. Ms. Cavendish will return for an office visit and chemotherapy on 09/07/2022.  She plans to relocate to [place] in December.  Review of the 2023 CT images at the GI tumor conference does not clearly indicate disease progression in the liver.  At least one of the lesions mentioned in the restaging CT report does not have the appearance of a metastasis.  The plan is to complete a CT or MRI in December.  Betsy Coder, MD  08/17/2022  11:19 AM

## 2022-08-17 NOTE — Progress Notes (Signed)
Patient seen by Dr. Sherrill today ? ?Vitals are within treatment parameters. ? ?Labs reviewed by Dr. Sherrill and are within treatment parameters. ? ?Per physician team, patient is ready for treatment and there are NO modifications to the treatment plan.  ?

## 2022-08-18 LAB — CANCER ANTIGEN 19-9: CA 19-9: 171 U/mL — ABNORMAL HIGH (ref 0–35)

## 2022-08-25 ENCOUNTER — Other Ambulatory Visit: Payer: Medicare PPO | Admitting: Pharmacist

## 2022-08-25 NOTE — Patient Instructions (Addendum)
It was nice to see you today!  Continue glipizide 10 mg twice daily and Lantus 20 units daily.   If your blood sugar is <70, you may reduce Lantus to 10 units daily until you establish care with your tentative oncologist in [place].   We will miss you! We wish you the best of luck with your move and we hope you have a happy holiday season!   Joseph Art, Pharm.D. PGY-2 Ambulatory Care Pharmacy Resident

## 2022-08-25 NOTE — Progress Notes (Signed)
I have reviewed the resident's documentation and agree with assessment and plan. I was available for questions. Medication change recommendations were communicated with the patient's PCP.   Catie Hedwig Morton, PharmD, [facility] [phone]

## 2022-08-25 NOTE — Chronic Care Management (AMB) (Signed)
08/25/2022 Name: Sarah Davies MRN: [id number] DOB: 1941-07-21  No chief complaint on file.   Sarah Davies is a 81 y.o. year old female who was referred for medication management by their primary care provider, Sarah Foy, NP.    They were referred to the pharmacist by their PCP for assistance in managing diabetes.    Subjective:   Care Team: Primary Care Provider: Fenton Foy, NP ; Next Scheduled Visit: 10/18/22  Medication Access/Adherence  Current Pharmacy:  [facility] [place] [address] Phone: [phone] Fax: [phone]   Patient reports affordability concerns with their medications: No  Patient reports access/transportation concerns to their pharmacy: No  Patient reports adherence concerns with their medications:  No     Diabetes:   Current medications: glipizide 10 mg twice daily (confirmed she appropriately reduced to 1 tab BID from 2 tabs BID); she also restarted Lantus 20 units daily as she had elevated BG after decreasing glipizide (see below)   Current glucose readings: when she reduced glipizide from 20 mg BID to 10 mg BID she started having BG in the high 200s. She self restarted Lantus 20 units daily and readings have been in the 160-low 200s.   She received a letter that [facility] picked up the contract with her insurance and is now getting her CGM through [facility]. She received a refill yesterday.   Reports she is planning on moving to [place] to be closer to her kids. Her house sold in 2 days. Moving to [place] the week after Thanksgiving. She has a tentative oncologist who will also be managing her diabetes.    Patient denies hypoglycemic s/sx including dizziness, shakiness, sweating. No BG <70 since reducing glipizide.    Health Maintenance  Health Maintenance Due  Topic Date Due   OPHTHALMOLOGY EXAM  Never done   Diabetic kidney evaluation - Urine ACR  Never done    TETANUS/TDAP  Never done   Zoster Vaccines- Shingrix (1 of 2) Never done   DEXA SCAN  Never done   Pneumonia Vaccine 65+ Years old (2 - PPSV23 or PCV20) 07/27/2017   Medicare Annual Wellness (AWV)  11/04/2018   COVID-19 Vaccine (3 - Pfizer risk series) 01/30/2020     Objective: Lab Results  Component Value Date   HGBA1C 10.0 (A) 07/15/2022   HGBA1C 10.0 07/15/2022   HGBA1C 10.0 (A) 07/15/2022   HGBA1C 10.0 (A) 07/15/2022    Lab Results  Component Value Date   CREATININE 0.62 08/17/2022   BUN 11 08/17/2022   NA 138 08/17/2022   K 3.9 08/17/2022   CL 103 08/17/2022   CO2 29 08/17/2022    No results found for: "CHOL", "HDL", "LDLCALC", "LDLDIRECT", "TRIG", "CHOLHDL"  Medications Reviewed Today     Reviewed by Ladell Pier, MD (Physician) on 08/17/22 at 1109  Med List Status: <None>   Medication Order Taking? Sig Documenting Provider Last Dose Status Informant  ACCU-CHEK GUIDE test strip [id number] Yes PLEASE SEE ATTACHED FOR DETAILED DIRECTIONS [provider] Taking Active Self, Pharmacy Records  Accu-Chek Softclix Lancets lancets [id number] Yes 4 (four) times daily. [provider] Taking Active Self, Pharmacy Records  acetaminophen (TYLENOL) 500 MG tablet [id number] Yes Take 1,000 mg by mouth every 6 (six) hours as needed for moderate pain. [provider] Taking Active Self, Pharmacy Records  b complex vitamins tablet [id number] Yes Take 1 tablet by mouth daily. [provider] Taking Active Self, Pharmacy  Records  blood glucose meter kit and supplies [id number] Yes Dispense based on patient and insurance preference. Use up to four times daily as directed. (FOR ICD-10 E10.9, E11.9). Elmarie Shiley, MD Taking Active Self, Pharmacy Records  calcium carbonate (OS-CAL) 600 MG TABS [id number] Yes Take 600 mg by mouth 2 (two) times daily with a meal. [provider] Taking Active Self, Pharmacy Records  carvedilol (COREG) 25 MG tablet  [id number] Yes Take 2 tablets (50 mg total) by mouth 2 (two) times daily with a meal. Sarah Foy, NP Taking Active   cholecalciferol (VITAMIN D) 1000 UNITS tablet [id number] Yes Take 1,000 Units by mouth daily. [provider] Taking Active Self, Pharmacy Records    Discontinued 08/17/22 1046   Continuous Blood Gluc Sensor (FREESTYLE LIBRE 2 SENSOR) MISC [id number] Yes Use to check blood sugar continuously - sent to DME supplier [employer] (([phone]) [phone]) Sarah Foy, NP Taking Active   doxazosin (CARDURA) 4 MG tablet [id number] Yes Take 4 mg by mouth at bedtime.  [provider] Taking Active Self, Pharmacy Records  furosemide (LASIX) 20 MG tablet [id number] Yes Take 20 mg by mouth daily.  [provider] Taking Active Self, Pharmacy Records  glipiZIDE (GLUCOTROL) 10 MG tablet [id number] Yes Take 1 tablet (10 mg total) by mouth 2 (two) times daily with a meal. Sarah Foy, NP Taking Active   insulin glargine (LANTUS) 100 UNIT/ML Solostar Pen [id number] Yes Inject 20 Units into the skin daily. Sarah Foy, NP Taking Active   Insulin Pen Needle (PEN NEEDLES 3/16") 31G X 5 MM MISC [id number] Yes Use as directed once daily. Sarah Foy, NP Taking Active   lipase/protease/amylase (CREON) 36000 UNITS CPEP capsule [id number] Yes Take 1 capsule (36,000 Units total) by mouth 3 (three) times daily before meals. Owens Shark, NP Taking Active Pharmacy Records  lisinopril (ZESTRIL) 20 MG tablet [id number] Yes Take 20 mg by mouth daily. [provider] Taking Active Self, Pharmacy Records  Magnesium 500 MG TABS [id number] Yes Take 500 mg by mouth daily. [provider] Taking Active Self, Pharmacy Records  potassium chloride SA (KLOR-CON M) 20 MEQ tablet [id number] Yes Take 1 tablet (20 mEq total) by mouth daily. [place] Pier, MD Taking Active   prochlorperazine (COMPAZINE) 10 MG tablet [id number]     Active   traMADol (ULTRAM) 50 MG tablet  [id number] Yes Take 1 tablet (50 mg total) by mouth every 6 (six) hours as needed for moderate pain. Owens Shark, NP Taking Active              Assessment/Plan:   Diabetes: - Currently uncontrolled but with more relaxed goals due to disease prognosis.  - Recommend to continue current regimen of glipizide 10 mg twice daily and Lantus 20 units daily as patient had BG in 300s after coming off insulin. While she is waiting to establish care in [place], instructed patient that she can reduce to Lantus 10 units daily if she starts to have hypoglycemic events.   Follow Up Plan: PRN until patient moves to [facility], [place].D. PGY-2 Ambulatory Care Pharmacy Resident 08/25/2022 10:23 AM

## 2022-08-26 ENCOUNTER — Other Ambulatory Visit (HOSPITAL_BASED_OUTPATIENT_CLINIC_OR_DEPARTMENT_OTHER): Payer: Self-pay

## 2022-09-01 ENCOUNTER — Ambulatory Visit: Payer: Medicare PPO | Admitting: Podiatry

## 2022-09-04 ENCOUNTER — Other Ambulatory Visit: Payer: Self-pay | Admitting: Oncology

## 2022-09-07 ENCOUNTER — Other Ambulatory Visit (HOSPITAL_BASED_OUTPATIENT_CLINIC_OR_DEPARTMENT_OTHER): Payer: Self-pay

## 2022-09-07 ENCOUNTER — Encounter: Payer: Self-pay | Admitting: Nurse Practitioner

## 2022-09-07 ENCOUNTER — Inpatient Hospital Stay: Payer: Medicare PPO

## 2022-09-07 ENCOUNTER — Telehealth: Payer: Self-pay

## 2022-09-07 ENCOUNTER — Other Ambulatory Visit: Payer: Self-pay

## 2022-09-07 ENCOUNTER — Encounter: Payer: Self-pay | Admitting: Oncology

## 2022-09-07 ENCOUNTER — Inpatient Hospital Stay: Payer: Medicare PPO | Admitting: Nurse Practitioner

## 2022-09-07 ENCOUNTER — Other Ambulatory Visit: Payer: Self-pay | Admitting: Oncology

## 2022-09-07 DIAGNOSIS — C250 Malignant neoplasm of head of pancreas: Secondary | ICD-10-CM

## 2022-09-07 DIAGNOSIS — Z5111 Encounter for antineoplastic chemotherapy: Secondary | ICD-10-CM | POA: Diagnosis not present

## 2022-09-07 DIAGNOSIS — Z95828 Presence of other vascular implants and grafts: Secondary | ICD-10-CM

## 2022-09-07 LAB — CMP (CANCER CENTER ONLY)
ALT: 147 U/L — ABNORMAL HIGH (ref 0–44)
AST: 161 U/L — ABNORMAL HIGH (ref 15–41)
Albumin: 3.5 g/dL (ref 3.5–5.0)
Alkaline Phosphatase: 339 U/L — ABNORMAL HIGH (ref 38–126)
Anion gap: 8 (ref 5–15)
BUN: 16 mg/dL (ref 8–23)
CO2: 29 mmol/L (ref 22–32)
Calcium: 9.3 mg/dL (ref 8.9–10.3)
Chloride: 99 mmol/L (ref 98–111)
Creatinine: 0.68 mg/dL (ref 0.44–1.00)
GFR, Estimated: >60 mL/min (ref >60)
Glucose, Bld: 302 mg/dL — ABNORMAL HIGH (ref 70–99)
Potassium: 4.2 mmol/L (ref 3.5–5.1)
Sodium: 136 mmol/L (ref 135–145)
Total Bilirubin: 0.5 mg/dL (ref 0.3–1.2)
Total Protein: 7.4 g/dL (ref 6.5–8.1)

## 2022-09-07 LAB — CBC WITH DIFFERENTIAL (CANCER CENTER ONLY)
Abs Immature Granulocytes: 0.02 10*3/uL (ref 0.00–0.07)
Basophils Absolute: 0.1 10*3/uL (ref 0.0–0.1)
Basophils Relative: 1 %
Eosinophils Absolute: 0.2 10*3/uL (ref 0.0–0.5)
Eosinophils Relative: 2 %
HCT: 34.5 % — ABNORMAL LOW (ref 36.0–46.0)
Hemoglobin: 10.8 g/dL — ABNORMAL LOW (ref 12.0–15.0)
Immature Granulocytes: 0 %
Lymphocytes Relative: 13 %
Lymphs Abs: 1.1 10*3/uL (ref 0.7–4.0)
MCH: 27.6 pg (ref 26.0–34.0)
MCHC: 31.3 g/dL (ref 30.0–36.0)
MCV: 88.2 fL (ref 80.0–100.0)
Monocytes Absolute: 1.1 10*3/uL — ABNORMAL HIGH (ref 0.1–1.0)
Monocytes Relative: 13 %
Neutro Abs: 5.8 10*3/uL (ref 1.7–7.7)
Neutrophils Relative %: 71 %
Platelet Count: 225 10*3/uL (ref 150–400)
RBC: 3.91 MIL/uL (ref 3.87–5.11)
RDW: 16.3 % — ABNORMAL HIGH (ref 11.5–15.5)
WBC Count: 8.3 10*3/uL (ref 4.0–10.5)
nRBC: 0 % (ref 0.0–0.2)

## 2022-09-07 MED ORDER — POTASSIUM CHLORIDE CRYS ER 20 MEQ PO TBCR
20.0000 meq | EXTENDED_RELEASE_TABLET | Freq: Every day | ORAL | 1 refills | Status: DC
  Filled 2022-09-07: qty 30, 30d supply, fill #0

## 2022-09-07 MED ORDER — SODIUM CHLORIDE 0.9% FLUSH
10.0000 mL | INTRAVENOUS | Status: AC | PRN
  Administered 2022-09-07: 10 mL via INTRAVENOUS

## 2022-09-07 MED ORDER — HEPARIN SOD (PORK) LOCK FLUSH 100 UNIT/ML IV SOLN
500.0000 [IU] | Freq: Once | INTRAVENOUS | Status: AC
  Administered 2022-09-07: 500 [IU] via INTRAVENOUS

## 2022-09-07 MED ORDER — PANCRELIPASE (LIP-PROT-AMYL) 36000-114000 UNITS PO CPEP
36000.0000 [IU] | ORAL_CAPSULE | Freq: Three times a day (TID) | ORAL | 1 refills | Status: DC
  Filled 2022-09-07: qty 180, 60d supply, fill #0

## 2022-09-07 NOTE — Progress Notes (Signed)
Formatting of this note is different from the original.  Images from the original note were not included.    [facility]  OFFICE PROGRESS NOTE    Diagnosis: Pancreas cancer    INTERVAL HISTORY:     [name] returns as scheduled.  She completed another cycle of gemcitabine/Abraxane 08/17/2022.  She reports intermittent abdominal discomfort since last office visit/chemotherapy.  No nausea or vomiting.  Bowels are moving.  No mouth sores.  She notes neuropathy symptoms are now constant.  Symptoms do not interfere with activity.  Appetite is poor.  She has lost weight since her last visit.  No fever or chills.    She is relocating to [place], planning to leave tomorrow morning.    Objective:    Vital signs in last 24 hours:    Blood pressure 132/65, pulse 70, temperature 98.2 F (36.8 C), temperature source Oral, resp. rate 18, height 5' 6 (1.676 m), weight 201 lb (91.2 kg), SpO2 99 %.      HEENT: Mild white coating over tongue.  No buccal thrush.  Resp: Lungs clear bilaterally.  Cardio: Regular rate and rhythm.  GI: Abdomen soft and nontender.  No hepatosplenomegaly.  Vascular: No leg edema.  Alert and oriented.  Vibratory sense mildly decreased over the fingertips per tuning fork exam.  Port-A-Cath without erythema.    Lab Results:    Lab Results   Component Value Date    WBC 8.3 09/07/2022    HGB 10.8 (L) 09/07/2022    HCT 34.5 (L) 09/07/2022    MCV 88.2 09/07/2022    PLT 225 09/07/2022    NEUTROABS 5.8 09/07/2022     Imaging:    No results found.    Medications: I have reviewed the patient's current medications.    Assessment/Plan:  Pancreas cancer  03/22/2022 CT abdomen/pelvis-ill-defined 3.6 x 2.5 cm hypodense mass in the uncinate process of the pancreas; possible associated acute pancreatitis; fatty liver; faint hypodense focus in the inferior right lobe of the liver too small to characterize; no intrahepatic biliary dilatation.    03/23/2022 MRI-hypoenhancing mass in the uncinate process/inferior  head of the pancreas abutting the SMV with distortion of the SMV, less than 180 degree involvement by MR but with limited assessment; pancreatic duct narrowing/obstruction and early involvement of the distal common bile duct; subtle hepatic lesion in the right hepatic lobe; hazy mesentery may reflect concomitant mesenteritis or lymphatic congestion; small lymph nodes adjacent to the masslike area in the head of the pancreas.  03/26/2022 upper EUS-mass identified in the pancreatic head and uncinate process, appearance consistent with adenocarcinoma.  FNA pancreas head showed malignant cells consistent with adenocarcinoma.  04/07/2022 CT abdomen/pelvis pancreatic protocol-adenocarcinoma within the pancreatic uncinate process, isolated right hepatic lobe lesion suspicious for metastatic disease, contact of the SMV by tumor over an approximately 180 degree span with no evidence of occlusion and no arterial involvement, nonspecific borderline enlarged portacaval lymph node, small bowel mesenteric nodes most likely related to mesenteric adenitis/panniculitis  Cycle 1 gemcitabine/Abraxane 04/27/2022  Cycle 2 gemcitabine/Abraxane 05/11/2022  Cycle 3 gemcitabine/Abraxane 05/25/2022  Cycle 4 gemcitabine/Abraxane 06/08/2022  Cycle 5 gemcitabine/Abraxane 06/21/2022, gemcitabine and Abraxane dose reduced due to thrombocytopenia  Cycle 6 gemcitabine/Abraxane 07/05/2022  07/13/2022 CTs-reduced size of pancreatic head mass.  Several new hypodense lesions in the liver.  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  Fever 07/09/2022 after chemotherapy-evaluated in emergency department, cultures and chest x-ray negative; fever potentially related to Gemcitabine      Disposition: Lynn Blackwell is on active treatment with gemcitabine/Abraxane.  She completed another cycle on 08/17/2022.  She presents today for routine follow-up prior to chemotherapy and reports she is planning to relocate to [place], leaving tomorrow morning.  She provided [place] with the name of a Cancer Center, [facility], in [place].  We are making an urgent referral.    She is having abdominal discomfort, has lost weight since her last visit.  Review of labs from today shows progressive elevation of transaminases.  We discussed the possibility of stent occlusion.  She also has progressive neuropathy symptoms likely due to Abraxane.  We decided to hold today's treatment.    We recommended if at all possible she remain in [place] until the stent can be evaluated.  Her son is trying to make arrangements for her to stay in town so this can be completed.  They understand to seek emergency evaluation with fever, chills, worsening abdominal pain.  At present there plan is to leave for [place] tomorrow morning.    We are available to see her in the future if she returns to this area.    Patient seen with Dr. Truett Perna.    Lonna Cobb ANP/GNP-BC     09/07/2022   10:41 AM  This was a shared visit with Lonna Cobb.  Lynn Blackwell was scheduled for gemcitabine/Abraxane today.  The liver enzymes are more elevated and she has increased neuropathy symptoms.  She plans to relocate to [place] tomorrow.  We asked her to remain in [place] so the elevated liver enzymes can be further evaluated.  She will need a restaging CT evaluation when she moves to [place].    I am available to see her as needed.    I was present for greater than 50% of today's visit.  I performed medical decision making.    Mancel Bale, MD      Electronically signed by Ladene Artist, MD at 09/07/2022  5:46 PM EST

## 2022-09-07 NOTE — Addendum Note (Signed)
Addended by: Dimitri Ped on: 09/07/2022 11:32 AM     Modules accepted: Orders      Electronically signed by Dimitri Ped, LPN at 16/07/9603 11:32 AM EST

## 2022-09-07 NOTE — Telephone Encounter (Signed)
Formatting of this note might be different from the original.  Referral has been sent to: [facility]. [facility]    [facility]: [phone]  [facility]: [phone]    Referral  Insurance/Demographics  Progress note  Labs  CT scans  Surgical path  Cytology     Confirmation was received.   Electronically signed by Dimitri Ped, LPN at 29/56/2130  4:20 PM EST

## 2022-09-07 NOTE — Addendum Note (Signed)
Addended by: Velna Hatchet on: 09/07/2022 11:32 AM   Modules accepted: Orders

## 2022-09-07 NOTE — Progress Notes (Signed)
[facility] OFFICE PROGRESS NOTE   Diagnosis: Pancreas cancer  INTERVAL HISTORY:   Ms. Deprey returns as scheduled.  She completed another cycle of gemcitabine/Abraxane 08/17/2022.  She reports intermittent abdominal "discomfort" since last office visit/chemotherapy.  No nausea or vomiting.  Bowels are moving.  No mouth sores.  She notes neuropathy symptoms are now "constant".  Symptoms do not interfere with activity.  Appetite is poor.  She has lost weight since her last visit.  No fever or chills.  She is relocating to [place], planning to leave tomorrow morning.  Objective:  Vital signs in last 24 hours:  Blood pressure 132/65, pulse 70, temperature 98.2 F (36.8 C), temperature source Oral, resp. rate 18, height '5\' 6"'$  (1.676 m), weight 201 lb (91.2 kg), SpO2 99 %.    HEENT: Mild white coating over tongue.  No buccal thrush. Resp: Lungs clear bilaterally. Cardio: Regular rate and rhythm. GI: Abdomen soft and nontender.  No hepatosplenomegaly. Vascular: No leg edema.  Alert and oriented.  Vibratory sense mildly decreased over the fingertips per tuning fork exam. Port-A-Cath without erythema.  Lab Results:  Lab Results  Component Value Date   WBC 8.3 09/07/2022   HGB 10.8 (L) 09/07/2022   HCT 34.5 (L) 09/07/2022   MCV 88.2 09/07/2022   PLT 225 09/07/2022   NEUTROABS 5.8 09/07/2022    Imaging:  No results found.  Medications: I have reviewed the patient's current medications.  Assessment/Plan: Pancreas cancer 03/22/2022 CT abdomen/pelvis-ill-defined 3.6 x 2.5 cm hypodense mass in the uncinate process of the pancreas; possible associated acute pancreatitis; fatty liver; faint hypodense focus in the inferior right lobe of the liver too small to characterize; no intrahepatic biliary dilatation.   03/23/2022 MRI-hypoenhancing mass in the uncinate process/inferior head of the pancreas abutting the SMV with distortion of the SMV, less than 180 degree involvement by  MR but with limited assessment; pancreatic duct narrowing/obstruction and early involvement of the distal common bile duct; subtle hepatic lesion in the right hepatic lobe; hazy mesentery may reflect concomitant mesenteritis or lymphatic congestion; small lymph nodes adjacent to the masslike area in the head of the pancreas. 03/26/2022 upper EUS-mass identified in the pancreatic head and uncinate process, appearance consistent with adenocarcinoma.  FNA pancreas head showed malignant cells consistent with adenocarcinoma. 04/07/2022 CT abdomen/pelvis pancreatic protocol-adenocarcinoma within the pancreatic uncinate process, isolated right hepatic lobe lesion suspicious for metastatic disease, contact of the SMV by tumor over an approximately 180 degree span with no evidence of occlusion and no arterial involvement, nonspecific borderline enlarged portacaval lymph node, small bowel mesenteric nodes most likely related to mesenteric adenitis/panniculitis Cycle 1 gemcitabine/Abraxane 04/27/2022 Cycle 2 gemcitabine/Abraxane 05/11/2022 Cycle 3 gemcitabine/Abraxane 05/25/2022 Cycle 4 gemcitabine/Abraxane 06/08/2022 Cycle 5 gemcitabine/Abraxane 06/21/2022, gemcitabine and Abraxane dose reduced due to thrombocytopenia Cycle 6 gemcitabine/Abraxane 07/05/2022 07/13/2022 CTs-reduced size of pancreatic head mass.  Several new hypodense lesions in the liver. 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  Fever 07/09/2022 after chemotherapy-evaluated in emergency  department, cultures and chest x-ray negative; fever potentially related to Gemcitabine    Disposition: Ms. Joswick is on active treatment with gemcitabine/Abraxane.  She completed another cycle on 08/17/2022.  She presents today for routine  follow-up prior to chemotherapy and reports she is planning to relocate to [place], leaving tomorrow morning.  She provided [place] with the name of a [place], in [place].  We are making an urgent referral.  She is having abdominal discomfort, has lost weight since her last visit.  Review of labs from today shows progressive elevation of transaminases.  We discussed the possibility of stent occlusion.  She also has progressive neuropathy symptoms likely due to Abraxane.  We decided to hold today's treatment.  We recommended if at all possible she remain in [place] until the stent can be evaluated.  Her son is trying to make arrangements for her to stay in town so this can be completed.  They understand to seek emergency evaluation with fever, chills, worsening abdominal pain.  At present there plan is to leave for [place] tomorrow morning.  We are available to see her in the future if she returns to this area.  Patient seen with Dr. Benay Spice.  Ned Card ANP/GNP-BC   09/07/2022  10:41 AM This was a shared visit with Ned Card.  Ms. Kearn was scheduled for gemcitabine/Abraxane today.  The liver enzymes are more elevated and she has increased neuropathy symptoms.  She plans to relocate to [place] tomorrow.  We asked her to remain in [place] so the elevated liver enzymes can be further evaluated.  She will need a restaging CT evaluation when she moves to [place].  I am available to see her as needed.  I was present for greater than 50% of today's visit.  I performed medical decision making.  Julieanne Manson, MD

## 2022-09-07 NOTE — Telephone Encounter (Signed)
Referral has been sent to: [place]   [facility]: [phone] FX: [phone]   Referral Insurance/Demographics Progress note Labs CT scans Surgical path Cytology    Confirmation was received.

## 2022-09-09 LAB — CANCER ANTIGEN 19-9: CA 19-9: 577 U/mL — ABNORMAL HIGH (ref 0–35)

## 2022-09-13 ENCOUNTER — Other Ambulatory Visit: Payer: Self-pay

## 2022-09-13 DIAGNOSIS — C250 Malignant neoplasm of head of pancreas: Secondary | ICD-10-CM

## 2022-09-16 ENCOUNTER — Other Ambulatory Visit (HOSPITAL_BASED_OUTPATIENT_CLINIC_OR_DEPARTMENT_OTHER): Payer: Self-pay

## 2022-09-17 ENCOUNTER — Encounter

## 2022-09-17 ENCOUNTER — Ambulatory Visit: Admit: 2022-09-17 | Discharge: 2022-09-17 | Payer: MEDICARE | Attending: Hematology & Oncology

## 2022-09-17 ENCOUNTER — Ambulatory Visit: Payer: MEDICARE

## 2022-09-17 DIAGNOSIS — C259 Malignant neoplasm of pancreas, unspecified: Secondary | ICD-10-CM

## 2022-09-17 DIAGNOSIS — C787 Secondary malignant neoplasm of liver and intrahepatic bile duct: Secondary | ICD-10-CM | POA: Diagnosis not present

## 2022-09-17 LAB — COMPREHENSIVE METABOLIC PANEL
ALT: 105 U/L — ABNORMAL HIGH (ref 0–32)
AST: 58 U/L — ABNORMAL HIGH (ref 0–31)
Albumin: 3.1 g/dL — ABNORMAL LOW (ref 3.5–5.2)
Alkaline Phosphatase: 446 U/L — ABNORMAL HIGH (ref 35–104)
Anion Gap: 11 mmol/L (ref 7–16)
BUN: 13 mg/dL (ref 6–23)
CO2: 28 mmol/L (ref 22–29)
Calcium: 9.4 mg/dL (ref 8.6–10.2)
Chloride: 92 mmol/L — ABNORMAL LOW (ref 98–107)
Creatinine: 0.5 mg/dL (ref 0.50–1.00)
Est, Glom Filt Rate: >60 mL/min/{1.73_m2} (ref >60)
Glucose: 405 mg/dL — ABNORMAL HIGH (ref 74–99)
Potassium: 3.1 mmol/L — ABNORMAL LOW (ref 3.5–5.0)
Sodium: 131 mmol/L — ABNORMAL LOW (ref 132–146)
Total Bilirubin: 1.1 mg/dL (ref 0.0–1.2)
Total Protein: 7.5 g/dL (ref 6.4–8.3)

## 2022-09-17 LAB — CBC WITH AUTO DIFFERENTIAL
Absolute Immature Granulocyte: 0.04 10*3/uL (ref 0.00–0.58)
Basophils %: 0 % (ref 0.0–2.0)
Basophils Absolute: 0.03 10*3/uL (ref 0.00–0.20)
Eosinophils %: 2 % (ref 0–6)
Eosinophils Absolute: 0.2 10*3/uL (ref 0.05–0.50)
Hematocrit: 33.7 % — ABNORMAL LOW (ref 34.0–48.0)
Hemoglobin: 10.5 g/dL — ABNORMAL LOW (ref 11.5–15.5)
Immature Granulocytes: 1 % (ref 0.0–5.0)
Lymphocytes %: 14 % — ABNORMAL LOW (ref 20.0–42.0)
Lymphocytes Absolute: 1.16 10*3/uL — ABNORMAL LOW (ref 1.50–4.00)
MCH: 26.9 pg (ref 26.0–35.0)
MCHC: 31.2 g/dL — ABNORMAL LOW (ref 32.0–34.5)
MCV: 86.4 fL (ref 80.0–99.9)
MPV: 10.7 fL (ref 7.0–12.0)
Monocytes %: 11 % (ref 2.0–12.0)
Monocytes Absolute: 0.95 10*3/uL (ref 0.10–0.95)
Neutrophils %: 71 % (ref 43.0–80.0)
Neutrophils Absolute: 5.96 10*3/uL (ref 1.80–7.30)
Platelets: 169 10*3/uL (ref 130–450)
RBC: 3.9 m/uL (ref 3.50–5.50)
RDW: 15.9 % — ABNORMAL HIGH (ref 11.5–15.0)
WBC: 8.3 10*3/uL (ref 4.5–11.5)

## 2022-09-17 NOTE — Progress Notes (Signed)
Patient provided with discharge instructions.  All questions answered.  Patient understands follow up plan of care.

## 2022-09-17 NOTE — Telephone Encounter (Signed)
Met with patient and her niece, Edwena Felty, during her  initial consultation with Dr. Ky Barban Amil  for her  pancreatic cancer diagnosis. Introduced myself and explained my role with patients receiving treatment at our center.  Patient was friendly and receptive. Instructed on next steps including referral to Dr. Luz Brazen, for stent evaluation and CT of chest, abdomen and pelvis per Dr. Ky Barban Amil's recommendations and follow up care. Provided patient with transportation resource list and literature on support groups services both local and on line and [facility] Pancreatic Cancer book. Reviewed resources available to her  such as Social Work Scientist, product/process development. Patient states tht she has lost 75 lbs since March and does not have a good appetite.  She states that she was using supplements at one time but has not recently.  Will refer to dietician at this time.  Patient niece states that she is patient's primary driver and no other needs identified at this time. New patient nursing assessment, medical history, surgical history, family history completed. Medication list reviewed and updated. Provided with my contact information and instructed patient to call me with questions or concerns. Verbalizes understanding. Patient appreciative of visit. Will continue to follow. Alyssa Grove BSN, RN-OCN Nurse Navigator

## 2022-09-17 NOTE — Progress Notes (Unsigned)
[facility] PHYSICIANS [facility] CARE [facility] MED ONCOLOGY  [address]  [place] [address]  Dept: [phone]  Loc: [phone]  Attending Consult Note      Reason for Visit:   ***    Referring Physician:  ***    PCP:  No primary care provider on file.    History of Present Illness:      75 lbs   Review of Systems;  CONSTITUTIONAL: No fever, chills. Good appetite and energy level.   ENMT: Eyes: No diplopia; Nose: No epistaxis. Mouth: No sore throat.  RESPIRATORY: No hemoptysis, shortness of breath, cough.   CARDIOVASCULAR: No chest pain, palpitations.  GASTROINTESTINAL: pos for nausea. N  abdominal pain, diarrhea/constipation.  GENITOURINARY: No dysuria, urinary frequency, hematuria.  NEURO: No syncope, presyncope, headache.  Remainder:  ROS NEGATIVE    Past Medical History:      Diagnosis Date   . Diabetes mellitus (HCC)    . Endometrial cancer (HCC)    . Hyperlipidemia    . Hypertension    . Pancreatic cancer (HCC)      There is no problem list on file for this patient.       Past Surgical History:      Procedure Laterality Date   . CHOLECYSTECTOMY     . COLONOSCOPY     . HYSTERECTOMY, TOTAL ABDOMINAL (CERVIX REMOVED)     . SPINE SURGERY      Plate placed in back   . UPPER GASTROINTESTINAL ENDOSCOPY         Family History:  Family History   Problem Relation Age of Onset   . Breast Cancer Sister 106   . Cancer Sister 74        throat   . Cancer Maternal Grandmother 49        unknown type       Medications:  Reviewed and reconciled.    Social History:  Social History     Socioeconomic History   . Marital status: Unknown     Spouse name: Not on file   . Number of children: Not on file   . Years of education: Not on file   . Highest education level: Not on file   Occupational History   . Not on file   Tobacco Use   . Smoking status: Never   . Smokeless tobacco: Never   Vaping Use   . Vaping Use: Never used   Substance and Sexual Activity   . Alcohol use: Yes     Comment: occassionaly   . Drug  use: Never   . Sexual activity: Not on file   Other Topics Concern   . Not on file   Social History Narrative   . Not on file     Social Determinants of Health     Financial Resource Strain: Not on file   Food Insecurity: Not on file   Transportation Needs: Not on file   Physical Activity: Not on file   Stress: Not on file   Social Connections: Not on file   Intimate Partner Violence: Not on file   Housing Stability: Not on file       Allergies:  Allergies   Allergen Reactions   . Molds & Smuts    . Nicotine    . Shellfish Allergy      Throat seemed to close       Physical Exam:  BP (!) 177/84   Pulse 70  Temp 97.3 F (36.3 C)   Ht 1.676 m (5\' 6" )   Wt 88.4 kg (194 lb 14.4 oz)   SpO2 98%   BMI 31.46 kg/m   GENERAL: Alert, oriented x 3, not in acute distress.  HEENT: PERRLA; EOMI. Oropharynx clear.   NECK: Supple. No palpable cervical or supraclavicular lymphadenopathy.   LUNGS: Good air entry bilaterally. No wheezing, crackles or rhonchi.   CARDIOVASCULAR: Regular rate. No murmurs, rubs or gallops.   ABDOMEN: Soft. Non-tender, non-distended. Positive bowel sounds.  EXTREMITIES: Without clubbing, cyanosis, or edema.   NEUROLOGIC: No focal deficits.     ECOG PS 1        No results found.     Impression/Plan:  ***    Thank you for allowing [place] to participate in the care of @PATIENTFIRSTNAME @ @PATIENTLASTNAME @    Yulanda Diggs EL AMIL, MD   HEMATOLOGY/MEDICAL ONCOLOGY  [facility] PHYSICIANS [address] CARE [facility] MED ONCOLOGY  [address]  [place] [address]  Dept: [phone]  Loc: [phone]

## 2022-09-17 NOTE — Progress Notes (Signed)
Gabriella Blake  11-Sep-1941 81 y.o.      Referring Physician: Dr. Kavin Leech - [place]    PCP: No primary care provider on file.    Vitals:    09/17/22 1452   BP: (!) 177/84   Pulse: 70   Temp: 97.3 F (36.3 C)   SpO2: 98%        Wt Readings from Last 3 Encounters:   09/17/22 88.4 kg (194 lb 14.4 oz)        Body mass index is 31.46 kg/m.          Chief Complaint:   Chief Complaint   Patient presents with    Pancreatic Cancer    Follow-up          Cancer Staging   No matching staging information was found for the patient.    Prior Radiation Therapy? NO    Concurrent Chemo/radiation? NO    Prior Chemotherapy? YES: Site Treated: Pancreas - Gemzar/Abraxane          Facility: [facility]          Date: 03/2022-08/2022    Prior Hormonal Therapy? NO    Head and Neck Cancer? No, patient does NOT have HN cancer.      LMP: had hysterectomy    Age at first Menses: 87    Gravida: 5    Para: 2          Current Outpatient Medications:     carvedilol (COREG) 25 MG tablet, Take 1 tablet by mouth 2 times daily, Disp: , Rfl:     UNIFINE PENTIPS 31G X 5 MM MISC, , Disp: , Rfl:     CREON 36000-114000 units CPEP delayed release capsule, Take by mouth 3 times daily (with meals), Disp: , Rfl:     potassium chloride (KLOR-CON M) 20 MEQ extended release tablet, Take by mouth daily, Disp: , Rfl:     glipiZIDE (GLUCOTROL) 5 MG tablet, Take 2 tablets by mouth 2 times daily (before meals), Disp: , Rfl:     traMADol (ULTRAM) 50 MG tablet, , Disp: , Rfl:     lisinopril (PRINIVIL;ZESTRIL) 20 MG tablet, Take 1 tablet by mouth daily, Disp: , Rfl:     magnesium 30 MG tablet, Take 1 tablet by mouth daily, Disp: , Rfl:     ondansetron (ZOFRAN) 4 MG tablet, Take 1 tablet by mouth every 8 hours as needed for Nausea or Vomiting, Disp: , Rfl:     b complex vitamins capsule, Take 1 capsule by mouth daily, Disp: , Rfl:     Calcium Carb-Vit D-C-E-Mineral (OS-CAL ULTRA) 600 MG TABS, Take by mouth, Disp: , Rfl:     Cholecalciferol  (VITAMIN D) 50 MCG (2000 UT) CAPS capsule, Take by mouth, Disp: , Rfl:     doxazosin (CARDURA) 4 MG tablet, Take 1 tablet by mouth nightly, Disp: , Rfl:        Past Medical History:   Diagnosis Date    Diabetes mellitus ([place])     Endometrial cancer ([place])     Hyperlipidemia     Hypertension     Pancreatic cancer ([place])        Past Surgical History:   Procedure Laterality Date    CHOLECYSTECTOMY      COLONOSCOPY      HYSTERECTOMY, TOTAL ABDOMINAL (CERVIX REMOVED)      SPINE SURGERY      Plate placed in back    UPPER GASTROINTESTINAL ENDOSCOPY  Family History   Problem Relation Age of Onset    Breast Cancer Sister 13    Cancer Sister 19        throat    Cancer Maternal Grandmother 24        unknown type       Social History     Socioeconomic History    Marital status: Unknown     Spouse name: Not on file    Number of children: Not on file    Years of education: Not on file    Highest education level: Not on file   Occupational History    Not on file   Tobacco Use    Smoking status: Never    Smokeless tobacco: Never   Vaping Use    Vaping Use: Never used   Substance and Sexual Activity    Alcohol use: Yes     Comment: occassionaly    Drug use: Never    Sexual activity: Not on file   Other Topics Concern    Not on file   Social History Narrative    Not on file     Social Determinants of Health     Financial Resource Strain: Not on file   Food Insecurity: Not on file   Transportation Needs: Not on file   Physical Activity: Not on file   Stress: Not on file   Social Connections: Not on file   Intimate Partner Violence: Not on file   Housing Stability: Not on file           Occupation: retired  Retired:  YES: Patient is retired from [employer], [employer].          REVIEW OF SYSTEMS: <<For Level 5, 10 or more systems>>     Pacemaker/Defibulator/ICD:  No    Mediport: Yes - right chest           FALLS RISK SCREENING ASSESSMENT    Instructions:  Assess the patient and circle the appropriate indicators that are present for fall  risk identification.   Total the numbers circled and assign a fall risk score from Table 2.  Reassess patient at a minimum every 12 weeks or with status change.    Assessment   Date  09/17/2022     1.  Mental Ability: confusion/cognitively impaired No - 0       2.  Elimination Issues: incontinence, frequency Yes - 3       3.  Ambulatory: use of assistive devices (walker, cane, off-loading devices), attached to equipment (IV pole, oxygen) No - 0     4.  Sensory Limitations: dizziness, vertigo, impaired vision No - 0       5.  Age 27 years or greater - 1       58.  Medication: diuretics, strong analgesics, hypnotics, sedatives, antihypertensive agents   Yes - 3   7.  Falls:  recent history of falls within the last 3 months (not to include slipping or tripping)   No - 0   TOTAL 7    If score of 4 or greater was education given? Yes       TABLE 2   Risk Score Risk Level Plan of Care   0-3 Little or  No Risk 1.  Provide assistance as indicated for ambulation activities  2.  Reorient confused/cognitively impaired patient  3.  Call-light/bell within patient's reach  4.  Chair/bed in low position, stretcher/bed with siderails up except when performing patient care  activities  5.  Educate patient/family/caregiver on falls prevention  6.  Reassess in 12 weeks or with any noted change in patient condition which places them at a risk for a fall   4-6 Moderate Risk 1.  Provide assistance as indicated for ambulation activities  2.  Reorient confused/cognitively impaired patient  3.  Call-light/bell within patient's reach  4.  Chair/bed in low position, stretcher/bed with siderails up except when performing patient care activities  5.  Educate patient/family/caregiver on falls prevention  6.  Falls risk precaution (Yellow sticker Level II) placed on patient chart   7 or   Higher High Risk 1.  Place patient in easily observable treatment room  2.  Patient attended at all times by family member or staff  3.  Provide assistance as  indicated for ambulation activities  4.  Reorient confused/cognitively impaired patient  5.  Call-light/bell within patient's reach  6.  Chair/bed in low position, stretcher/bed with siderails up except when performing patient care activities  7.  Educate patient/family/caregiver on falls prevention  8.  Falls risk precaution (Yellow sticker Level III) placed on patient chart           MALNUTRITION RISK SCREENING ASSESSMENT    Instructions:  Assess the patient and enter the appropriate indicators that are present for nutrition risk identification. Total the numbers entered and assign a risk score. Follow the appropriate action for total score listed below.     Assessment   Date  09/17/2022     Have you lost weight without trying?      4- Yes, >15 kg  75 lbs since March   Have you been eating poorly because of a decreased appetite?         1- Yes   3. Do you have a diagnosis of head and neck cancer OR will you be receiving neoadjuvant treatment for breast cancer?      0- No                                                                                    TOTAL 5        Score of 0-1: No action  Score 2 or greater:  For Non-Diabetic Patient: Recommend adding Ensure Enlive 2 x daily and provide patient with Ensure wellness bag with coupons  For Diabetic Patient: Recommend adding Glucerna Shake 2 x daily and provide patient with Glucerna Wellness bag with coupons  Route to the dietitian via [place]    Are you having  difficulty performing daily routine tasks  due to fatigue or weakness (ie: bathing/showering, dressing, housework, meal prep, work, child care.): No     Do you have any arm flexibility/ROM restrictions, swelling or pain that limit activity: No     Any changes in memory, attention/focus that impact daily activities: No     Do you avoid participation in leisure/social activity due weakness, fatigue or pain: No     ARE ANY OF THE ABOVE ARE ANSWERED YES: No          PT ASSESSMENT  FOR REFERRAL  Have you had any recent falls in past 2 months: No     Do you have difficulty  going up/down stairs: No     Are you having difficulty walking: Yes     Do you often hold onto furniture/environmental supports or feel off balance when you are walking: Yes     Do you need to take rest breaks when you are walking: Yes     Any pain on scale of 1-10 that limits your mobility: Yes 2/10    ARE ANY OF THE ABOVE ARE ANSWERED YES: Yes - but NO PT referral request sent due to patient wishes to defer till later.         PELVIC FLOOR DYSFUNCTION SCREENING ASSESSMENT    - Do you have any pelvic pain? No     - Do you have any fecal constipation or fecal incontinence? No     - Do you have any urinary incontinence or difficulty starting to urinate? No     ARE ANY OF THE ABOVE ARE ANSWERED YES: No           PREHAB AUDIOLOGY REFERRAL    - Is patient planned to receive Cisplatin? No. This patient is not planned to start Cisplatin.    - Is patient complaining of new onset hearing loss? No. Patient is not complaining of new onset hearing loss.        Alyssa Grove, RN

## 2022-09-20 MED ORDER — POTASSIUM CHLORIDE CRYS ER 20 MEQ PO TBCR
20 MEQ | ORAL_TABLET | Freq: Two times a day (BID) | ORAL | 0 refills | Status: AC
Start: 2022-09-20 — End: 2022-11-26

## 2022-09-20 NOTE — Telephone Encounter (Signed)
Spoke with patient, she recently moved here and is getting established. Closest pharmacy [facility] [address] in [place], this nurse stated her new potassium prescription will be sent there. Her potassium is low at 3.1. Patient verbalized understanding.

## 2022-09-22 NOTE — Telephone Encounter (Signed)
Called to speak with Easter but she was sleeping, spoke with her sister [place]. Explained Palliative Care and location of clinic. [place] took contact number and will have Benjamine Mola contact our team.

## 2022-09-23 ENCOUNTER — Encounter

## 2022-09-23 ENCOUNTER — Inpatient Hospital Stay: Admit: 2022-09-23 | Payer: MEDICARE | Attending: Hematology & Oncology

## 2022-09-23 DIAGNOSIS — C250 Malignant neoplasm of head of pancreas: Secondary | ICD-10-CM

## 2022-09-23 DIAGNOSIS — K8689 Other specified diseases of pancreas: Secondary | ICD-10-CM | POA: Diagnosis not present

## 2022-09-23 DIAGNOSIS — Z4682 Encounter for fitting and adjustment of non-vascular catheter: Secondary | ICD-10-CM | POA: Diagnosis not present

## 2022-09-23 DIAGNOSIS — Z9049 Acquired absence of other specified parts of digestive tract: Secondary | ICD-10-CM | POA: Diagnosis not present

## 2022-09-23 MED ORDER — NORMAL SALINE FLUSH 0.9 % IV SOLN
0.9 % | INTRAVENOUS | Status: AC | PRN
Start: 2022-09-23 — End: 2022-09-26
  Administered 2022-09-23: 18:00:00 10 mL via INTRAVENOUS

## 2022-09-23 MED ORDER — IOPAMIDOL 76 % IV SOLN
76 % | Freq: Once | INTRAVENOUS | Status: AC | PRN
Start: 2022-09-23 — End: 2022-09-23
  Administered 2022-09-23: 18:00:00 75 mL via INTRAVENOUS

## 2022-09-23 NOTE — Telephone Encounter (Addendum)
Prior Authorization Form:      DEMOGRAPHICS:                     Patient Name:  Jailin Manocchio  Patient DOB:  06-Jul-1941            Insurance:  Payor: HUMANA MEDICARE / Plan: HU[employer]NA CHOICE-PPO MEDICARE / Product Type: *No Product type* /   Insurance ID Number:    Payer/Plan Subscr DOB Sex Relation Sub. Ins. ID Effective Group Num   1. Mcarthur Rossetti MEDICASHEYLI, HORWITZ 1941-09-18 Female Self [id number] 10/12/19 [id number]                                   [address] [address]         DIAGNOSIS & PROCEDURE:                       Procedure/Operation: ercp stent exchange           CPT Code: 50354    Diagnosis:  pancreatic cancer    ICD10 Code: c25.9    Location:  [facility]    Surgeon:  Cherlynn Kaiser INFORMATION:                          Date: 12-19 -23   Time: tbd.              Anesthesia:  MAC/TIVA                                                       Status:  Outpatient        Special Comments:  na       Electronically signed by Milinda Pointer, LPN on 65/68/1275 at 1:45 PM

## 2022-09-23 NOTE — Telephone Encounter (Signed)
Called an let message at Dr. Naomie Dean office to inquire about status of referral.  Received call back and patient scheduled for stent exchange on 11/02/22.  Did explain to staff possibility of stent needing exchanged sooner.  Staff stated to call office if needed and they would speak with physician but at this time this is their earliest appointment.

## 2022-09-23 NOTE — Telephone Encounter (Signed)
Pt's sister received the date, time and instructions for her ERCP. 11-02-22 at 12:45 [facility]. NPO after midnight and will need a driver. She understood directions and if any problems they will contact our office. Electronically signed by Milinda Pointer, LPN on 15/17/6160 at 1:58 PM

## 2022-09-24 LAB — CANCER ANTIGEN 19-9: CA 19-9: 249 U/mL — ABNORMAL HIGH (ref 0–35)

## 2022-09-24 NOTE — Progress Notes (Signed)
[facility] PRE-ADMISSION TESTING   ENDOSCOPY/ COLONSCOPY INSTRUCTIONS  PAT- Phone Number: [phone]    ENDOSCOPY/ COLONSCOPY INSTRUCTIONS:     '[x]'$  Nothing by mouth after midnight. Including no gum, candy, mints, or water.   '[x]'$  Do not wear makeup, lotions, powders, deodorant.   '[x]'$  No tobacco products, illegal drugs, or alcohol within 24 hours of your surgery.  '[x]'$  Jewelry or valuables should not be brought to the hospital. All body and/or tongue piercing's must be removed prior to arriving to hospital. No contact lens or hair pins.   '[x]'$  Arrange transportation with a responsible adult companion to and from the hospital. If you do not have a responsible adult companion to transport you, you will need to make arrangements with a medical transportation company. Arrange for someone to be with you for the remainder of the day and for 24 hours after your procedure due to having had anesthesia.    -Who will be your companion for transportation?__Everyln____   -Who will be staying with you for 24 hrs after your procedure?__________________    PARKING INSTRUCTIONS:     '[x]'$  ARRIVAL DATE & TIME:  12/19  @  1000  '[x]'$  Times are subject to change. We will contact you the business day before surgery if that were to occur.  '[x]'$  Enter into the [employer]. Two people may accompany you. Masks are not required.  '[x]'$  Parking Lot "I" is where you will park. It is located on the corner of [address]. The entrance is on [facility].   Only one vehicle - per patient, is permitted in parking lot.   Upon entering the parking lot, a voucher ticket will print.    MEDICATION INSTRUCTIONS:    '[x]'$  Bring a complete list of your medications, please write the last time you took the medicine, give this list to the nurse in Pre-Op.  '[x]'$  Take only the following medications the morning of surgery with 1-2 ounces of water: carvedilol  '[x]'$  Stop all herbal supplements and vitamins 5 days  before surgery.   '[x]'$  Stop all NSAIDS 7 days before surgery.  '[x]'$  DO NOT take any diabetic medicine the morning of surgery.  Follow instructions for insulin the day before surgery.  '[x]'$  If you are diabetic and your blood sugar is low or you feel symptomatic, you may drink 1-2 ounces of apple juice or take a glucose tablet.            -The morning of your procedure, you may call the pre-op area if you have concerns about your blood sugar [phone].  '[]'$  Use your inhalers the morning of surgery.  Bring your emergency inhaler with you day of surgery.  '[x]'$  Follow physician instructions regarding any blood thinners you may be taking.    WHAT TO EXPECT:    '[x]'$  The day of your procedure you will be greeted and checked in by the [employer].  In addition, you will be registered in the [facility] by a Patient Barista. Please bring your photo ID and insurance card. A nurse will greet you in accordance to the time you are needed in the pre-op area to prepare you for surgery. Please do not be discouraged if you are not greeted in the order you arrive as there are many variables that are involved in patient preparation. Your patience is greatly appreciated as you wait for your nurse.  '[x]'$  Delays may occur. Staff will make a sincere  effort to keep you informed of delays. If any delays occur with your procedure, we apologize ahead of time for your inconvenience as we recognize the value of your time.

## 2022-09-24 NOTE — Progress Notes (Signed)
Unable to perform chemo teach Monday 12/18 due to no pharmacist present. Per Altha Harm ok to perform chemo teach same day as chemo.

## 2022-09-27 NOTE — Progress Notes (Signed)
Patient notified of new arrival time for procedure tomorrow. New arrival time is now 66. Patient verbalized understanding.

## 2022-09-28 ENCOUNTER — Inpatient Hospital Stay: Payer: MEDICARE

## 2022-09-28 ENCOUNTER — Encounter: Payer: MEDICARE | Attending: Hematology & Oncology

## 2022-09-28 ENCOUNTER — Ambulatory Visit: Admit: 2022-09-28 | Payer: MEDICARE

## 2022-09-28 ENCOUNTER — Inpatient Hospital Stay: Payer: Medicare (Managed Care) | Attending: Gastroenterology

## 2022-09-28 ENCOUNTER — Other Ambulatory Visit (HOSPITAL_BASED_OUTPATIENT_CLINIC_OR_DEPARTMENT_OTHER): Payer: Self-pay

## 2022-09-28 ENCOUNTER — Other Ambulatory Visit: Payer: Self-pay | Admitting: Nurse Practitioner

## 2022-09-28 DIAGNOSIS — Z9689 Presence of other specified functional implants: Secondary | ICD-10-CM | POA: Diagnosis not present

## 2022-09-28 DIAGNOSIS — I10 Essential (primary) hypertension: Secondary | ICD-10-CM | POA: Diagnosis not present

## 2022-09-28 DIAGNOSIS — T85520A Displacement of bile duct prosthesis, initial encounter: Secondary | ICD-10-CM | POA: Diagnosis not present

## 2022-09-28 DIAGNOSIS — E119 Type 2 diabetes mellitus without complications: Secondary | ICD-10-CM | POA: Diagnosis not present

## 2022-09-28 DIAGNOSIS — K838 Other specified diseases of biliary tract: Secondary | ICD-10-CM | POA: Diagnosis not present

## 2022-09-28 DIAGNOSIS — K831 Obstruction of bile duct: Secondary | ICD-10-CM | POA: Diagnosis not present

## 2022-09-28 DIAGNOSIS — Z7984 Long term (current) use of oral hypoglycemic drugs: Secondary | ICD-10-CM | POA: Diagnosis not present

## 2022-09-28 DIAGNOSIS — Z4659 Encounter for fitting and adjustment of other gastrointestinal appliance and device: Secondary | ICD-10-CM | POA: Diagnosis not present

## 2022-09-28 LAB — POCT GLUCOSE: POC Glucose: 248 mg/dL — ABNORMAL HIGH (ref 74–99)

## 2022-09-28 MED ORDER — ROCURONIUM BROMIDE 50 MG/5ML IV SOLN
50 MG/5ML | INTRAVENOUS | Status: AC
Start: 2022-09-28 — End: ?

## 2022-09-28 MED ORDER — INDOMETHACIN 50 MG RE SUPP
50 | RECTAL | Status: DC
Start: 2022-09-28 — End: 2022-09-28

## 2022-09-28 MED ORDER — LIDOCAINE HCL 2 % IJ SOLN
2 % | INTRAMUSCULAR | Status: DC | PRN
Start: 2022-09-28 — End: 2022-09-28
  Administered 2022-09-28: 17:00:00 80 via INTRAVENOUS

## 2022-09-28 MED ORDER — SODIUM CHLORIDE 0.9 % IV SOLN
0.9 % | INTRAVENOUS | Status: DC | PRN
Start: 2022-09-28 — End: 2022-09-28
  Administered 2022-09-28: 17:00:00 via INTRAVENOUS

## 2022-09-28 MED ORDER — INDOMETHACIN 50 MG RE SUPP
50 MG | RECTAL | Status: AC
Start: 2022-09-28 — End: ?

## 2022-09-28 MED ORDER — PROPOFOL 200 MG/20ML IV EMUL
200 MG/20ML | INTRAVENOUS | Status: AC
Start: 2022-09-28 — End: ?

## 2022-09-28 MED ORDER — GLYCOPYRROLATE 1 MG/5ML IJ SOLN
1 MG/5ML | INTRAMUSCULAR | Status: AC
Start: 2022-09-28 — End: ?

## 2022-09-28 MED ORDER — IOPAMIDOL 61 % IV SOLN
61 % | INTRAVENOUS | Status: DC | PRN
Start: 2022-09-28 — End: 2022-09-28
  Administered 2022-09-28: 18:00:00 8 via INTRAVENOUS

## 2022-09-28 MED ORDER — GLYCOPYRROLATE 1 MG/5ML IV SOSY
1 MG/5ML | INTRAVENOUS | Status: DC | PRN
Start: 2022-09-28 — End: 2022-09-28
  Administered 2022-09-28: 17:00:00 .2 via INTRAVENOUS

## 2022-09-28 MED ORDER — SODIUM CHLORIDE 0.9 % IV SOLN
0.9 % | INTRAVENOUS | Status: DC
Start: 2022-09-28 — End: 2022-09-28
  Administered 2022-09-28: 15:00:00 via INTRAVENOUS

## 2022-09-28 MED ORDER — PROPOFOL 200 MG/20ML IV EMUL
200 MG/20ML | INTRAVENOUS | Status: DC | PRN
Start: 2022-09-28 — End: 2022-09-28
  Administered 2022-09-28: 17:00:00 240 via INTRAVENOUS

## 2022-09-28 MED ORDER — LACTATED RINGERS IV SOLN
INTRAVENOUS | Status: DC
Start: 2022-09-28 — End: 2022-09-28

## 2022-09-28 MED ORDER — LIDOCAINE (CARDIAC) 100 MG/5ML IV SOLN (MIXTURES ONLY)
100 MG/5ML | INTRAVENOUS | Status: AC
Start: 2022-09-28 — End: ?

## 2022-09-28 MED ORDER — INDOMETHACIN 50 MG RE SUPP
50 MG | RECTAL | Status: DC | PRN
Start: 2022-09-28 — End: 2022-09-28
  Administered 2022-09-28: 17:00:00 100 via RECTAL

## 2022-09-28 MED FILL — LIDOCAINE HCL (CARDIAC) PF 100 MG/5ML IV SOLN: 100 MG/5ML | INTRAVENOUS | Qty: 5

## 2022-09-28 MED FILL — GLYCOPYRROLATE 1 MG/5ML IJ SOLN: 1 MG/5ML | INTRAMUSCULAR | Qty: 5

## 2022-09-28 MED FILL — PROPOFOL 200 MG/20ML IV EMUL: 200 MG/20ML | INTRAVENOUS | Qty: 40

## 2022-09-28 MED FILL — INDOCIN 50 MG RE SUPP: 50 MG | RECTAL | Qty: 2

## 2022-09-28 MED FILL — ROCURONIUM BROMIDE 50 MG/5ML IV SOLN: 50 MG/5ML | INTRAVENOUS | Qty: 5

## 2022-09-28 NOTE — Progress Notes (Signed)
Patient admitted to [facility] Patient placed on appropriate monitors.  Patient assisted with liquids and nutrition. Family in with patient.

## 2022-09-28 NOTE — Anesthesia Post-Procedure Evaluation (Signed)
Department of Anesthesiology  Postprocedure Note    Patient: Gabriella Blake  MRN: [id number]  [place]: Jun 22, 1941  Date of evaluation: 09/28/2022    Procedure Summary       Date: 09/28/22 Room / Location: [facility]    Anesthesia Start: 1200 Anesthesia Stop: 1252    Procedures:       ERCP STENT REMOVAL/EXCHANGE      ERCP STONE REMOVAL Diagnosis:       Malignant neoplasm of pancreas, unspecified location of malignancy ([place])      (Malignant neoplasm of pancreas, unspecified location of malignancy ([place]) [C25.9])    Surgeons: Georgeann Oppenheim, MD Responsible Provider: Shelly Coss, MD    Anesthesia Type: MAC ASA Status: 4            Anesthesia Type: MAC    Aldrete Phase I: Aldrete Score: 10    Aldrete Phase II: Aldrete Score: 10    Anesthesia Post Evaluation    Patient location during evaluation: PACU  Patient participation: complete - patient participated  Level of consciousness: awake and alert  Airway patency: patent  Nausea & Vomiting: no nausea and no vomiting  Cardiovascular status: hemodynamically stable  Respiratory status: acceptable  Hydration status: euvolemic  Pain management: satisfactory to patient    No notable events documented.

## 2022-09-28 NOTE — H&P (Signed)
[facility]   Gastroenterology, Hepatology, &  Advanced Endoscopy    Consult       ASSESSMENT AND PLAN:    81y/F w/ pancreatic cancer presents for biliary stent exchange.    PLAN:  ERCP        HISTORY OF PRESENT ILLNESS:      Ms. Gabriella Blake is an 81y/F w/ history of pancreatic cancer s/p biliary stent placement in July who presents for biliary stent exchange in the setting of elevated LFTs and distally migrated stent on CT.     Past Medical History:        Diagnosis Date    Diabetes mellitus ([place])     Endometrial cancer ([place])     Hyperlipidemia     Hypertension     Pancreatic cancer ([place])      Past Surgical History:        Procedure Laterality Date    CHOLECYSTECTOMY      COLONOSCOPY      HYSTERECTOMY, TOTAL ABDOMINAL (CERVIX REMOVED)      SPINE SURGERY      Plate placed in back    UPPER GASTROINTESTINAL ENDOSCOPY       Social History:    TOBACCO:   reports that she has never smoked. She has never used smokeless tobacco.  ETOH:   reports current alcohol use.  DRUGS:   reports no history of drug use.  Family History:       Problem Relation Age of Onset    Breast Cancer Sister 65    Cancer Sister 43        throat    Cancer Maternal Grandmother 73        unknown type       No current facility-administered medications for this encounter.    Current Outpatient Medications:     potassium chloride (KLOR-CON M) 20 MEQ extended release tablet, Take 1 tablet by mouth 2 times daily, Disp: 28 tablet, Rfl: 0    carvedilol (COREG) 25 MG tablet, Take 1 tablet by mouth 2 times daily, Disp: , Rfl:     UNIFINE PENTIPS 31G X 5 MM MISC, , Disp: , Rfl:     CREON 36000-114000 units CPEP delayed release capsule, Take by mouth 3 times daily (with meals), Disp: , Rfl:     potassium chloride (KLOR-CON M) 20 MEQ extended release tablet, Take by mouth daily, Disp: , Rfl:     glipiZIDE (GLUCOTROL) 5 MG tablet, Take 2 tablets by mouth 2 times daily (before meals), Disp: , Rfl:     traMADol (ULTRAM) 50 MG tablet, , Disp: , Rfl:      lisinopril (PRINIVIL;ZESTRIL) 20 MG tablet, Take 1 tablet by mouth daily, Disp: , Rfl:     magnesium 30 MG tablet, Take 1 tablet by mouth daily, Disp: , Rfl:     ondansetron (ZOFRAN) 4 MG tablet, Take 1 tablet by mouth every 8 hours as needed for Nausea or Vomiting, Disp: , Rfl:     b complex vitamins capsule, Take 1 capsule by mouth daily, Disp: , Rfl:     Calcium Carb-Vit D-C-E-Mineral (OS-CAL ULTRA) 600 MG TABS, Take by mouth, Disp: , Rfl:     Cholecalciferol (VITAMIN D) 50 MCG (2000 UT) CAPS capsule, Take by mouth, Disp: , Rfl:     doxazosin (CARDURA) 4 MG tablet, Take 1 tablet by mouth nightly, Disp: , Rfl:      Allergies:  Molds & smuts, Nicotine, and Shellfish allergy  ROS:  General: Patient denies n/v/f/c or weight loss.  HEENT: Patient denies persistent postnasal drip, scleral icterus, drooling, persistent bleeding from nose/mouth.  Resp: Patient denies SOB, wheezing, productive cough.  Cards: Patient denies CP, palpitations, significant edema  GI: As above.  Derm: Patient denies jaundice/rashes.   Musc: Patient denies diffuse/irregular joint swelling or myalgias.     PHYSICAL EXAM:  Ht 1.676 m ('5\' 6"'$ )   Wt 88 kg (194 lb)   BMI 31.31 kg/m   Physical Exam:  General: Overall well-appearing, NAD  HEENT: PERRLA, EOMI, Anicteric sclera, MMM, no rhinorrhea  Cards: RRR, no LE edema  Resp: Breathing comfortably on room air, good air movement, no use of accessory muscles, no audible wheezing  Abdomen: soft, NT, ND.   Extremities: Moves all extremities, no effusions or bruising.  Skin: No rashes or jaundice  Neuro: A&O x 3, CN grossly intact, non-focal exam               Electronically signed by Georgeann Oppenheim, MD on 09/28/2022 at 6:16 AM

## 2022-09-28 NOTE — Anesthesia Pre-Procedure Evaluation (Signed)
Department of Anesthesiology  Preprocedure Note       Name:  Gabriella Blake   Age:  81 y.o.  DOB:  Apr 03, 1941                                          MRN:  [id number]         Date:  09/28/2022      Surgeon: Juliann Mule):  Manzo, Ofilia Neas, MD    Procedure: Procedure(s):  ERCP ENDOSCOPIC RETROGRADE CHOLANGIOPANCREATOGRAPHY  ERCP STENT REMOVAL    Medications prior to admission:   Prior to Admission medications    Medication Sig Start Date End Date Taking? Authorizing Provider   potassium chloride (KLOR-CON M) 20 MEQ extended release tablet Take 1 tablet by mouth 2 times daily 09/20/22   [place], Vernon Prey, MD   carvedilol (COREG) 25 MG tablet Take 1 tablet by mouth 2 times daily 08/26/22   [provider]   UNIFINE PENTIPS 31G X 5 MM [place]  07/15/22   [provider]   CREON 93810-175102 units CPEP delayed release capsule Take by mouth 3 times daily (with meals) 09/07/22   [provider]   potassium chloride (KLOR-CON M) 20 MEQ extended release tablet Take by mouth daily 08/09/22   [provider]   glipiZIDE (GLUCOTROL) 5 MG tablet Take 2 tablets by mouth 2 times daily (before meals) 06/16/22   [provider]   traMADol Veatrice Bourbon) 50 MG tablet  06/22/22   [provider]   lisinopril (PRINIVIL;ZESTRIL) 20 MG tablet Take 1 tablet by mouth daily    [provider]   magnesium 30 MG tablet Take 1 tablet by mouth daily    [provider]   ondansetron (ZOFRAN) 4 MG tablet Take 1 tablet by mouth every 8 hours as needed for Nausea or Vomiting    [provider]   b complex vitamins capsule Take 1 capsule by mouth daily    [provider]   Calcium Carb-Vit D-C-E-Mineral (OS-CAL ULTRA) 600 MG TABS Take by mouth    [provider]   Cholecalciferol (VITAMIN D) 50 MCG (2000 UT) CAPS capsule Take by mouth    [provider]   doxazosin (CARDURA) 4 MG tablet Take 1 tablet by mouth nightly    [provider]       Current  medications:    Current Facility-Administered Medications   Medication Dose Route Frequency Provider Last Rate Last Admin    lactated ringers IV soln infusion   IntraVENous Continuous Manzo, Ofilia Neas, MD        indomethacin (INDOCIN) 50 MG suppository 100 mg  100 mg Rectal On Call to OR Manzo, Ofilia Neas, MD        0.9 % sodium chloride infusion   IntraVENous Continuous Manzo, Ofilia Neas, MD 100 mL/hr at 09/28/22 1025 New Bag at 09/28/22 1025       Allergies:    Allergies   Allergen Reactions    Molds & Smuts     Nicotine     Shellfish Allergy      Throat seemed to close       Problem List:    Patient Active Problem List   Diagnosis Code    Malignant neoplasm of pancreas ([place]) C25.9       Past Medical History:  Diagnosis Date    Diabetes mellitus ([place])     Endometrial cancer ([place])     Hyperlipidemia     Hypertension     Pancreatic cancer ([place])        Past Surgical History:        Procedure Laterality Date    CHOLECYSTECTOMY      COLONOSCOPY      HYSTERECTOMY, TOTAL ABDOMINAL (CERVIX REMOVED)      SPINE SURGERY      Plate placed in back    UPPER GASTROINTESTINAL ENDOSCOPY         Social History:    Social History     Tobacco Use    Smoking status: Never    Smokeless tobacco: Never   Substance Use Topics    Alcohol use: Yes     Comment: occassionaly                                Counseling given: Not Answered      Vital Signs (Current):   Vitals:    09/24/22 1150 09/28/22 1007   BP:  (!) 178/84   Pulse:  73   Resp:  18   Temp:  36.4 C (97.5 F)   TempSrc:  Temporal   SpO2:  96%   Weight: 88 kg (194 lb) 88 kg (194 lb)   Height: 1.676 m (_0 ) 1.676 m (_1 )                                              BP Readings from Last 3 Encounters:   09/28/22 (!) 178/84   09/17/22 (!) 177/84       NPO Status: Time of last liquid consumption: 1900                        Time of last solid consumption: 2359                        Date of last liquid consumption: 09/27/22                        Date of last solid food consumption:  09/27/22    BMI:   Wt Readings from Last 3 Encounters:   09/28/22 88 kg (194 lb)   09/17/22 88.4 kg (194 lb 14.4 oz)     Body mass index is 31.31 kg/m.    CBC:   Lab Results   Component Value Date/Time    WBC 8.3 09/17/2022 04:15 PM    RBC 3.90 09/17/2022 04:15 PM    HGB 10.5 09/17/2022 04:15 PM    HCT 33.7 09/17/2022 04:15 PM    MCV 86.4 09/17/2022 04:15 PM    RDW 15.9 09/17/2022 04:15 PM    PLT 169 09/17/2022 04:15 PM       CMP:   Lab Results   Component Value Date/Time    NA 131 09/17/2022 04:15 PM    K 3.1 09/17/2022 04:15 PM    CL 92 09/17/2022 04:15 PM    CO2 28 09/17/2022 04:15 PM    BUN 13 09/17/2022 04:15 PM    CREATININE 0.5 09/17/2022 04:15 PM    LABGLOM >60 09/17/2022 04:15 PM    GLUCOSE  405 09/17/2022 04:15 PM    PROT 7.5 09/17/2022 04:15 PM    CALCIUM 9.4 09/17/2022 04:15 PM    BILITOT 1.1 09/17/2022 04:15 PM    ALKPHOS 446 09/17/2022 04:15 PM    AST 58 09/17/2022 04:15 PM    ALT 105 09/17/2022 04:15 PM       POC Tests:   Recent Labs     09/28/22  1021   POCGLU 248*       Coags: No results found for: "PROTIME", "INR", "APTT"    HCG (If Applicable): No results found for: "PREGTESTUR", "PREGSERUM", "HCG", "HCGQUANT"     ABGs: No results found for: "PHART", "PO2ART", "PCO2ART", "HCO3ART", "BEART", "O2SATART"     Type & Screen (If Applicable):  No results found for: "LABABO", "LABRH"    Drug/Infectious Status (If Applicable):  No results found for: "HIV", "HEPCAB"    COVID-19 Screening (If Applicable): No results found for: "COVID19"        Anesthesia Evaluation  Patient summary reviewed and Nursing notes reviewed  Airway: Mallampati: II          Dental:          Pulmonary:Negative Pulmonary ROS       (-) not a current smoker                           Cardiovascular:  Exercise tolerance: poor (<4 METS)  (+) hypertension:, dysrhythmias:, hyperlipidemia         Beta Blocker:  Dose within 24 Hrs         Neuro/Psych:   Negative Neuro/Psych ROS              GI/Hepatic/Renal:   (+) GERD: poorly controlled           Endo/Other:    (+) DiabetesType II DM, using insulin, electrolyte abnormalities, malignancy/cancer (Pancreatic cancer).                 Abdominal:             Vascular: negative vascular ROS.         Other Findings:             Anesthesia Plan      MAC     ASA 4       Induction: intravenous.      Anesthetic plan and risks discussed with patient.    Use of blood products discussed with patient whom consented to blood products.    Plan discussed with CRNA and attending.                    Martha Clan, RN   09/28/2022

## 2022-09-28 NOTE — Discharge Instructions (Addendum)
Recommendations:  -The patient will be observed post procedure until all discharge criteria are met.    -Patient has a contact number available for emergencies.  The signs and symptoms of potential delayed complications were discussed with the patient.    -Return to normal activities tomorrow.    -Written discharge instructions were provided to the patient.    -Clinic appointment to be scheduled.   Resume your normal diet  Resume your home medications.

## 2022-09-29 ENCOUNTER — Inpatient Hospital Stay: Payer: MEDICARE

## 2022-09-30 ENCOUNTER — Other Ambulatory Visit (HOSPITAL_BASED_OUTPATIENT_CLINIC_OR_DEPARTMENT_OTHER): Payer: Self-pay

## 2022-09-30 DIAGNOSIS — Z6830 Body mass index (BMI) 30.0-30.9, adult: Secondary | ICD-10-CM | POA: Diagnosis not present

## 2022-09-30 DIAGNOSIS — I10 Essential (primary) hypertension: Secondary | ICD-10-CM | POA: Diagnosis not present

## 2022-09-30 DIAGNOSIS — Z90710 Acquired absence of both cervix and uterus: Secondary | ICD-10-CM | POA: Diagnosis not present

## 2022-09-30 DIAGNOSIS — C259 Malignant neoplasm of pancreas, unspecified: Secondary | ICD-10-CM | POA: Diagnosis not present

## 2022-09-30 DIAGNOSIS — Z7189 Other specified counseling: Secondary | ICD-10-CM | POA: Diagnosis not present

## 2022-09-30 DIAGNOSIS — K831 Obstruction of bile duct: Secondary | ICD-10-CM | POA: Diagnosis not present

## 2022-09-30 DIAGNOSIS — C541 Malignant neoplasm of endometrium: Secondary | ICD-10-CM | POA: Diagnosis not present

## 2022-09-30 DIAGNOSIS — E119 Type 2 diabetes mellitus without complications: Secondary | ICD-10-CM | POA: Diagnosis not present

## 2022-09-30 DIAGNOSIS — E785 Hyperlipidemia, unspecified: Secondary | ICD-10-CM | POA: Diagnosis not present

## 2022-10-01 ENCOUNTER — Inpatient Hospital Stay: Admit: 2022-10-01 | Discharge: 2022-10-01 | Payer: MEDICARE

## 2022-10-01 ENCOUNTER — Ambulatory Visit: Admit: 2022-10-01 | Discharge: 2022-10-01 | Payer: MEDICARE | Attending: Hematology & Oncology

## 2022-10-01 DIAGNOSIS — C259 Malignant neoplasm of pancreas, unspecified: Secondary | ICD-10-CM

## 2022-10-01 LAB — CBC WITH AUTO DIFFERENTIAL
Absolute Immature Granulocyte: <0.03 10*3/uL (ref 0.00–0.58)
Basophils %: 0 % (ref 0.0–2.0)
Basophils Absolute: 0.03 10*3/uL (ref 0.00–0.20)
Eosinophils %: 4 % (ref 0–6)
Eosinophils Absolute: 0.25 10*3/uL (ref 0.05–0.50)
Hematocrit: 30.9 % — ABNORMAL LOW (ref 34.0–48.0)
Hemoglobin: 9.8 g/dL — ABNORMAL LOW (ref 11.5–15.5)
Immature Granulocytes: 0 % (ref 0.0–5.0)
Lymphocytes %: 15 % — ABNORMAL LOW (ref 20.0–42.0)
Lymphocytes Absolute: 1.07 10*3/uL — ABNORMAL LOW (ref 1.50–4.00)
MCH: 27.5 pg (ref 26.0–35.0)
MCHC: 31.7 g/dL — ABNORMAL LOW (ref 32.0–34.5)
MCV: 86.6 fL (ref 80.0–99.9)
MPV: 10.2 fL (ref 7.0–12.0)
Monocytes %: 11 % (ref 2.0–12.0)
Monocytes Absolute: 0.81 10*3/uL (ref 0.10–0.95)
Neutrophils %: 69 % (ref 43.0–80.0)
Neutrophils Absolute: 4.91 10*3/uL (ref 1.80–7.30)
Platelets: 220 10*3/uL (ref 130–450)
RBC: 3.57 m/uL (ref 3.50–5.50)
RDW: 16.3 % — ABNORMAL HIGH (ref 11.5–15.0)
WBC: 7.1 10*3/uL (ref 4.5–11.5)

## 2022-10-01 LAB — COMPREHENSIVE METABOLIC PANEL
ALT: 70 U/L — ABNORMAL HIGH (ref 0–32)
AST: 84 U/L — ABNORMAL HIGH (ref 0–31)
Albumin: 3.2 g/dL — ABNORMAL LOW (ref 3.5–5.2)
Alkaline Phosphatase: 495 U/L — ABNORMAL HIGH (ref 35–104)
Anion Gap: 10 mmol/L (ref 7–16)
BUN: 6 mg/dL (ref 6–23)
CO2: 26 mmol/L (ref 22–29)
Calcium: 9.6 mg/dL (ref 8.6–10.2)
Chloride: 100 mmol/L (ref 98–107)
Creatinine: 0.5 mg/dL (ref 0.50–1.00)
Est, Glom Filt Rate: >60 mL/min/{1.73_m2} (ref >60)
Glucose: 285 mg/dL — ABNORMAL HIGH (ref 74–99)
Potassium: 3.8 mmol/L (ref 3.5–5.0)
Sodium: 136 mmol/L (ref 132–146)
Total Bilirubin: 0.8 mg/dL (ref 0.0–1.2)
Total Protein: 7.8 g/dL (ref 6.4–8.3)

## 2022-10-01 MED ORDER — ONDANSETRON HCL 4 MG/2ML IJ SOLN
4 MG/2ML | Freq: Once | INTRAMUSCULAR | Status: AC
Start: 2022-10-01 — End: 2022-10-01
  Administered 2022-10-01: 16:00:00 8 mg via INTRAVENOUS

## 2022-10-01 MED ORDER — CLONIDINE HCL 0.1 MG PO TABS
0.1 MG | ORAL | Status: DC
Start: 2022-10-01 — End: 2022-10-02

## 2022-10-01 MED ORDER — CLONIDINE HCL 0.1 MG PO TABS
0.1 MG | Freq: Once | ORAL | Status: AC
Start: 2022-10-01 — End: 2022-10-01
  Administered 2022-10-01: 19:00:00 0.1 mg via ORAL

## 2022-10-01 MED ORDER — PACLITAXEL PROTEIN-BOUND PART 100 MG IV SUSR
100 MG | Freq: Once | INTRAVENOUS | Status: AC
Start: 2022-10-01 — End: 2022-10-01
  Administered 2022-10-01: 17:00:00 190 mg via INTRAVENOUS

## 2022-10-01 MED ORDER — GEMCITABINE HCL 1 GM/26.3ML IV SOLN
1 GM/26.3ML | Freq: Once | INTRAVENOUS | Status: AC
Start: 2022-10-01 — End: 2022-10-01
  Administered 2022-10-01: 18:00:00 1522 mg/m2 via INTRAVENOUS

## 2022-10-01 MED ORDER — HEPARIN NA (PORK) LOCK FLSH PF 100 UNIT/ML IV SOLN
100 | INTRAVENOUS | Status: DC | PRN
Start: 2022-10-01 — End: 2022-10-02
  Administered 2022-10-01: 21:00:00 500 [IU]

## 2022-10-01 MED ORDER — PROCHLORPERAZINE MALEATE 10 MG PO TABS
10 MG | ORAL_TABLET | Freq: Four times a day (QID) | ORAL | 1 refills | Status: AC | PRN
Start: 2022-10-01 — End: 2023-10-01

## 2022-10-01 MED ORDER — SODIUM CHLORIDE 0.9 % IV SOLN
0.9 % | INTRAVENOUS | Status: AC | PRN
Start: 2022-10-01 — End: 2022-10-02
  Administered 2022-10-01: 16:00:00 30 mL/h via INTRAVENOUS

## 2022-10-01 MED ORDER — NORMAL SALINE FLUSH 0.9 % IV SOLN
0.9 % | INTRAVENOUS | Status: DC | PRN
Start: 2022-10-01 — End: 2022-10-02
  Administered 2022-10-01: 21:00:00 10 mL via INTRAVENOUS

## 2022-10-01 MED ORDER — ONDANSETRON HCL 4 MG PO TABS
4 MG | ORAL_TABLET | Freq: Three times a day (TID) | ORAL | 1 refills | Status: DC | PRN
Start: 2022-10-01 — End: 2022-12-11

## 2022-10-01 MED ORDER — NORMAL SALINE FLUSH 0.9 % IV SOLN
0.9 % | INTRAVENOUS | Status: AC | PRN
Start: 2022-10-01 — End: 2022-10-02
  Administered 2022-10-01: 15:00:00 10 mL via INTRAVENOUS

## 2022-10-01 MED FILL — ABRAXANE 100 MG IV SUSR: 100 MG | INTRAVENOUS | Qty: 38

## 2022-10-01 MED FILL — HEPARIN NA (PORK) LOCK FLSH PF 100 UNIT/ML IV SOLN: 100 UNIT/ML | INTRAVENOUS | Qty: 5

## 2022-10-01 MED FILL — ONDANSETRON HCL 4 MG/2ML IJ SOLN: 4 MG/2ML | INTRAMUSCULAR | Qty: 4

## 2022-10-01 MED FILL — SODIUM CHLORIDE 0.9 % IV SOLN: 0.9 % | INTRAVENOUS | Qty: 250

## 2022-10-01 MED FILL — CLONIDINE HCL 0.1 MG PO TABS: 0.1 MG | ORAL | Qty: 1

## 2022-10-01 MED FILL — GEMCITABINE HCL 1 GM/26.3ML IV SOLN: 1 GM/26.3ML | INTRAVENOUS | Qty: 40.03

## 2022-10-01 MED FILL — NORMAL SALINE FLUSH 0.9 % IV SOLN: 0.9 % | INTRAVENOUS | Qty: 20

## 2022-10-01 NOTE — Progress Notes (Signed)
Gabriella Blake updated on repeat Bps 184/80 HR 76 now manual. Will let Dr Ky Barban Amil know

## 2022-10-01 NOTE — Progress Notes (Signed)
Patient provided with discharge instructions, received printed AVS.  All questions answered.  Patient understands follow up plan of care.

## 2022-10-01 NOTE — Progress Notes (Signed)
Notified Gabriella Blake Alk Phos 495 AST 84 ALT 70 Gluc 285. Per Adiranna per Dr Mayra Reel ok for treatment today

## 2022-10-01 NOTE — Progress Notes (Signed)
[place]  [facility]  [place] [address]  Dept: [phone]  Loc: [phone]  Attending progress note      Reason for Visit: Pancreatic adenocarcinoma.    Referring Physician: Betsy Coder, MD [facility])    PCP:  No, Pcp    History of Present:     Gabriella Blake is a pleasant 81 year old lady, with a past medical history significant for uterine cancer, status post TAH, type II DM, hyperlipidemia, and hypertension, who was diagnosed with pancreatic cancer in June 2023, the patient received her care initially at [facility] health, CT scan of the abdomen and the pelvis from 03/22/2022 had revealed an ill-defined 3.5 x 2.5 cm hypodense mass and the uncinate process of the pancreas, hypodense focus in the inferior right lobe of the liver, she had an MRI done on 03/23/2022, revealing a hypoenhancing mass in the uncinate process/inferior head of the pancreas abutting the SMV with distortion of the SMV, less than 180 degree involvement by MR, pancreatic duct narrowing/obstruction, single hepatic lesion in the right hepatic lobe, small lymph node adjacent to the masslike area in the head of the pancreas, she had on 03/26/2022 and a US done, she was found to have a mass in the pancreatic head and uncinate process, FNA was consistent with adenocarcinoma.  The patient was started on systemic palliative therapy with gemcitabine and Abraxane on 04/27/2022, the patient had 3 staging CTs done on 07/13/2022, revealing decreased size of the pancreatic head mass, several new hypodense lesions in the liver, according to the patient, after review of her scans it was not felt that she had disease progression, gemcitabine and Abraxane were continued.  The patient had relocated to [facility] to be with her family, she had lost about 75 pounds, she has nausea, no abdominal pain at this time.  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    The patient had the biliary stent exchange on 09/28/2022 by Dr. Luz Brazen.  Today 10/01/2022 chemotherapy will be resumed.    Review of Systems;  CONSTITUTIONAL: No fever, chills.  Decreased appetite and energy level.  Positive for weight loss  ENMT: Eyes: No diplopia; Nose: No epistaxis. Mouth: No sore throat.  RESPIRATORY: No hemoptysis, shortness of breath, cough.   CARDIOVASCULAR: No chest pain, palpitations.  GASTROINTESTINAL: pos for nausea, no vomiting or abdominal pain, diarrhea/constipation.  GENITOURINARY: No dysuria, urinary frequency, hematuria.  NEURO: No syncope, presyncope, headache.  Remainder:  ROS NEGATIVE    Past Medical History:      Diagnosis Date    Diabetes mellitus ([place])     Endometrial cancer ([place])     Hyperlipidemia     Hypertension     Pancreatic cancer [facility])      Patient Active Problem List   Diagnosis    Malignant neoplasm of pancreas ([place])    Biliary stricture        Past Surgical History:      Procedure Laterality Date    CHOLECYSTECTOMY      COLONOSCOPY      ERCP N/A 09/28/2022    ERCP STENT REMOVAL/EXCHANGE performed by Georgeann Oppenheim, MD at [place]    ERCP N/A 09/28/2022    ERCP STONE REMOVAL performed by Georgeann Oppenheim, MD at [place], TOTAL ABDOMINAL (CERVIX REMOVED)  SPINE SURGERY      Plate placed in back    UPPER GASTROINTESTINAL ENDOSCOPY         Family History:  Family History   Problem Relation Age of Onset    Breast Cancer Sister 26    Cancer Sister 39        throat    Cancer Maternal Grandmother 73        unknown type       Medications:  Reviewed and reconciled.    Social History:  Social History     Socioeconomic History    Marital status: Unknown     Spouse name: Not on file    Number of children: Not on file    Years of education: Not on file    Highest education level: Not on file   Occupational History    Not on file   Tobacco Use    Smoking status: Never     Smokeless tobacco: Never   Vaping Use    Vaping Use: Never used   Substance and Sexual Activity    Alcohol use: Yes     Comment: occassionaly    Drug use: Never    Sexual activity: Not on file   Other Topics Concern    Not on file   Social History Narrative    Not on file     Social Determinants of Health     Financial Resource Strain: Not on file   Food Insecurity: Not on file   Transportation Needs: Not on file   Physical Activity: Not on file   Stress: Not on file   Social Connections: Not on file   Intimate Partner Violence: Not on file   Housing Stability: Not on file       Allergies:  Allergies   Allergen Reactions    Molds & Smuts     Nicotine     Shellfish Allergy      Throat seemed to close       Physical Exam:  BP (!) 158/80   Pulse 75   Temp 97.3 F (36.3 C)   Ht 1.676 m ('5\' 6"'$ )   Wt 86.5 kg (190 lb 12.8 oz)   SpO2 98%   BMI 30.80 kg/m   GENERAL: Alert, oriented x 3, not in acute distress  HEENT: PERRLA; EOMI. Oropharynx clear.   NECK: Supple. No palpable cervical or supraclavicular lymphadenopathy.   LUNGS: Good air entry bilaterally. No wheezing, crackles or rhonchi.   CARDIOVASCULAR: Regular rate. No murmurs, rubs or gallops.   ABDOMEN: Soft. Non-tender, non-distended. Positive bowel sounds.  EXTREMITIES: Without clubbing, cyanosis, or edema.   NEUROLOGIC: No focal deficits.   ECOG PS 1    Impression/Plan:     Gabriella Blake is a pleasant 81 year old lady, with a past medical history significant for uterine cancer, status post TAH, type II DM, hyperlipidemia, and hypertension, who was diagnosed with pancreatic cancer in June 2023, the patient received her care initially at [facility] health, CT scan of the abdomen and the pelvis from 03/22/2022 had revealed an ill-defined 3.5 x 2.5 cm hypodense mass and the uncinate process of the pancreas, hypodense focus in the inferior right lobe of the liver, she had an MRI done on 03/23/2022, revealing a hypoenhancing mass in the uncinate process/inferior head of the  pancreas abutting the SMV with distortion of the SMV, less than 180 degree involvement by MR, pancreatic duct narrowing/obstruction, single hepatic lesion in the right hepatic lobe, small lymph node  adjacent to the masslike area in the head of the pancreas, she had on 03/26/2022 and a US done, she was found to have a mass in the pancreatic head and uncinate process, FNA was consistent with adenocarcinoma.  The patient was started on systemic palliative therapy with gemcitabine and Abraxane on 04/27/2022, the patient had 3 staging CTs done on 07/13/2022, revealing decreased size of the pancreatic head mass, several new hypodense lesions in the liver, according to the patient, after review of her scans it was not felt that she had disease progression, gemcitabine and Abraxane were continued.  The patient had relocated to [facility] to be with her family, she had lost about 75 pounds, she has nausea, no abdominal pain at this time.  The patient had bili obstruction and an ERCP done on 05/07/2022, with placement of covered metal stent, was found to have ulcerative tumor invading the duodenum,  The patient had the biliary stent exchange on 09/28/2022 by Dr. Luz Brazen.      Records from [facility] were reviewed, discussed with the patient her diagnosis, and prognosis, recommended restating CT scans of the chest, abdomen, and pelvis, were done on 09/23/2022, which I had reviewed, she has a small 2 to 3 mm noncalcified nodules in the right upper and right mid lung, nonspecific, ill-defined fullness of the pancreatic head around 2 cm, hypoenhancing focus concerning for underlying malignancy, surrounding adenopathy without metastatic disease otherwise noted.  Today 10/01/2022, chemotherapy will be resumed, labs reviewed, blood counts are adequate for treatment, proceed with the gemcitabine and Abraxane.    RTC in 1 week.    Thank you for allowing [place] to participate in the care of Gabriella Blake.    Freda Munro, MD   HEMATOLOGY/MEDICAL  [place]  [facility]  [address]  [place] [address]  Dept: [place]: [phone]

## 2022-10-01 NOTE — Progress Notes (Signed)
BP remains high-see flowsheet clonidine x1  administered per R El Amil orders Asymptomatic-denise headache sob chest pain or dizziness

## 2022-10-01 NOTE — Progress Notes (Signed)
Patient tolerated chemo infusions well. Vital signs stable, last BP 180/78, Dr. Ky Barban Amil aware and ok'd discharge. Patient is asymptomatic. Patient will take BP medicines as soon as she gets home. Discharged by wheelchair with daughter.

## 2022-10-01 NOTE — Progress Notes (Signed)
Notified Adrianna Millikin RN of hypertension -see flowsheet. Patient did not take her lisinopri or carvedilol this morning.  Patient is asymptomatic. Instructed to take her home meds as ordered and monitor BP at home.

## 2022-10-01 NOTE — Telephone Encounter (Signed)
Spoke with Gabriella Blake and her sister Loraine at the infusion center. Explained Palliative Care and location of clinic. [place] shares that she had Palliative Care in [place]. She moved up here to be closer to family. App set 11/01/22. Placed app on wait list.

## 2022-10-05 ENCOUNTER — Other Ambulatory Visit (HOSPITAL_BASED_OUTPATIENT_CLINIC_OR_DEPARTMENT_OTHER): Payer: Self-pay

## 2022-10-05 MED ORDER — CARVEDILOL 25 MG PO TABS
50.0000 mg | ORAL_TABLET | Freq: Two times a day (BID) | ORAL | 2 refills | Status: AC
  Filled 2022-10-05: qty 120, 30d supply, fill #0

## 2022-10-07 ENCOUNTER — Inpatient Hospital Stay: Admit: 2022-10-07 | Discharge: 2022-10-07 | Payer: MEDICARE

## 2022-10-07 DIAGNOSIS — C259 Malignant neoplasm of pancreas, unspecified: Secondary | ICD-10-CM

## 2022-10-07 DIAGNOSIS — Z452 Encounter for adjustment and management of vascular access device: Secondary | ICD-10-CM

## 2022-10-07 LAB — CBC WITH AUTO DIFFERENTIAL
Absolute Immature Granulocyte: <0.03 10*3/uL (ref 0.00–0.58)
Basophils %: 0 % (ref 0.0–2.0)
Basophils Absolute: 0.01 10*3/uL (ref 0.00–0.20)
Eosinophils %: 2 % (ref 0–6)
Eosinophils Absolute: 0.08 10*3/uL (ref 0.05–0.50)
Hematocrit: 27.3 % — ABNORMAL LOW (ref 34.0–48.0)
Hemoglobin: 8.6 g/dL — ABNORMAL LOW (ref 11.5–15.5)
Immature Granulocytes: 1 % (ref 0.0–5.0)
Lymphocytes %: 25 % (ref 20.0–42.0)
Lymphocytes Absolute: 0.9 10*3/uL — ABNORMAL LOW (ref 1.50–4.00)
MCH: 27.7 pg (ref 26.0–35.0)
MCHC: 31.5 g/dL — ABNORMAL LOW (ref 32.0–34.5)
MCV: 88.1 fL (ref 80.0–99.9)
MPV: 10.9 fL (ref 7.0–12.0)
Monocytes %: 5 % (ref 2.0–12.0)
Monocytes Absolute: 0.18 10*3/uL (ref 0.10–0.95)
Neutrophils %: 67 % (ref 43.0–80.0)
Neutrophils Absolute: 2.42 10*3/uL (ref 1.80–7.30)
Platelets: 107 10*3/uL — ABNORMAL LOW (ref 130–450)
RBC: 3.1 m/uL — ABNORMAL LOW (ref 3.50–5.50)
RDW: 15.5 % — ABNORMAL HIGH (ref 11.5–15.0)
WBC: 3.6 10*3/uL — ABNORMAL LOW (ref 4.5–11.5)

## 2022-10-07 LAB — COMPREHENSIVE METABOLIC PANEL
ALT: 64 U/L — ABNORMAL HIGH (ref 0–32)
AST: 83 U/L — ABNORMAL HIGH (ref 0–31)
Albumin: 2.8 g/dL — ABNORMAL LOW (ref 3.5–5.2)
Alkaline Phosphatase: 486 U/L — ABNORMAL HIGH (ref 35–104)
Anion Gap: 12 mmol/L (ref 7–16)
BUN: 7 mg/dL (ref 6–23)
CO2: 25 mmol/L (ref 22–29)
Calcium: 9.2 mg/dL (ref 8.6–10.2)
Chloride: 97 mmol/L — ABNORMAL LOW (ref 98–107)
Creatinine: 0.5 mg/dL (ref 0.50–1.00)
Est, Glom Filt Rate: >60 mL/min/{1.73_m2} (ref >60)
Glucose: 194 mg/dL — ABNORMAL HIGH (ref 74–99)
Potassium: 4.1 mmol/L (ref 3.5–5.0)
Sodium: 134 mmol/L (ref 132–146)
Total Bilirubin: 0.8 mg/dL (ref 0.0–1.2)
Total Protein: 7 g/dL (ref 6.4–8.3)

## 2022-10-07 MED ORDER — NORMAL SALINE FLUSH 0.9 % IV SOLN
0.9 % | INTRAVENOUS | Status: DC | PRN
Start: 2022-10-07 — End: 2022-10-08
  Administered 2022-10-07: 19:00:00 10 mL via INTRAVENOUS

## 2022-10-07 MED ORDER — HEPARIN NA (PORK) LOCK FLSH PF 100 UNIT/ML IV SOLN
100 UNIT/ML | INTRAVENOUS | Status: DC | PRN
Start: 2022-10-07 — End: 2022-10-08
  Administered 2022-10-07: 19:00:00 500 [IU]

## 2022-10-08 ENCOUNTER — Ambulatory Visit: Admit: 2022-10-08 | Discharge: 2022-10-08 | Payer: MEDICARE | Attending: Registered Nurse

## 2022-10-08 DIAGNOSIS — Z5111 Encounter for antineoplastic chemotherapy: Secondary | ICD-10-CM

## 2022-10-08 DIAGNOSIS — C259 Malignant neoplasm of pancreas, unspecified: Secondary | ICD-10-CM

## 2022-10-08 DIAGNOSIS — Z5112 Encounter for antineoplastic immunotherapy: Secondary | ICD-10-CM | POA: Diagnosis not present

## 2022-10-08 MED ORDER — CLONIDINE HCL 0.1 MG PO TABS
0.1 MG | Freq: Once | ORAL | Status: AC
Start: 2022-10-08 — End: 2022-10-08
  Administered 2022-10-08: 15:00:00 0.1 mg via ORAL

## 2022-10-08 MED ORDER — ACETAMINOPHEN 325 MG PO TABS
325 MG | ORAL | Status: DC | PRN
Start: 2022-10-08 — End: 2022-10-09
  Administered 2022-10-08: 17:00:00 650 mg via ORAL

## 2022-10-08 MED ORDER — NORMAL SALINE FLUSH 0.9 % IV SOLN
0.9 % | INTRAVENOUS | Status: DC | PRN
Start: 2022-10-08 — End: 2022-10-09
  Administered 2022-10-08 (×2): 10 mL via INTRAVENOUS

## 2022-10-08 MED ORDER — HEPARIN NA (PORK) LOCK FLSH PF 100 UNIT/ML IV SOLN
100 UNIT/ML | INTRAVENOUS | Status: DC | PRN
Start: 2022-10-08 — End: 2022-10-09
  Administered 2022-10-08: 17:00:00 500 [IU]

## 2022-10-08 MED ORDER — SODIUM CHLORIDE 0.9 % IV SOLN
0.9 % | Freq: Once | INTRAVENOUS | Status: AC
Start: 2022-10-08 — End: 2022-10-08
  Administered 2022-10-08: 16:00:00 1522 mg/m2 via INTRAVENOUS

## 2022-10-08 MED ORDER — PACLITAXEL PROTEIN-BOUND PART 100 MG IV SUSR
100 MG | Freq: Once | INTRAVENOUS | Status: AC
Start: 2022-10-08 — End: 2022-10-08
  Administered 2022-10-08: 15:00:00 190 mg via INTRAVENOUS

## 2022-10-08 MED ORDER — SODIUM CHLORIDE 0.9 % IV SOLN
0.9 % | INTRAVENOUS | Status: DC | PRN
Start: 2022-10-08 — End: 2022-10-09
  Administered 2022-10-08: 15:00:00 100 mL/h via INTRAVENOUS

## 2022-10-08 MED ORDER — ONDANSETRON HCL 4 MG/2ML IJ SOLN
4 MG/2ML | Freq: Once | INTRAMUSCULAR | Status: AC
Start: 2022-10-08 — End: 2022-10-08
  Administered 2022-10-08: 15:00:00 8 mg via INTRAVENOUS

## 2022-10-08 MED FILL — GEMCITABINE HCL 1 GM/26.3ML IV SOLN: 1 GM/26.3ML | INTRAVENOUS | Qty: 40.03

## 2022-10-08 MED FILL — ONDANSETRON HCL 4 MG/2ML IJ SOLN: 4 MG/2ML | INTRAMUSCULAR | Qty: 4

## 2022-10-08 MED FILL — PAIN & FEVER 325 MG PO TABS: 325 MG | ORAL | Qty: 2

## 2022-10-08 MED FILL — ABRAXANE 100 MG IV SUSR: 100 MG | INTRAVENOUS | Qty: 38

## 2022-10-08 MED FILL — CLONIDINE HCL 0.1 MG PO TABS: 0.1 MG | ORAL | Qty: 1

## 2022-10-08 MED FILL — SODIUM CHLORIDE 0.9 % IV SOLN: 0.9 % | INTRAVENOUS | Qty: 250

## 2022-10-08 NOTE — Progress Notes (Signed)
ONCOLOGY  NP VISIT         10/01/2022      NAME:  Gabriella Blake    DATE OF BIRTH:  10/19/1940      ALLERGIES:  Molds & smuts, Nicotine, and Shellfish allergy         Current Outpatient Medications   Medication Sig Dispense Refill    prochlorperazine (COMPAZINE) 10 MG tablet Take 1 tablet by mouth every 6 hours as needed (n/v) 60 tablet 1    ondansetron (ZOFRAN) 4 MG tablet Take 2 tablets by mouth every 8 hours as needed for Nausea or Vomiting 60 tablet 1    potassium chloride (KLOR-CON M) 20 MEQ extended release tablet Take 1 tablet by mouth 2 times daily 28 tablet 0    carvedilol (COREG) 25 MG tablet Take 1 tablet by mouth 2 times daily      UNIFINE PENTIPS 31G X 5 MM MISC       CREON 36000-114000 units CPEP delayed release capsule Take by mouth 3 times daily (with meals)      potassium chloride (KLOR-CON M) 20 MEQ extended release tablet Take by mouth daily      glipiZIDE (GLUCOTROL) 5 MG tablet Take 2 tablets by mouth 2 times daily (before meals)      traMADol (ULTRAM) 50 MG tablet       lisinopril (PRINIVIL;ZESTRIL) 20 MG tablet Take 1 tablet by mouth daily      magnesium 30 MG tablet Take 1 tablet by mouth daily      b complex vitamins capsule Take 1 capsule by mouth daily      Calcium Carb-Vit D-C-E-Mineral (OS-CAL ULTRA) 600 MG TABS Take by mouth      Cholecalciferol (VITAMIN D) 50 MCG (2000 UT) CAPS capsule Take by mouth      doxazosin (CARDURA) 4 MG tablet Take 1 tablet by mouth nightly       No current facility-administered medications for this visit.     Facility-Administered Medications Ordered in Other Visits   Medication Dose Route Frequency Provider Last Rate Last Admin    sodium chloride flush 0.9 % injection 5-40 mL  5-40 mL IntraVENous PRN Jani Gravel, MD   10 mL at 10/01/22 0940    heparin (PF) 100 UNIT/ML injection 500 Units  500 Units IntraCATHeter PRN Jani Gravel, MD        0.9 % sodium chloride infusion  5-250 mL/hr IntraVENous PRN El Amil, Vernon Prey, MD 30 mL/hr at 10/01/22 1127 30 mL/hr at  10/01/22 1127    sodium chloride flush 0.9 % injection 5-40 mL  5-40 mL IntraVENous PRN El Amil, Vernon Prey, MD                 SUBJECTIVE:  Here for chemotherapy and follow up.   Has discomfort Right back radiates to right sided abdomen.. Has pain medication from [facility], waiting for appointment at [place].        OBJECTIVE:   BP (!) 178/84   Pulse 70   Temp 98.3 F (36.8 C)   Ht 1.676 m ('5\' 6"'$ )   Wt 85.7 kg (189 lb)   SpO2 98%   BMI 30.51 kg/m    Physical Examination:  Performance Status 0  GENERAL: Alert, oriented x 3, in no acute distress.  HEENT: PERRLA; EOMI. Oropharynx clear.   NECK: Supple.   HEART:  Regular rate and rhythm. . No murmurs.  LUNGS: Clear bilaterally. No wheezing.  ABDOMEN:  Soft tender, RLQ  non-distended. Positive bowel sounds.  EXTREMITIES: Without clubbing, cyanosis, or edema.   NEUROLOGIC: No focal deficits.   MEDI-PORT:  Right  Skin:  Dry,  with raised pimples left mandibular angle and on midline below breasts.      LAB RESULTS:    Lab Results   Component Value Date    WBC 3.6 (L) 10/07/2022    HGB 8.6 (L) 10/07/2022    HCT 27.3 (L) 10/07/2022    MCV 88.1 10/07/2022    PLT 107 (L) 10/07/2022     Lab Results   Component Value Date    ALT 64 (H) 10/07/2022    AST 83 (H) 10/07/2022    ALKPHOS 486 (H) 10/07/2022    BILITOT 0.8 10/07/2022     Lab Results   Component Value Date    LABALBU 2.8 (L) 10/07/2022       Recent Labs     10/07/22  1336   NA 134   CO2 25   BUN 7   CREATININE 0.5   K+                        4.1  ANC                    2.42            ASSESSMENT/PLAN:   An  81 year old lady, with a past medical history significant for uterine cancer, status post TAH, type II DM, hyperlipidemia, and hypertension, who was diagnosed with pancreatic cancer in June 2023, the patient received her care initially at [facility] health, CT scan of the abdomen and the pelvis from 03/22/2022 had revealed an ill-defined 3.5 x 2.5 cm hypodense mass and the uncinate process of  the pancreas, hypodense focus in the inferior right lobe of the liver, she had an MRI done on 03/23/2022, revealing a hypoenhancing mass in the uncinate process/inferior head of the pancreas abutting the SMV with distortion of the SMV, less than 180 degree involvement by MR, pancreatic duct narrowing/obstruction, single hepatic lesion in the right hepatic lobe, small lymph node adjacent to the masslike area in the head of the pancreas, she had on 03/26/2022 and a US done, she was found to have a mass in the pancreatic head and uncinate process, FNA was consistent with adenocarcinoma.  The patient was started on systemic palliative therapy with gemcitabine and Abraxane on 04/27/2022, the patient had 3 staging CTs done on 07/13/2022, revealing decreased size of the pancreatic head mass, several new hypodense lesions in the liver, according to the patient, after review of her scans it was not felt that she had disease progression, gemcitabine and Abraxane were continued.  The patient had relocated to [facility] to be with her family, she had lost about 75 pounds, she has nausea, no abdominal pain at this time.  The patient had bili obstruction and an ERCP done on 05/07/2022, with placement of covered metal stent, was found to have ulcerative tumor invading the duodenum,  she is due to have the biliary stent changed.  Records from [facility] were reviewed, discussed with the patient her diagnosis, and prognosis, recommended restating CT scans of the chest, abdomen, and pelvis, if there is no evidence of disease progression, we will continue with gemcitabine and Abraxane as palliative therapy, labs were ordered, including CBCD, CMP and CA 19-9, will refer to palliative medicine for symptoms management, will also refer to GI to have  the stent changed.     RTC in 1 to 2 weeks.      10/08/2022  To receive Day #8 Cycle #1 Abraxane and Gemzar today.  Lab work reviewed, okay to proceed with chemo treatment.        To return   10/15/2022  for OV with Dr. Cline Crock,  Day #15 Abraxane/Gemzar.  Labs prior.            [place], [place]:  [phone]

## 2022-10-08 NOTE — Progress Notes (Signed)
Patient provided with discharge instructions.  All questions answered.  Patient understands follow up plan of care.

## 2022-10-08 NOTE — Progress Notes (Signed)
Patient seen in clinic. Per Dwyane Luo, NP, patient to take catapres prior to [facility]. Order placed. Infusion RN aware. BP 178/84

## 2022-10-08 NOTE — Progress Notes (Signed)
Patient tolerated chemo infusions well. Vital signs stable, though BP elevated. Elsie Amis, NP, ok'd discharge, patient asymptomatic. Discharged by wheelchair with daughter.

## 2022-10-12 ENCOUNTER — Emergency Department: Admit: 2022-10-13 | Payer: MEDICARE

## 2022-10-12 DIAGNOSIS — U071 COVID-19: Secondary | ICD-10-CM

## 2022-10-12 DIAGNOSIS — D709 Neutropenia, unspecified: Secondary | ICD-10-CM | POA: Diagnosis not present

## 2022-10-12 DIAGNOSIS — I10 Essential (primary) hypertension: Secondary | ICD-10-CM | POA: Diagnosis not present

## 2022-10-12 DIAGNOSIS — D696 Thrombocytopenia, unspecified: Secondary | ICD-10-CM | POA: Diagnosis not present

## 2022-10-12 DIAGNOSIS — D61810 Antineoplastic chemotherapy induced pancytopenia: Secondary | ICD-10-CM | POA: Diagnosis not present

## 2022-10-12 DIAGNOSIS — C259 Malignant neoplasm of pancreas, unspecified: Secondary | ICD-10-CM | POA: Diagnosis not present

## 2022-10-12 DIAGNOSIS — D649 Anemia, unspecified: Secondary | ICD-10-CM | POA: Diagnosis not present

## 2022-10-12 DIAGNOSIS — E785 Hyperlipidemia, unspecified: Secondary | ICD-10-CM | POA: Diagnosis not present

## 2022-10-12 DIAGNOSIS — D701 Agranulocytosis secondary to cancer chemotherapy: Secondary | ICD-10-CM | POA: Diagnosis not present

## 2022-10-12 DIAGNOSIS — E119 Type 2 diabetes mellitus without complications: Secondary | ICD-10-CM | POA: Diagnosis not present

## 2022-10-12 DIAGNOSIS — R0602 Shortness of breath: Secondary | ICD-10-CM | POA: Diagnosis not present

## 2022-10-12 DIAGNOSIS — R079 Chest pain, unspecified: Secondary | ICD-10-CM | POA: Diagnosis not present

## 2022-10-12 DIAGNOSIS — E871 Hypo-osmolality and hyponatremia: Secondary | ICD-10-CM | POA: Diagnosis not present

## 2022-10-12 NOTE — ED Provider Notes (Incomplete)
[facility] EMERGENCY DEPARTMENT  EMERGENCY DEPARTMENT ENCOUNTER        Pt Name: Gabriella Blake  MRN: [id number]  Birthdate 09-Jun-1941  Date of evaluation: 10/12/2022  Provider: Brooke Dare, DO  PCP: No, Pcp  Note Started: 9:28 PM EST 10/12/22    CHIEF COMPLAINT       Chief Complaint   Patient presents with    Positive For Covid-19     C/O sob, diarrhea, nausea and abdominal pain, positive covid test today, symptoms starting friday       HISTORY OF PRESENT ILLNESS: 1 or more Elements   History From: Patient    Limitations to history : None    Gabriella Blake is a 82 y.o. female who presents to the emergency department for chest pain shortness of breath nausea vomiting and diarrhea.  The patient states that she has pancreatic cancer and is undergoing chemotherapy.  Patient's last chemotherapy was on Friday.  She states that over the weekend the symptoms started and she thought it was related to her chemotherapy.  She states that today she took a home COVID test which was positive therefore she came to the ED to be evaluated.    Nursing Notes were all reviewed and agreed with or any disagreements were addressed in the HPI.      REVIEW OF EXTERNAL NOTE :       PDMP Monitoring:    Last PDMP Mark as Reviewed:  Review User Review Instant Review Result            Urine Drug Screenings (1 yr)    No resulted procedures found.       Medication Contract and Consent for Opioid Use Documents Filed        No documents found                      REVIEW OF SYSTEMS :           Positives and Pertinent negatives as per HPI.     SURGICAL HISTORY     Past Surgical History:   Procedure Laterality Date    CHOLECYSTECTOMY      COLONOSCOPY      ERCP N/A 09/28/2022    ERCP STENT REMOVAL/EXCHANGE performed by Deborah Chalk, MD at [facility] ENDOSCOPY    ERCP N/A 09/28/2022    ERCP STONE REMOVAL performed by Deborah Chalk, MD at [facility] ENDOSCOPY    HYSTERECTOMY, TOTAL ABDOMINAL (CERVIX REMOVED)      SPINE  SURGERY      Plate placed in back    UPPER GASTROINTESTINAL ENDOSCOPY         CURRENTMEDICATIONS       Previous Medications    B COMPLEX VITAMINS CAPSULE    Take 1 capsule by mouth daily    CALCIUM CARB-VIT D-C-E-MINERAL (OS-CAL ULTRA) 600 MG TABS    Take by mouth    CARVEDILOL (COREG) 25 MG TABLET    Take 1 tablet by mouth 2 times daily    CHOLECALCIFEROL (VITAMIN D) 50 MCG (2000 UT) CAPS CAPSULE    Take by mouth    CREON 36000-114000 UNITS CPEP DELAYED RELEASE CAPSULE    Take by mouth 3 times daily (with meals)    DOXAZOSIN (CARDURA) 4 MG TABLET    Take 1 tablet by mouth nightly    GLIPIZIDE (GLUCOTROL) 5 MG TABLET    Take 2 tablets by mouth 2 times daily (before meals)  LISINOPRIL (PRINIVIL;ZESTRIL) 20 MG TABLET    Take 1 tablet by mouth daily    MAGNESIUM 30 MG TABLET    Take 1 tablet by mouth daily    ONDANSETRON (ZOFRAN) 4 MG TABLET    Take 2 tablets by mouth every 8 hours as needed for Nausea or Vomiting    POTASSIUM CHLORIDE (KLOR-CON M) 20 MEQ EXTENDED RELEASE TABLET    Take by mouth daily    POTASSIUM CHLORIDE (KLOR-CON M) 20 MEQ EXTENDED RELEASE TABLET    Take 1 tablet by mouth 2 times daily    PROCHLORPERAZINE (COMPAZINE) 10 MG TABLET    Take 1 tablet by mouth every 6 hours as needed (n/v)    TRAMADOL (ULTRAM) 50 MG TABLET        UNIFINE PENTIPS 31G X 5 MM MISC           ALLERGIES     Molds & smuts, Nicotine, and Shellfish allergy    FAMILYHISTORY       Family History   Problem Relation Age of Onset    Breast Cancer Sister 34    Cancer Sister 81        throat    Cancer Maternal Grandmother 46        unknown type        SOCIAL HISTORY       Social History     Tobacco Use    Smoking status: Never    Smokeless tobacco: Never   Vaping Use    Vaping Use: Never used   Substance Use Topics    Alcohol use: Yes     Comment: occassionaly    Drug use: Never       SCREENINGS        Glasgow Coma Scale  Eye Opening: Spontaneous  Best Verbal Response: Oriented  Best Motor Response: Obeys commands  Glasgow Coma Scale  Score: 15                CIWA Assessment  BP: 139/69  Pulse: 76           PHYSICAL EXAM  1 or more Elements     ED Triage Vitals   BP Temp Temp Source Pulse Respirations SpO2 Height Weight - Scale   10/12/22 2108 10/12/22 2058 10/12/22 2058 10/12/22 2058 10/12/22 2108 10/12/22 2058 10/12/22 2058 10/12/22 2106   139/69 97.2 F (36.2 C) Temporal 78 20 95 % 1.676 m (5\' 6" ) 85.7 kg (189 lb)                Constitutional/General: Alert and oriented x3, appears chronically ill  Head: Normocephalic and atraumatic  Eyes: PERRL, EOMI, conjunctiva normal, sclera non icteric  ENT:  Oropharynx clear, handling secretions, no trismus, no asymmetry of the posterior oropharynx or uvular edema  Neck: Supple, full ROM, no stridor, no meningeal signs  Respiratory: Lungs clear to auscultation bilaterally, no wheezes, rales, or rhonchi. Not in respiratory distress  Cardiovascular:  Regular rate. Regular rhythm. No murmurs, no gallops, no rubs. 2+ distal pulses. Equal extremity pulses.   GI:  Abdomen Soft, mild epigastric tenderness non distended.  No rebound, guarding, or rigidity. No pulsatile masses.  Musculoskeletal: Moves all extremities x 4. Warm and well perfused, no clubbing, no cyanosis, no edema. Capillary refill <3 seconds  Integument: skin warm and dry. No rashes.   Neurologic: GCS 15, no focal deficits, symmetric strength 5/5 in the upper and lower extremities bilaterally  Psychiatric: Normal Affect  DIAGNOSTIC RESULTS   LABS:    Labs Reviewed   COVID-19 & INFLUENZA COMBO   CBC WITH AUTO DIFFERENTIAL   COMPREHENSIVE METABOLIC PANEL   LIPASE   TROPONIN       As interpreted by me, the above displayed labs are abnormal. All other labs obtained during this visit were within normal range or not returned as of this dictation.      EKG Interpretation  Interpreted by emergency department physician, Brooke Dare, DO      EKG shows normal sinus rhythm 74 bpm.  Normal axis.  LVH present.  No STEMI.        RADIOLOGY:    Non-plain film images such as CT, Ultrasound and MRI are read by the radiologist. Plain radiographic images are visualized and preliminarily interpreted by the ED Provider with the below findings:    ***    Interpretation per the Radiologist below, if available at the time of this note:    XR CHEST PORTABLE    (Results Pending)     No results found.    No results found.    PROCEDURES   Unless otherwise noted below, none          CRITICAL CARE TIME (.cct)       PAST MEDICAL HISTORY/Chronic Conditions Affecting Care      has a past medical history of Diabetes mellitus (HCC), Endometrial cancer (HCC), Hyperlipidemia, Hypertension, and Pancreatic cancer (HCC).     EMERGENCY DEPARTMENT COURSE    Vitals:    Vitals:    10/12/22 2058 10/12/22 2106 10/12/22 2108   BP:   139/69   Pulse: 78  76   Resp:   20   Temp: 97.2 F (36.2 C)     TempSrc: Temporal     SpO2: 95%  98%   Weight:  85.7 kg (189 lb)    Height: 1.676 m (5\' 6" ) 1.676 m (5\' 6" )        Patient was given the following medications:  Medications   sodium chloride 0.9 % bolus 1,000 mL (has no administration in time range)   ondansetron (ZOFRAN) injection 4 mg (has no administration in time range)             Medical Decision Making/Differential Diagnosis:    CC/HPI Summary, Social Determinants of health, Records Reviewed, DDx, testing done/not done, ED Course, Reassessment, disposition considerations/shared decision making with patient, consults, disposition:            Medical Decision Making  Amount and/or Complexity of Data Reviewed  Labs: ordered.  Radiology: ordered.  ECG/medicine tests: ordered.    Risk  Prescription drug management.        This is an 82 year old female presented to the ED for nausea vomiting diarrhea chest pain and shortness of breath.  Upon arrival to the ED patient's vital signs are stable.  On exam patient is in no acute distress.  Clear to auscultation.  Mild epigastric tenderness.  Differential diagnosis includes but is not limited to  viral illness, dehydration, pneumonia, sepsis, pancreatitis, dehydration.  Patient undergo laboratory workup with CBC CMP lipase troponin EKG chest x-ray COVID test flu test.  Patient started on IV fluids and IV Zofran    CONSULTS: (Who and What was discussed)  None        I am the Primary Clinician of Record.    FINAL IMPRESSION    No diagnosis found.      DISPOSITION/PLAN     DISPOSITION  PATIENT REFERRED TO:  No follow-up provider specified.    DISCHARGE MEDICATIONS:  New Prescriptions    No medications on file       DISCONTINUED MEDICATIONS:  Discontinued Medications    No medications on file              (Please note that portions of this note were completed with a voice recognition program.  Efforts were made to edit the dictations but occasionally words are mis-transcribed.)    Brooke Dare, DO (electronically signed)

## 2022-10-12 NOTE — ED Notes (Signed)
Department of Emergency Medicine  FIRST PROVIDER TRIAGE NOTE             Independent MLP           10/12/22  9:06 PM EST    Date of Encounter: 10/12/22   MRN: [id number]      HPI: Gabriella Blake is a 82 y.o. female who presents to the ED for No chief complaint on file.     HAS BEEN FEELING SICK SINCE FRIDAY.  TESTED POSITIVE FOR COVID.  NOW HAS NAUSEA, VOMITING, DIARRHEA AND ABDOMINAL/BACK PAIN.     ROS: Negative for rash or headache.    PE: Gen Appearance/Constitutional: alert  HEENT: NC/NT. PERRLA,  Airway patent.     Initial Plan of Care: All treatment areas with department are currently occupied. Plan to order/Initiate the following while awaiting opening in ED.  Initiate Treatment-Testing, Proceed toTreatment Area When Bed Available for ED Attending/MLP to Continue Care    Electronically signed by Dorothyann Peng, APRN - CNP   DD: 10/12/22      Dorothyann Peng, APRN - CNP  10/12/22 2106

## 2022-10-12 NOTE — Telephone Encounter (Signed)
SW contacted pt re: positive distress screen.  Pt was difficult to hear on the phone and will be coming into office Friday 10/15/22.  SW to be in office that day and follow up with pt in person.  Pt was agreeable and reported understanding.  Pt was encouraged to reach out to this provider should any needs arise.        Cresenciano Genre MSW, LISW-S  Oncology Social Worker

## 2022-10-13 ENCOUNTER — Inpatient Hospital Stay
Admit: 2022-10-13 | Discharge: 2022-10-13 | Discharge status: Against Medical Advice | Payer: MEDICARE | Attending: Internal Medicine | Admitting: Internal Medicine

## 2022-10-13 DIAGNOSIS — U071 COVID-19: Secondary | ICD-10-CM

## 2022-10-13 LAB — CBC WITH AUTO DIFFERENTIAL
Basophils %: 0 % (ref 0.0–2.0)
Basophils %: 0 % (ref 0.0–2.0)
Basophils Absolute: 0 10*3/uL (ref 0.00–0.20)
Basophils Absolute: 0 10*3/uL (ref 0.00–0.20)
Eosinophils %: 3 % (ref 0–6)
Eosinophils %: 0 % (ref 0–6)
Eosinophils Absolute: 0.02 10*3/uL — ABNORMAL LOW (ref 0.05–0.50)
Eosinophils Absolute: 0 10*3/uL — ABNORMAL LOW (ref 0.05–0.50)
Hematocrit: 27.3 % — ABNORMAL LOW (ref 34.0–48.0)
Hematocrit: 21.6 % — ABNORMAL LOW (ref 34.0–48.0)
Hemoglobin: 8.6 g/dL — ABNORMAL LOW (ref 11.5–15.5)
Hemoglobin: 6.9 g/dL — CL (ref 11.5–15.5)
Lymphocytes %: 36 % (ref 20.0–42.0)
Lymphocytes %: 13 % — ABNORMAL LOW (ref 20.0–42.0)
Lymphocytes Absolute: 0.32 10*3/uL — ABNORMAL LOW (ref 1.50–4.00)
Lymphocytes Absolute: 0.16 10*3/uL — ABNORMAL LOW (ref 1.50–4.00)
MCH: 27.1 pg (ref 26.0–35.0)
MCH: 27.9 pg (ref 26.0–35.0)
MCHC: 31.5 g/dL — ABNORMAL LOW (ref 32.0–34.5)
MCHC: 31.9 g/dL — ABNORMAL LOW (ref 32.0–34.5)
MCV: 86.1 fL (ref 80.0–99.9)
MCV: 87.4 fL (ref 80.0–99.9)
MPV: 11.1 fL (ref 7.0–12.0)
MPV: 10.8 fL (ref 7.0–12.0)
Monocytes %: 3 % (ref 2.0–12.0)
Monocytes %: 4 % (ref 2.0–12.0)
Monocytes Absolute: 0.02 10*3/uL — ABNORMAL LOW (ref 0.10–0.95)
Monocytes Absolute: 0.04 10*3/uL — ABNORMAL LOW (ref 0.10–0.95)
Myelocytes Absolute: 0.01 10*3/uL — ABNORMAL HIGH
Myelocytes: 1 % — ABNORMAL HIGH
Neutrophils %: 58 % (ref 43.0–80.0)
Neutrophils %: 84 % — ABNORMAL HIGH (ref 43.0–80.0)
Neutrophils Absolute: 0.52 10*3/uL — ABNORMAL LOW (ref 1.80–7.30)
Neutrophils Absolute: 1 10*3/uL — ABNORMAL LOW (ref 1.80–7.30)
Platelets: 99 10*3/uL — ABNORMAL LOW (ref 130–450)
Platelets: 71 10*3/uL — ABNORMAL LOW (ref 130–450)
RBC: 3.17 m/uL — ABNORMAL LOW (ref 3.50–5.50)
RBC: 2.47 m/uL — ABNORMAL LOW (ref 3.50–5.50)
RDW: 14.8 % (ref 11.5–15.0)
RDW: 15 % (ref 11.5–15.0)
WBC: 0.9 10*3/uL — CL (ref 4.5–11.5)
WBC: 1.2 10*3/uL — ABNORMAL LOW (ref 4.5–11.5)
nRBC: 1 per 100 WBC

## 2022-10-13 LAB — COMPREHENSIVE METABOLIC PANEL W/ REFLEX TO MG FOR LOW K
ALT: 53 U/L — ABNORMAL HIGH (ref 0–32)
AST: 87 U/L — ABNORMAL HIGH (ref 0–31)
Albumin: 2.3 g/dL — ABNORMAL LOW (ref 3.5–5.2)
Alkaline Phosphatase: 248 U/L — ABNORMAL HIGH (ref 35–104)
Anion Gap: 10 mmol/L (ref 7–16)
BUN: 5 mg/dL — ABNORMAL LOW (ref 6–23)
CO2: 23 mmol/L (ref 22–29)
Calcium: 8.3 mg/dL — ABNORMAL LOW (ref 8.6–10.2)
Chloride: 100 mmol/L (ref 98–107)
Creatinine: 0.4 mg/dL — ABNORMAL LOW (ref 0.50–1.00)
Est, Glom Filt Rate: >60 mL/min/{1.73_m2} (ref >60)
Glucose: 214 mg/dL — ABNORMAL HIGH (ref 74–99)
Potassium: 3.4 mmol/L — ABNORMAL LOW (ref 3.5–5.0)
Sodium: 133 mmol/L (ref 132–146)
Total Bilirubin: 0.4 mg/dL (ref 0.0–1.2)
Total Protein: 6.4 g/dL (ref 6.4–8.3)

## 2022-10-13 LAB — EKG 12-LEAD
Atrial Rate: 74 {beats}/min
P Axis: 88 degrees
P-R Interval: 200 ms
Q-T Interval: 408 ms
QRS Duration: 78 ms
QTc Calculation (Bazett): 452 ms
R Axis: -17 degrees
T Axis: 16 degrees
Ventricular Rate: 74 {beats}/min

## 2022-10-13 LAB — POCT GLUCOSE
Glucose: 255 mg/dL
POC Glucose: 285 mg/dL — ABNORMAL HIGH (ref 74–99)
POC Glucose: 255 mg/dL — ABNORMAL HIGH (ref 74–99)

## 2022-10-13 LAB — PROTIME-INR
INR: 1.3
Protime: 13.8 s — ABNORMAL HIGH (ref 9.3–12.4)

## 2022-10-13 LAB — COMPREHENSIVE METABOLIC PANEL
ALT: 66 U/L — ABNORMAL HIGH (ref 0–32)
AST: 113 U/L — ABNORMAL HIGH (ref 0–31)
Albumin: 3.1 g/dL — ABNORMAL LOW (ref 3.5–5.2)
Alkaline Phosphatase: 296 U/L — ABNORMAL HIGH (ref 35–104)
Anion Gap: 9 mmol/L (ref 7–16)
BUN: 7 mg/dL (ref 6–23)
CO2: 28 mmol/L (ref 22–29)
Calcium: 8.8 mg/dL (ref 8.6–10.2)
Chloride: 93 mmol/L — ABNORMAL LOW (ref 98–107)
Creatinine: 0.5 mg/dL (ref 0.50–1.00)
Est, Glom Filt Rate: >60 mL/min/{1.73_m2} (ref >60)
Glucose: 314 mg/dL — ABNORMAL HIGH (ref 74–99)
Potassium: 4 mmol/L (ref 3.5–5.0)
Sodium: 130 mmol/L — ABNORMAL LOW (ref 132–146)
Total Bilirubin: 0.5 mg/dL (ref 0.0–1.2)
Total Protein: 7.2 g/dL (ref 6.4–8.3)

## 2022-10-13 LAB — TROPONIN
Troponin, High Sensitivity: 24 ng/L — ABNORMAL HIGH (ref 0–9)
Troponin, High Sensitivity: 19 ng/L — ABNORMAL HIGH (ref 0–9)

## 2022-10-13 LAB — COVID-19 & INFLUENZA COMBO
INFLUENZA A: NOT DETECTED
INFLUENZA B: NOT DETECTED
SARS-CoV-2 RNA, RT PCR: DETECTED — AB

## 2022-10-13 LAB — MAGNESIUM: Magnesium: 1.7 mg/dL (ref 1.6–2.6)

## 2022-10-13 LAB — PHOSPHORUS: Phosphorus: 2.5 mg/dL (ref 2.5–4.5)

## 2022-10-13 LAB — OSMOLALITY: Serum Osmolality: 282 mOsm/kg — ABNORMAL LOW (ref 285–310)

## 2022-10-13 LAB — LIPID PANEL
Cholesterol: 115 mg/dL (ref <200)
HDL: 26 mg/dL — ABNORMAL LOW (ref >40)
LDL Cholesterol: 65 mg/dL (ref <100)
Triglycerides: 121 mg/dL (ref <150)
VLDL: 24 mg/dL

## 2022-10-13 LAB — PLATELET CONFIRMATION

## 2022-10-13 LAB — PROCALCITONIN: Procalcitonin: 0.16 ng/mL — ABNORMAL HIGH (ref 0.00–0.08)

## 2022-10-13 LAB — HEMOGLOBIN A1C: Hemoglobin A1C: 9.9 % — ABNORMAL HIGH (ref 4.0–5.6)

## 2022-10-13 LAB — LACTATE, SEPSIS: Lactic Acid, Sepsis: 1 mmol/L (ref 0.5–1.9)

## 2022-10-13 LAB — LIPASE: Lipase: 5 U/L — ABNORMAL LOW (ref 13–60)

## 2022-10-13 MED ORDER — MAGNESIUM SULFATE 2000 MG/50 ML IVPB PREMIX
2 GM/50ML | INTRAVENOUS | Status: DC | PRN
Start: 2022-10-13 — End: 2022-10-13

## 2022-10-13 MED ORDER — VANCOMYCIN HCL 1 G IV SOLR
1 g | Freq: Once | INTRAVENOUS | Status: AC
Start: 2022-10-13 — End: 2022-10-13
  Administered 2022-10-13: 07:00:00 1750 mg/kg via INTRAVENOUS

## 2022-10-13 MED ORDER — LABETALOL HCL 5 MG/ML IV SOLN
5 MG/ML | INTRAVENOUS | Status: DC | PRN
Start: 2022-10-13 — End: 2022-10-13
  Administered 2022-10-13: 10:00:00 10 mg via INTRAVENOUS

## 2022-10-13 MED ORDER — ONDANSETRON HCL 4 MG/2ML IJ SOLN
4 MG/2ML | Freq: Four times a day (QID) | INTRAMUSCULAR | Status: DC | PRN
Start: 2022-10-13 — End: 2022-10-13

## 2022-10-13 MED ORDER — ACETAMINOPHEN 325 MG PO TABS
325 MG | Freq: Four times a day (QID) | ORAL | Status: DC | PRN
Start: 2022-10-13 — End: 2022-10-13

## 2022-10-13 MED ORDER — SODIUM CHLORIDE 0.9 % IV BOLUS
0.9 % | Freq: Once | INTRAVENOUS | Status: AC
Start: 2022-10-13 — End: 2022-10-12
  Administered 2022-10-13: 03:00:00 1000 mL via INTRAVENOUS

## 2022-10-13 MED ORDER — ONDANSETRON HCL 4 MG/2ML IJ SOLN
4 MG/2ML | Freq: Once | INTRAMUSCULAR | Status: AC
Start: 2022-10-13 — End: 2022-10-12
  Administered 2022-10-13: 03:00:00 4 mg via INTRAVENOUS

## 2022-10-13 MED ORDER — INSULIN LISPRO 100 UNIT/ML IJ SOLN
100 UNIT/ML | Freq: Three times a day (TID) | INTRAMUSCULAR | Status: DC
Start: 2022-10-13 — End: 2022-10-13

## 2022-10-13 MED ORDER — POTASSIUM CHLORIDE CRYS ER 20 MEQ PO TBCR
20 MEQ | Freq: Once | ORAL | Status: DC
Start: 2022-10-13 — End: 2022-10-13

## 2022-10-13 MED ORDER — SODIUM CHLORIDE 0.9 % IV SOLN
0.9 % | INTRAVENOUS | Status: AC
Start: 2022-10-13 — End: 2022-10-13
  Administered 2022-10-13: 07:00:00 via INTRAVENOUS

## 2022-10-13 MED ORDER — HYDRALAZINE HCL 20 MG/ML IJ SOLN
20 MG/ML | INTRAMUSCULAR | Status: DC | PRN
Start: 2022-10-13 — End: 2022-10-13

## 2022-10-13 MED ORDER — VIAL2BAG ADAPTOR (20 MM)
0.9 % | Freq: Once | INTRAVENOUS | Status: AC
Start: 2022-10-13 — End: 2022-10-13
  Administered 2022-10-13: 06:00:00 2000 mg via INTRAVENOUS

## 2022-10-13 MED ORDER — POLYETHYLENE GLYCOL 3350 17 G PO PACK
17 g | Freq: Every day | ORAL | Status: DC | PRN
Start: 2022-10-13 — End: 2022-10-13

## 2022-10-13 MED ORDER — INSULIN LISPRO 100 UNIT/ML IJ SOLN
100 UNIT/ML | Freq: Every evening | INTRAMUSCULAR | Status: DC
Start: 2022-10-13 — End: 2022-10-13

## 2022-10-13 MED ORDER — ENOXAPARIN SODIUM 40 MG/0.4ML IJ SOSY
40 MG/0.4ML | Freq: Every day | INTRAMUSCULAR | Status: DC
Start: 2022-10-13 — End: 2022-10-13

## 2022-10-13 MED ORDER — SODIUM CHLORIDE 0.9 % IV SOLN
0.9 % | INTRAVENOUS | Status: DC | PRN
Start: 2022-10-13 — End: 2022-10-13

## 2022-10-13 MED ORDER — ACETAMINOPHEN 650 MG RE SUPP
650 MG | Freq: Four times a day (QID) | RECTAL | Status: DC | PRN
Start: 2022-10-13 — End: 2022-10-13

## 2022-10-13 MED ORDER — NORMAL SALINE FLUSH 0.9 % IV SOLN
0.9 % | INTRAVENOUS | Status: DC | PRN
Start: 2022-10-13 — End: 2022-10-13

## 2022-10-13 MED ORDER — ONDANSETRON 4 MG PO TBDP
4 MG | Freq: Three times a day (TID) | ORAL | Status: DC | PRN
Start: 2022-10-13 — End: 2022-10-13

## 2022-10-13 MED ORDER — NORMAL SALINE FLUSH 0.9 % IV SOLN
0.9 % | Freq: Two times a day (BID) | INTRAVENOUS | Status: DC
Start: 2022-10-13 — End: 2022-10-13

## 2022-10-13 MED FILL — VANCOMYCIN HCL 10 G IV SOLR: 10 g | INTRAVENOUS | Qty: 1750

## 2022-10-13 MED FILL — LABETALOL HCL 5 MG/ML IV SOLN: 5 MG/ML | INTRAVENOUS | Qty: 20

## 2022-10-13 MED FILL — LOVENOX 40 MG/0.4ML IJ SOSY: 40 MG/0.4ML | INTRAMUSCULAR | Qty: 0.4

## 2022-10-13 MED FILL — CEFEPIME HCL 2 G IV SOLR: 2 g | INTRAVENOUS | Qty: 2

## 2022-10-13 MED FILL — POTASSIUM CHLORIDE CRYS ER 20 MEQ PO TBCR: 20 MEQ | ORAL | Qty: 2

## 2022-10-13 MED FILL — INSULIN LISPRO 100 UNIT/ML IJ SOLN: 100 UNIT/ML | INTRAMUSCULAR | Qty: 2

## 2022-10-13 MED FILL — ONDANSETRON HCL 4 MG/2ML IJ SOLN: 4 MG/2ML | INTRAMUSCULAR | Qty: 2

## 2022-10-13 MED FILL — HYDRALAZINE HCL 20 MG/ML IJ SOLN: 20 MG/ML | INTRAMUSCULAR | Qty: 1

## 2022-10-13 MED FILL — PAIN & FEVER 325 MG PO TABS: 325 MG | ORAL | Qty: 2

## 2022-10-13 NOTE — ED Notes (Signed)
Patient agreeable to stay.    Patient urinated 500 mL and loose BM in ED toilet.    Nurse called blood bank to check status on blood. Additional blood work required for blood bank. Lab pulled and sent. Blood is currently not ready at this time

## 2022-10-13 NOTE — ED Notes (Signed)
Patient advised of need for urine sample. Pt verbalized understanding and will page RN when able to provide sample.

## 2022-10-13 NOTE — Discharge Summary (Cosign Needed)
[facility]  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  History of uterine cancer  History of hypertension  History of hyperlipidemia  History of type 2 diabetes    Discharge Diagnosis:  Neutropenia 2/2 chemotherapy  Positive COVID-19  Hyponatremia  Elevated alk phos  Elevated transaminases  Normocytic anemia  Thrombocytopenia  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 She has been having weakness, fatigue, and weakness, diarrhea, SOB nausea and abdominal pain since she finished chemo on Friday 10/08/22. In the ED labs was significancent for WBC 0.9, hemoglobin 8.6, platelets 99, Pro-Cal 0.16, Na 130. Hgb 8.6>6.9. 1U RBC was ordered. Pt stated she was not able to pee, and last time she pee was yesterday. Bladder scan was ordered due to concern of decreased urine output or urinary retention. At about noon, got PS about pt wants to leave AMA. Came to see and examined pt at bedside within 5 mins, she was dressed and sit at bedside on arrived. I asked her what's wrong, pt stated that she felt she didn't get enough attention, the BP cuft made her uncomfortable and she called help several times however no one come and help her. She stated she is not hungry or cold, I explained to her that her Hgb is low, she needs RBC transfusion, and she needs to get bladder scan due to concern of urinary retention. If she  leaves AMA it's very likely she will end up in ED again. She understood and agree to stay. I told to bedside RN to put her back to bed and get extra blanket for her.  Later in PM, got PS from RN, pt becoming verbally abusive and is going  AMA, she was A&O x4, RN gave her AMA paper to sign.       Physical Exam  Constitutional:       General: She is not in acute distress.     Appearance: Normal appearance. She is not toxic-appearing.   HENT:      Head: Normocephalic and atraumatic.   Cardiovascular:      Rate and Rhythm: Normal rate and regular rhythm.      Heart sounds: Normal heart sounds, S1 normal and S2 normal. No murmur heard.     No friction rub. No gallop.   Pulmonary:      Effort: Pulmonary effort is normal. No respiratory distress.      Breath sounds: Normal breath sounds. No stridor. No wheezing, rhonchi or rales.   Abdominal:      Palpations: Abdomen is soft.      Tenderness: There is no abdominal tenderness.   Musculoskeletal:      Right lower leg: No edema.      Left lower leg: No edema.   Skin:     General: Skin is warm and dry.   Neurological:      Mental Status: She is  alert.   Psychiatric:         Attention and Perception: Attention normal.         Speech: Speech normal.       Pending test results:   Urine analysis    Consults:  None    Procedures:  None    Condition at discharge: Stable    Disposition: home    Outpatient Recommendations:  Present to ED if symptoms worsen    Discharge Medications:     Medication List        ASK your doctor about these medications      b complex vitamins capsule     carvedilol 25 MG tablet  Commonly known as: COREG     Creon 36000-114000 units Cpep delayed release capsule  Generic drug: lipase-protease-amylase     furosemide 20 MG tablet  Commonly known as: LASIX     glipiZIDE 10 MG tablet  Commonly known as: GLUCOTROL     lisinopril 20 MG tablet  Commonly known as: PRINIVIL;ZESTRIL     magnesium 30 MG tablet     ondansetron 4 MG tablet  Commonly known as:  ZOFRAN  Take 2 tablets by mouth every 8 hours as needed for Nausea or Vomiting     Os-Cal Ultra 600 MG Tabs     potassium chloride 20 MEQ extended release tablet  Commonly known as: KLOR-CON M  Take 1 tablet by mouth 2 times daily     prochlorperazine 10 MG tablet  Commonly known as: COMPAZINE  Take 1 tablet by mouth every 6 hours as needed (n/v)     traMADol 50 MG tablet  Commonly known as: ULTRAM     vitamin D 50 MCG (2000 UT) Caps capsule               Judeen Hammans, MD  PGY- 1   10:44 AM 10/15/2022      Attending physician: Dr. Rudi Heap

## 2022-10-13 NOTE — ED Notes (Signed)
Patient given lunch tray

## 2022-10-13 NOTE — ED Notes (Signed)
Pt signed AMA. Patient is in waiting area waiting for ride home.   Nurse called patient's son and explained that patient is leaving AMA. Pt is A&Ox4 and we cannot keep her if she chooses to leave.    Nurse explained patient's hgb levels and results with son and explained the dangers of low hgb. Patient's son verbalized understanding.     Patient's son states that he will be here shortly to pick up his mother.    Nurse advised son to seek treatment in an ED if symptoms worsen or continue. Son verbalized understanding

## 2022-10-13 NOTE — ED Notes (Addendum)
Patient put on call light and yelling at nurse, "I'M LEAVING! I'M GOING HOME NOW!."  IM resident perfect served.     Nurse attempting to educate patient, pt yelling at nurse to stop and that she is leaving

## 2022-10-13 NOTE — Progress Notes (Signed)
Physician Progress Note      PATIENT:               Gabriella Blake, Gabriella Blake  CSN #:                  [id number]  DOB:                       [birth year]  ADMIT DATE:       10/13/2022 12:09 AM  DISCH DATE:        10/13/2022 2:18 PM  RESPONDING  PROVIDER #:        Gennette Pac MD          QUERY TEXT:    Patient admitted with [place] -19, noted to have WBC 1.2, Absolute neutrophil   count 1.00, RBC 2.47, platelet count: 71, and H&H: 6.9/21.6.  If possible,   please document in progress notes and discharge summary if you are evaluating   and/or treating any of the following:    The medical record reflects the following:  Risk Factors: Chemotherapy for pancreatic CA; COVID  Clinical Indicators:  Per H&P: Neutropenia 2/2 chemotherapy  10/13/22:  WBC 1.2; Absolute neutrophil count 1.00;  RBC 2.47;  platelet count:   71;  H&H: 6.9/21.6  Treatment: Laboratory monitoring; Planned hematology/oncology consult;   Neutropenic Isolation; Planned blood transfusion    Thank you,  Melissa Mellott BSN, R.N.  Clinical Documentation Improvement  [phone]  Options provided:  -- Antineoplastic (chemotherapy) induced pancytopenia  -- Other - I will add my own diagnosis  -- Disagree - Not applicable / Not valid  -- Disagree - Clinically unable to determine / Unknown  -- Refer to Clinical Documentation Reviewer    PROVIDER RESPONSE TEXT:    Patient has antineoplastic (chemotherapy) induced pancytopenia.    Query created by: Holley Raring on 11/02/2022 9:37 AM      Electronically signed by:  Gennette Pac MD 11/02/2022 2:05 PM

## 2022-10-13 NOTE — ED Notes (Signed)
Patient becoming verbally abusive with this nurse.  Patient is A&Ox4. IM resident aware that nurse will be giving patient her [place] papers.

## 2022-10-13 NOTE — Plan of Care (Signed)
Explained the urgency of blood transfusion with patient at bedside, discussed the complications and risk, pt understood and gave verbal consent.

## 2022-10-13 NOTE — ED Notes (Signed)
Blood bank just called. Patient's blood is ready.    Patient leaving AMA at this time

## 2022-10-13 NOTE — ED Notes (Signed)
Blood consent form completed and in hard chart

## 2022-10-13 NOTE — H&P (Signed)
[facility]  Internal Medicine Residency Program  History and Physical    Patient:  Gabriella Blake 82 y.o. female MRN: [id number]     Date of Service: 10/13/2022    Hospital Day: 1    History of Present Illness   Gabriella Blake is a 82 y.o. female with PMHx of pancreatic adenocarcinoma, hypertension, hyperlipidemia and type 2 diabetes came in with a CC of positive home COVID test.  Patient stated she completed her last chemo session on Friday (12/29) and started to develop feelings of weakness, fatigue, and diarrhea, nausea and abdominal pain.  Patient initially thought this was just due to side effects of her chemotherapy.  Symptoms did persist as well as having some shortness of breath patient ultimately decided to take a home COVID test which was positive.  The symptoms have persisted and she opted to come into the ED for further evaluation.  She denies any fever at home but did state she has had some chills and loss of appetite.  Patient has been undergoing chemotherapy for the last few months and follows with Dr. Cline Crock.  She states that she has lost around 80 pounds in the last 6 months with her diagnosis of pancreatic cancer.  She has had a decreased appetite more than her usual over the last few days with her positive COVID test.  Patient is having some shortness of breath more with exertion but is resting comfortably during examination with mask on on room air.  While in the ED labs of significance showed a white count of 0.9, hemoglobin 8.6, platelets 99, Pro-Cal 0.16, sodium 130.  Lactic acid level was drawn and blood cultures were obtained.  Antibiotics were given for concerns of neutropenia.  Patient saturating 90% on room air is afebrile upon arrival.  Patient admitted to the house team for further management and observation.      In the ED patient received 1 dose of vancomycin and a dose of Zosyn.          Past Medical History:       Diagnosis Date    Diabetes mellitus ([place])      Endometrial cancer ([place])     Hyperlipidemia     Hypertension     Pancreatic cancer [facility])        Past Surgical History:        Procedure Laterality Date    CHOLECYSTECTOMY      COLONOSCOPY      ERCP N/A 09/28/2022    ERCP STENT REMOVAL/EXCHANGE performed by Georgeann Oppenheim, MD at [facility] ENDOSCOPY    ERCP N/A 09/28/2022    ERCP STONE REMOVAL performed by Georgeann Oppenheim, MD at [place], TOTAL ABDOMINAL (CERVIX REMOVED)      SPINE SURGERY      Plate placed in back    UPPER GASTROINTESTINAL ENDOSCOPY         Medications Prior to Admission:    Prior to Admission medications    Medication Sig Start Date End Date Taking? Authorizing Provider   prochlorperazine (COMPAZINE) 10 MG tablet Take 1 tablet by mouth every 6 hours as needed (n/v) 10/01/22 10/01/23  El Amil, Vernon Prey, MD   ondansetron (ZOFRAN) 4 MG tablet Take 2 tablets by mouth every 8 hours as needed for Nausea or Vomiting 10/01/22   El Amil, Vernon Prey, MD   potassium chloride (KLOR-CON M) 20 MEQ extended release tablet Take 1 tablet by mouth 2 times daily  09/20/22   El Amil, Vernon Prey, MD   carvedilol (COREG) 25 MG tablet Take 1 tablet by mouth 2 times daily 08/26/22   [provider]   UNIFINE PENTIPS 31G X 5 MM MISC  07/15/22   [provider]   CREON 36000-114000 units CPEP delayed release capsule Take by mouth 3 times daily (with meals) 09/07/22   [provider]   potassium chloride (KLOR-CON M) 20 MEQ extended release tablet Take by mouth daily 08/09/22   [provider]   glipiZIDE (GLUCOTROL) 5 MG tablet Take 2 tablets by mouth 2 times daily (before meals) 06/16/22   [provider]   traMADol Veatrice Bourbon) 50 MG tablet  06/22/22   [provider]   lisinopril (PRINIVIL;ZESTRIL) 20 MG tablet Take 1 tablet by mouth daily    [provider]   magnesium 30 MG tablet Take 1 tablet by mouth daily    [provider]   b complex vitamins capsule Take 1 capsule by mouth daily    [provider]   Calcium Carb-Vit D-C-E-Mineral (OS-CAL ULTRA) 600 MG TABS Take by mouth    [provider]   Cholecalciferol (VITAMIN D) 50 MCG (2000 UT) CAPS capsule Take by mouth    [provider]   doxazosin (CARDURA) 4 MG tablet Take 1 tablet by mouth nightly    [provider]       Allergies:   Molds & smuts, Nicotine, and Shellfish allergy    Social History:   TOBACCO:   reports that she has never smoked. She has never used smokeless tobacco.  ETOH:   reports current alcohol use.       Family History:       Problem Relation Age of Onset    Breast Cancer Sister 28    Cancer Sister 65        throat    Cancer Maternal Grandmother 53        unknown type       Review of Systems:  Constitutional: Positive for for appetite change, chills, fatigue, and unexpected weight change. Negative for  fever.   Neuro:  Negative for dizziness, light-headedness and headaches.  Positive for weakness  Eyes:  Negative for discharge and redness.   HEENT:  Negative for sneezing and sore throat.    Cardiovascular:  Negative for chest pain, palpitations and leg swelling.   Respiratory:  Negative for cough, chest tightness,  and wheezing.  Positive forshortness of breath  GI:  Negative for abdominal distention; positive for abdominal pain, N/D  GU:  Negative for dysuria or increased frequency.   Skin: Negative.    Other:       Physical Exam   Vitals: BP 139/69   Pulse 76   Temp 98.9 F (37.2 C) (Oral)   Resp 20   Ht 1.676 m ('5\' 6"'$ )   Wt 85.7 kg (189 lb)   SpO2 98%   BMI 30.51 kg/m     Physical Exam:  General Appearance: No acute distress. Awake and conversant.   Neuro: Round symmetric pupil that react to light bilaterally    Cardiovascular: regular rate and rhythm, S1 and S2 heard, no murmurs appreciated   Respiratory: Clear to auscultation bilaterally. No wheezing or crackles appreciated.  Abdomen: Soft, non-tender; bowel sounds normal; no masses, no organomegaly, rebound or guarding  Extremities:  Extremities normal, no cyanosis, distal pulses intact, trace pedal edema (more prominent along the ankles).   Other:  Labs and Imaging Studies     Basic Labs:    Complete Blood Count:   Recent Labs     10/12/22  2137   WBC 0.9*   HGB 8.6*   HCT 27.3*   PLT 99*        Last 3 Blood Glucose:   Recent Labs     10/12/22  2137   GLUCOSE 314*        PT/INR:  No results found for: "PROTIME", "INR"  PTT:  No results found for: "APTT", "PTT"    Comprehensive Metabolic Profile:   Recent Labs     10/12/22  2137   NA 130*   K 4.0   CL 93*   CO2 28   BUN 7   CREATININE 0.5   GLUCOSE 314*   CALCIUM 8.8   PROT 7.2   LABALBU 3.1*   BILITOT 0.5   ALKPHOS 296*   AST 113*   ALT 66*      Magnesium: No results found for: "MG"  Phosphorus: No results found for: "PHOS"  Ionized Calcium: No results found for: "CAION"   Troponin: No results for input(s): "TROPONINI" in the last 72 hours.    Imaging Studies:     XR CHEST PORTABLE    Result Date: 10/12/2022  EXAMINATION: ONE XRAY VIEW OF THE CHEST 10/12/2022 8:23 pm COMPARISON: None. HISTORY: ORDERING SYSTEM PROVIDED HISTORY: sob TECHNOLOGIST PROVIDED HISTORY: Reason for exam:->sob What reading provider will be dictating this exam?->CRC FINDINGS: The lungs are without acute focal process.  There is no effusion or pneumothorax. The cardiomediastinal silhouette is without acute process. The osseous structures are without acute process.     No acute process.     FLUORO FOR SURGICAL PROCEDURES    Result Date: 10/02/2022  EXAMINATION: SPOT FLUOROSCOPIC IMAGES 09/28/2022 2:08 pm TECHNIQUE: Fluoroscopy was provided by the radiology department for procedure. Radiologist was not present during examination. FLUOROSCOPY DOSE AND TYPE: Radiation Exposure Index: Fluoroscopy time equals 17.4 seconds.  Total dose equals 8.02 mGy, COMPARISON: None HISTORY: ORDERING SYSTEM PROVIDED HISTORY: E.R.C.P. TECHNOLOGIST PROVIDED HISTORY: Reason for exam:->E.R.C.P. What reading provider will be dictating this  exam?->CRC Intraprocedural imaging. FINDINGS: 7 intraoperative spot images of the right upper quadrant obtained.     Intraprocedural fluoroscopic spot images as above.  See separate procedure report for more information.     ERCP    Result Date: 09/28/2022  ST. [facility] Peppers Patient: LUA FENG MRN: [id number] DOB: [birth year] Account: [id number] Sex at Birth: Female Age: 68 Years Procedure: ERCP Date: 09/28/2022 Attending Physician: Lamar Sprinkles , MD Indications:        -  Biliary stent exchange Medications:        -  See the Anesthesia note for documentation of the administered medications Complications:        -  No immediate complications. Estimated Blood Loss:        -  Estimated blood loss was minimal. Procedure:        -  The risks and benefits of the procedure and the sedation options and risks           were discussed with the patient. All questions were answered and informed           consent was obtained.        - The possibilities of reaction to medication, pulmonary aspiration,           perforation of the gastrointestinal tract, bleeding requiring transfusion or  operation, respiratory failure requiring placement on a ventilator, failure to           diagnose a condition or identify polyps/lesions, and acute pancreatitis with           potential need for prolonged hospitalization were discussed with the patient.        - The DUODENOSCOPE/ERCP was introduced through the mouth and advanced to the           duodenum and used to inject contrast into the bile duct.        -  The patient tolerated the procedure well. Findings:        -  A scout film of the abdomen was obtained.  Surgical clips, consistent with           previous cholecystectomy, were seen in the area of the.  One stent ending in           the main bile duct was seen.        -  The esophagus was successfully intubated under direct vision without           detailed examination of the pharynx, larynx, and associated structures,  and           upper GI tract.  The upper GI tract was grossly normal.        - One covered metal stent originating in the common bile duct was emerging           from the major papilla.  A biliary sphincterotomy had been performed.  The           sphincterotomy appeared open. The mucosa of the papilla appeared inflamed and           nodular in the setting of known malignancy and likely reactive from indwelling           stent.        -  One stent was removed from the common bile duct using a rat-toothed           forceps.        - A 0.025 inch straight guidewire was passed into the biliary tree.  The           short-nosed traction sphincterotome was passed over the guidewire and the bile           duct was then deeply cannulated.  Contrast was injected.  I personally           interpreted the bile duct images.  Ductal flow of contrast was adequate.  Image           quality was adequate.  Contrast extended to the bifurcation.        -  The biliary tree was swept with a 12 mm balloon starting at the           bifurcation.  Debris was swept from the duct.  Sludge was swept from the duct.        -  Preparations were made for cholangiography using balloon occlusion           technique.  A balloon-tipped catheter was advanced into the common bile duct.           The balloon was inflated to 12 mm in size.  Contrast was then injected into the           biliary tree and opacified the entire biliary tree.  I personally interpreted  the bile duct images.  Image quality was adequate.   The lower third of the           main bile duct contained a single severe stenosis.        -  One 10 mm by 6 cm covered metal stent was placed into the common bile duct.            Bile flowed through the stent.  The stent was in good position. Impression:        -  One stent from the common bile duct was seen in the major papilla.        -  Prior biliary sphincterotomy appeared open.        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  The stricture was malignant appearing.        -  One stent was removed from the common bile duct.        -  The biliary tree was swept and debris and sludge were found.        -  One covered metal stent was placed into the common bile duct. Procedure Code(s):        - [id number], Endoscopic retrograde cholangiopancreatography (ERCP); with removal           and exchange of stent(s), biliary or pancreatic duct, including pre- and           post-dilation and guide wire passage, when performed, including sphincterotomy,           when performed, each stent exchanged        - 54627, Endoscopic retrograde cholangiopancreatography (ERCP); with removal           of calculi/debris from biliary/pancreatic duct(s)        - 03500, Endoscopic catheterization of the biliary ductal system, radiological           supervision and interpretation Diagnosis Code(s):        - Z96.89, Presence of other specified functional implants        - Z46.59, Encounter for fitting and adjustment of other gastrointestinal           appliance and device        - K83.1, Obstruction of bile duct       CPT(R) - 2022 copyright [employer]. All Rights Reserved.       The CPT codes, CCI edits and ICD codes generated are intended as suggestions       and were generated based on input data.  These codes are preliminary and upon       coder review may be revised to meet current compliance and payer requirements.       The provider is responsible for the final determination of appropriate codes,       and modifiers. Dr. Lamar Sprinkles This document has been electronically signed. Note Initiated:09/28/2022 Note Completed:09/28/2022 12:57 PM Dr. Lamar Sprinkles This document has been electronically signed. Note Amended:10/09/2022 9:22 AM    CT CHEST W CONTRAST    Result Date: 09/23/2022  EXAMINATION: CT OF THE CHEST WITH CONTRAST; CT OF THE ABDOMEN AND PELVIS WITH CONTRAST 09/23/2022 12:54 pm TECHNIQUE: CT  of the chest was performed with the administration of intravenous contrast. Multiplanar reformatted images are provided for review. Automated exposure control, iterative reconstruction, and/or weight based adjustment of  the mA/kV was utilized to reduce the radiation dose to as low as reasonably achievable.; CT of the abdomen and pelvis was performed with the administration of intravenous contrast. Multiplanar reformatted images are provided for review. Automated exposure control, iterative reconstruction, and/or weight based adjustment of the mA/kV was utilized to reduce the radiation dose to as low as reasonably achievable. COMPARISON: None. HISTORY: ORDERING SYSTEM PROVIDED HISTORY: Malignant neoplasm of head of pancreas [facility]) TECHNOLOGIST PROVIDED HISTORY: STAT Creatinine as needed:->No Reason for exam:->cancer work up FINDINGS: Chest: Mediastinum: Thyroid is homogeneous in attenuation. No bulky mediastinal adenopathy. Central airways are patent. Esophagus is normal course and caliber. Patent nonaneurysmal thoracic aorta.  Cardiac size enlarged four-chamber cardiomegaly with coronary and valvular calcifications however no pericardial effusion. Lungs/pleura: Lungs are clear without focal opacification or consolidation. Right upper lobe lateral subpleural nodule and right mid lung 2-3 mm noncalcified nodules.  No pleural effusion or pleural process. Soft Tissues/Bones: No acute osseous or soft tissue findings. No aggressive osseous lesion.  Right chest wall port in place. Abdomen/Pelvis: Organs: Liver demonstrates pneumobilia without focal or suspicious liver lesion.  Gallbladder surgically absent.  Ill-defined margins in the pancreatic head region concerning for underlying mass around 2 cm of hypoenhancement with duodenal stent in place.  Dilated main pancreatic duct throughout the distal segments greatest in the body portion up to 1.2 cm diameter extending throughout the atrophic tail portion.  Scattered  peripancreatic lymph nodes are seen throughout measuring up to around 8 mm. Adrenals without nodule.  Kidneys without suspicious renal lesion and no hydronephrosis. GI/Bowel: No focal thickening or disproportion dilatation of bowel.  No inflammatory findings.  Moderate distal colonic and rectal stool burden without perforation or abscess. Pelvis: No suspicious pelvic lesion or bulky pelvic adenopathy/free fluid. Peritoneum/Retroperitoneum: Patent nonaneurysmal abdominal aorta.  No bulky retroperitoneal adenopathy. Bones/Soft Tissues: No acute osseous or soft tissue findings.  Postsurgical changes status post ventral abdominal wall hernia repair with residual or recurrent fat containing hernia along the midportion series 1301, image 106.     Chest: Small 2-3 mm noncalcified nodules in the right upper and right mid lung nonspecific with attention on follow-up. Abdomen and pelvis: Ill-defined fullness of the pancreatic head around 2 cm hypoenhancing focus concerning for underlying malignancy given pancreatic ductal dilatation and adjacent minimal inflammatory stranding with pneumobilia.  Duodenal stent in place without significant intrahepatic biliary dilatation.  Surrounding adenopathy without metastatic disease otherwise noted.     CT ABDOMEN PELVIS W IV CONTRAST    Result Date: 09/23/2022  EXAMINATION: CT OF THE CHEST WITH CONTRAST; CT OF THE ABDOMEN AND PELVIS WITH CONTRAST 09/23/2022 12:54 pm TECHNIQUE: CT of the chest was performed with the administration of intravenous contrast. Multiplanar reformatted images are provided for review. Automated exposure control, iterative reconstruction, and/or weight based adjustment of the mA/kV was utilized to reduce the radiation dose to as low as reasonably achievable.; CT of the abdomen and pelvis was performed with the administration of intravenous contrast. Multiplanar reformatted images are provided for review. Automated exposure control, iterative reconstruction, and/or  weight based adjustment of the mA/kV was utilized to reduce the radiation dose to as low as reasonably achievable. COMPARISON: None. HISTORY: ORDERING SYSTEM PROVIDED HISTORY: Malignant neoplasm of head of pancreas [facility]) TECHNOLOGIST PROVIDED HISTORY: STAT Creatinine as needed:->No Reason for exam:->cancer work up FINDINGS: Chest: Mediastinum: Thyroid is homogeneous in attenuation. No bulky mediastinal adenopathy. Central airways are patent. Esophagus is normal course and caliber. Patent nonaneurysmal thoracic aorta.  Cardiac size enlarged four-chamber cardiomegaly  with coronary and valvular calcifications however no pericardial effusion. Lungs/pleura: Lungs are clear without focal opacification or consolidation. Right upper lobe lateral subpleural nodule and right mid lung 2-3 mm noncalcified nodules.  No pleural effusion or pleural process. Soft Tissues/Bones: No acute osseous or soft tissue findings. No aggressive osseous lesion.  Right chest wall port in place. Abdomen/Pelvis: Organs: Liver demonstrates pneumobilia without focal or suspicious liver lesion.  Gallbladder surgically absent.  Ill-defined margins in the pancreatic head region concerning for underlying mass around 2 cm of hypoenhancement with duodenal stent in place.  Dilated main pancreatic duct throughout the distal segments greatest in the body portion up to 1.2 cm diameter extending throughout the atrophic tail portion.  Scattered peripancreatic lymph nodes are seen throughout measuring up to around 8 mm. Adrenals without nodule.  Kidneys without suspicious renal lesion and no hydronephrosis. GI/Bowel: No focal thickening or disproportion dilatation of bowel.  No inflammatory findings.  Moderate distal colonic and rectal stool burden without perforation or abscess. Pelvis: No suspicious pelvic lesion or bulky pelvic adenopathy/free fluid. Peritoneum/Retroperitoneum: Patent nonaneurysmal abdominal aorta.  No bulky retroperitoneal adenopathy.  Bones/Soft Tissues: No acute osseous or soft tissue findings.  Postsurgical changes status post ventral abdominal wall hernia repair with residual or recurrent fat containing hernia along the midportion series 1301, image 106.     Chest: Small 2-3 mm noncalcified nodules in the right upper and right mid lung nonspecific with attention on follow-up. Abdomen and pelvis: Ill-defined fullness of the pancreatic head around 2 cm hypoenhancing focus concerning for underlying malignancy given pancreatic ductal dilatation and adjacent minimal inflammatory stranding with pneumobilia.  Duodenal stent in place without significant intrahepatic biliary dilatation.  Surrounding adenopathy without metastatic disease otherwise noted.       Resident's Assessment and Plan     SUMMARY OF HOSPITAL STAY:   Patient presented from home due to positive home COVID test along with weakness, diarrhea, shortness of breath, nausea and abdominal pain for the past few days after receiving chemo.    Consults:   Hem/Onc - El Amil      Assessment:  Neutropenia 2/2 chemotherapy  Positive COVID-19  Hyponatremia  Elevated alk phos  Elevated transaminases  Normocytic anemia  Thrombocytopenia  Pancreatic adenocarcinoma  Diagnosed June 2023, last chemo session 12/29  History of uterine cancer  History of hypertension  History of hyperlipidemia  History of type 2 diabetes    Plan:  Follow-up blood cultures  Monitor off antibiotics and resume if cultures positive  Follow-up urine studies  Follow-up UA and order culture if necessary  Follow-up viral panel  Continue IV fluids overnight and reassess in the morning  Neutropenic isolation precautions  Started on low-dose sliding scale and follow POCT glucose  Monitor blood pressure, PRNs ordered  Follow-up morning lipid panel and A1c  Consulted oncology -appreciate recommendations  Monitor respiratory status  Monitor electrolytes and replace as necessary      PT/OT evaluation: Will order when indicated  DVT  prophylaxis: Lovenox  GI prophylaxis: Diet  Bowel regimen: scheduled   Pain management: as needed   Diet: ADULT DIET; Regular; 4 carb choices (60 gm/meal)  Code Status: Full Code   Disposition: admitted to inpatient     Carmin Muskrat, MD, PGY-2  Attending physician: Dr. Roma Kayser   Precepted with Dr. Kathrene Alu. [facility]  Internal Medicine Residency / House Medicine Service--addendum to resident note  Attending Physician Statement:  Altamese Dilling, M.D., F.A.C.P.  Hospital charts reviewed, including other providers notes, relevant labs and imaging. Coordinating care with residents, other providers and/or counseling patient    I have seen patient/discussed the history and PE with the resident, and I agree with workup/plan and orders as documented by the resident.  (billing based on complexity of Medical decision making)  My Assessment as follows:     Sp ER vanco/cefepime x1  Noted covid infection-- NOT pneumonia, not hypoxic    Sp chemo pancytopenia  -- pancreatic CA (no obvious mets +llymphadenopathy)- gemcytobene  +taxol  Last one week ago    Neutropenia  1000 today  Covid - not neutropenic fever  ?WBC stimulating factor    Getting blood  immunocompromised  Prior immune response to covid- ig G status  No fevers  +hot/chills    Check UA  -patient not giving [place] samples    Gi effects  Weakness generalized  Rule out presyncope  Bp up in lying-- chronic HTN  Hold lasix, concerns of dec PO intake/ACE hold- give IV fluids  Doubt aki    Liver enzymes trending-- covid, CA, sp biliary stent  Dec Alk phos- sp chemo    Dm2  Uncontrolled bS here    Risk of clotting with covid and pancreatic CA  Hgiher than risk of bleeding  Unless platelets drop below 30?  Doubt HIT, dobut GIB

## 2022-10-13 NOTE — ED Notes (Signed)
Patient is A&Ox4 and verbalizes understanding that her hemoglobin is low and she is in need of a transfusion. Pt also understands that she has covid and to return to ED if symptoms worsen.

## 2022-10-13 NOTE — ED Notes (Signed)
Patient wants to leave AMA. IM resident at bedsude talk ing to pt.

## 2022-10-13 NOTE — Plan of Care (Signed)
Got PS around 13:43, per RN, pt becoming verbally abusive, A&O X4, and is going AMA, RN gave her AMA paper to sign.

## 2022-10-13 NOTE — Plan of Care (Signed)
Got PS about pt wants to leave AMA. Came to see and examined pt at bedside within 5 mins, she was dressed and sit at bedside on arrived. I asked her what's wrong, pt stated that she felt she didn't get enough attention, the BP cuft made her uncomfortable and she called help several times however no one come and help her. She stated she is not hungry or cold, I explained to her that her Hgb is low, she needs RBC transfusion, and she needs to get bladder scan due to concern of urinary retention. If she leaves AMA it's very likely she will end up in ED again. She understood and agree to stay. I told to bedside RN to put her back to bed and get extra blanket for her.

## 2022-10-14 ENCOUNTER — Inpatient Hospital Stay
Admission: EM | Admit: 2022-10-14 | Discharge: 2022-10-20 | Disposition: A | Discharge status: Skilled Nursing Facility | Payer: MEDICARE | Admitting: Internal Medicine

## 2022-10-14 DIAGNOSIS — R0609 Other forms of dyspnea: Secondary | ICD-10-CM

## 2022-10-14 DIAGNOSIS — U071 COVID-19: Principal | ICD-10-CM

## 2022-10-14 DIAGNOSIS — D84821 Immunodeficiency due to drugs: Secondary | ICD-10-CM | POA: Diagnosis not present

## 2022-10-14 DIAGNOSIS — R7401 Elevation of levels of liver transaminase levels: Secondary | ICD-10-CM | POA: Diagnosis not present

## 2022-10-14 DIAGNOSIS — E785 Hyperlipidemia, unspecified: Secondary | ICD-10-CM | POA: Diagnosis not present

## 2022-10-14 DIAGNOSIS — E119 Type 2 diabetes mellitus without complications: Secondary | ICD-10-CM | POA: Diagnosis not present

## 2022-10-14 DIAGNOSIS — I10 Essential (primary) hypertension: Secondary | ICD-10-CM | POA: Diagnosis not present

## 2022-10-14 DIAGNOSIS — R41841 Cognitive communication deficit: Secondary | ICD-10-CM | POA: Diagnosis not present

## 2022-10-14 DIAGNOSIS — R262 Difficulty in walking, not elsewhere classified: Secondary | ICD-10-CM | POA: Diagnosis not present

## 2022-10-14 DIAGNOSIS — R0602 Shortness of breath: Secondary | ICD-10-CM | POA: Diagnosis not present

## 2022-10-14 DIAGNOSIS — C259 Malignant neoplasm of pancreas, unspecified: Secondary | ICD-10-CM | POA: Diagnosis not present

## 2022-10-14 DIAGNOSIS — E1165 Type 2 diabetes mellitus with hyperglycemia: Secondary | ICD-10-CM | POA: Diagnosis not present

## 2022-10-14 DIAGNOSIS — D709 Neutropenia, unspecified: Secondary | ICD-10-CM | POA: Diagnosis not present

## 2022-10-14 DIAGNOSIS — D701 Agranulocytosis secondary to cancer chemotherapy: Secondary | ICD-10-CM | POA: Diagnosis not present

## 2022-10-14 DIAGNOSIS — E46 Unspecified protein-calorie malnutrition: Secondary | ICD-10-CM | POA: Diagnosis not present

## 2022-10-14 DIAGNOSIS — E871 Hypo-osmolality and hyponatremia: Secondary | ICD-10-CM | POA: Diagnosis not present

## 2022-10-14 DIAGNOSIS — I499 Cardiac arrhythmia, unspecified: Secondary | ICD-10-CM | POA: Diagnosis not present

## 2022-10-14 DIAGNOSIS — D61810 Antineoplastic chemotherapy induced pancytopenia: Secondary | ICD-10-CM | POA: Diagnosis not present

## 2022-10-14 DIAGNOSIS — K831 Obstruction of bile duct: Secondary | ICD-10-CM | POA: Diagnosis not present

## 2022-10-14 LAB — CBC WITH AUTO DIFFERENTIAL
Absolute Immature Granulocyte: <0.03 10*3/uL (ref 0.00–0.58)
Basophils %: 1 % (ref 0.0–2.0)
Basophils Absolute: 0.01 10*3/uL (ref 0.00–0.20)
Eosinophils %: 2 % (ref 0–6)
Eosinophils Absolute: 0.04 10*3/uL — ABNORMAL LOW (ref 0.05–0.50)
Hematocrit: 26.3 % — ABNORMAL LOW (ref 34.0–48.0)
Hemoglobin: 8.2 g/dL — ABNORMAL LOW (ref 11.5–15.5)
Immature Granulocytes: 1 % (ref 0.0–5.0)
Lymphocytes %: 29 % (ref 20.0–42.0)
Lymphocytes Absolute: 0.5 10*3/uL — ABNORMAL LOW (ref 1.50–4.00)
MCH: 27.1 pg (ref 26.0–35.0)
MCHC: 31.2 g/dL — ABNORMAL LOW (ref 32.0–34.5)
MCV: 86.8 fL (ref 80.0–99.9)
MPV: 10.4 fL (ref 7.0–12.0)
Monocytes %: 6 % (ref 2.0–12.0)
Monocytes Absolute: 0.11 10*3/uL (ref 0.10–0.95)
Neutrophils %: 61 % (ref 43.0–80.0)
Neutrophils Absolute: 1.07 10*3/uL — ABNORMAL LOW (ref 1.80–7.30)
Platelet, Fluorescence: 77 10*3/uL — ABNORMAL LOW (ref 130–450)
RBC: 3.03 m/uL — ABNORMAL LOW (ref 3.50–5.50)
RDW: 15 % (ref 11.5–15.0)
WBC: 1.8 10*3/uL — ABNORMAL LOW (ref 4.5–11.5)

## 2022-10-14 LAB — COMPREHENSIVE METABOLIC PANEL
ALT: 86 U/L — ABNORMAL HIGH (ref 0–32)
AST: 204 U/L — ABNORMAL HIGH (ref 0–31)
Albumin: 2.7 g/dL — ABNORMAL LOW (ref 3.5–5.2)
Alkaline Phosphatase: 468 U/L — ABNORMAL HIGH (ref 35–104)
Anion Gap: 11 mmol/L (ref 7–16)
BUN: 3 mg/dL — ABNORMAL LOW (ref 6–23)
CO2: 26 mmol/L (ref 22–29)
Calcium: 8.8 mg/dL (ref 8.6–10.2)
Chloride: 99 mmol/L (ref 98–107)
Creatinine: 0.5 mg/dL (ref 0.50–1.00)
Est, Glom Filt Rate: >60 mL/min/{1.73_m2} (ref >60)
Glucose: 240 mg/dL — ABNORMAL HIGH (ref 74–99)
Potassium: 3.4 mmol/L — ABNORMAL LOW (ref 3.5–5.0)
Sodium: 136 mmol/L (ref 132–146)
Total Bilirubin: 0.7 mg/dL (ref 0.0–1.2)
Total Protein: 7 g/dL (ref 6.4–8.3)

## 2022-10-14 LAB — CREATININE, RANDOM URINE: Creatinine, Ur: 64.4 mg/dL (ref 29.0–226.0)

## 2022-10-14 LAB — EKG 12-LEAD
Atrial Rate: 68 {beats}/min
P Axis: 50 degrees
P-R Interval: 216 ms
Q-T Interval: 422 ms
QRS Duration: 90 ms
QTc Calculation (Bazett): 448 ms
R Axis: -7 degrees
T Axis: 13 degrees
Ventricular Rate: 68 {beats}/min

## 2022-10-14 LAB — TYPE AND SCREEN
ABO/Rh: B POS
ABO/Rh: B POS
Antibody Screen: NEGATIVE
Antibody Screen: NEGATIVE
Unit Divison: 0

## 2022-10-14 LAB — LIPASE: Lipase: 5 U/L — ABNORMAL LOW (ref 13–60)

## 2022-10-14 LAB — URINALYSIS WITH MICROSCOPIC
Bilirubin Urine: NEGATIVE
Glucose, Ur: NEGATIVE mg/dL
Leukocyte Esterase, Urine: NEGATIVE
Nitrite, Urine: NEGATIVE
Protein, UA: NEGATIVE mg/dL
RBC, UA: 0 /HPF
Specific Gravity, UA: 1.02 (ref 1.005–1.030)
Urine Hgb: NEGATIVE
Urobilinogen, Urine: 0.2 EU/dL (ref 0.0–1.0)
WBC, UA: 0 /HPF
pH, UA: 6 (ref 5.0–9.0)

## 2022-10-14 LAB — LACTIC ACID: Lactic Acid: 1 mmol/L (ref 0.5–2.2)

## 2022-10-14 LAB — PLATELET CONFIRMATION

## 2022-10-14 LAB — SODIUM, URINE, RANDOM: Sodium,Ur: 83 mmol/L

## 2022-10-14 LAB — TROPONIN
Troponin, High Sensitivity: 23 ng/L — ABNORMAL HIGH (ref 0–9)
Troponin, High Sensitivity: 20 ng/L — ABNORMAL HIGH (ref 0–9)

## 2022-10-14 LAB — OSMOLALITY, URINE: Osmolality, Ur: 409 mOsm/kg (ref 300–900)

## 2022-10-14 MED ORDER — NORMAL SALINE FLUSH 0.9 % IV SOLN
0.9 % | INTRAVENOUS | Status: DC | PRN
Start: 2022-10-14 — End: 2022-10-20

## 2022-10-14 MED ORDER — POTASSIUM CHLORIDE CRYS ER 20 MEQ PO TBCR
20 MEQ | Freq: Once | ORAL | Status: DC
Start: 2022-10-14 — End: 2022-10-19

## 2022-10-14 MED ORDER — LISINOPRIL 20 MG PO TABS
20 MG | Freq: Every day | ORAL | Status: DC
Start: 2022-10-14 — End: 2022-10-20
  Administered 2022-10-15 – 2022-10-20 (×6): 20 mg via ORAL

## 2022-10-14 MED ORDER — MAGNESIUM SULFATE 2000 MG/50 ML IVPB PREMIX
2 GM/50ML | INTRAVENOUS | Status: DC | PRN
Start: 2022-10-14 — End: 2022-10-20

## 2022-10-14 MED ORDER — OYSTER SHELL CALCIUM 500 MG PO TABS
500 MG | Freq: Every day | ORAL | Status: DC
Start: 2022-10-14 — End: 2022-10-20
  Administered 2022-10-15 – 2022-10-20 (×6): 500 mg via ORAL

## 2022-10-14 MED ORDER — POTASSIUM BICARB-CITRIC ACID 20 MEQ PO TBEF
20 MEQ | ORAL | Status: DC | PRN
Start: 2022-10-14 — End: 2022-10-20

## 2022-10-14 MED ORDER — ONDANSETRON 4 MG PO TBDP
4 MG | Freq: Three times a day (TID) | ORAL | Status: DC | PRN
Start: 2022-10-14 — End: 2022-10-20

## 2022-10-14 MED ORDER — POTASSIUM CHLORIDE 10 MEQ/100ML IV SOLN
10 MEQ/0ML | INTRAVENOUS | Status: DC | PRN
Start: 2022-10-14 — End: 2022-10-20

## 2022-10-14 MED ORDER — ACETAMINOPHEN 650 MG RE SUPP
650 MG | Freq: Four times a day (QID) | RECTAL | Status: DC | PRN
Start: 2022-10-14 — End: 2022-10-20

## 2022-10-14 MED ORDER — ACETAMINOPHEN 325 MG PO TABS
325 MG | Freq: Four times a day (QID) | ORAL | Status: DC | PRN
Start: 2022-10-14 — End: 2022-10-20
  Administered 2022-10-15 – 2022-10-19 (×6): 650 mg via ORAL

## 2022-10-14 MED ORDER — VITAMIN D 25 MCG (1000 UT) PO TABS
25 MCG (1000 UT) | Freq: Every day | ORAL | Status: DC
Start: 2022-10-14 — End: 2022-10-20
  Administered 2022-10-15 – 2022-10-20 (×6): 2000 [IU] via ORAL

## 2022-10-14 MED ORDER — ONDANSETRON HCL 4 MG/2ML IJ SOLN
4 MG/2ML | Freq: Four times a day (QID) | INTRAMUSCULAR | Status: DC | PRN
Start: 2022-10-14 — End: 2022-10-20

## 2022-10-14 MED ORDER — LACTATED RINGERS IV SOLN
INTRAVENOUS | Status: AC
Start: 2022-10-14 — End: 2022-10-14

## 2022-10-14 MED ORDER — CARVEDILOL 25 MG PO TABS
25 MG | Freq: Two times a day (BID) | ORAL | Status: DC
Start: 2022-10-14 — End: 2022-10-20
  Administered 2022-10-15 – 2022-10-20 (×11): 25 mg via ORAL

## 2022-10-14 MED ORDER — SODIUM CHLORIDE 0.9 % IV SOLN
0.9 % | INTRAVENOUS | Status: DC | PRN
Start: 2022-10-14 — End: 2022-10-20

## 2022-10-14 MED ORDER — TOTAL B/C PO TABS
Freq: Every day | ORAL | Status: DC
Start: 2022-10-14 — End: 2022-10-20
  Administered 2022-10-15 – 2022-10-20 (×6): 1 via ORAL

## 2022-10-14 MED ORDER — NORMAL SALINE FLUSH 0.9 % IV SOLN
0.9 % | Freq: Two times a day (BID) | INTRAVENOUS | Status: DC
Start: 2022-10-14 — End: 2022-10-20
  Administered 2022-10-15 – 2022-10-20 (×10): 10 mL via INTRAVENOUS

## 2022-10-14 MED ORDER — POTASSIUM CHLORIDE CRYS ER 20 MEQ PO TBCR
20 MEQ | ORAL | Status: DC | PRN
Start: 2022-10-14 — End: 2022-10-20

## 2022-10-14 MED ORDER — PROCHLORPERAZINE MALEATE 10 MG PO TABS
10 MG | Freq: Four times a day (QID) | ORAL | Status: DC | PRN
Start: 2022-10-14 — End: 2022-10-20

## 2022-10-14 MED ORDER — ENOXAPARIN SODIUM 40 MG/0.4ML IJ SOSY
40 MG/0.4ML | Freq: Every day | INTRAMUSCULAR | Status: DC
Start: 2022-10-14 — End: 2022-10-20
  Administered 2022-10-15 – 2022-10-20 (×6): 40 mg via SUBCUTANEOUS

## 2022-10-14 NOTE — ED Provider Notes (Signed)
[facility]  Department of Emergency Medicine   EM Physician H&P                  Gabriella Blake, Gabriella Blake 82 y.o. female PMHx of malignant neoplasm of pancreas, biliary stricture, neutropenia presents to the ED c/o shortness of breath, weakness, fatigue, coughing, diarrhea, exact same issue she was admitted for yesterday.  Patient was admitted and left AMA yesterday while still waiting for bed.  She reports that she was having misunderstanding situation and is agreeable to be admitted now. Onset: Symptoms been ongoing for the past several days and worsening, went home after leaving AMA and symptoms worsened and decided come back to the hospital with her son. Location/Radiation: Generalized. Duration: Intermittent. Characterization: Flu/cold like symptoms. Aggravating Factors: History of cancer, low hemoglobin. Relieving Factors: None. Severity: Mild to moderate.       Assx Sxs: Shortness of breath, weakness, fatigue, nausea.              Review of Systems   Constitutional:  Positive for activity change, appetite change, chills and fatigue.   Respiratory:  Positive for shortness of breath. Negative for chest tightness.    Cardiovascular:  Negative for chest pain, palpitations and leg swelling.   Gastrointestinal:  Negative for abdominal distention and abdominal pain.        Physical Exam  Constitutional:       General: She is not in acute distress.     Appearance: She is not ill-appearing or toxic-appearing.   HENT:      Head: Normocephalic and atraumatic.      Right Ear: External ear normal.      Left Ear: External ear normal.      Nose: Nose normal.      Mouth/Throat:      Mouth: Mucous membranes are moist.      Pharynx: Oropharynx is clear.   Eyes:      Extraocular Movements: Extraocular movements intact.      Pupils: Pupils are equal, round, and reactive to light.   Cardiovascular:      Rate and Rhythm: Normal rate and regular rhythm.      Pulses: Normal pulses.      Heart sounds: Normal  heart sounds.   Pulmonary:      Effort: Pulmonary effort is normal. No respiratory distress.      Breath sounds: Normal breath sounds. No wheezing or rales.   Abdominal:      General: There is no distension.      Palpations: Abdomen is soft.      Tenderness: There is no abdominal tenderness.   Musculoskeletal:         General: No signs of injury. Normal range of motion.      Cervical back: Normal range of motion and neck supple.      Right lower leg: No edema.      Left lower leg: No edema.   Skin:     General: Skin is warm and dry.      Capillary Refill: Capillary refill takes less than 2 seconds.      Coloration: Skin is not jaundiced.      Findings: No bruising or lesion.   Neurological:      General: No focal deficit present.      Mental Status: She is alert and oriented to person, place, and time.      Cranial Nerves: No cranial nerve deficit.      Sensory: No sensory  deficit.      Motor: No weakness.      Coordination: Coordination normal.      Gait: Gait normal.   Psychiatric:         Mood and Affect: Mood normal.         Thought Content: Thought content normal.          Procedures     Medical Decision Making  82 year old female presents to the ER complaining of shortness of breath, weakness, fatigue, nausea.  She left the hospital yesterday AMA after being admitted.Patient's H&H actually showed improvement with a hemoglobin 8.2.  Electrolytes/kidney function within normal limits.  Alkaline phosphatase and ALT/AST are elevated.  No lactic acidosis.  Lipase low at 5.  EKG was reassuring showed no acute ischemia.  Discussed case with house medicine who agreed to admit the patient further evaluation and workup.  Patient stable and improving at time of admission.  She was agreeable to admission and reported she will not leave AMA at this time.    Amount and/or Complexity of Data Reviewed  Labs: ordered. Decision-making details documented in ED Course.  ECG/medicine tests: ordered.    Risk  Prescription drug  management.  Decision regarding hospitalization.         ED Course as of 10/14/22 0527   Thu Oct 14, 2022   0138 EKG  EKG inter by emergency physician  EKG shows normal sinus rhythm sinus arrhythmia 72 bpm.  LVH noted.  Nonspecific ST and T wave changes.  No STEMI. [CB]   0205 House medicine [JR]   0419 Alk Phos(!): 468 [JR]   0419 ALT(!): 86 [JR]   0419 AST(!): 204 [JR]      ED Course User Index  [CB] Vergie Living, DO  [JR] Lula Olszewski, DO       ED Course as of 10/14/22 0527   Thu Oct 14, 2022   0138 EKG  EKG inter by emergency physician  EKG shows normal sinus rhythm sinus arrhythmia 72 bpm.  LVH noted.  Nonspecific ST and T wave changes.  No STEMI. [CB]   0205 House medicine [JR]   0419 Alk Phos(!): 468 [JR]   0419 ALT(!): 86 [JR]   0419 AST(!): 204 [JR]      ED Course User Index  [CB] Vergie Living, DO  [JR] Lula Olszewski, DO       --------------------------------------------- PAST HISTORY ---------------------------------------------  Past Medical History:  has a past medical history of Diabetes mellitus ([place]), Endometrial cancer ([place]), Hyperlipidemia, Hypertension, and Pancreatic cancer ([place]).    Past Surgical History:  has a past surgical history that includes Cholecystectomy; Hysterectomy, total abdominal; Upper gastrointestinal endoscopy; Colonoscopy; Spine surgery; ERCP (N/A, 09/28/2022); and ERCP (N/A, 09/28/2022).    Social History:  reports that she has never smoked. She has never used smokeless tobacco. She reports current alcohol use. She reports that she does not use drugs.    Family History: family history includes Breast Cancer (age of onset: 89) in her sister; Cancer (age of onset: 12) in her maternal grandmother; Cancer (age of onset: 61) in her sister.     The patient's home medications have been reviewed.    Allergies: Molds & smuts, Nicotine, and Shellfish allergy    -------------------------------------------------- RESULTS  -------------------------------------------------    LABS:  Results for orders placed or performed during the hospital encounter of 10/14/22   CMP   Result Value Ref Range    Sodium 136 132 - 146 mmol/L  Potassium 3.4 (L) 3.5 - 5.0 mmol/L    Chloride 99 98 - 107 mmol/L    CO2 26 22 - 29 mmol/L    Anion Gap 11 7 - 16 mmol/L    Glucose 240 (H) 74 - 99 mg/dL    BUN 3 (L) 6 - 23 mg/dL    Creatinine 0.5 0.50 - 1.00 mg/dL    Est, Glom Filt Rate >60 >60 mL/min/1.62m    Calcium 8.8 8.6 - 10.2 mg/dL    Total Protein 7.0 6.4 - 8.3 g/dL    Albumin 2.7 (L) 3.5 - 5.2 g/dL    Total Bilirubin 0.7 0.0 - 1.2 mg/dL    Alkaline Phosphatase 468 (H) 35 - 104 U/L    ALT 86 (H) 0 - 32 U/L    AST 204 (H) 0 - 31 U/L   CBC with Auto Differential   Result Value Ref Range    WBC 1.8 (L) 4.5 - 11.5 k/uL    RBC 3.03 (L) 3.50 - 5.50 m/uL    Hemoglobin 8.2 (L) 11.5 - 15.5 g/dL    Hematocrit 26.3 (L) 34.0 - 48.0 %    MCV 86.8 80.0 - 99.9 fL    MCH 27.1 26.0 - 35.0 pg    MCHC 31.2 (L) 32.0 - 34.5 g/dL    RDW 15.0 11.5 - 15.0 %    MPV 10.4 7.0 - 12.0 fL    Platelet, Fluorescence 77 (L) 130 - 450 k/uL    Neutrophils % 61 43.0 - 80.0 %    Lymphocytes % 29 20.0 - 42.0 %    Monocytes % 6 2.0 - 12.0 %    Eosinophils % 2 0 - 6 %    Basophils % 1 0.0 - 2.0 %    Immature Granulocytes 1 0.0 - 5.0 %    Neutrophils Absolute 1.07 (L) 1.80 - 7.30 k/uL    Lymphocytes Absolute 0.50 (L) 1.50 - 4.00 k/uL    Monocytes Absolute 0.11 0.10 - 0.95 k/uL    Eosinophils Absolute 0.04 (L) 0.05 - 0.50 k/uL    Basophils Absolute 0.01 0.00 - 0.20 k/uL    Absolute Immature Granulocyte <0.03 0.00 - 0.58 k/uL   Lipase   Result Value Ref Range    Lipase 5 (L) 13 - 60 U/L   Lactic Acid   Result Value Ref Range    Lactic Acid 1.0 0.5 - 2.2 mmol/L   Troponin   Result Value Ref Range    Troponin, High Sensitivity 23 (H) 0 - 9 ng/L   Platelet Confirmation   Result Value Ref Range    Platelet Confirmation CONFIRMED    EKG 12 Lead   Result Value Ref Range    Ventricular Rate 68 BPM     Atrial Rate 68 BPM    P-R Interval 216 ms    QRS Duration 90 ms    Q-T Interval 422 ms    QTc Calculation (Bazett) 448 ms    P Axis 50 degrees    R Axis -7 degrees    T Axis 13 degrees       RADIOLOGY:  No orders to display         ------------------------- NURSING NOTES AND VITALS REVIEWED ---------------------------  Date / Time Roomed:  10/14/2022 12:38 AM  ED Bed Assignment:  [id number]/ST-04    The nursing notes within the ED encounter and vital signs as below have been reviewed.     Patient Vitals for the past 24  hrs:   BP Temp Temp src Pulse Resp SpO2 Height Weight   10/14/22 0035 (!) 160/96 -- -- 75 18 99 % 1.676 m ('5\' 6"'$ ) 85.7 kg (189 lb)   10/14/22 0015 -- 98.1 F (36.7 C) Temporal 80 -- 99 % -- --       Oxygen Saturation Interpretation: Normal    ------------------------------------------ PROGRESS NOTES ------------------------------------------  Re-evaluation(s):  Time: 0500  Patient's symptoms show no change  Repeat physical examination is not changed    Counseling:  I have spoken with the patient and discussed today's results, in addition to providing specific details for the plan of care and counseling regarding the diagnosis and prognosis.  Their questions are answered at this time and they are agreeable with the plan of admission.    --------------------------------- ADDITIONAL PROVIDER NOTES ---------------------------------  Consultations:  Time: 0500. Spoke with Dr. Stephens November.  Discussed case.  They will admit the patient.  This patient's ED course included: a personal history and physicial examination, re-evaluation prior to disposition, multiple bedside re-evaluations, IV medications, and cardiac monitoring    This patient has remained hemodynamically stable during their ED course.    Diagnosis:  1. Dyspnea on exertion    2. Transaminitis    3. Malignant neoplasm of pancreas, unspecified location of malignancy ([place])        Disposition:  Patient's disposition: Admit to med/surg floor  Patient's condition  is stable.

## 2022-10-14 NOTE — Progress Notes (Signed)
[facility]  Internal Medicine Department    Attending Physician Statement:  Roanna Epley, Brooke Bonito. D.O.    I have discussed the case, including pertinent history and exam findings with the resident. I have reviewed all past medical history, past surgical history, family history, social history, medications, and allergies and updated as appropriate in the history section of the chart. I have seen and examined the patient and the key elements of the encounter have been performed by me. I agree with the assessment, plan and orders as documented by the resident.      Mild COVID 19   -ID consult and remdesivir pharmacy consult cancelled as patinet is on room air, does not have a fever, and is not requiring supplemental O2   -inflamatory factors ordered; symptomatic treatment     Dehydration  -patinet has diarrhea likely 2/2 COVID   -plan for evaluation of diarrhea once patient has room and is able to give stool sample   -symptomatic management and IV hydration ordered   -replace electrolytes   -patinet has had multiple medications ordered but not given to patinet at this time; this has been discussed with ED staff and patinet is waiting for bed     Leukopenia  -likely 2/2 combination of COVID and chemotherapy for patient's pancreatic adenocarcinoma   -has had recovery since initial hospitalization   -plan to conitnue CBC after hydration to evlaute for need for stimulating factor   -heme/onc consulted for additional aid in treatment   -patinet has benign abdominal examination with bowel sounds intact and no pain or masses palpated on palpation; if patient develops worsening abdominal pain plan for CT abdomen     HTN  -patient's home BP medications ordered and awaiting ot be given     Remainder of medical problems as per resident note.

## 2022-10-14 NOTE — ED Notes (Signed)
Nurse to nurse given to [facility]

## 2022-10-14 NOTE — H&P (Signed)
[facility]  Internal Medicine Residency Program  History and Physical    Patient:  Gabriella Blake 82 y.o. female    MRN: [id number]     Date of Service: 10/14/2022    Hospital Day: 1      Chief complaint: had concerns including Shortness of Breath (Increased SOB, fatigue, coughing, diarrhea since being seen here yesterday for the same thing. Patient is COVID+).    History of Present Illness     The patient is a 82 y.o. female with a past medical history of pancreatic cancer on chemotherapy and neutropenia who presented to the emergency department again after leaving AMA yesterday for unclear reasons complaining of generalized fatigue and persistent dyspnea.  Symptoms started about 3 to 4 days ago with generalized feeling of malaise associated with nonbloody diarrhea about 2-3 episodes per day.  She also reports some vague chest pain, abdominal pain and nonproductive cough but denies nausea and vomiting.  Patient currently receives chemotherapy treatment for pancreatic cancer-gemcitabine and Taxol, last dose was 1 week ago.    In the ED, patient was hemodynamically stable and afebrile with elevated blood pressure 160/96 and on room air.  Initial laboratory workup was pertinent for hypokalemia K3.4 with elevated blood glucose 240 and slightly elevated troponin 23.  Alkaline phosphatase was elevated 468 with alcohol pattern of transaminitis AST 204 and ALT 86.  WBC was low 1.8 with absolute neutrophil count 1.07.  Hgb 8.2 and platelet count was 77 and INR 1.3.  Patient received potassium supplementation.    Past Medical History:      Diagnosis Date    Diabetes mellitus ([place])     Endometrial cancer ([place])     Hyperlipidemia     Hypertension     Pancreatic cancer [facility])        Past Surgical History:        Procedure Laterality Date    CHOLECYSTECTOMY      COLONOSCOPY      ERCP N/A 09/28/2022    ERCP STENT REMOVAL/EXCHANGE performed by Georgeann Oppenheim, MD at [facility] ENDOSCOPY    ERCP N/A 09/28/2022    ERCP  STONE REMOVAL performed by Georgeann Oppenheim, MD at [place], TOTAL ABDOMINAL (CERVIX REMOVED)      SPINE SURGERY      Plate placed in back    UPPER GASTROINTESTINAL ENDOSCOPY         Medications Prior to Admission:    Prior to Admission medications    Medication Sig Start Date End Date Taking? Authorizing Provider   furosemide (LASIX) 20 MG tablet Take 1 tablet by mouth daily    [provider]   prochlorperazine (COMPAZINE) 10 MG tablet Take 1 tablet by mouth every 6 hours as needed (n/v) 10/01/22 10/01/23  El Amil, Vernon Prey, MD   ondansetron (ZOFRAN) 4 MG tablet Take 2 tablets by mouth every 8 hours as needed for Nausea or Vomiting 10/01/22   El Amil, Vernon Prey, MD   potassium chloride (KLOR-CON M) 20 MEQ extended release tablet Take 1 tablet by mouth 2 times daily 09/20/22   El Amil, Vernon Prey, MD   carvedilol (COREG) 25 MG tablet Take 1 tablet by mouth 2 times daily    [provider]   CREON 36000-114000 units CPEP delayed release capsule Take 1 capsule by mouth 3 times daily (with meals)    [provider]   glipiZIDE (GLUCOTROL) 10 MG tablet Take 1 tablet by  mouth 2 times daily (before meals)    [provider]   traMADol (ULTRAM) 50 MG tablet Take 1 tablet by mouth every 8 hours as needed for Pain.    [provider]   lisinopril (PRINIVIL;ZESTRIL) 20 MG tablet Take 1 tablet by mouth daily    [provider]   magnesium 30 MG tablet Take 1 tablet by mouth daily    [provider]   b complex vitamins capsule Take 1 capsule by mouth daily    [provider]   Calcium Carb-Vit D-C-E-Mineral (OS-CAL ULTRA) 600 MG TABS Take 600 mg by mouth daily    [provider]   Cholecalciferol (VITAMIN D) 50 MCG (2000 UT) CAPS capsule Take 2,000 Units by mouth daily    [provider]       Allergies:  Molds & smuts, Nicotine, and Shellfish allergy    Social History:   TOBACCO:   reports that she has never smoked. She has  never used smokeless tobacco.  ETOH:   reports current alcohol use.    Family History:       Problem Relation Age of Onset    Breast Cancer Sister 49    Cancer Sister 35        throat    Cancer Maternal Grandmother 73        unknown type       REVIEW OF SYSTEMS:  Constitutional: No fever, no chills, no change in weight; (+) poor appetite  Respiratory: (+) cough, no sputum production, no pleuritic chest pain, (+) shortness of breath  Cardiology: No angina, no dyspnea on exertion, no paroxysmal nocturnal dyspnea, no orthopnea, no palpitation, (+) leg swelling.   Gastroenterology: No dysphagia, no reflux; no abdominal pain, no nausea or vomiting; (+) diarrhea. No hematochezia   Genitourinary: No dysuria, no frequency, hesitancy; no hematuria  Musculoskeletal: no joint pain, no myalgia, no change in range of movement  Neurology: no focal weakness in extremities, no slurred speech, no double vision, no tingling or numbness sensation  Endocrinology: no temperature intolerance, no polyphagia, polydipsia or polyuria  Hematology: no increased bleeding, no bruising, no lymphadenopathy  Skin: no skin changes noticed by patient  Psychology: no depressed mood, no suicidal ideation    Physical Exam     Vitals: BP (!) 160/96   Pulse 75   Temp 98.1 F (36.7 C) (Temporal)   Resp 18   Ht 1.676 m ('5\' 6"'$ )   Wt 85.7 kg (189 lb)   SpO2 99%   BMI 30.51 kg/m     I & O - 24hr: No intake/output data recorded.   General Appearance: alert, cachectic, cooperative, fatigued, and no distress  HEENT:  Head: Normal, normocephalic, atraumatic.  Neck: no JVD, supple, symmetrical, trachea midline, and thyroid not enlarged, symmetric, no tenderness/mass/nodules  Lung: clear to auscultation bilaterally  Heart: regular rate and rhythm, S1, S2 normal, no murmur, click, rub or gallop  Abdomen: soft, non-tender; bowel sounds normal; no masses,  no organomegaly  Extremities:  venous stasis dermatitis noted  Musculokeletal: No joint swelling, no  muscle tenderness. ROM normal in all joints of extremities.   Neurologic: Mental status: Alert, oriented, thought content appropriate    Labs and Imaging Studies   Basic Labs  Recent Labs     10/12/22  2137 10/13/22  0616 10/13/22  1216 10/14/22  0200   NA 130* 133  --  136   K 4.0 3.4*  --  3.4*  CL 93* 100  --  99   CO2 28 23  --  26   BUN 7 5*  --  3*   CREATININE 0.5 0.4*  --  0.5   GLUCOSE 314* 214* 255 240*   CALCIUM 8.8 8.3*  --  8.8       Recent Labs     10/12/22  2137 10/13/22  0616 10/14/22  0200   WBC 0.9* 1.2* 1.8*   RBC 3.17* 2.47* 3.03*   HGB 8.6* 6.9* 8.2*   HCT 27.3* 21.6* 26.3*   MCV 86.1 87.4 86.8   MCH 27.1 27.9 27.1   MCHC 31.5* 31.9* 31.2*   RDW 14.8 15.0 15.0   PLT 99* 71*  --    MPV 11.1 10.8 10.4       Results       Procedure Component Value Units Date/Time    Respiratory Panel, Molecular, with COVID-19 (Restricted: peds pts or suitable admitted adults) [[id number]]     Order Status: Canceled Specimen: Nasopharyngeal     Culture, Respiratory [[id number]]     Order Status: Sent Specimen: Sputum Expectorated     Respiratory Panel, Molecular, with COVID-19 (Restricted: peds pts or suitable admitted adults) [[id number]]     Order Status: Sent Specimen: Nasopharyngeal     Culture, Blood 1 [[id number]] Collected: 10/13/22 0053    Order Status: Completed Specimen: Blood Updated: 10/14/22 0215     Specimen Description .BLOOD .ARM     Special Requests          Culture NO GROWTH 1 DAY    Culture, Blood 2 [[id number]] Collected: 10/13/22 0053    Order Status: Completed Specimen: Blood Updated: 10/14/22 0215     Specimen Description .BLOOD .ARM     Special Requests          Culture NO GROWTH 1 DAY    COVID-19 & Influenza Combo [[id number]]  (Abnormal) Collected: 10/12/22 2137    Order Status: Completed Specimen: Nasopharyngeal Swab Updated: 10/12/22 2231     Specimen Description .NASOPHARYNGEAL SWAB     Source Unknown     SARS-CoV-2 RNA, RT PCR DETECTED     Comment:       Testing was performed using  [employer] SARS-CoV-2 and Influenza A/B nucleic acid assay.  This test is a multiplex Real-Time Reverse Transcriptase Polymerase Chain Reaction   (RT-PCR)-based in vitro diagnostic test intended for the qualitative detection of nucleic   acids from SARS-CoV-2,  influenza A, and influenza B in nasopharyngeal and nasal swab specimens for use under the   FDA's Emergency Use Authorization (EUA) only.        Detected results are indicative of the presence of SARS-CoV-2.  Clinical correlation with patient history and other diagnostic information is necessary to   determine patient infection status.  Positive results do not rule out bacterial infection or   co-infection with other pathogens.        Fact sheet for Patients: CartCleaning.hu  Fact sheet for Healthcare Providers: SeekRooms.cz          INFLUENZA A Not Detected     Comment: Note:  Influenza A and Influenza B negative results should be considered presumptive in   samples that have a Detected SARS-CoV-2 result.  Consider re-testing with an alternate   FDA-approved test if clinically indicated.          INFLUENZA B Not Detected     Comment: Note:  Influenza A and Influenza B negative results should be considered  presumptive in   samples that have a Detected SARS-CoV-2 result.  Consider re-testing with an alternate   FDA-approved test if clinically indicated.               Imaging Studies:  No orders to display        Resident's Assessment and Plan     Assessment and Plan:    Acute COVID-19 infection in the setting of significant immunocompromise from chemotherapy  Neutropenia 2/2 chemotherapy, patient afebrile  Transaminitis, alcoholic pattern  Thrombocytopenia  Hypertension  Hypokalemia likely from GI loss  Acute gastroenteritis  History of pancreatic adenocarcinoma June 2023, on chemo-last session 12/29  History of uterine cancer  History of DM2  History of HLD    Plan  Patient is high risk for deterioration  from COVID-19  Start on Remdesivir, consult pharmacy to dose  Repeat pancultures - blood, respiratory, urine, MRSA nares  Neutropenic isolation and precautions  Consult infectious disease to evaluate  Consult hematology oncology to evaluate  Replete electrolytes  Hold bowel regimen  Fluid resuscitation with LR 100 cc/h    PT/OT evaluation: Not indicated  DVT prophylaxis: Lovenox  GI prophylaxis: Protonix  Disposition: admit to floor    Cleone Slim, MD, PGY-2  Attending physician: Dr. Stephens November

## 2022-10-14 NOTE — ED Notes (Signed)
Patient vitals taken at this time. Patient and family express concerns regarding patient being in waiting room for 21 hours as patient is immunocompromised per family and actively receiving chemo. Apologies made to patient and family and explanation given regarding bed status and delays with patients getting rooms. This nurse spoke to [place], patient liaison, regarding patient and family frustrations. Called made by Marya Amsler to clinical manager for additional support for patient and family.

## 2022-10-14 NOTE — ED Notes (Signed)
Patient given cup for urine culture sample.

## 2022-10-14 NOTE — Other (Signed)
Informed Consent for Blood Component Transfusion Note    I have discussed with the patient the rationale for blood component transfusion; its benefits in treating or preventing fatigue, organ damage, or death; and its risk which includes mild transfusion reactions, rare risk of blood borne infection, or more serious but rare reactions. I have discussed the alternatives to transfusion, including the risk and consequences of not receiving transfusion. The patient had an opportunity to ask questions and had agreed to proceed with transfusion of blood components.    Electronically signed by Lula Olszewski, DO on 10/14/22 at 2:01 AM EST

## 2022-10-14 NOTE — ED Notes (Signed)
Patient family member found in main ED assessing beds/rooms. Spoke to charge nurse regarding patient and family becoming increasingly upset. Patient's family member states he was getting her water. Greg notified and clinical manager called again so that family can be updated regarding bed status.

## 2022-10-15 ENCOUNTER — Encounter: Payer: MEDICARE | Attending: Hematology & Oncology

## 2022-10-15 ENCOUNTER — Inpatient Hospital Stay: Payer: MEDICARE

## 2022-10-15 LAB — CBC WITH AUTO DIFFERENTIAL
Basophils %: 0 % (ref 0.0–2.0)
Basophils Absolute: 0 10*3/uL (ref 0.00–0.20)
Eosinophils %: 0 % (ref 0–6)
Eosinophils Absolute: 0 10*3/uL — ABNORMAL LOW (ref 0.05–0.50)
Hematocrit: 27.4 % — ABNORMAL LOW (ref 34.0–48.0)
Hemoglobin: 9 g/dL — ABNORMAL LOW (ref 11.5–15.5)
Lymphocytes %: 20 % (ref 20.0–42.0)
Lymphocytes Absolute: 0.48 10*3/uL — ABNORMAL LOW (ref 1.50–4.00)
MCH: 28 pg (ref 26.0–35.0)
MCHC: 32.8 g/dL (ref 32.0–34.5)
MCV: 85.4 fL (ref 80.0–99.9)
MPV: 12.9 fL — ABNORMAL HIGH (ref 7.0–12.0)
Metamyelocytes Absolute: 0.02 10*3/uL (ref 0.00–0.12)
Metamyelocytes: 1 % (ref 0–1)
Monocytes %: 6 % (ref 2.0–12.0)
Monocytes Absolute: 0.15 10*3/uL (ref 0.10–0.95)
Myelocytes Absolute: 0.02 10*3/uL — ABNORMAL HIGH
Myelocytes: 1 % — ABNORMAL HIGH
Neutrophils %: 72 % (ref 43.0–80.0)
Neutrophils Absolute: 1.73 10*3/uL — ABNORMAL LOW (ref 1.80–7.30)
Platelet, Fluorescence: 69 10*3/uL — ABNORMAL LOW (ref 130–450)
RBC: 3.21 m/uL — ABNORMAL LOW (ref 3.50–5.50)
RDW: 15.3 % — ABNORMAL HIGH (ref 11.5–15.0)
WBC: 2.4 10*3/uL — ABNORMAL LOW (ref 4.5–11.5)

## 2022-10-15 LAB — BASIC METABOLIC PANEL
Anion Gap: 9 mmol/L (ref 7–16)
BUN: 1 mg/dL — ABNORMAL LOW (ref 6–23)
CO2: 30 mmol/L — ABNORMAL HIGH (ref 22–29)
Calcium: 8.6 mg/dL (ref 8.6–10.2)
Chloride: 97 mmol/L — ABNORMAL LOW (ref 98–107)
Creatinine: 0.5 mg/dL (ref 0.50–1.00)
Est, Glom Filt Rate: >60 mL/min/{1.73_m2} (ref >60)
Glucose: 233 mg/dL — ABNORMAL HIGH (ref 74–99)
Potassium: 2.9 mmol/L — ABNORMAL LOW (ref 3.5–5.0)
Sodium: 136 mmol/L (ref 132–146)

## 2022-10-15 LAB — BASIC METABOLIC PANEL W/ REFLEX TO MG FOR LOW K
Anion Gap: 8 mmol/L (ref 7–16)
BUN: 2 mg/dL — ABNORMAL LOW (ref 6–23)
CO2: 28 mmol/L (ref 22–29)
Calcium: 8.4 mg/dL — ABNORMAL LOW (ref 8.6–10.2)
Chloride: 94 mmol/L — ABNORMAL LOW (ref 98–107)
Creatinine: 0.3 mg/dL — ABNORMAL LOW (ref 0.50–1.00)
Est, Glom Filt Rate: >60 mL/min/{1.73_m2} (ref >60)
Glucose: 287 mg/dL — ABNORMAL HIGH (ref 74–99)
Potassium: 4 mmol/L (ref 3.5–5.0)
Sodium: 130 mmol/L — ABNORMAL LOW (ref 132–146)

## 2022-10-15 LAB — POCT GLUCOSE
POC Glucose: 245 mg/dL — ABNORMAL HIGH (ref 74–99)
POC Glucose: 242 mg/dL — ABNORMAL HIGH (ref 74–99)
POC Glucose: 227 mg/dL — ABNORMAL HIGH (ref 74–99)

## 2022-10-15 LAB — PHOSPHORUS: Phosphorus: 2.5 mg/dL (ref 2.5–4.5)

## 2022-10-15 LAB — CULTURE, URINE: Culture: NO GROWTH

## 2022-10-15 LAB — MAGNESIUM: Magnesium: 1.8 mg/dL (ref 1.6–2.6)

## 2022-10-15 LAB — PLATELET CONFIRMATION

## 2022-10-15 MED ORDER — DEXTROSE 10 % IV BOLUS
INTRAVENOUS | Status: AC | PRN
Start: 2022-10-15 — End: 2022-10-20

## 2022-10-15 MED ORDER — POTASSIUM BICARB-CITRIC ACID 20 MEQ PO TBEF
20 MEQ | Freq: Once | ORAL | Status: AC
Start: 2022-10-15 — End: 2022-10-15
  Administered 2022-10-15: 21:00:00 20 meq via ORAL

## 2022-10-15 MED ORDER — DEXTROSE 10 % IV SOLN
10 % | INTRAVENOUS | Status: AC | PRN
Start: 2022-10-15 — End: 2022-10-20

## 2022-10-15 MED ORDER — INSULIN LISPRO 100 UNIT/ML IJ SOLN
100 UNIT/ML | Freq: Every evening | INTRAMUSCULAR | Status: AC
Start: 2022-10-15 — End: 2022-10-17

## 2022-10-15 MED ORDER — INSULIN LISPRO 100 UNIT/ML IJ SOLN
100 UNIT/ML | Freq: Three times a day (TID) | INTRAMUSCULAR | Status: DC
Start: 2022-10-15 — End: 2022-10-17
  Administered 2022-10-15: 22:00:00 2 [IU] via SUBCUTANEOUS
  Administered 2022-10-16: 23:00:00 4 [IU] via SUBCUTANEOUS
  Administered 2022-10-16 – 2022-10-17 (×2): 2 [IU] via SUBCUTANEOUS

## 2022-10-15 MED ORDER — HYDRALAZINE HCL 20 MG/ML IJ SOLN
20 MG/ML | INTRAMUSCULAR | Status: DC | PRN
Start: 2022-10-15 — End: 2022-10-14

## 2022-10-15 MED ORDER — INSULIN LISPRO 100 UNIT/ML IJ SOLN
100 UNIT/ML | Freq: Three times a day (TID) | INTRAMUSCULAR | Status: DC
Start: 2022-10-15 — End: 2022-10-15
  Administered 2022-10-15 (×2): 1 [IU] via SUBCUTANEOUS

## 2022-10-15 MED ORDER — INSULIN LISPRO 100 UNIT/ML IJ SOLN
100 UNIT/ML | Freq: Every evening | INTRAMUSCULAR | Status: DC
Start: 2022-10-15 — End: 2022-10-15

## 2022-10-15 MED ORDER — LABETALOL HCL 5 MG/ML IV SOLN
5 MG/ML | INTRAVENOUS | Status: DC | PRN
Start: 2022-10-15 — End: 2022-10-14

## 2022-10-15 MED ORDER — POTASSIUM BICARB-CITRIC ACID 20 MEQ PO TBEF
20 MEQ | Freq: Once | ORAL | Status: AC
Start: 2022-10-15 — End: 2022-10-15
  Administered 2022-10-15: 21:00:00 40 meq via ORAL

## 2022-10-15 MED ORDER — LABETALOL HCL 5 MG/ML IV SOLN
5 MG/ML | Freq: Four times a day (QID) | INTRAVENOUS | Status: DC | PRN
Start: 2022-10-15 — End: 2022-10-20
  Administered 2022-10-15 – 2022-10-18 (×2): 10 mg via INTRAVENOUS

## 2022-10-15 MED ORDER — POTASSIUM BICARB-CITRIC ACID 20 MEQ PO TBEF
20 MEQ | Freq: Once | ORAL | Status: AC
Start: 2022-10-15 — End: 2022-10-15
  Administered 2022-10-15: 11:00:00 20 meq via ORAL

## 2022-10-15 MED ORDER — HYDRALAZINE HCL 20 MG/ML IJ SOLN
20 MG/ML | Freq: Four times a day (QID) | INTRAMUSCULAR | Status: DC | PRN
Start: 2022-10-15 — End: 2022-10-20
  Administered 2022-10-15 – 2022-10-18 (×3): 10 mg via INTRAVENOUS

## 2022-10-15 MED ORDER — GLUCOSE 4 G PO CHEW
4 g | ORAL | Status: AC | PRN
Start: 2022-10-15 — End: 2022-10-20

## 2022-10-15 MED ORDER — GLUCAGON (RDNA) 1 MG IJ KIT
1 MG | INTRAMUSCULAR | Status: AC | PRN
Start: 2022-10-15 — End: 2022-10-20

## 2022-10-15 MED FILL — OYSTER SHELL CALCIUM 500 MG PO TABS: 500 MG | ORAL | Qty: 1

## 2022-10-15 MED FILL — HYDRALAZINE HCL 20 MG/ML IJ SOLN: 20 MG/ML | INTRAMUSCULAR | Qty: 1

## 2022-10-15 MED FILL — POTASSIUM CHLORIDE CRYS ER 20 MEQ PO TBCR: 20 MEQ | ORAL | Qty: 1

## 2022-10-15 MED FILL — PAIN & FEVER 325 MG PO TABS: 325 MG | ORAL | Qty: 2

## 2022-10-15 MED FILL — VITAMIN D3 25 MCG (1000 UT) PO TABS: 25 MCG (1000 UT) | ORAL | Qty: 2

## 2022-10-15 MED FILL — INSULIN LISPRO 100 UNIT/ML IJ SOLN: 100 UNIT/ML | INTRAMUSCULAR | Qty: 1

## 2022-10-15 MED FILL — TOTAL B/C PO TABS: ORAL | Qty: 1

## 2022-10-15 MED FILL — EFFER-K 20 MEQ PO TBEF: 20 MEQ | ORAL | Qty: 1

## 2022-10-15 MED FILL — EFFER-K 20 MEQ PO TBEF: 20 MEQ | ORAL | Qty: 2

## 2022-10-15 MED FILL — INSULIN LISPRO 100 UNIT/ML IJ SOLN: 100 UNIT/ML | INTRAMUSCULAR | Qty: 2

## 2022-10-15 MED FILL — LOVENOX 40 MG/0.4ML IJ SOSY: 40 MG/0.4ML | INTRAMUSCULAR | Qty: 0.4

## 2022-10-15 MED FILL — LISINOPRIL 20 MG PO TABS: 20 MG | ORAL | Qty: 1

## 2022-10-15 MED FILL — LABETALOL HCL 5 MG/ML IV SOLN: 5 MG/ML | INTRAVENOUS | Qty: 20

## 2022-10-15 MED FILL — CARVEDILOL 25 MG PO TABS: 25 MG | ORAL | Qty: 1

## 2022-10-15 NOTE — Progress Notes (Signed)
Orthostatic Vitals:      10/15/2022     4:51 PM   Orthostatic Vitals   Orthostatic B/P and Pulse? Yes   Blood Pressure Lying 157/85   Pulse Lying 77 PER MINUTE   Blood Pressure Sitting 150/85   Pulse Sitting 79 PER MINUTE   Blood Pressure Standing 134/71   Pulse Standing 89 PER MINUTE

## 2022-10-15 NOTE — Consults (Signed)
[facility]  [address]  [place] [address]  Dept: [phone]  Loc: [phone]  Attending Consult Note      Reason for Visit:   Pancreatic cancer, pancytopenia, COVID.    Referring Physician:  Cleone Slim, MD    PCP:  No, Pcp    History of Present Illness:      Gabriella Blake is a pleasant 82 year old lady, with a past medical history significant for uterine cancer, status post TAH, type II DM, hyperlipidemia, and hypertension, who was diagnosed with pancreatic cancer in June 2023, the patient received her care initially at [facility] health, CT scan of the abdomen and the pelvis from 03/22/2022 had revealed an ill-defined 3.5 x 2.5 cm hypodense mass and the uncinate process of the pancreas, hypodense focus in the inferior right lobe of the liver, she had an MRI done on 03/23/2022, revealing a hypoenhancing mass in the uncinate process/inferior head of the pancreas abutting the SMV with distortion of the SMV, less than 180 degree involvement by MR, pancreatic duct narrowing/obstruction, single hepatic lesion in the right hepatic lobe, small lymph node adjacent to the masslike area in the head of the pancreas, she had on 03/26/2022 and a US done, she was found to have a mass in the pancreatic head and uncinate process, FNA was consistent with adenocarcinoma.  The patient was started on systemic palliative therapy with gemcitabine and Abraxane on 04/27/2022, the patient had 3 staging CTs done on 07/13/2022, revealing decreased size of the pancreatic head mass, several new hypodense lesions in the liver, according to the patient, after review of her scans it was not felt that she had disease progression, gemcitabine and Abraxane were continued.  The patient had relocated to [facility] to be with her family, she had lost about 75 pounds, she has nausea, no abdominal pain at this time.  The patient had bili obstruction and an ERCP done on 05/07/2022, with placement of covered metal  stent, was found to have ulcerative tumor invading the duodenum,  The patient had the biliary stent exchange on 09/28/2022 by Dr. Luz Brazen.      Restating CT scans of the chest, abdomen, and pelvis, were done on 09/23/2022, which I had reviewed, she has a small 2 to 3 mm noncalcified nodules in the right upper and right mid lung, nonspecific, ill-defined fullness of the pancreatic head around 2 cm, hypoenhancing focus concerning for underlying malignancy, surrounding adenopathy without metastatic disease otherwise noted.  On 10/01/2022, chemotherapy was resumed, she received gemcitabine and Abraxane lastly on 10/08/2022.     The patient had presented to the ED on 10/12/2022 with fatigue, diarrhea and shortness of breath, she was found to have pancytopenia, white count was 1.2, hemoglobin 6.9, platelets 71K, the patient was positive for COVID, the patient left AMA.  She returned to the ED on 10/14/2022.  She is feeling better today.    Review of Systems;  CONSTITUTIONAL: No fever, chills.  Decreased appetite and energy level.  Positive for weight loss.  ENMT: Eyes: No diplopia; Nose: No epistaxis. Mouth: No sore throat.  RESPIRATORY: No hemoptysis, positive for shortness of breath, cough.   CARDIOVASCULAR: No chest pain, palpitations.  GASTROINTESTINAL: Positive for nausea, no abdominal pain, positive for diarrhea.  GENITOURINARY: No dysuria, urinary frequency, hematuria.  NEURO: No syncope, presyncope, headache.  Remainder:  ROS NEGATIVE    Past Medical History:      Diagnosis Date    Diabetes mellitus ([facility])  Endometrial cancer ([place])     Hyperlipidemia     Hypertension     Pancreatic cancer [facility])      Patient Active Problem List   Diagnosis    Malignant neoplasm of pancreas (HCC)    Biliary stricture    Neutropenia (HCC)    Shortness of breath        Past Surgical History:      Procedure Laterality Date    CHOLECYSTECTOMY      COLONOSCOPY      ERCP N/A 09/28/2022    ERCP STENT REMOVAL/EXCHANGE performed by Georgeann Oppenheim, MD at [facility] ENDOSCOPY    ERCP N/A 09/28/2022    ERCP STONE REMOVAL performed by Georgeann Oppenheim, MD at [place], TOTAL ABDOMINAL (CERVIX REMOVED)      SPINE SURGERY      Plate placed in back    UPPER GASTROINTESTINAL ENDOSCOPY         Family History:  Family History   Problem Relation Age of Onset    Breast Cancer Sister 35    Cancer Sister 31        throat    Cancer Maternal Grandmother 73        unknown type       Medications:  Reviewed and reconciled.    Social History:  Social History     Socioeconomic History    Marital status: Unknown     Spouse name: Not on file    Number of children: Not on file    Years of education: Not on file    Highest education level: Not on file   Occupational History    Not on file   Tobacco Use    Smoking status: Never    Smokeless tobacco: Never   Vaping Use    Vaping Use: Never used   Substance and Sexual Activity    Alcohol use: Yes     Comment: occassionaly    Drug use: Never    Sexual activity: Not on file   Other Topics Concern    Not on file   Social History Narrative    Not on file     Social Determinants of Health     Financial Resource Strain: Not on file   Food Insecurity: No Food Insecurity (10/15/2022)    Hunger Vital Sign     Worried About Running Out of Food in the Last Year: Never true     Ran Out of Food in the Last Year: Never true   Transportation Needs: No Transportation Needs (10/15/2022)    PRAPARE - Armed forces logistics/support/administrative officer (Medical): No     Lack of Transportation (Non-Medical): No   Physical Activity: Not on file   Stress: Not on file   Social Connections: Not on file   Intimate Partner Violence: Not on file   Housing Stability: Low Risk  (10/15/2022)    Housing Stability Vital Sign     Unable to Pay for Housing in the Last Year: No     Number of Places Lived in the Last Year: 1     Unstable Housing in the Last Year: No       Allergies:  Allergies   Allergen Reactions    Molds & Smuts     Nicotine     Shellfish Allergy       Throat seemed to close       Physical Exam:  BP Marland Kitchen)  167/81   Pulse 87   Temp 98.4 F (36.9 C) (Temporal)   Resp 16   Ht 1.676 m ('5\' 6"'$ )   Wt 85.7 kg (189 lb)   SpO2 98%   BMI 30.51 kg/m   GENERAL: Alert, oriented x 3, not in acute distress, looking tired.  HEENT: PERRLA; EOMI. Oropharynx clear.   NECK: Supple. No palpable cervical or supraclavicular lymphadenopathy.   LUNGS: Are clear.  CARDIOVASCULAR: Regular rate. No murmurs, rubs or gallops.   ABDOMEN: Soft. Non-tender, non-distended. Positive bowel sounds.  EXTREMITIES: Without clubbing, cyanosis, or edema.   NEUROLOGIC: No focal deficits.   ECOG PS 2    Impression/Plan:     Gabriella Blake is a pleasant 82 year old lady, with a past medical history significant for uterine cancer, status post TAH, type II DM, hyperlipidemia, and hypertension, who was diagnosed with pancreatic cancer in June 2023, the patient received her care initially at [facility] health, CT scan of the abdomen and the pelvis from 03/22/2022 had revealed an ill-defined 3.5 x 2.5 cm hypodense mass and the uncinate process of the pancreas, hypodense focus in the inferior right lobe of the liver, she had an MRI done on 03/23/2022, revealing a hypoenhancing mass in the uncinate process/inferior head of the pancreas abutting the SMV with distortion of the SMV, less than 180 degree involvement by MR, pancreatic duct narrowing/obstruction, single hepatic lesion in the right hepatic lobe, small lymph node adjacent to the masslike area in the head of the pancreas, she had on 03/26/2022 and a US done, she was found to have a mass in the pancreatic head and uncinate process, FNA was consistent with adenocarcinoma.  The patient was started on systemic palliative therapy with gemcitabine and Abraxane on 04/27/2022, the patient had 3 staging CTs done on 07/13/2022, revealing decreased size of the pancreatic head mass, several new hypodense lesions in the liver, according to the patient, after review of her scans  it was not felt that she had disease progression, gemcitabine and Abraxane were continued.  The patient had relocated to [facility] to be with her family, she had lost about 75 pounds, she has nausea, no abdominal pain at this time.  The patient had bili obstruction and an ERCP done on 05/07/2022, with placement of covered metal stent, was found to have ulcerative tumor invading the duodenum,  The patient had the biliary stent exchange on 09/28/2022 by Dr. Luz Brazen.      Restating CT scans of the chest, abdomen, and pelvis, were done on 09/23/2022, which I had reviewed, she has a small 2 to 3 mm noncalcified nodules in the right upper and right mid lung, nonspecific, ill-defined fullness of the pancreatic head around 2 cm, hypoenhancing focus concerning for underlying malignancy, surrounding adenopathy without metastatic disease otherwise noted.  On 10/01/2022, chemotherapy was resumed, she received gemcitabine and Abraxane lastly on 10/08/2022.     The patient had presented to the ED on 10/12/2022 with fatigue, diarrhea and shortness of breath, she was found to have pancytopenia, white count was 1.2, hemoglobin 6.9, platelets 71K, the patient was positive for COVID, the patient left AMA.  Chest x-ray is without acute abnormalities.  Labs reviewed, white count is 2.4, ANC 1730, hemoglobin 9g/DL, hematocrit 27.4, platelet count 69K.  -Pancytopenia secondary to chemotherapy and COVID, white count is improving, ANC is okay at this time, no indication for G-CSF, transfuse with packed RBCs to keep her hemoglobin greater than 7G/DL, transfuse with platelets if bleeding or platelet count is 10K  or less, or if febrile with platelet count 15 K or less.  -Continue to monitor her counts.  -Chemotherapy will be on hold for now.  -COVID-19+ , the patient is afebrile and on room air.  -PT/OT.    Thank you for allowing [place] to participate in the care of Mrs. [place].    Freda Munro, MD   HEMATOLOGY/MEDICAL ONCOLOGY  [facility]  [facility]  [place]  [place] [address]  Dept: [place]: [phone]

## 2022-10-15 NOTE — Progress Notes (Signed)
4 Eyes Skin Assessment     NAME:  Gabriella Blake  DATE OF BIRTH:  12/28/1940  MEDICAL RECORD NUMBER:  [id number]    The patient is being assessed for  Admission    I agree that at least one RN has performed a thorough Head to Toe Skin Assessment on the patient. ALL assessment sites listed below have been assessed.      Areas assessed by both nurses:    Head, Face, Ears, Shoulders, Back, Chest, Arms, Elbows, Hands, Sacrum. Buttock, Coccyx, Ischium, Legs. Feet and Heels, and Under Medical Devices         Does the Patient have a Wound? No noted wound(s)       Braden Prevention initiated by RN: Yes  Wound Care Orders initiated by RN: No    Pressure Injury (Stage 3,4, Unstageable, DTI, NWPT, and Complex wounds) if present, place Wound referral order by RN under ORDER ENTRY: No    New Ostomies, if present place, Ostomy referral order under ORDER ENTRY: No     Nurse 1 eSignature: Electronically signed by Carin Hock, RN on 10/15/22 at 2:08 AM EST    **SHARE this note so that the co-signing nurse can place an eSignature**    Nurse 2 eSignature: Electronically signed by Adria Devon, RN on 10/15/22 at 5:58 AM EST

## 2022-10-15 NOTE — Discharge Instructions (Addendum)
[facility] Internal Medicine Resident Service    Activity as tolerated  Diet: diabetic  Be compliant with your medications and take them as prescribed.    Special Instructions:   Keep isolation for 5 days due to COVID, until 10/16/22, follow by keep wearing mask for 5 days  Get blood drawn for BMP within 1 week after discharge    Follow ups  Changes in healthcare   New Medications started during this hospital stay  Take Paxlovid twice a day for 5 days  Changes to your medications  Continue taking other medication as listed  Medications you should stop taking   Stop taking Lasix due to concern of dehydration   Additional labs, testing or imaging needed after discharge   None    Hospital Follow up:   Please follow up with Primary care physician within 7-10 days after discharge, appointment request has been sent.   Please contact us if you have any concerns, wish to change or make an appointment:  Internal medicine clinic   Phone: [phone]  Fax: [phone]  [address] [place] [address]  Or please call the nurse line at [phone].  Should you have further questions in regards to this visit, you can review your clinical note and after visit summary document on your MyChart account.  Other questions can be directed to our nurse line at [phone].     Other Follow-Ups:    Future Appointments   Date Time Provider Department Center   10/22/2022  9:00 AM El Lorrin Mais, MD MED ONC [facility]   10/22/2022  9:45 AM [facility] MED ONC CHAIR 3 [facility] Med Onc [facility]   11/01/2022 11:00 AM SCHEDULE, [facility] PALLIATIVE CARE PROVIDER [facility] PALLIAT [facility]   12/20/2022 10:30 AM Cindy Hazy, APRN - CNP [facility]       Other than any new prescriptions given to you today, the list of home going meds on this After Visit Summary are based on information provided to [place] from you. This information, including the list, dose, and frequency of medications is only as accurate as the information you provided. If you have any questions or  concerns about your home medications, please contact your Primary Care Physician for further clarification.      Judeen Hammans, MD PGY-1  10/16/2022  10:48 AM

## 2022-10-15 NOTE — Progress Notes (Addendum)
Perfect serve message sent to Dr. Maudie Mercury to inquire if the port can be accessed.    Electronically signed by Lisabeth Devoid, RN on 10/15/22 at 7:58 AM EST    Perfect serve message also sent for hem/onc consult.    Electronically signed by Lisabeth Devoid, RN on 10/15/22 at 8:08 AM EST

## 2022-10-15 NOTE — Progress Notes (Cosign Needed)
[facility]  Internal Medicine Residency Program  Progress Note - House Team       Patient:  Gabriella Blake 82 y.o. female   MRN: [id number]       Date of Service: 10/15/2022  Admission date: 10/14/2022 12:38 AM ; Hospital day: 1   CC: Shortness of Breath (Increased SOB, fatigue, coughing, diarrhea since being seen here yesterday for the same thing. Patient is COVID+)     Overnight events:   -BP high, PRNS q6 BP improved  -New lab K is hemolyzed  -Pt and family becoming increasingly upset  -On MAR, dosen't look likel she got any K? Giving one of Effer K     Subjective     Pt was seen and examined at bedside this morning, she is lying in bed without acute distress, eating breakfast. She reported some headache, denied CP or SOB. Last urination was yesterday, she is able to pee by herself.     Objective       Physical Exam  Vitals: BP (!) 167/81   Pulse 87   Temp 98.4 F (36.9 C) (Temporal)   Resp 16   Ht 1.676 m (5\' 6" )   Wt 85.7 kg (189 lb)   SpO2 98%   BMI 30.51 kg/m     I & O - 24hr: No intake/output data recorded.   General Appearance: not in acute distress  HEENT: Normocephalic, atraumatic  Neck: no carotid bruit, no lymphadenopathy  Lung: mild wheezing during inspiration bilaterally  Heart: regular rate and rhythm, S1, S2 normal, no murmur  Abdomen: soft, bowel sounds normal; no masses,  no organomegaly  Extremities: extremities normal, atraumatic, no cyanosis or edema  Musculokeletal: No joint swelling, no muscle tenderness.   Neurologic: Mental status: alert and oriend x3, thoughs appropriate, speech normal    Diet:   ADULT DIET; Regular; 4 carb choices (60 gm/meal)  I & O - 24hr:  No intake or output data in the 24 hours ending 10/15/22 0941  Net IO Since Admission: No IO data has been entered for this period [10/15/22 0941]      Pertinent Labs & Imaging Studies     Labs  Recent Labs     10/12/22  2137 10/13/22  0616 10/14/22  0200 10/14/22  2223   WBC 0.9* 1.2* 1.8* 2.4*   RBC 3.17*  2.47* 3.03* 3.21*   HGB 8.6* 6.9* 8.2* 9.0*   HCT 27.3* 21.6* 26.3* 27.4*   MCV 86.1 87.4 86.8 85.4   MCH 27.1 27.9 27.1 28.0   MCHC 31.5* 31.9* 31.2* 32.8   RDW 14.8 15.0 15.0 15.3*   PLT 99* 71*  --   --    MPV 11.1 10.8 10.4 12.9*     Recent Labs     10/12/22  2137 10/13/22  0616 10/13/22  1216 10/14/22  0200 10/14/22  2223   NA 130* 133  --  136 130*   K 4.0 3.4*  --  3.4* 4.0   CL 93* 100  --  99 94*   MG  --  1.7  --   --  1.8   PHOS  --  2.5  --   --  2.5   CO2 28 23  --  26 28   BUN 7 5*  --  3* 2*   CREATININE 0.5 0.4*  --  0.5 0.3*   ANIONGAP 9 10  --  11 8   GLUCOSE 314* 214* 255 240*  287*   CALCIUM 8.8 8.3*  --  8.8 8.4*   PROT 7.2 6.4  --  7.0  --    LABALBU 3.1* 2.3*  --  2.7*  --    BILITOT 0.5 0.4  --  0.7  --    ALKPHOS 296* 248*  --  468*  --    AST 113* 87*  --  204*  --    ALT 66* 53*  --  86*  --      Recent Labs     10/14/22  0200   LACTA 1.0     Recent Labs     10/12/22  2300 10/14/22  0200 10/14/22  0711   TROPHS 19* 23* 20*     Recent Labs     10/13/22  0816   PROTIME 13.8*   INR 1.3     No results for input(s): "CKTOTAL", "CKMB", "CKMBINDEX", "TROPONINI" in the last 72 hours.  No results for input(s): "BNP" in the last 72 hours.  Recent Labs     10/13/22  0616   CHOL 115   HDL 26*   TRIG 121     Hemoglobin A1C   Date Value Ref Range Status   10/13/2022 9.9 (H) 4.0 - 5.6 % Final      No results found for: "TSH", "TSHREFLEX", "TSHFT4", "TSHELE", "TSH3GEN", "TSHHS"     No results for input(s): "PH", "PCO2", "PO2", "HCO3", "BE", "O2SAT" in the last 72 hours.    Invalid input(s): "PFRATIO"     Imaging Studies:    No orders to display          Medications     Continuous Infusions:   dextrose      sodium chloride       Scheduled Meds:   insulin lispro  0-4 Units SubCUTAneous TID WC    insulin lispro  0-4 Units SubCUTAneous Nightly    potassium chloride  20 mEq Oral Once    carvedilol  25 mg Oral BID    calcium elemental  500 mg Oral Daily    vitamin B and C  1 tablet Oral Daily    Vitamin D  2,000  Units Oral Daily    lisinopril  20 mg Oral Daily    sodium chloride flush  5-40 mL IntraVENous 2 times per day    enoxaparin  40 mg SubCUTAneous Daily     PRN Meds: glucose, dextrose bolus **OR** dextrose bolus, glucagon (rDNA), dextrose, prochlorperazine, sodium chloride flush, sodium chloride, potassium chloride **OR** potassium alternative oral replacement **OR** potassium chloride, magnesium sulfate, ondansetron **OR** ondansetron, acetaminophen **OR** acetaminophen, hydrALAZINE, labetalol    Resident's Assessment and Plan     Assessment and Plan:    COVID-19 infection in the setting of significant immunocompromise from chemotherapy  Pt was afebrile, SpO2 99% on RA  Received one dose of Zosyn and Vanc in ED  Blood Cx no growth < 24hrs  Plan:  Urine Cx pending  Resp Cx pending  MRSA pending    Transaminitis  Alk Phos 196 > 468  ALT 66 > 86  AST 113> 204    Thrombcytopenia  Plt 99 > 71  Follow CBC    Neutropenia 2/2 chemotherapy    Hypertension  On Coreg 25mg  BID  Lisinopril 20mg  daily  Lasix 20mg     Hypokalemia likely 2/2 decreased oral intake vs GI loss  Hyponatremia  FeNa 0.5% Pre-renal  Follow BMP    Hx of pancreatic adenocarcinoma  Dx on  03/2022  Last Chemo was on 10/08/22  On gemcitabine, abraxane  On Creon TID  Hemonc consult    Hx of uterine cancer  S/p TAH many years ago    Hx of DM2  On Glipizide 10mg  BID    Hx of HLD      Discharge planning:Not at this time    Tobacco abuse disorder per chart:  reports that she has never smoked. She has never used smokeless tobacco.  Alcohol abuse disorder per chart:  reports current alcohol use.  DVT prophylaxis: Lovenox  GI prophylaxis: not indicated at this time  Diet:   ADULT DIET; Regular; 4 carb choices (60 gm/meal)   Bowel regimen: Glycolax  Pain management: as needed  Code status: Full Code   Disposition: Continue Current Care  Family: updated as available    Judeen Hammans, MD, PGY-1   Attending physician: Dr. Rudi Heap

## 2022-10-15 NOTE — Care Coordination-Inpatient (Signed)
Attempted to speak with patient, resting very heavily. Called and spoke with spouse, vague on may of his responses, he may not be a reliable source of information. He tells me that patient and he recently moved to the area to be closer to family. Family has done all transportation to appointments. Patient uses a cane from time to time. She and spouse live with patient's sister, [place]. Therapy ordered today. Needs unclear at this point, will follow up based on progress.     Case Management Assessment  Initial Evaluation    Date/Time of Evaluation: 10/15/2022 3:16 PM  Assessment Completed by: Marya Fossa, [place]    If patient is discharged prior to next notation, then this note serves as note for discharge by case management.    Patient Name: Gabriella Blake                   Date of Birth: Jun 03, 1941  Diagnosis: Shortness of breath [R06.02]  Dyspnea on exertion [R06.09]  Transaminitis [R74.01]  Malignant neoplasm of pancreas, unspecified location of malignancy ([place]) [C25.9]                   Date / Time: 10/14/2022 12:38 AM    Patient Admission Status: Inpatient   Readmission Risk (Low < 19, Mod (19-27), High > 27): Readmission Risk Score: 17.5    Current PCP: No, Pcp  PCP verified by CM? No    Chart Reviewed: Yes      History Provided by: Spouse  Patient Orientation: Unable to Assess    Patient Cognition: Other (see comment)    Hospitalization in the last 30 days (Readmission):  No    If yes, Readmission Assessment in CM Navigator will be completed.    Advance Directives:      Code Status: Full Code   Patient's Primary Decision Maker is: Legal Next of Kin      Discharge Planning:    Patient lives with: Family Members Type of Home: House  Primary Care Giver: Family  Patient Support Systems include: Family Members   Current Financial resources:    Current community resources:    Current services prior to admission: None            Current DME:              Type of Home Care services:  None    ADLS  Prior functional  level: Assistance with the following:, Mobility, Shopping, Housework  Current functional level: Assistance with the following:, Mobility, Shopping, Housework    PT AM-PAC:   /24  OT AM-PAC:   /24    Family can provide assistance at DC: Yes  Would you like Case Management to discuss the discharge plan with any other family members/significant others, and if so, who? Yes (spouse)  Plans to Return to Present Housing: Unknown at present  Other Identified Issues/Barriers to RETURNING to current housing: weakness  Potential Assistance needed at discharge: N/A            Potential DME:    Patient expects to discharge to: [place] for transportation at discharge:      Financial    Payor: [place] / Plan: [employer] CHOICE-PPO MEDICARE / Product Type: *No Product type* /     Does insurance require precert for SNF: Yes    Potential assistance Purchasing Medications:    Meds-to-Beds request: Yes      [place] [facility] [place]  [facility] - P [phone] Wanda Plump [phone]  [address] [address]  Phone: [phone] Fax: [phone]      Notes:    Factors facilitating achievement of predicted outcomes: Family support    Barriers to discharge: Upper extremity weakness and Lower extremity weakness    Additional Case Management Notes:     The Plan for Transition of Care is related to the following treatment goals of Shortness of breath [R06.02]  Dyspnea on exertion [R06.09]  Transaminitis [R74.01]  Malignant neoplasm of pancreas, unspecified location of malignancy ([place]) [W97.9]    IF APPLICABLE: The Patient and/or patient representative Mychelle and her family were provided with a choice of provider and agrees with the discharge plan. Freedom of choice list with basic dialogue that supports the patient's individualized plan of care/goals and shares the quality data associated with the providers was provided to: Patient Representative   Patient Representative Name: Joyleen Haselton      The Patient and/or Patient Representative Agree with the Discharge Plan? Yes    Marya Fossa, [place]  Case Management Department  Ph: [phone] Fax:

## 2022-10-15 NOTE — Plan of Care (Signed)
Problem: Safety - Adult  Goal: Free from fall injury  Outcome: Progressing     Problem: Discharge Planning  Goal: Discharge to home or other facility with appropriate resources  Outcome: Progressing     Problem: ABCDS Injury Assessment  Goal: Absence of physical injury  Outcome: Progressing

## 2022-10-15 NOTE — Plan of Care (Signed)
Problem: Safety - Adult  Goal: Free from fall injury  10/15/2022 1115 by Lisabeth Devoid, RN  Outcome: Progressing  10/15/2022 0144 by Carin Hock, RN  Outcome: Progressing     Problem: Discharge Planning  Goal: Discharge to home or other facility with appropriate resources  10/15/2022 1115 by Lisabeth Devoid, RN  Outcome: Progressing  10/15/2022 0144 by Carin Hock, RN  Outcome: Progressing     Problem: ABCDS Injury Assessment  Goal: Absence of physical injury  10/15/2022 1115 by Lisabeth Devoid, RN  Outcome: Progressing  10/15/2022 0144 by Carin Hock, RN  Outcome: Progressing     Problem: Chronic Conditions and Co-morbidities  Goal: Patient's chronic conditions and co-morbidity symptoms are monitored and maintained or improved  Outcome: Progressing     Problem: Respiratory - Adult  Goal: Achieves optimal ventilation and oxygenation  Outcome: Progressing     Problem: Musculoskeletal - Adult  Goal: Return mobility to safest level of function  Outcome: Progressing  Goal: Return ADL status to a safe level of function  Outcome: Progressing     Problem: Gastrointestinal - Adult  Goal: Minimal or absence of nausea and vomiting  Outcome: Progressing  Goal: Maintains or returns to baseline bowel function  Outcome: Progressing  Goal: Maintains adequate nutritional intake  Outcome: Progressing     Problem: Infection - Adult  Goal: Absence of infection at discharge  Outcome: Progressing     Problem: Skin/Tissue Integrity - Adult  Goal: Skin integrity remains intact  Outcome: Progressing

## 2022-10-15 NOTE — Progress Notes (Incomplete Revision)
[facility]  Internal Medicine Residency Program  Progress Note - House Team       Patient:  Gabriella Blake 82 y.o. female   MRN: [id number]       Date of Service: 10/15/2022  Admission date: 10/14/2022 12:38 AM ; Hospital day: 1   CC: Shortness of Breath (Increased SOB, fatigue, coughing, diarrhea since being seen here yesterday for the same thing. Patient is COVID+)     Overnight events:   -BP high, PRNS q6 BP improved  -New lab K is hemolyzed  -Pt and family becoming increasingly upset  -On MAR, dosen't look likel she got any K? Giving one of Effer K     Subjective     Pt was seen and examined at bedside this morning, she is lying in bed without acute distress, eating breakfast. She reported some headache, denied CP or SOB. Last urination was yesterday, she is able to pee by herself.     Objective       Physical Exam  Vitals: BP (!) 167/81   Pulse 87   Temp 98.4 F (36.9 C) (Temporal)   Resp 16   Ht 1.676 m ('5\' 6"'$ )   Wt 85.7 kg (189 lb)   SpO2 98%   BMI 30.51 kg/m     I & O - 24hr: No intake/output data recorded.   General Appearance: not in acute distress  HEENT: Normocephalic, atraumatic  Neck: no carotid bruit, no lymphadenopathy  Lung: mild wheezing during inspiration bilaterally  Heart: regular rate and rhythm, S1, S2 normal, no murmur  Abdomen: soft, bowel sounds normal; no masses,  no organomegaly  Extremities: extremities normal, atraumatic, no cyanosis or edema  Musculokeletal: No joint swelling, no muscle tenderness.   Neurologic: Mental status: alert and oriend x3, thoughs appropriate, speech normal    Diet:   ADULT DIET; Regular; 4 carb choices (60 gm/meal)  I & O - 24hr:  No intake or output data in the 24 hours ending 10/15/22 0941  Net IO Since Admission: No IO data has been entered for this period [10/15/22 0941]      Pertinent Labs & Imaging Studies     Labs  Recent Labs     10/12/22  2137 10/13/22  0616 10/14/22  0200 10/14/22  2223   WBC 0.9* 1.2* 1.8* 2.4*   RBC 3.17*  2.47* 3.03* 3.21*   HGB 8.6* 6.9* 8.2* 9.0*   HCT 27.3* 21.6* 26.3* 27.4*   MCV 86.1 87.4 86.8 85.4   MCH 27.1 27.9 27.1 28.0   MCHC 31.5* 31.9* 31.2* 32.8   RDW 14.8 15.0 15.0 15.3*   PLT 99* 71*  --   --    MPV 11.1 10.8 10.4 12.9*     Recent Labs     10/12/22  2137 10/13/22  0616 10/13/22  1216 10/14/22  0200 10/14/22  2223   NA 130* 133  --  136 130*   K 4.0 3.4*  --  3.4* 4.0   CL 93* 100  --  99 94*   MG  --  1.7  --   --  1.8   PHOS  --  2.5  --   --  2.5   CO2 28 23  --  26 28   BUN 7 5*  --  3* 2*   CREATININE 0.5 0.4*  --  0.5 0.3*   ANIONGAP 9 10  --  11 8   GLUCOSE 314* 214* 255 240*  287*   CALCIUM 8.8 8.3*  --  8.8 8.4*   PROT 7.2 6.4  --  7.0  --    LABALBU 3.1* 2.3*  --  2.7*  --    BILITOT 0.5 0.4  --  0.7  --    ALKPHOS 296* 248*  --  468*  --    AST 113* 87*  --  204*  --    ALT 66* 53*  --  86*  --      Recent Labs     10/14/22  0200   LACTA 1.0     Recent Labs     10/12/22  2300 10/14/22  0200 10/14/22  0711   TROPHS 19* 23* 20*     Recent Labs     10/13/22  0816   PROTIME 13.8*   INR 1.3     No results for input(s): "CKTOTAL", "CKMB", "CKMBINDEX", "TROPONINI" in the last 72 hours.  No results for input(s): "BNP" in the last 72 hours.  Recent Labs     10/13/22  0616   CHOL 115   HDL 26*   TRIG 121     Hemoglobin A1C   Date Value Ref Range Status   10/13/2022 9.9 (H) 4.0 - 5.6 % Final      No results found for: "TSH", "TSHREFLEX", "TSHFT4", "TSHELE", "TSH3GEN", "TSHHS"     No results for input(s): "PH", "PCO2", "PO2", "HCO3", "BE", "O2SAT" in the last 72 hours.    Invalid input(s): "PFRATIO"     Imaging Studies:    No orders to display          Medications     Continuous Infusions:   dextrose      sodium chloride       Scheduled Meds:   insulin lispro  0-4 Units SubCUTAneous TID WC    insulin lispro  0-4 Units SubCUTAneous Nightly    potassium chloride  20 mEq Oral Once    carvedilol  25 mg Oral BID    calcium elemental  500 mg Oral Daily    vitamin B and C  1 tablet Oral Daily    Vitamin D  2,000  Units Oral Daily    lisinopril  20 mg Oral Daily    sodium chloride flush  5-40 mL IntraVENous 2 times per day    enoxaparin  40 mg SubCUTAneous Daily     PRN Meds: glucose, dextrose bolus **OR** dextrose bolus, glucagon (rDNA), dextrose, prochlorperazine, sodium chloride flush, sodium chloride, potassium chloride **OR** potassium alternative oral replacement **OR** potassium chloride, magnesium sulfate, ondansetron **OR** ondansetron, acetaminophen **OR** acetaminophen, hydrALAZINE, labetalol    Resident's Assessment and Plan     Assessment and Plan:    COVID-19 infection in the setting of significant immunocompromise from chemotherapy  Pt was afebrile, SpO2 99% on RA  Received one dose of Zosyn and Vanc in ED  Blood Cx no growth < 24hrs  Plan:  Urine Cx pending  Resp Cx pending  MRSA pending    Transaminitis  Alk Phos 196 > 468  ALT 66 > 86  AST 113> 204    Thrombcytopenia  Plt 99 > 71  Follow CBC    Neutropenia 2/2 chemotherapy    Hypertension  On Coreg '25mg'$  BID  Lisinopril '20mg'$  daily  Lasix '20mg'$     Hypokalemia likely 2/2 decreased oral intake vs GI loss  Hyponatremia  FeNa 0.5% Pre-renal  Follow BMP    Hx of pancreatic adenocarcinoma  Dx on  03/2022  Last Chemo was on 10/08/22  On gemcitabine, abraxane  On Creon TID  Hemonc consult    Hx of uterine cancer  S/p TAH many years ago    Hx of DM2  On Glipizide '10mg'$  BID    Hx of HLD      Discharge planning:Not at this time    Tobacco abuse disorder per chart:  reports that she has never smoked. She has never used smokeless tobacco.  Alcohol abuse disorder per chart:  reports current alcohol use.  DVT prophylaxis: Lovenox  GI prophylaxis: not indicated at this time  Diet:   ADULT DIET; Regular; 4 carb choices (60 gm/meal)   Bowel regimen: Glycolax  Pain management: as needed  Code status: Full Code   Disposition: Continue Current Care  Family: updated as available    Lauralee Evener, MD, PGY-1   Attending physician: Dr. Eather Colas. [facility]  Internal Medicine Residency / House Medicine Service--addendum to resident note  Attending Physician Statement:  Altamese Dilling, M.D., F.A.C.P.  Hospital charts reviewed, including other providers notes, relevant labs and imaging. Coordinating care with residents, other providers and/or counseling patient    I have seen patient/discussed the history and PE with the resident, and I agree with workup/plan and orders as documented by the resident.  (billing based on complexity of Medical decision making)  My Assessment as follows:                 Sp ER vanco/cefepime x1  Noted covid infection-- NOT pneumonia, not hypoxic     Sp chemo pancytopenia  -- pancreatic CA (no obvious mets +llymphadenopathy)- gemcytobene  +taxol  Last one week ago     Neutropenia  1000 today  Covid - not neutropenic fever  ?WBC stimulating factor     Getting blood  immunocompromised  Prior immune response to covid- ig G status  No fevers  +hot/chills     Check UA  -patient not giving [place] samples     Gi effects  Weakness generalized  Rule out presyncope  Bp up in lying-- chronic HTN  Hold lasix, concerns of dec PO intake/ACE hold- give IV fluids  Doubt aki     Liver enzymes trending-- covid, CA, sp biliary stent  Dec Alk phos- sp chemo     Dm2  Uncontrolled bS here     Risk of clotting with covid and pancreatic CA  Hgiher than risk of bleeding  Unless platelets drop below 30?  Doubt HIT, dobut GIB

## 2022-10-16 LAB — CBC WITH AUTO DIFFERENTIAL
Atypical Lymphocytes Absolute: 0.03 10*3/uL (ref 0.00–0.46)
Atypical Lymphocytes: 1 % (ref 0–4)
Basophils %: 1 % (ref 0.0–2.0)
Basophils Absolute: 0.03 10*3/uL (ref 0.00–0.20)
Eosinophils %: 0 % (ref 0–6)
Eosinophils Absolute: 0 10*3/uL — ABNORMAL LOW (ref 0.05–0.50)
Hematocrit: 24.3 % — ABNORMAL LOW (ref 34.0–48.0)
Hemoglobin: 7.8 g/dL — ABNORMAL LOW (ref 11.5–15.5)
Lymphocytes %: 14 % — ABNORMAL LOW (ref 20.0–42.0)
Lymphocytes Absolute: 0.47 10*3/uL — ABNORMAL LOW (ref 1.50–4.00)
MCH: 27.3 pg (ref 26.0–35.0)
MCHC: 32.1 g/dL (ref 32.0–34.5)
MCV: 85 fL (ref 80.0–99.9)
MPV: 10.4 fL (ref 7.0–12.0)
Monocytes %: 26 % — ABNORMAL HIGH (ref 2.0–12.0)
Monocytes Absolute: 0.89 10*3/uL (ref 0.10–0.95)
Neutrophils %: 58 % (ref 43.0–80.0)
Neutrophils Absolute: 1.98 10*3/uL (ref 1.80–7.30)
Platelets: 75 10*3/uL — ABNORMAL LOW (ref 130–450)
RBC: 2.86 m/uL — ABNORMAL LOW (ref 3.50–5.50)
RDW: 15.3 % — ABNORMAL HIGH (ref 11.5–15.0)
WBC: 3.4 10*3/uL — ABNORMAL LOW (ref 4.5–11.5)
nRBC: 1 per 100 WBC

## 2022-10-16 LAB — BASIC METABOLIC PANEL
Anion Gap: 10 mmol/L (ref 7–16)
Anion Gap: 8 mmol/L (ref 7–16)
BUN: 2 mg/dL — ABNORMAL LOW (ref 6–23)
BUN: 3 mg/dL — ABNORMAL LOW (ref 6–23)
CO2: 30 mmol/L — ABNORMAL HIGH (ref 22–29)
CO2: 29 mmol/L (ref 22–29)
Calcium: 8.3 mg/dL — ABNORMAL LOW (ref 8.6–10.2)
Calcium: 8.5 mg/dL — ABNORMAL LOW (ref 8.6–10.2)
Chloride: 95 mmol/L — ABNORMAL LOW (ref 98–107)
Chloride: 98 mmol/L (ref 98–107)
Creatinine: 0.5 mg/dL (ref 0.50–1.00)
Creatinine: 0.5 mg/dL (ref 0.50–1.00)
Est, Glom Filt Rate: >60 mL/min/{1.73_m2} (ref >60)
Est, Glom Filt Rate: >60 mL/min/{1.73_m2} (ref >60)
Glucose: 221 mg/dL — ABNORMAL HIGH (ref 74–99)
Glucose: 275 mg/dL — ABNORMAL HIGH (ref 74–99)
Potassium: 3.3 mmol/L — ABNORMAL LOW (ref 3.5–5.0)
Potassium: 3.9 mmol/L (ref 3.5–5.0)
Sodium: 135 mmol/L (ref 132–146)
Sodium: 135 mmol/L (ref 132–146)

## 2022-10-16 LAB — BASIC METABOLIC PANEL W/ REFLEX TO MG FOR LOW K
Anion Gap: 10 mmol/L (ref 7–16)
Anion Gap: 8 mmol/L (ref 7–16)
BUN: 2 mg/dL — ABNORMAL LOW (ref 6–23)
BUN: 2 mg/dL — ABNORMAL LOW (ref 6–23)
CO2: 29 mmol/L (ref 22–29)
CO2: 29 mmol/L (ref 22–29)
Calcium: 8.4 mg/dL — ABNORMAL LOW (ref 8.6–10.2)
Calcium: 8.6 mg/dL (ref 8.6–10.2)
Chloride: 96 mmol/L — ABNORMAL LOW (ref 98–107)
Chloride: 96 mmol/L — ABNORMAL LOW (ref 98–107)
Creatinine: 0.5 mg/dL (ref 0.50–1.00)
Creatinine: 0.5 mg/dL (ref 0.50–1.00)
Est, Glom Filt Rate: >60 mL/min/{1.73_m2} (ref >60)
Est, Glom Filt Rate: >60 mL/min/{1.73_m2} (ref >60)
Glucose: 168 mg/dL — ABNORMAL HIGH (ref 74–99)
Glucose: 209 mg/dL — ABNORMAL HIGH (ref 74–99)
Potassium: 3.2 mmol/L — ABNORMAL LOW (ref 3.5–5.0)
Potassium: 3.4 mmol/L — ABNORMAL LOW (ref 3.5–5.0)
Sodium: 133 mmol/L (ref 132–146)
Sodium: 135 mmol/L (ref 132–146)

## 2022-10-16 LAB — POCT GLUCOSE
POC Glucose: 168 mg/dL — ABNORMAL HIGH (ref 74–99)
POC Glucose: 248 mg/dL — ABNORMAL HIGH (ref 74–99)
POC Glucose: 292 mg/dL — ABNORMAL HIGH (ref 74–99)

## 2022-10-16 LAB — MAGNESIUM
Magnesium: 1.6 mg/dL (ref 1.6–2.6)
Magnesium: 1.7 mg/dL (ref 1.6–2.6)
Magnesium: 2.1 mg/dL (ref 1.6–2.6)

## 2022-10-16 LAB — PLATELET CONFIRMATION

## 2022-10-16 LAB — PHOSPHORUS
Phosphorus: 1.6 mg/dL — ABNORMAL LOW (ref 2.5–4.5)
Phosphorus: 2.5 mg/dL (ref 2.5–4.5)

## 2022-10-16 MED ORDER — MAGNESIUM SULFATE 2000 MG/50 ML IVPB PREMIX
2 GM/50ML | Freq: Once | INTRAVENOUS | Status: AC
Start: 2022-10-16 — End: 2022-10-16
  Administered 2022-10-16: 14:00:00 2000 mg via INTRAVENOUS

## 2022-10-16 MED ORDER — POTASSIUM BICARB-CITRIC ACID 20 MEQ PO TBEF
20 MEQ | Freq: Once | ORAL | Status: DC
Start: 2022-10-16 — End: 2022-10-16

## 2022-10-16 MED ORDER — POTASSIUM CHLORIDE CRYS ER 20 MEQ PO TBCR
20 MEQ | Freq: Once | ORAL | Status: AC
Start: 2022-10-16 — End: 2022-10-16
  Administered 2022-10-16: 20:00:00 40 meq via ORAL

## 2022-10-16 MED ORDER — POTASSIUM CHLORIDE CRYS ER 20 MEQ PO TBCR
20 MEQ | Freq: Once | ORAL | Status: AC
Start: 2022-10-16 — End: 2022-10-16
  Administered 2022-10-16: 14:00:00 40 meq via ORAL

## 2022-10-16 MED ORDER — POTASSIUM PHOSPHATE 3 MMOL/ML IV SOLN (MIXTURES ONLY)
15 | Freq: Once | INTRAVENOUS | Status: AC
Start: 2022-10-16 — End: 2022-10-16
  Administered 2022-10-16: 16:00:00 15 mmol via INTRAVENOUS

## 2022-10-16 MED ORDER — POTASSIUM BICARB-CITRIC ACID 20 MEQ PO TBEF
20 MEQ | Freq: Once | ORAL | Status: AC
Start: 2022-10-16 — End: 2022-10-15
  Administered 2022-10-16: 03:00:00 40 meq via ORAL

## 2022-10-16 MED FILL — EFFER-K 20 MEQ PO TBEF: 20 MEQ | ORAL | Qty: 2

## 2022-10-16 MED FILL — LISINOPRIL 20 MG PO TABS: 20 MG | ORAL | Qty: 1

## 2022-10-16 MED FILL — INSULIN LISPRO 100 UNIT/ML IJ SOLN: 100 UNIT/ML | INTRAMUSCULAR | Qty: 2

## 2022-10-16 MED FILL — POTASSIUM CHLORIDE CRYS ER 20 MEQ PO TBCR: 20 MEQ | ORAL | Qty: 2

## 2022-10-16 MED FILL — LOVENOX 40 MG/0.4ML IJ SOSY: 40 MG/0.4ML | INTRAMUSCULAR | Qty: 0.4

## 2022-10-16 MED FILL — TOTAL B/C PO TABS: ORAL | Qty: 1

## 2022-10-16 MED FILL — INSULIN LISPRO 100 UNIT/ML IJ SOLN: 100 UNIT/ML | INTRAMUSCULAR | Qty: 4

## 2022-10-16 MED FILL — VITAMIN D3 25 MCG (1000 UT) PO TABS: 25 MCG (1000 UT) | ORAL | Qty: 2

## 2022-10-16 MED FILL — CARVEDILOL 25 MG PO TABS: 25 MG | ORAL | Qty: 1

## 2022-10-16 MED FILL — OYSTER SHELL CALCIUM 500 MG PO TABS: 500 MG | ORAL | Qty: 1

## 2022-10-16 MED FILL — PAIN & FEVER 325 MG PO TABS: 325 MG | ORAL | Qty: 2

## 2022-10-16 MED FILL — POTASSIUM PHOSPHATES(66 MEQ K) 45 MMOLE/15ML IV SOLN: 45 MMOLE/15ML | INTRAVENOUS | Qty: 5

## 2022-10-16 MED FILL — MAGNESIUM SULFATE 2 GM/50ML IV SOLN: 2 GM/50ML | INTRAVENOUS | Qty: 50

## 2022-10-16 NOTE — Plan of Care (Signed)
After morning rounding, pt is stable for discharge from out standpoint. We planned to replace potassium then discharge the patient. Talked to RN and case Freight forwarder. Initially patient plan to go to her sister's place after discharge, however her sister's husband is s/p kidney transplant, so her sister is concerned due to patient is COVID positive. RN talked to patient's family, and case manager is going to see the patient about discharge plan. Will hold off the discharge for now.

## 2022-10-16 NOTE — Progress Notes (Signed)
Attending Physician Statement:  Altamese Dilling, M.D., F.A.C.P.  Hospital charts reviewed, including other providers notes, relevant labs and imaging. Coordinating care with residents, other providers and/or counseling patient    I have seen patient/discussed the history and PE with the resident, and I agree with workup/plan and orders as documented by the resident.  (billing based on complexity of Medical decision making)  My Assessment as follows:                 Sp ER vanco/cefepime x1  Noted covid infection-- NOT pneumonia, not hypoxic     Sp chemo pancytopenia  -- pancreatic CA (no obvious mets +llymphadenopathy)- gemcytobene  +taxol  Last one week ago     Neutropenia  1000 today  Covid - not neutropenic fever  ?WBC stimulating factor     Getting blood  immunocompromised  Prior immune response to covid- ig G status  No fevers  +hot/chills     Check UA  -patient not giving [place] samples     Gi effects  Weakness generalized  Rule out presyncope  Bp up in lying-- chronic HTN  Hold lasix, concerns of dec PO intake/ACE hold- give IV fluids  Doubt aki     Liver enzymes trending-- covid, CA, sp biliary stent  Dec Alk phos- sp chemo     Dm2  Uncontrolled bS here- off meds     Risk of clotting with covid and pancreatic CA  Hgiher than risk of bleeding  Unless platelets drop below 30?  Doubt HIT, dobut GIB    Discussed home needs  Recently staying w sister  Hyponatremia and ADH appropriate - bc dry  Oliguria improved sp IV fluids    Refeeding syndrome risk, dropping K+, Mg++ , phos  Decrease PO intake  GI malabsorption 2 chemo  Inc aldo bc dry  Rule out fanconi kidney 2 to chemo  Replete and reassess    Creon with pancreatitic CA?  ?malabsorption suspected prior   Discussed day 5 now symptomatic with covid now- OK to still prescribe paxlovid  No statins or interaction risk  Ok to stop self isolating now sp 5 days today  DC lasix QOD- over next couple months on DC while getting chemo and at risk dehydration -- OKtto reusm  ACE  Resume glipizidde  Dc time >26mn

## 2022-10-16 NOTE — Care Coordination-Inpatient (Signed)
Spoke with spouse, unsure on discharge plan. Son called and spoke with nursing today and said he does not feel it is safe for patient to return home. Spoke with patient, she tells me her daughter in [place] is her primary contact but is unsure of her phone number. She stated her son is second contact. Agreed to allow me to add to contacts. Called and spoke with son. He is concerned patient may be too weak to return home. Called therapy and they will try to see tomorrow.  Emailed SAR list to son at [email or web address] for choices. Spoke with patient and she agrees she is weak and willing to go to SAR if needed at discharge.     For questions I can be reached at [phone]. Marya Fossa, [place]

## 2022-10-16 NOTE — Progress Notes (Signed)
[facility]  Internal Medicine Residency Program  Progress Note - House Team       Patient:  Gabriella Blake 82 y.o. female   MRN: [id number]       Date of Service: 10/16/2022  Admission date: 10/14/2022 12:38 AM ; Hospital day: 2   CC: Shortness of Breath (Increased SOB, fatigue, coughing, diarrhea since being seen here yesterday for the same thing. Patient is COVID+)     Overnight events:   -BMP at 6: K 3.4, replaced electrolyte  -Hb: stable    Subjective     Pt was seen and examined at bedside this morning, she is lying in bed without acute distress, eating breakfast. She reported some headache, denied CP or SOB.     Objective       Physical Exam  Vitals: BP (!) 168/81   Pulse 76   Temp 98.5 F (36.9 C) (Oral)   Resp 17   Ht 1.676 m ('5\' 6"'$ )   Wt 85.7 kg (189 lb)   SpO2 95%   BMI 30.51 kg/m     I & O - 24hr: No intake/output data recorded.   General Appearance: not in acute distress  HEENT: Normocephalic, atraumatic  Neck: no carotid bruit, no lymphadenopathy  Lung: mild wheezing during inspiration bilaterally  Heart: regular rate and rhythm, S1, S2 normal, no murmur  Abdomen: soft, bowel sounds normal; no masses,  no organomegaly  Extremities: extremities normal, atraumatic, no cyanosis or edema  Musculokeletal: No joint swelling, no muscle tenderness.   Neurologic: Mental status: alert and oriend x3, thoughs appropriate, speech normal    Diet:   ADULT DIET; Regular; 4 carb choices (60 gm/meal)  I & O - 24hr:  No intake or output data in the 24 hours ending 10/16/22 1043  Net IO Since Admission: No IO data has been entered for this period [10/16/22 1043]      Pertinent Labs & Imaging Studies     Labs  Recent Labs     10/14/22  0200 10/14/22  2223 10/16/22  0522   WBC 1.8* 2.4* 3.4*   RBC 3.03* 3.21* 2.86*   HGB 8.2* 9.0* 7.8*   HCT 26.3* 27.4* 24.3*   MCV 86.8 85.4 85.0   MCH 27.1 28.0 27.3   MCHC 31.2* 32.8 32.1   RDW 15.0 15.3* 15.3*   PLT  --   --  75*   MPV 10.4 12.9* 10.4     Recent  Labs     10/14/22  0200 10/14/22  2223 10/15/22  1236 10/15/22  1729 10/16/22  0522   NA 136 130* 136 133 135   K 3.4* 4.0 2.9* 3.4* 3.2*   CL 99 94* 97* 96* 96*   MG  --  1.8  --  1.7 1.6   PHOS  --  2.5  --   --  1.6*   CO2 26 28 30* 29 29   BUN 3* 2* 1* 2* 2*   CREATININE 0.5 0.3* 0.5 0.5 0.5   ANIONGAP '11 8 9 8 10   '$ GLUCOSE 240* 287* 233* 209* 168*   CALCIUM 8.8 8.4* 8.6 8.6 8.4*   PROT 7.0  --   --   --   --    LABALBU 2.7*  --   --   --   --    BILITOT 0.7  --   --   --   --    ALKPHOS 468*  --   --   --   --  AST 204*  --   --   --   --    ALT 86*  --   --   --   --      Recent Labs     10/14/22  0200   LACTA 1.0     Recent Labs     10/14/22  0200 10/14/22  0711   TROPHS 23* 20*     No results for input(s): "PROTIME", "INR" in the last 72 hours.    No results for input(s): "CKTOTAL", "CKMB", "CKMBINDEX", "TROPONINI" in the last 72 hours.  No results for input(s): "BNP" in the last 72 hours.  No results for input(s): "CHOL", "HDL", "TRIG" in the last 72 hours.    Invalid input(s): "CHOLHDLR", "LDLCALCU"    Hemoglobin A1C   Date Value Ref Range Status   10/13/2022 9.9 (H) 4.0 - 5.6 % Final      No results found for: "TSH", "TSHREFLEX", "TSHFT4", "TSHELE", "TSH3GEN", "TSHHS"     No results for input(s): "PH", "PCO2", "PO2", "HCO3", "BE", "O2SAT" in the last 72 hours.    Invalid input(s): "PFRATIO"     Imaging Studies:    No orders to display          Medications     Continuous Infusions:   dextrose      sodium chloride       Scheduled Meds:   magnesium sulfate  2,000 mg IntraVENous Once    potassium phosphate IVPB (PERIPHERAL LINE)  15 mmol IntraVENous Once    insulin lispro  0-8 Units SubCUTAneous TID WC    insulin lispro  0-4 Units SubCUTAneous Nightly    potassium chloride  20 mEq Oral Once    carvedilol  25 mg Oral BID    calcium elemental  500 mg Oral Daily    vitamin B and C  1 tablet Oral Daily    Vitamin D  2,000 Units Oral Daily    lisinopril  20 mg Oral Daily    sodium chloride flush  5-40 mL  IntraVENous 2 times per day    enoxaparin  40 mg SubCUTAneous Daily     PRN Meds: glucose, dextrose bolus **OR** dextrose bolus, glucagon (rDNA), dextrose, prochlorperazine, sodium chloride flush, sodium chloride, potassium chloride **OR** potassium alternative oral replacement **OR** potassium chloride, magnesium sulfate, ondansetron **OR** ondansetron, acetaminophen **OR** acetaminophen, hydrALAZINE, labetalol    Resident's Assessment and Plan     Assessment and Plan:    COVID-19 infection in the setting of significant immunocompromise from chemotherapy  Pt was afebrile, SpO2 99% on RA  Received one dose of Zosyn and Vanc in ED  Blood Cx no growth < 24hrs  Plan:  Urine Cx pending  Resp Cx pending  MRSA pending    Transaminitis  Alk Phos 196 > 468  ALT 66 > 86  AST 113> 204    Thrombcytopenia  Plt 99 > 71  Follow CBC    Neutropenia 2/2 chemotherapy    Hypertension  On Coreg '25mg'$  BID  Lisinopril '20mg'$  daily  Lasix '20mg'$  three times a week at home    Hypokalemia likely 2/2 decreased oral intake vs GI loss  Hyponatremia  FeNa 0.5% Pre-renal  Pt had decreased urine output on admission  Follow BMP    Hx of pancreatic adenocarcinoma  Dx on 03/2022  Last Chemo was on 10/08/22  On gemcitabine, abraxane  On Creon TID  Hemonc consult: no indication for G-SCF, follow CBC and hold chemotherapy for now  Hx of uterine cancer  S/p TAH many years ago    Hx of DM2  On Glipizide '10mg'$  BID    Hx of HLD      Discharge planning: plan to discharge    Tobacco abuse disorder per chart:  reports that she has never smoked. She has never used smokeless tobacco.  Alcohol abuse disorder per chart:  reports current alcohol use.  DVT prophylaxis: Lovenox  GI prophylaxis: not indicated at this time  Diet:   ADULT DIET; Regular; 4 carb choices (60 gm/meal)   Bowel regimen: Glycolax  Pain management: as needed  Code status: Full Code   Disposition: Continue Current Care  Family: updated as available    Lauralee Evener, MD, PGY-1   Attending  physician: Dr. Roma Kayser

## 2022-10-17 DIAGNOSIS — U071 COVID-19: Secondary | ICD-10-CM

## 2022-10-17 LAB — BASIC METABOLIC PANEL W/ REFLEX TO MG FOR LOW K
Anion Gap: 9 mmol/L (ref 7–16)
Anion Gap: 13 mmol/L (ref 7–16)
BUN: 3 mg/dL — ABNORMAL LOW (ref 6–23)
BUN: 3 mg/dL — ABNORMAL LOW (ref 6–23)
CO2: 29 mmol/L (ref 22–29)
CO2: 25 mmol/L (ref 22–29)
Calcium: 8.5 mg/dL — ABNORMAL LOW (ref 8.6–10.2)
Calcium: 8.5 mg/dL — ABNORMAL LOW (ref 8.6–10.2)
Chloride: 96 mmol/L — ABNORMAL LOW (ref 98–107)
Chloride: 98 mmol/L (ref 98–107)
Creatinine: 0.5 mg/dL (ref 0.50–1.00)
Creatinine: 0.5 mg/dL (ref 0.50–1.00)
Est, Glom Filt Rate: >60 mL/min/{1.73_m2} (ref >60)
Est, Glom Filt Rate: >60 mL/min/{1.73_m2} (ref >60)
Glucose: 258 mg/dL — ABNORMAL HIGH (ref 74–99)
Glucose: 186 mg/dL — ABNORMAL HIGH (ref 74–99)
Potassium: 3.8 mmol/L (ref 3.5–5.0)
Potassium: 3.6 mmol/L (ref 3.5–5.0)
Sodium: 134 mmol/L (ref 132–146)
Sodium: 136 mmol/L (ref 132–146)

## 2022-10-17 LAB — POCT GLUCOSE
POC Glucose: 263 mg/dL — ABNORMAL HIGH (ref 74–99)
POC Glucose: 201 mg/dL — ABNORMAL HIGH (ref 74–99)
POC Glucose: 209 mg/dL — ABNORMAL HIGH (ref 74–99)
POC Glucose: 295 mg/dL — ABNORMAL HIGH (ref 74–99)

## 2022-10-17 LAB — CBC WITH AUTO DIFFERENTIAL
Absolute Immature Granulocyte: 0.12 10*3/uL (ref 0.00–0.58)
Basophils %: 0 % (ref 0.0–2.0)
Basophils Absolute: 0.01 10*3/uL (ref 0.00–0.20)
Eosinophils %: 1 % (ref 0–6)
Eosinophils Absolute: 0.04 10*3/uL — ABNORMAL LOW (ref 0.05–0.50)
Hematocrit: 24.8 % — ABNORMAL LOW (ref 34.0–48.0)
Hemoglobin: 7.9 g/dL — ABNORMAL LOW (ref 11.5–15.5)
Immature Granulocytes: 3 % (ref 0.0–5.0)
Lymphocytes %: 17 % — ABNORMAL LOW (ref 20.0–42.0)
Lymphocytes Absolute: 0.74 10*3/uL — ABNORMAL LOW (ref 1.50–4.00)
MCH: 27.3 pg (ref 26.0–35.0)
MCHC: 31.9 g/dL — ABNORMAL LOW (ref 32.0–34.5)
MCV: 85.8 fL (ref 80.0–99.9)
MPV: 10.6 fL (ref 7.0–12.0)
Monocytes %: 25 % — ABNORMAL HIGH (ref 2.0–12.0)
Monocytes Absolute: 1.09 10*3/uL — ABNORMAL HIGH (ref 0.10–0.95)
Neutrophils %: 54 % (ref 43.0–80.0)
Neutrophils Absolute: 2.3 10*3/uL (ref 1.80–7.30)
Platelets: 114 10*3/uL — ABNORMAL LOW (ref 130–450)
RBC: 2.89 m/uL — ABNORMAL LOW (ref 3.50–5.50)
RDW: 15.5 % — ABNORMAL HIGH (ref 11.5–15.0)
WBC: 4.3 10*3/uL — ABNORMAL LOW (ref 4.5–11.5)

## 2022-10-17 LAB — PHOSPHORUS: Phosphorus: 1.9 mg/dL — ABNORMAL LOW (ref 2.5–4.5)

## 2022-10-17 LAB — MAGNESIUM: Magnesium: 1.9 mg/dL (ref 1.6–2.6)

## 2022-10-17 LAB — PREALBUMIN: Prealbumin: 4 mg/dL — ABNORMAL LOW (ref 20.0–40.0)

## 2022-10-17 MED ORDER — INSULIN LISPRO 100 UNIT/ML IJ SOLN
100 UNIT/ML | Freq: Three times a day (TID) | INTRAMUSCULAR | Status: AC
Start: 2022-10-17 — End: 2022-10-20
  Administered 2022-10-17 – 2022-10-20 (×6): 4 [IU] via SUBCUTANEOUS

## 2022-10-17 MED ORDER — POTASSIUM PHOSPHATE 3 MMOL/ML IV SOLN (MIXTURES ONLY)
15 | Freq: Once | INTRAVENOUS | Status: AC
Start: 2022-10-17 — End: 2022-10-17
  Administered 2022-10-17: 18:00:00 15 mmol via INTRAVENOUS

## 2022-10-17 MED ORDER — INSULIN GLARGINE 100 UNIT/ML SC SOLN
100 UNIT/ML | Freq: Every evening | SUBCUTANEOUS | Status: AC
Start: 2022-10-17 — End: 2022-10-18
  Administered 2022-10-18: 01:00:00 10 [IU] via SUBCUTANEOUS

## 2022-10-17 MED ORDER — INSULIN LISPRO 100 UNIT/ML IJ SOLN
100 UNIT/ML | Freq: Three times a day (TID) | INTRAMUSCULAR | Status: AC
Start: 2022-10-17 — End: 2022-10-20
  Administered 2022-10-17: 18:00:00 1 [IU] via SUBCUTANEOUS
  Administered 2022-10-17: 23:00:00 2 [IU] via SUBCUTANEOUS

## 2022-10-17 MED ORDER — AMLODIPINE BESYLATE 5 MG PO TABS
5 MG | Freq: Every day | ORAL | Status: DC
Start: 2022-10-17 — End: 2022-10-19
  Administered 2022-10-17 – 2022-10-18 (×2): 5 mg via ORAL

## 2022-10-17 MED ORDER — INSULIN LISPRO 100 UNIT/ML IJ SOLN
100 UNIT/ML | Freq: Every evening | INTRAMUSCULAR | Status: AC
Start: 2022-10-17 — End: 2022-10-20
  Administered 2022-10-18: 01:00:00 4 [IU] via SUBCUTANEOUS

## 2022-10-17 MED FILL — INSULIN LISPRO 100 UNIT/ML IJ SOLN: 100 UNIT/ML | INTRAMUSCULAR | Qty: 4

## 2022-10-17 MED FILL — HYDRALAZINE HCL 20 MG/ML IJ SOLN: 20 MG/ML | INTRAMUSCULAR | Qty: 1

## 2022-10-17 MED FILL — INSULIN LISPRO 100 UNIT/ML IJ SOLN: 100 UNIT/ML | INTRAMUSCULAR | Qty: 2

## 2022-10-17 MED FILL — LISINOPRIL 20 MG PO TABS: 20 MG | ORAL | Qty: 1

## 2022-10-17 MED FILL — TOTAL B/C PO TABS: ORAL | Qty: 1

## 2022-10-17 MED FILL — OYSTER SHELL CALCIUM 500 MG PO TABS: 500 MG | ORAL | Qty: 1

## 2022-10-17 MED FILL — AMLODIPINE BESYLATE 5 MG PO TABS: 5 MG | ORAL | Qty: 1

## 2022-10-17 MED FILL — CARVEDILOL 25 MG PO TABS: 25 MG | ORAL | Qty: 1

## 2022-10-17 MED FILL — PAIN & FEVER 325 MG PO TABS: 325 MG | ORAL | Qty: 2

## 2022-10-17 MED FILL — POTASSIUM PHOSPHATES(66 MEQ K) 45 MMOLE/15ML IV SOLN: 45 MMOLE/15ML | INTRAVENOUS | Qty: 5

## 2022-10-17 MED FILL — VITAMIN D3 25 MCG (1000 UT) PO TABS: 25 MCG (1000 UT) | ORAL | Qty: 2

## 2022-10-17 MED FILL — LOVENOX 40 MG/0.4ML IJ SOSY: 40 MG/0.4ML | INTRAMUSCULAR | Qty: 0.4

## 2022-10-17 NOTE — Progress Notes (Signed)
Physical Therapy Initial Assessment     Name: Gabriella Blake  DOB: 05-15-1941  MRN: [id number]      Date of Service: 10/17/2022    Evaluating PT:  Carmela Rima, PT, DPT [phone]    Room #:  4504/4504-A  Diagnosis:  Shortness of breath [R06.02]  Dyspnea on exertion [R06.09]  Transaminitis [R74.01]  Malignant neoplasm of pancreas, unspecified location of malignancy ([place]) [C25.9]  PMHx/PSHx:    Past Medical History:   Diagnosis Date    Diabetes mellitus ([place])     Endometrial cancer ([place])     Hyperlipidemia     Hypertension     Pancreatic cancer ([place])      Procedure/Surgery:    Reason for admission: Shortness of breath [R06.02]  Dyspnea on exertion [R06.09]  Transaminitis [R74.01]  Malignant neoplasm of pancreas, unspecified location of malignancy ([place]) [C25.9]     Precautions:  COVID+/droplet+ precautions, fall risk  Equipment Needs:  TBD    SUBJECTIVE:  Pt recently moved to the area, unsure of home set up as she has not been to the home.    Pt ambulated with SPC PTA.    OBJECTIVE:   Initial Evaluation  Date: 10/17/22 Treatment Short Term/ Long Term   Goals   AM-PAC 6 Clicks 82/50     Was pt agreeable to Eval/treatment? Yes     Does pt have pain? None noted      Bed Mobility  Rolling: NT  Supine to sit: Nt  Sit to supine: NT  Scooting: NT  Rolling: ind  Supine to sit: ind  Sit to supine: ind  Scooting: ind   Transfers Sit to stand: min A  Stand to sit: min A   Stand pivot: min A SPC  Sit to stand: ind  Stand to sit: ind  Stand pivot: ind    Ambulation    2x25 feet with SPC min A  150 feet with SPC ind   Stair negotiation: ascended and descended NT  4 steps with one rail ind   ROM BUE:  Refer to OT eval   BLE:  WNL     Strength BUE:  Refer to OT eval   BLE:  4/5 grossly   4+/5 grossly    Balance Sitting EOB:  SBA  Dynamic Standing:  min A SPC  Sitting EOB:  ind  Dynamic Standing:  modI SPC     Pt is A & O x 4  Sensation:  Pt denies numbness and tingling to extremities  Edema:  none noted    Vitals:  Blood Pressure at rest  -- Blood Pressure post session --   Heart Rate at rest 80 BPM  Heart Rate post session --   SPO2 at rest 98% SPO2 post session --     Therapeutic Exercises:  none performed this visit    Patient education  Pt educated on safety with mobility    Patient response to education:   Pt verbalized understanding Pt demonstrated skill Pt requires further education in this area   x x      ASSESSMENT:    Conditions Requiring Skilled Therapeutic Intervention:    '[x]'$ Decreased strength     '[]'$ Decreased ROM  '[x]'$ Decreased functional mobility  '[x]'$ Decreased balance   '[x]'$ Decreased endurance   '[]'$ Decreased posture  '[]'$ Decreased sensation  '[]'$ Decreased coordination   '[]'$ Decreased vision  '[]'$ Decreased safety awareness   '[]'$ Increased pain       Comments:  Pt sitting EOB on arrival, agreeable to PT IE. Pt presents  with dysarthric speech and required increased time to answer questions. SpO2 >94% during session on RA. Pt required rest during session d/t weakness and fatigue, no major LOB noted with SPC during ambulation. Pt assisted to chair at end of session and left with all needs met and call light in reach.     Treatment:  Patient practiced and was instructed in the following treatment:    Bed mobility: performed with cues for safety awareness and proper hand placement to promote improved functional independence.   Transfer Training: STS, SPT performed with [facility]  Gait training: short distance ambulation performed with [facility]     Pt's/ family goals   1. Return home safely.    Prognosis is good for reaching above PT goals.    Patient and or family understand(s) diagnosis, prognosis, and plan of care.  yes    PHYSICAL THERAPY PLAN OF CARE:    PT POC is established based on physician order and patient diagnosis     Referring provider/PT Order:    10/15/22 1515  PT eval and treat  Start:  10/15/22 1515,   End:  10/15/22 1515,   ONE TIME,   Standing Count:  1 Occurrences,   R         Elpidio Galea, MD     Diagnosis:  Shortness of breath  [R06.02]  Dyspnea on exertion [R06.09]  Transaminitis [R74.01]  Malignant neoplasm of pancreas, unspecified location of malignancy ([place]) [C25.9]  Specific instructions for next treatment:  increase activity as able     Current Treatment Recommendations:     '[x]'$  Strengthening to improve independence with functional mobility   '[]'$  ROM to improve independence with functional mobility   '[x]'$  Balance Training to improve static/dynamic balance and to reduce fall risk  '[x]'$  Endurance Training to improve activity tolerance during functional mobility   '[x]'$  Transfer Training to improve safety and independence with all functional transfers   '[x]'$  Gait Training to improve gait mechanics, endurance and assess need for appropriate assistive device  '[x]'$  Stair Training in preparation for safe discharge home and/or into the community   '[x]'$  Positioning to prevent skin breakdown and contractures  '[x]'$  Safety and Education Training   '[x]'$  Patient/Caregiver Education   '[]'$  HEP  '[]'$  Other     PT long term treatment goals are located in above grid    Frequency of treatments: 2-5x/week x 1-2 weeks.    Time in  1133  Time out  1156    Total Treatment Time  10 minutes     Evaluation Time includes thorough review of current medical information, gathering information on past medical history/social history and prior level of function, completion of standardized testing/informal observation of tasks, assessment of data and education on plan of care and goals.    CPT codes:  '[x]'$  Low Complexity PT evaluation 70623  '[]'$  Moderate Complexity PT evaluation 97162  '[]'$  High Complexity PT evaluation 76283  '[]'$  PT Re-evaluation 15176  '[]'$  Gait training 97116 -- minutes  '[]'$  Manual therapy 97140 -- minutes  '[x]'$  Therapeutic activities 97530 10 minutes  '[]'$  Therapeutic exercises 97110 -- minutes  '[]'$  Neuromuscular reeducation 16073 -- minutes     Carmela Rima, PT, DPT  [phone]

## 2022-10-17 NOTE — Progress Notes (Addendum)
[facility]  [address] [place], [place]        Date:10/17/2022                                                  Patient Name: Gabriella Blake    MRN: [id number]    DOB: 1941/08/06    Room: 4504/4504-A          Evaluating OT: Selinda Flavin OTR/L; [id number]       Referring Provider: Elpidio Galea, MD     Specific Provider Orders/Date: OT Eval and Treat 10/15/22       Diagnosis: Shortness of breath; COVID-19 infection; Type 2 diabetes mellitus with hyperglycemia, without long-term current use of insulin.    Surgery: None this admission     Pertinent Medical History:  has a past medical history of Diabetes mellitus ([place]), Endometrial cancer ([place]), Hyperlipidemia, Hypertension, and Pancreatic cancer ([place]).     Recommended Adaptive Equipment: AE for LE bathing and dressing PRN     Precautions:  Fall Risk, dysarthric speech, h/o pancreatic CA, droplet+ precautions COVID-19     Assessment of current deficits    '[x]'$  Functional mobility  '[x]'$ ADLs  '[x]'$  Strength               '[]'$ Cognition    '[x]'$  Functional transfers   '[x]'$  IADLs         '[x]'$  Safety Awareness   '[x]'$ Endurance    '[x]'$  Fine Coordination              '[x]'$  Balance      '[]'$  Vision/perception   '[]'$ Sensation     '[]'$ Gross Motor Coordination  '[]'$  ROM  '[]'$  Delirium                   '[]'$  Motor Control     OT PLAN OF CARE   OT POC based on physician orders, patient diagnosis and results of clinical assessment    Frequency/Duration 1-3 days/wk for 2 weeks PRN   Specific OT Treatment Interventions to include:   * Instruction/training on adapted ADL techniques and AE recommendations to increase functional independence within precautions       * Training on energy conservation strategies, correct breathing pattern and techniques to improve independence/tolerance for self-care routine  * Functional transfer/mobility training/DME recommendations for increased independence, safety, and  fall prevention  * Patient/Family education to increase follow through with safety techniques and functional independence  * Recommendation of environmental modifications for increased safety with functional transfers/mobility and ADLs  * Therapeutic exercise to improve motor endurance, ROM, and functional strength for ADLs/functional transfers  * Therapeutic activities to facilitate/challenge dynamic balance, stand tolerance for increased safety and independence with ADLs  * Therapeutic activities to facilitate gross/fine motor skills for increased independence with ADLs  * Positioning to improve skin integrity, interaction with environment and functional independence    Home Living: Pt lives w/ husband & is in process of moving to the area therefore unsure of home setup.   Bathroom setup: Community education officer owned: [employer], [employer], [facility]    Prior Level of Function: mod I with ADLs , mod I with IADLs; engaged in functional mobility with use of  SPC  Driving: ?  Occupation: None reported  Pain Level: Pt w/ no c/o pain  Cognition: A&O: 4/4; Follows multi step directions   Memory:  Good   Sequencing:  Fair+   Problem solving:  Fair+   Judgement/safety:  Fair+     Functional Assessment:  AM-PAC Daily Activity Raw Score: 16/24   Initial Eval Status  Date: 10/17/22 Treatment Status  Date: STGs = LTGs  Time frame: 10-14 days   Feeding Setup   Independent    Grooming Stand by Assist   Modified Independence    UB Dressing Stand by Assist   To tie gown posteriorly seated EOB  Modified Independence    LB Dressing Moderate Assist   Modified Independence    Bathing Moderate Assist  Modified Independence    Toileting Minimal Assist   Simulated seated on commode  Modified Independence    Bed Mobility  Log Roll: NT  Supine to sit: NT   Sit to supine: NT   Supine to sit: Independent   Sit to supine: Independent    Functional Transfers Sit to stand:Minimal Assist   Stand to VWU:[id number] Assist  Stand pivot: Minimal Assist  Commode:  Minimal Assist  Sit to stand:Modified Independence    Stand to NWG:[id number] Independence   Stand pivot: Modified Independence   Commode: Modified Independence     Functional Mobility Minimal Assist  Use of SPC to<>from bathroom  Modified Independence w/ use of Appropriate AD   Balance Sitting:     Static - Supervision     Dynamic - SBA  Standing: Minimal Assist w/ SPC  Sitting:     Static: Independent      Dynamic: Independent   Standing: Modified Independence    Activity Tolerance Fair tolerance w/ light activity - seated rest breaks provided throughout session.  Good   Visual/  Perceptual WFL     Safety Fair+  Good  during ADL completion   Vitals SpO2 98% on RA during session.         Hand Dominance Right   AROM (PROM) Strength Additional Info:  Goal: (PRN)   RUE  Shoulder flexion ~90 degrees, distally WFL Shoulder 3-/5, distally 4-/5 grossly tested Fair grip and fair- FMC/dexterity noted during ADL tasks   Improve overall RUE strength WFL for participation in functional tasks       LUE Shoulder flexion ~90 degrees, distally WFL Shoulder 3-/5, distally 4-/5 grossly tested Fair grip and fair- FMC/dexterity noted during ADL tasks Improve overall LUE strength WFL for participation in functional tasks       Hearing: [facility]  Sensation: Pt reports h/o neuropathy BUEs/BLEs  Tone: WFL  Edema:    Comment: Upon arrival patient seated EOB and agreeable to OT session. At end of session, patient seated in bedside chair with call light and phone within reach, all lines and tubes intact.  Overall patient demonstrated decreased independence, activity tolerance, and safety during completion of ADL/functional transfer/mobility tasks. Therapist facilitated ADL tasks, functional transfers, functional mobility to address safety awareness, implementation of fall prevention strategies, & functional engagement throughout daily activities. Pt would benefit from continued skilled OT to increase safety and independence with completion of  ADL/IADL tasks for functional independence and quality of life.    Treatment: OT treatment provided this date includes:   Instruction/training on safety and adapted techniques for completion of ADLs: to increase independence and safety in self-care.   Instruction/training on safe bed mobility/functional mobility/transfer techniques: with focus on safety, body mechanics, and precautions   Instruction/training on energy conservation/work  simplification for completion of ADLs:. techniques to increase independence with self-care ADLs and IADLs, work simplification to improve endurance.   Proper Positioning/Alignment for optimal healing, skin integrity, to prevent breakdown, decrease edema, reduce risk of contracture, and encourage functional positioning for interaction with environment.  Skilled Monitoring of Vitals: to include SpO2, and HR throughout session to maximize safety.  Sitting/Standing Balance/Tolerance: to increase balance and activity tolerance during ADLs and facilitate proper posture and positioning.  Therapeutic activity: to challenge dynamic sitting/standing balance and endurance to promote safety during ADL tasks and functional transfers and mobility.    Rehab Potential: Good for established goals     LTG: maximize independence with ADLs to return to PLOF    Patient and/or family were instructed on functional diagnosis, prognosis/goals and OT plan of care. Demonstrated fair understanding.       Eval Complexity: Low  History: Expanded chart review of medical records and additional review of physical, cognitive, or psychosocial history related to current functional performance  Exam: 3+ performance deficits  Assistance/Modification: Min/mod assistance or modifications required to perform tasks. May have comorbidities that affect occupational performance.    Evaluation time includes thorough review of current medical information, gathering information on past medical & social history & PLOF, completion of  standardized testing, informal observation of tasks, consultation with other medical professions/disciplines, assessment of data & development of POC/goals.     Time In: 11:35a  Time Out: 11:58a  Total Treatment Time: 8 minutes    Min Units   OT Eval Low 97165  x     OT Eval Medium 97166      OT Eval High 97167      OT Re-Eval [id number]       Therapeutic Ex 06237       Therapeutic Activities 62831       ADL/Self Care 51761  8 1    Orthotic Management 97760       Manual 97140     Neuro Re-Ed 60737       Non-Billable Time          Evaluation Time additionally includes thorough review of current medical information, gathering information on past medical history/social history and prior level of function, interpretation of standardized testing/informal observation of tasks, assessment of data and development of plan of care and goals.            Siddhi Dornbush OTR/L; [id number]

## 2022-10-17 NOTE — Progress Notes (Signed)
[facility]  Internal Medicine Residency Program  Progress Note - House Team       Patient:  Gabriella Blake 82 y.o. female   MRN: [id number]       Date of Service: 10/17/2022  Admission date: 10/14/2022 12:38 AM ; Hospital day: 3   CC: Shortness of Breath (Increased SOB, fatigue, coughing, diarrhea since being seen here yesterday for the same thing. Patient is COVID+)     Overnight events:   -K 3.9, Mag 2.1, Phos 2.5  -Pt request supplemental nutrition drink-ordered, prealbumin ordered    Subjective     Pt was seen and examined at bedside this morning, not in acute distress. She denied headache or dizziness, CP or SOB, some weakness, denied nausea or vomiting.     Objective       Physical Exam  Vitals: BP (!) 163/69   Pulse 78   Temp 98.6 F (37 C) (Temporal)   Resp 16   Ht 1.676 m ('5\' 6"'$ )   Wt 85.7 kg (189 lb)   SpO2 95%   BMI 30.51 kg/m     I & O - 24hr: No intake/output data recorded.   General Appearance: not in acute distress  HEENT: Normocephalic, atraumatic  Neck: no carotid bruit, no lymphadenopathy  Lung: clear to ausculation bilaterally   Heart: regular rate and rhythm, S1, S2 normal, no murmur  Abdomen: soft, bowel sounds normal; no masses,  no organomegaly  Extremities: extremities normal, atraumatic, no cyanosis or edema  Musculokeletal: No joint swelling, no muscle tenderness.   Neurologic: Mental status: alert and oriend x3, thoughs appropriate, mild difficult speech    Diet:   ADULT DIET; Regular; 3 carb choices (45 gm/meal)  ADULT ORAL NUTRITION SUPPLEMENT; Breakfast, Lunch, Dinner; Diabetic Oral Supplement  I & O - 24hr:    Intake/Output Summary (Last 24 hours) at 10/17/2022 1043  Last data filed at 10/16/2022 2000  Gross per 24 hour   Intake 360 ml   Output --   Net 360 ml     Net IO Since Admission: 480 mL [10/17/22 1043]      Pertinent Labs & Imaging Studies     Labs  Recent Labs     10/14/22  2223 10/16/22  0522 10/17/22  0530   WBC 2.4* 3.4* 4.3*   RBC 3.21* 2.86* 2.89*    HGB 9.0* 7.8* 7.9*   HCT 27.4* 24.3* 24.8*   MCV 85.4 85.0 85.8   MCH 28.0 27.3 27.3   MCHC 32.8 32.1 31.9*   RDW 15.3* 15.3* 15.5*   PLT  --  75* 114*   MPV 12.9* 10.4 10.6     Recent Labs     10/14/22  2223 10/15/22  1236 10/15/22  1729 10/16/22  0522 10/16/22  1026 10/16/22  1611 10/16/22  1936 10/17/22  0530   NA 130*   < > 133 135 135 135 134 136   K 4.0   < > 3.4* 3.2* 3.3* 3.9 3.8 3.6   CL 94*   < > 96* 96* 95* 98 96* 98   MG 1.8  --  1.7 1.6  --  2.1  --  1.9   PHOS 2.5  --   --  1.6*  --  2.5  --  1.9*   CO2 28   < > 29 29 30* '29 29 25   '$ BUN 2*   < > 2* 2* 2* 3* 3* 3*   CREATININE 0.3*   < >  0.5 0.5 0.5 0.5 0.5 0.5   ANIONGAP 8   < > '8 10 10 8 9 13   '$ GLUCOSE 287*   < > 209* 168* 221* 275* 258* 186*   CALCIUM 8.4*   < > 8.6 8.4* 8.3* 8.5* 8.5* 8.5*    < > = values in this interval not displayed.     No results for input(s): "LACTA" in the last 72 hours.    No results for input(s): "TROPHS", "PROBNP" in the last 72 hours.    No results for input(s): "PROTIME", "INR" in the last 72 hours.    No results for input(s): "CKTOTAL", "CKMB", "CKMBINDEX", "TROPONINI" in the last 72 hours.  No results for input(s): "BNP" in the last 72 hours.  No results for input(s): "CHOL", "HDL", "TRIG" in the last 72 hours.    Invalid input(s): "CHOLHDLR", "LDLCALCU"    Hemoglobin A1C   Date Value Ref Range Status   10/13/2022 9.9 (H) 4.0 - 5.6 % Final      No results found for: "TSH", "TSHREFLEX", "TSHFT4", "TSHELE", "TSH3GEN", "TSHHS"     No results for input(s): "PH", "PCO2", "PO2", "HCO3", "BE", "O2SAT" in the last 72 hours.    Invalid input(s): "PFRATIO"     Imaging Studies:    No orders to display          Medications     Continuous Infusions:   dextrose      sodium chloride       Scheduled Meds:   insulin glargine  10 Units SubCUTAneous Nightly    insulin lispro  0-4 Units SubCUTAneous TID WC    insulin lispro  0-4 Units SubCUTAneous Nightly    potassium chloride  20 mEq Oral Once    carvedilol  25 mg Oral BID    calcium  elemental  500 mg Oral Daily    vitamin B and C  1 tablet Oral Daily    Vitamin D  2,000 Units Oral Daily    lisinopril  20 mg Oral Daily    sodium chloride flush  5-40 mL IntraVENous 2 times per day    enoxaparin  40 mg SubCUTAneous Daily     PRN Meds: glucose, dextrose bolus **OR** dextrose bolus, glucagon (rDNA), dextrose, prochlorperazine, sodium chloride flush, sodium chloride, potassium chloride **OR** potassium alternative oral replacement **OR** potassium chloride, magnesium sulfate, ondansetron **OR** ondansetron, acetaminophen **OR** acetaminophen, hydrALAZINE, labetalol    Resident's Assessment and Plan     Assessment and Plan:    COVID-19 infection in the setting of significant immunocompromise from chemotherapy  Pt was afebrile, SpO2 99% on RA  Received one dose of Zosyn and Vanc in ED  Blood Cx no growth < 24hrs  Plan:  MRSA pending  Pt saturate well on RA, not required oxygen    Poor nutrition  Prealbumin 4.0  Pt needs assistant with eating  On nutrition supplement drink  Consulted dietitian     Transaminitis  Alk Phos 196 > 468  ALT 66 > 86  AST 113> 204  Follow CMP daily    Thrombcytopenia  Plt 99 > 71 > 114  Follow CBC    Neutropenia 2/2 chemotherapy    Hypertension  On Coreg '25mg'$  BID  Lisinopril '20mg'$  daily  Lasix '20mg'$  three times a week at home - on hold d/t concern of dehydration    BP not well controlled, started amlodipine    Hypokalemia likely 2/2 decreased oral intake vs GI loss -resolved   Hyponatremia -resolved  FeNa 0.5% Pre-renal  Pt had decreased urine output on admission  Follow BMP    Hx of pancreatic adenocarcinoma  Dx on 03/2022  Last Chemo was on 10/08/22  On gemcitabine, abraxane  On Creon TID  Hemonc consult: no indication for G-SCF, follow CBC and hold chemotherapy for now    Hx of uterine cancer  S/p TAH many years ago    Hx of DM2  On Glipizide '10mg'$  BID at home  A1c 9.9, not well controlled  Started Lantus 10U and LDSS ACHS  Diabetes education  Monitor BG    Hx of  HLD      Discharge planning: Discharge to [facility]    Tobacco abuse disorder per chart:  reports that she has never smoked. She has never used smokeless tobacco.  Alcohol abuse disorder per chart:  reports current alcohol use.  DVT prophylaxis: Lovenox  GI prophylaxis: not indicated at this time  Diet:   ADULT DIET; Regular; 3 carb choices (45 gm/meal)  ADULT ORAL NUTRITION SUPPLEMENT; Breakfast, Lunch, Dinner; Diabetic Oral Supplement   Bowel regimen: Glycolax  Pain management: as needed  Code status: Full Code   Disposition: Continue Current Care  Family: updated as available    Lauralee Evener, MD, PGY-1   Attending physician: Dr. Maryjane Hurter

## 2022-10-17 NOTE — Progress Notes (Signed)
[facility]  [address]  [place] [address]  Dept: [phone]  Loc: [phone]  Attending Progress Note      Reason for Visit:   Pancreatic cancer, pancytopenia, COVID.    Referring Physician:  Cleone Slim, MD    PCP:  No, Pcp        Subjective:  Patient is feeling better. Still has cough, but non-productive.   No fevers overnight.     HPI from Initial Inpatient Consultation (10/15/22):      [name] is a pleasant 82 year old lady, with a past medical history significant for uterine cancer, status post TAH, type II DM, hyperlipidemia, and hypertension, who was diagnosed with pancreatic cancer in June 2023, the patient received her care initially at [facility] health, CT scan of the abdomen and the pelvis from 03/22/2022 had revealed an ill-defined 3.5 x 2.5 cm hypodense mass and the uncinate process of the pancreas, hypodense focus in the inferior right lobe of the liver, she had an MRI done on 03/23/2022, revealing a hypoenhancing mass in the uncinate process/inferior head of the pancreas abutting the SMV with distortion of the SMV, less than 180 degree involvement by MR, pancreatic duct narrowing/obstruction, single hepatic lesion in the right hepatic lobe, small lymph node adjacent to the masslike area in the head of the pancreas, she had on 03/26/2022 and a US done, she was found to have a mass in the pancreatic head and uncinate process, FNA was consistent with adenocarcinoma.  The patient was started on systemic palliative therapy with gemcitabine and Abraxane on 04/27/2022, the patient had 3 staging CTs done on 07/13/2022, revealing decreased size of the pancreatic head mass, several new hypodense lesions in the liver, according to the patient, after review of her scans it was not felt that she had disease progression, gemcitabine and Abraxane were continued.  The patient had relocated to [facility] to be with her family, she had lost about 75 pounds, she has nausea,  no abdominal pain at this time.  The patient had bili obstruction and an ERCP done on 05/07/2022, with placement of covered metal stent, was found to have ulcerative tumor invading the duodenum,  The patient had the biliary stent exchange on 09/28/2022 by Dr. Luz Brazen.      Restating CT scans of the chest, abdomen, and pelvis, were done on 09/23/2022, which I had reviewed, she has a small 2 to 3 mm noncalcified nodules in the right upper and right mid lung, nonspecific, ill-defined fullness of the pancreatic head around 2 cm, hypoenhancing focus concerning for underlying malignancy, surrounding adenopathy without metastatic disease otherwise noted.  On 10/01/2022, chemotherapy was resumed, she received gemcitabine and Abraxane lastly on 10/08/2022.     The patient had presented to the ED on 10/12/2022 with fatigue, diarrhea and shortness of breath, she was found to have pancytopenia, white count was 1.2, hemoglobin 6.9, platelets 71K, the patient was positive for COVID, the patient left AMA.  She returned to the ED on 10/14/2022.  She is feeling better today.    Review of Systems;  Negative except as noted above.    Past Medical History:      Diagnosis Date    Diabetes mellitus ([place])     Endometrial cancer ([place])     Hyperlipidemia     Hypertension     Pancreatic cancer [facility])      Patient Active Problem List   Diagnosis    Malignant neoplasm of pancreas (  [place])    Biliary stricture    Neutropenia (HCC)    Shortness of breath    Hypokalemia    COVID-19 virus infection    Type 2 diabetes mellitus with hyperglycemia, without long-term current use of insulin ([place])        Past Surgical History:      Procedure Laterality Date    CHOLECYSTECTOMY      COLONOSCOPY      ERCP N/A 09/28/2022    ERCP STENT REMOVAL/EXCHANGE performed by Georgeann Oppenheim, MD at [place]    ERCP N/A 09/28/2022    ERCP STONE REMOVAL performed by Georgeann Oppenheim, MD at [place], TOTAL ABDOMINAL (CERVIX REMOVED)      SPINE SURGERY       Plate placed in back    UPPER GASTROINTESTINAL ENDOSCOPY         Family History:  Family History   Problem Relation Age of Onset    Breast Cancer Sister 37    Cancer Sister 51        throat    Cancer Maternal Grandmother 73        unknown type       Medications:  Reviewed and reconciled.    Social History:  Social History     Socioeconomic History    Marital status: Unknown     Spouse name: Not on file    Number of children: Not on file    Years of education: Not on file    Highest education level: Not on file   Occupational History    Not on file   Tobacco Use    Smoking status: Never    Smokeless tobacco: Never   Vaping Use    Vaping Use: Never used   Substance and Sexual Activity    Alcohol use: Yes     Comment: occassionaly    Drug use: Never    Sexual activity: Not on file   Other Topics Concern    Not on file   Social History Narrative    Not on file     Social Determinants of Health     Financial Resource Strain: Not on file   Food Insecurity: No Food Insecurity (10/15/2022)    Hunger Vital Sign     Worried About Running Out of Food in the Last Year: Never true     Ran Out of Food in the Last Year: Never true   Transportation Needs: No Transportation Needs (10/15/2022)    PRAPARE - Armed forces logistics/support/administrative officer (Medical): No     Lack of Transportation (Non-Medical): No   Physical Activity: Not on file   Stress: Not on file   Social Connections: Not on file   Intimate Partner Violence: Not on file   Housing Stability: Low Risk  (10/15/2022)    Housing Stability Vital Sign     Unable to Pay for Housing in the Last Year: No     Number of Places Lived in the Last Year: 1     Unstable Housing in the Last Year: No       Allergies:  Allergies   Allergen Reactions    Molds & Smuts     Nicotine     Shellfish Allergy      Throat seemed to close       Physical Exam:  BP (!) 145/67   Pulse 76   Temp 98.8 F (37.1 C) (Temporal)  Resp 23   Ht 1.676 m ('5\' 6"'$ )   Wt 85.7 kg (189 lb)   SpO2 97%   BMI 30.51 kg/m    GENERAL: Alert, oriented x 3, not in acute distress, looking tired.  HEENT: PERRLA; EOMI. Oropharynx clear.   NECK: Supple. Normal range of motion.   LUNGS: Clear sounds, without significant wheezes of rhonchi.   CARDIOVASCULAR: Regular rate. No murmurs, rubs or gallops.   ABDOMEN: Soft. Non-tender, non-distended.  No ascites.  EXTREMITIES: Without clubbing, cyanosis, or edema.   NEUROLOGIC: No focal deficits.   ECOG PS 2    Impression/Plan:     [name] is a pleasant 82 year old lady, with a past medical history significant for uterine cancer, status post TAH, type II DM, hyperlipidemia, and hypertension, who was diagnosed with pancreatic cancer in June 2023, the patient received her care initially at [facility] health, CT scan of the abdomen and the pelvis from 03/22/2022 had revealed an ill-defined 3.5 x 2.5 cm hypodense mass and the uncinate process of the pancreas, hypodense focus in the inferior right lobe of the liver, she had an MRI done on 03/23/2022, revealing a hypoenhancing mass in the uncinate process/inferior head of the pancreas abutting the SMV with distortion of the SMV, less than 180 degree involvement by MR, pancreatic duct narrowing/obstruction, single hepatic lesion in the right hepatic lobe, small lymph node adjacent to the masslike area in the head of the pancreas, she had on 03/26/2022 and a US done, she was found to have a mass in the pancreatic head and uncinate process, FNA was consistent with adenocarcinoma.  The patient was started on systemic palliative therapy with gemcitabine and Abraxane on 04/27/2022, the patient had 3 staging CTs done on 07/13/2022, revealing decreased size of the pancreatic head mass, several new hypodense lesions in the liver, according to the patient, after review of her scans it was not felt that she had disease progression, gemcitabine and Abraxane were continued.  The patient had relocated to [facility] to be with her family, she had lost about 75 pounds, she has  nausea, no abdominal pain at this time.  The patient had bili obstruction and an ERCP done on 05/07/2022, with placement of covered metal stent, was found to have ulcerative tumor invading the duodenum,  The patient had the biliary stent exchange on 09/28/2022 by Dr. Luz Brazen.      Restating CT scans of the chest, abdomen, and pelvis, were done on 09/23/2022, which I had reviewed, she has a small 2 to 3 mm noncalcified nodules in the right upper and right mid lung, nonspecific, ill-defined fullness of the pancreatic head around 2 cm, hypoenhancing focus concerning for underlying malignancy, surrounding adenopathy without metastatic disease otherwise noted.  On 10/01/2022, chemotherapy was resumed, she received gemcitabine and Abraxane lastly on 10/08/2022.     The patient had presented to the ED on 10/12/2022 with fatigue, diarrhea and shortness of breath, she was found to have pancytopenia, white count was 1.2, hemoglobin 6.9, platelets 71K, the patient was positive for COVID, the patient left AMA.  Chest x-ray is without acute abnormalities.  Labs reviewed, white count is 2.4, ANC 1730, hemoglobin 9g/DL, hematocrit 27.4, platelet count 69K.      -Pancytopenia secondary to chemotherapy and COVID, white count is improving, ANC normalized, transfuse with packed RBCs to keep her hemoglobin greater than 7G/DL, transfuse with platelets if bleeding or platelet count is 10K or less, or if febrile with platelet count 15 K or less. Platelets are  uptrending.  -Continue to monitor her counts.  -Chemotherapy will be on hold for now.  -Scheduled for follow up with Dr. Cline Crock on 10/22/22  -COVID-19+ , the patient is afebrile and on room air.  -PT/OT.    Thank you for allowing [place] to participate in the care of [name].    Jani Gravel, MD  Medical Oncology  [place]  10/17/22 3:47 PM

## 2022-10-18 ENCOUNTER — Ambulatory Visit: Payer: Self-pay | Admitting: Nurse Practitioner

## 2022-10-18 LAB — CULTURE, BLOOD 1: Culture: NO GROWTH

## 2022-10-18 LAB — COMPREHENSIVE METABOLIC PANEL W/ REFLEX TO MG FOR LOW K
ALT: 97 U/L — ABNORMAL HIGH (ref 0–32)
AST: 143 U/L — ABNORMAL HIGH (ref 0–31)
Albumin: 2.5 g/dL — ABNORMAL LOW (ref 3.5–5.2)
Alkaline Phosphatase: 460 U/L — ABNORMAL HIGH (ref 35–104)
Anion Gap: 13 mmol/L (ref 7–16)
BUN: 5 mg/dL — ABNORMAL LOW (ref 6–23)
CO2: 26 mmol/L (ref 22–29)
Calcium: 8.4 mg/dL — ABNORMAL LOW (ref 8.6–10.2)
Chloride: 101 mmol/L (ref 98–107)
Creatinine: 0.5 mg/dL (ref 0.50–1.00)
Est, Glom Filt Rate: >60 mL/min/{1.73_m2} (ref >60)
Glucose: 82 mg/dL (ref 74–99)
Potassium: 3.5 mmol/L (ref 3.5–5.0)
Sodium: 140 mmol/L (ref 132–146)
Total Bilirubin: 1.2 mg/dL (ref 0.0–1.2)
Total Protein: 7 g/dL (ref 6.4–8.3)

## 2022-10-18 LAB — CULTURE, BLOOD 2: Culture: NO GROWTH

## 2022-10-18 LAB — CBC
Hematocrit: 27.4 % — ABNORMAL LOW (ref 34.0–48.0)
Hemoglobin: 8.7 g/dL — ABNORMAL LOW (ref 11.5–15.5)
MCH: 27.2 pg (ref 26.0–35.0)
MCHC: 31.8 g/dL — ABNORMAL LOW (ref 32.0–34.5)
MCV: 85.6 fL (ref 80.0–99.9)
MPV: 10.1 fL (ref 7.0–12.0)
Platelets: 188 10*3/uL (ref 130–450)
RBC: 3.2 m/uL — ABNORMAL LOW (ref 3.50–5.50)
RDW: 15.6 % — ABNORMAL HIGH (ref 11.5–15.0)
WBC: 5.1 10*3/uL (ref 4.5–11.5)

## 2022-10-18 LAB — POCT GLUCOSE
POC Glucose: 333 mg/dL — ABNORMAL HIGH (ref 74–99)
POC Glucose: 162 mg/dL — ABNORMAL HIGH (ref 74–99)
POC Glucose: 186 mg/dL — ABNORMAL HIGH (ref 74–99)

## 2022-10-18 LAB — MAGNESIUM: Magnesium: 1.9 mg/dL (ref 1.6–2.6)

## 2022-10-18 LAB — PHOSPHORUS: Phosphorus: 3 mg/dL (ref 2.5–4.5)

## 2022-10-18 MED ORDER — LIDOCAINE 4 % EX PTCH
4 | Freq: Every day | CUTANEOUS | Status: DC
Start: 2022-10-18 — End: 2022-10-20
  Administered 2022-10-18 – 2022-10-20 (×3): 1 via TRANSDERMAL

## 2022-10-18 MED ORDER — INSULIN GLARGINE 100 UNIT/ML SC SOLN
100 UNIT/ML | Freq: Every evening | SUBCUTANEOUS | Status: AC
Start: 2022-10-18 — End: 2022-10-20
  Administered 2022-10-20: 02:00:00 8 [IU] via SUBCUTANEOUS

## 2022-10-18 MED ORDER — INSULIN GLARGINE 100 UNIT/ML SC SOLN
100 UNIT/ML | Freq: Every evening | SUBCUTANEOUS | Status: DC
Start: 2022-10-18 — End: 2022-10-18

## 2022-10-18 MED FILL — INSULIN LISPRO 100 UNIT/ML IJ SOLN: 100 UNIT/ML | INTRAMUSCULAR | Qty: 4

## 2022-10-18 MED FILL — ASPERCREME LIDOCAINE 4 % EX PTCH: 4 % | CUTANEOUS | Qty: 1

## 2022-10-18 MED FILL — LISINOPRIL 20 MG PO TABS: 20 MG | ORAL | Qty: 1

## 2022-10-18 MED FILL — VITAMIN D3 25 MCG (1000 UT) PO TABS: 25 MCG (1000 UT) | ORAL | Qty: 2

## 2022-10-18 MED FILL — CARVEDILOL 25 MG PO TABS: 25 MG | ORAL | Qty: 1

## 2022-10-18 MED FILL — LOVENOX 40 MG/0.4ML IJ SOSY: 40 MG/0.4ML | INTRAMUSCULAR | Qty: 0.4

## 2022-10-18 MED FILL — TOTAL B/C PO TABS: ORAL | Qty: 1

## 2022-10-18 MED FILL — OYSTER SHELL CALCIUM 500 MG PO TABS: 500 MG | ORAL | Qty: 1

## 2022-10-18 MED FILL — PAIN & FEVER 325 MG PO TABS: 325 MG | ORAL | Qty: 2

## 2022-10-18 MED FILL — LABETALOL HCL 5 MG/ML IV SOLN: 5 MG/ML | INTRAVENOUS | Qty: 20

## 2022-10-18 MED FILL — HYDRALAZINE HCL 20 MG/ML IJ SOLN: 20 MG/ML | INTRAMUSCULAR | Qty: 1

## 2022-10-18 MED FILL — AMLODIPINE BESYLATE 5 MG PO TABS: 5 MG | ORAL | Qty: 1

## 2022-10-18 MED FILL — INSULIN GLARGINE 100 UNIT/ML SC SOLN: 100 UNIT/ML | SUBCUTANEOUS | Qty: 10

## 2022-10-18 NOTE — Progress Notes (Signed)
Comprehensive Nutrition Assessment    Type and Reason for Visit:  Initial, Consult (ONS rec's)    Nutrition Recommendations/Plan:   Continue Diet.    Will Continue Current ONS and monitor.       Malnutrition Assessment:  Malnutrition Status:  Insufficient data (10/18/22 1429)    Context:  Acute Illness     Findings of the 6 clinical characteristics of malnutrition:  Energy Intake:  50% or less of estimated energy requirements for 5 or more days  Weight Loss:  Unable to assess (2/2 poor EMR wt hx both since adm as well as pta at this time)     Body Fat Loss:  Unable to assess (2/2 CV19+ Iso)     Muscle Mass Loss:  Unable to assess    Fluid Accumulation:  Unable to assess (multi-factorial)    Grip Strength:  Not Performed    Nutrition Assessment:    Pt initially to ED ~1d pta, however left AMA and returned w/ ongoing symptoms of malaise/diarrhea, cough, chest/abd pain and poor app/intake x ~3-4d pta.  PMHx DM, HTN, HLD, hx uterine/endometrial CA, Pancreatic CA on chemo (dx 03/2022), s/p ERCP w/ stone removal and stent exchange (09/2022).  Adm w/ CV19+, DOE, dehydration, hypokalemia/natremia, risk for Refeeding Syndrome d/t lytes imbalances.  Chemo noted to be held at this time.  At risk d/t possible wt loss w/ poor app/intake and increased needs 2/2 CA w/ chemo and CV19+.  ReFeeding Risk, monitor/correct lytes labs PRN.  Will Continue Current ONS and monitor.    Nutrition Related Findings:    A&O, dentition WNL, Abd/BS WDL, +diarrhea, +1 edema, I/O's WNL, elevated BGL/LFTs Wound Type: None       Current Nutrition Intake & Therapies:    Average Meal Intake: 26-50%  Average Supplements Intake: Unable to assess  ADULT DIET; Regular; 3 carb choices (45 gm/meal)  ADULT ORAL NUTRITION SUPPLEMENT; Breakfast, Lunch, Dinner; Diabetic Oral Supplement    Anthropometric Measures:  Height: 167.6 cm ('5\' 6"'$ )  Ideal Body Weight (IBW): 130 lbs (59 kg)    Admission Body Weight: 85.7 kg (189 lb) (stated 1/4)  Current Body Weight: 85.7 kg  (189 lb) (stated 1/4; UTO CBW 2/2 CV19+ at this time),   IBW. Weight Source: Stated  Current BMI (kg/m2): 30.5  Usual Body Weight: 88 kg (194 lb) (actual 09/17/22 per EMR; UTA wt change properly d/t poor EMR wt hx both since adm as well as pta at this time)  % Weight Change (Calculated): -2.6  Weight Adjustment For: No Adjustment                 BMI Categories: Obese Class 1 (BMI 30.0-34.9)    Estimated Daily Nutrient Needs:  Energy Requirements Based On: Formula  Weight Used for Energy Requirements: Current  Energy (kcal/day): MSJx1.3SF per CBW= 17-1900  Weight Used for Protein Requirements: Ideal  Protein (g/day): 1.5-1.8gm/kg IBW= 90-105  Method Used for Fluid Requirements: 1 ml/kcal  Fluid (ml/day): 17-1900    Nutrition Diagnosis:   Inadequate oral intake related to catabolic illness (2/2 CA w/ CV19+) as evidenced by intake 26-50%, poor intake prior to admission, GI abnormality, diarrhea    Nutrition Interventions:   Food and/or Nutrient Delivery: Continue Current Diet, Continue Oral Nutrition Supplement  Nutrition Education/Counseling: Education not indicated  Coordination of Nutrition Care: Continue to monitor while inpatient       Goals:     Goals: PO intake 50% or greater, by next RD assessment  Nutrition Monitoring and Evaluation:   Behavioral-Environmental Outcomes: None Identified  Food/Nutrient Intake Outcomes: Food and Nutrient Intake, Supplement Intake  Physical Signs/Symptoms Outcomes: Biochemical Data, Diarrhea, GI Status, Fluid Status or Edema, Nutrition Focused Physical Findings, Skin, Weight    Discharge Planning:    Continue Oral Nutrition Supplement     Rosalita Chessman, RD, LD  Contact: ext [phone]

## 2022-10-18 NOTE — Plan of Care (Signed)
Problem: Safety - Adult  Goal: Free from fall injury  Outcome: Progressing     Problem: Discharge Planning  Goal: Discharge to home or other facility with appropriate resources  Outcome: Progressing  Flowsheets (Taken 10/17/2022 1930)  Discharge to home or other facility with appropriate resources: Identify barriers to discharge with patient and caregiver     Problem: Chronic Conditions and Co-morbidities  Goal: Patient's chronic conditions and co-morbidity symptoms are monitored and maintained or improved  Recent Flowsheet Documentation  Taken 10/17/2022 1930 by Trenton Founds, RN  Care Plan - Patient's Chronic Conditions and Co-Morbidity Symptoms are Monitored and Maintained or Improved: Monitor and assess patient's chronic conditions and comorbid symptoms for stability, deterioration, or improvement     Problem: Musculoskeletal - Adult  Goal: Return mobility to safest level of function  Outcome: Progressing  Flowsheets (Taken 10/17/2022 1930)  Return Mobility to Safest Level of Function:   Assess patient stability and activity tolerance for standing, transferring and ambulating with or without assistive devices   Assist with transfers and ambulation using safe patient handling equipment as needed     Problem: Musculoskeletal - Adult  Goal: Return ADL status to a safe level of function  Outcome: Progressing  Flowsheets (Taken 10/17/2022 1930)  Return ADL Status to a Safe Level of Function: Administer medication as ordered     Problem: Gastrointestinal - Adult  Goal: Maintains adequate nutritional intake  Outcome: Progressing  Flowsheets (Taken 10/17/2022 1930)  Maintains adequate nutritional intake: Monitor percentage of each meal consumed     Problem: Infection - Adult  Goal: Absence of infection at discharge  Outcome: Progressing  Flowsheets (Taken 10/17/2022 1930)  Absence of infection at discharge:   Assess and monitor for signs and symptoms of infection   Monitor lab/diagnostic results   Monitor all insertion sites  i.e., indwelling lines, tubes and drains     Problem: Skin/Tissue Integrity - Adult  Goal: Skin integrity remains intact  Outcome: Progressing  Flowsheets  Taken 10/17/2022 2217  Skin Integrity Remains Intact: Monitor for areas of redness and/or skin breakdown  Taken 10/17/2022 1930  Skin Integrity Remains Intact: Monitor for areas of redness and/or skin breakdown

## 2022-10-18 NOTE — Discharge Instructions (Addendum)
Continuity of Care Form    Patient Name: Gabriella Blake   DOB:  August 10, 1941  MRN:  [id number]    Admit date:  10/14/2022  Discharge date:  ***    Code Status Order: Full Code   Advance Directives:     Admitting Physician:  Elpidio Galea, MD  PCP: No, Pcp    Discharging Nurse: [facility] Unit/Room#: 4504/4504-A  Discharging Unit Phone Number: ***    Emergency Contact:   Extended Emergency Contact Information  Primary Emergency Contact: [place] Phone: [phone]  Relation: Spouse  Interpreter needed? No  Secondary Emergency Contact: [place] Phone: [phone]  Relation: Brother/Sister  Interpreter needed? No    Past Surgical History:  Past Surgical History:   Procedure Laterality Date    CHOLECYSTECTOMY      COLONOSCOPY      ERCP N/A 09/28/2022    ERCP STENT REMOVAL/EXCHANGE performed by Georgeann Oppenheim, MD at [facility] ENDOSCOPY    ERCP N/A 09/28/2022    ERCP STONE REMOVAL performed by Georgeann Oppenheim, MD at [place], TOTAL ABDOMINAL (CERVIX REMOVED)      SPINE SURGERY      Plate placed in back    UPPER GASTROINTESTINAL ENDOSCOPY         Immunization History:     There is no immunization history on file for this patient.    Active Problems:  Patient Active Problem List   Diagnosis Code    Malignant neoplasm of pancreas ([place]) C25.9    Biliary stricture K83.1    Neutropenia (HCC) D70.9    Dyspnea on exertion R06.09    Hypokalemia E87.6    COVID-19 virus infection U07.1    Type 2 diabetes mellitus with hyperglycemia, without long-term current use of insulin ([facility]) E11.65       Isolation/Infection:   Isolation            Droplet Plus          Patient Infection Status       Infection Onset Added Last Indicated Last Indicated By Review Planned Expiration Resolved Resolved By    COVID-19 10/12/22 10/12/22 10/12/22 COVID-19 & Influenza Combo 10/22/22 10/26/22                         Nurse Assessment:  Last Vital Signs: BP (!) 173/79   Pulse 74   Temp 97.9 F (36.6 C)  (Temporal)   Resp 20   Ht 1.676 m ('5\' 6"'$ )   Wt 85.7 kg (189 lb)   SpO2 98%   BMI 30.51 kg/m     Last documented pain score (0-10 scale): Pain Level: 5  Last Weight:   Wt Readings from Last 1 Encounters:   10/14/22 85.7 kg (189 lb)     Mental Status:  {IP PT MENTAL STATUS:20030}    IV Access:  {MH COC IV ACCESS:304088262}    Nursing Mobility/ADLs:  Walking   {CHP DME UMPN:361443154}  Transfer  {CHP DME MGQQ:761950932}  Bathing  {CHP DME IZTI:458099833}  Dressing  {CHP DME ASNK:539767341}  Toileting  {CHP DME PFXT:024097353}  Feeding  {CHP DME GDJM:426834196}  Med Admin  {CHP DME QIWL:798921194}  Med Delivery   {MH COC MED Delivery:304088264}    Wound Care Documentation and Therapy:        Elimination:  Continence:   Bowel: {YES / RD:40814}  Bladder: {YES / GY:18563}  Urinary Catheter: {Urinary Catheter:304088013}  Colostomy/Ileostomy/Ileal Conduit: {YES / WG:95621}       Date of Last BM: ***    Intake/Output Summary (Last 24 hours) at 10/18/2022 1020  Last data filed at 10/17/2022 1300  Gross per 24 hour   Intake 120 ml   Output --   Net 120 ml     I/O last 3 completed shifts:  In: 360 [P.O.:360]  Out: -     Safety Concerns:     [place] Concerns:304088272}    Impairments/Disabilities:      [place] COC Impairments/Disabilities:304088273}    Nutrition Therapy:  Current Nutrition Therapy:   [place] COC Diet List:304088271}    Routes of Feeding: {CHP DME Other Feedings:304088042}  Liquids: {Slp liquid thickness:30034}  Daily Fluid Restriction: {CHP DME Yes amt example:304088041}  Last Modified Barium Swallow with Video (Video Swallowing Test): {Done Not Done HYQM:578469629}    Treatments at the Time of Hospital Discharge:   Respiratory Treatments: ***  Oxygen Therapy:  {Therapy; copd oxygen:17808}  Ventilator:    {MH CC Vent BMWU:132440102}    Rehab Therapies: {THERAPEUTIC INTERVENTION:6058788905}  Weight Bearing Status/Restrictions: [place] CC Weight Bearing:304508812}  Other Medical Equipment (for information only, NOT a  DME order):  {EQUIPMENT:304520077}  Other Treatments: ***    Patient's personal belongings (please select all that are sent with patient):  {CHP DME Belongings:304088044}    RN SIGNATURE:  {Esignature:304088025}    CASE MANAGEMENT/SOCIAL WORK SECTION    Inpatient Status Date: 10/14/22    Readmission Risk Assessment Score:  Readmission Risk              Risk of Unplanned Readmission:  29           Discharging to Facility/ Agency   Name: [place]  [facility] (if applicable)   Name:  Address:  Dialysis Schedule:  Phone:  Fax:    Case Manager/Social Worker signature: Electronically signed by Evalee Jefferson, RN on 10/18/22 at 10:21 AM EST    PHYSICIAN SECTION    Prognosis: Fair    Condition at Discharge: Stable    Rehab Potential (if transferring to Rehab): Fair    Recommended Labs or Other Treatments After Discharge:    Continue taking other medication as listed  Medications you should stop taking : Stop taking Lasix due to concern of dehydration     Physician Certification: I certify the above information and transfer of Gabriella Blake  is necessary for the continuing treatment of the diagnosis listed and that she requires [place] care.      Update Admission H&P:  Gabriella Blake is an 82 year old woman with PMH of HTN, HLD, pancreatic adenocarcinoma diagnosed in June 2023 currently undergoing chemotherapy, uterine cancer s/p hysterectomy who initially presented to the hospital due to complaints of generalized weakness weakness, fatigue, dyspnea, generalized malaise. She was initially admitted, but left AMA and subsequently came back. Prior to presentation patient recently had her chemotherapy session on 12/29.  Patient performed COVID testing at home and found to be positive. Of note, on admission the patient was also found to be Covid-19 positive. However, patient remained on room air and was not in respiratory distress. Of note,  patient was managed symptomatically.  Patient was stable to be discharged, however, given the fact that family member is s/p kidney transplant patient was deemed high risk to transmit the virus to someone who is immunocompromised. Therefore, after PT/OT assessment patient will be transferred to SNF for  rehab. The symptoms that she was admitted with are resolve, no diarrhea reported, fatigue and generalized malaise are resolved.    PHYSICIAN SIGNATURE:  Electronically signed by Lauralee Evener, MD on 10/20/22 at 11:15 AM EST

## 2022-10-18 NOTE — ACP (Advance Care Planning) (Signed)
Advance Care Planning   Healthcare Decision Maker:    Primary Decision Maker: Gabriella, Blake - Child - [phone]    Secondary Decision Maker: Gabriella, Blake - Spouse - [phone]    Click here to complete Healthcare Decision Makers including selection of the Healthcare Decision Maker Relationship (ie "Primary").

## 2022-10-18 NOTE — Progress Notes (Signed)
[facility]  [address]  [place] [address]  Dept: [phone]  Loc: [phone]  Attending Progress Note      Reason for Visit:   Pancreatic cancer, pancytopenia, COVID.    Referring Physician:  Cleone Slim, MD    PCP:  No, Pcp        Subjective:  Patient continues to feel better overall, no shortness of breath at rest still has a cough, no bleeding, no nausea or vomiting.    HPI from Initial Inpatient Consultation (10/15/22):      [name] is a pleasant 82 year old lady, with a past medical history significant for uterine cancer, status post TAH, type II DM, hyperlipidemia, and hypertension, who was diagnosed with pancreatic cancer in June 2023, the patient received her care initially at [facility] health, CT scan of the abdomen and the pelvis from 03/22/2022 had revealed an ill-defined 3.5 x 2.5 cm hypodense mass and the uncinate process of the pancreas, hypodense focus in the inferior right lobe of the liver, she had an MRI done on 03/23/2022, revealing a hypoenhancing mass in the uncinate process/inferior head of the pancreas abutting the SMV with distortion of the SMV, less than 180 degree involvement by MR, pancreatic duct narrowing/obstruction, single hepatic lesion in the right hepatic lobe, small lymph node adjacent to the masslike area in the head of the pancreas, she had on 03/26/2022 and a US done, she was found to have a mass in the pancreatic head and uncinate process, FNA was consistent with adenocarcinoma.  The patient was started on systemic palliative therapy with gemcitabine and Abraxane on 04/27/2022, the patient had 3 staging CTs done on 07/13/2022, revealing decreased size of the pancreatic head mass, several new hypodense lesions in the liver, according to the patient, after review of her scans it was not felt that she had disease progression, gemcitabine and Abraxane were continued.  The patient had relocated to [facility] to be with her family, she  had lost about 75 pounds, she has nausea, no abdominal pain at this time.  The patient had bili obstruction and an ERCP done on 05/07/2022, with placement of covered metal stent, was found to have ulcerative tumor invading the duodenum,  The patient had the biliary stent exchange on 09/28/2022 by Dr. Luz Brazen.      Restating CT scans of the chest, abdomen, and pelvis, were done on 09/23/2022, which I had reviewed, she has a small 2 to 3 mm noncalcified nodules in the right upper and right mid lung, nonspecific, ill-defined fullness of the pancreatic head around 2 cm, hypoenhancing focus concerning for underlying malignancy, surrounding adenopathy without metastatic disease otherwise noted.  On 10/01/2022, chemotherapy was resumed, she received gemcitabine and Abraxane lastly on 10/08/2022.     The patient had presented to the ED on 10/12/2022 with fatigue, diarrhea and shortness of breath, she was found to have pancytopenia, white count was 1.2, hemoglobin 6.9, platelets 71K, the patient was positive for COVID, the patient left AMA.  She returned to the ED on 10/14/2022.  She is feeling better today.    Review of Systems;  Negative except as noted above.    Past Medical History:      Diagnosis Date    Diabetes mellitus ([place])     Endometrial cancer ([place])     Hyperlipidemia     Hypertension     Pancreatic cancer [facility])      Patient Active Problem List   Diagnosis  Malignant neoplasm of pancreas (HCC)    Biliary stricture    Neutropenia (HCC)    Dyspnea on exertion    Hypokalemia    COVID-19 virus infection    Type 2 diabetes mellitus with hyperglycemia, without long-term current use of insulin [facility])        Past Surgical History:      Procedure Laterality Date    CHOLECYSTECTOMY      COLONOSCOPY      ERCP N/A 09/28/2022    ERCP STENT REMOVAL/EXCHANGE performed by Georgeann Oppenheim, MD at [place]    ERCP N/A 09/28/2022    ERCP STONE REMOVAL performed by Georgeann Oppenheim, MD at [place], TOTAL ABDOMINAL  (CERVIX REMOVED)      SPINE SURGERY      Plate placed in back    UPPER GASTROINTESTINAL ENDOSCOPY         Family History:  Family History   Problem Relation Age of Onset    Breast Cancer Sister 7    Cancer Sister 83        throat    Cancer Maternal Grandmother 73        unknown type       Medications:  Reviewed and reconciled.    Social History:  Social History     Socioeconomic History    Marital status: Unknown     Spouse name: Not on file    Number of children: Not on file    Years of education: Not on file    Highest education level: Not on file   Occupational History    Not on file   Tobacco Use    Smoking status: Never    Smokeless tobacco: Never   Vaping Use    Vaping Use: Never used   Substance and Sexual Activity    Alcohol use: Yes     Comment: occassionaly    Drug use: Never    Sexual activity: Not on file   Other Topics Concern    Not on file   Social History Narrative    Not on file     Social Determinants of Health     Financial Resource Strain: Not on file   Food Insecurity: No Food Insecurity (10/15/2022)    Hunger Vital Sign     Worried About Running Out of Food in the Last Year: Never true     Ran Out of Food in the Last Year: Never true   Transportation Needs: No Transportation Needs (10/15/2022)    PRAPARE - Armed forces logistics/support/administrative officer (Medical): No     Lack of Transportation (Non-Medical): No   Physical Activity: Not on file   Stress: Not on file   Social Connections: Not on file   Intimate Partner Violence: Not on file   Housing Stability: Low Risk  (10/15/2022)    Housing Stability Vital Sign     Unable to Pay for Housing in the Last Year: No     Number of Places Lived in the Last Year: 1     Unstable Housing in the Last Year: No       Allergies:  Allergies   Allergen Reactions    Molds & Smuts     Nicotine     Shellfish Allergy      Throat seemed to close       Physical Exam:  BP (!) 163/89   Pulse 70   Temp 97.8 F (  36.6 C) (Axillary)   Resp 18   Ht 1.676 m ('5\' 6"'$ )   Wt 85.7  kg (189 lb)   SpO2 97%   BMI 30.51 kg/m   GENERAL: Alert, oriented x 3, not in acute distress, looking tired.  HEENT: PERRLA; EOMI. Oropharynx clear.   NECK: Supple. Normal range of motion.   LUNGS: Clear sounds, without significant wheezes of rhonchi.   CARDIOVASCULAR: Regular rate. No murmurs, rubs or gallops.   ABDOMEN: Soft. Non-tender, non-distended.  No ascites.  EXTREMITIES: Without clubbing, cyanosis, or edema.   NEUROLOGIC: No focal deficits.   ECOG PS 2    Impression/Plan:     [name] is a pleasant 82 year old lady, with a past medical history significant for uterine cancer, status post TAH, type II DM, hyperlipidemia, and hypertension, who was diagnosed with pancreatic cancer in June 2023, the patient received her care initially at [facility] health, CT scan of the abdomen and the pelvis from 03/22/2022 had revealed an ill-defined 3.5 x 2.5 cm hypodense mass and the uncinate process of the pancreas, hypodense focus in the inferior right lobe of the liver, she had an MRI done on 03/23/2022, revealing a hypoenhancing mass in the uncinate process/inferior head of the pancreas abutting the SMV with distortion of the SMV, less than 180 degree involvement by MR, pancreatic duct narrowing/obstruction, single hepatic lesion in the right hepatic lobe, small lymph node adjacent to the masslike area in the head of the pancreas, she had on 03/26/2022 and a US done, she was found to have a mass in the pancreatic head and uncinate process, FNA was consistent with adenocarcinoma.  The patient was started on systemic palliative therapy with gemcitabine and Abraxane on 04/27/2022, the patient had 3 staging CTs done on 07/13/2022, revealing decreased size of the pancreatic head mass, several new hypodense lesions in the liver, according to the patient, after review of her scans it was not felt that she had disease progression, gemcitabine and Abraxane were continued.  The patient had relocated to [facility] to be with her  family, she had lost about 75 pounds, she has nausea, no abdominal pain at this time.  The patient had bili obstruction and an ERCP done on 05/07/2022, with placement of covered metal stent, was found to have ulcerative tumor invading the duodenum,  The patient had the biliary stent exchange on 09/28/2022 by Dr. Luz Brazen.      Restating CT scans of the chest, abdomen, and pelvis, were done on 09/23/2022, which I had reviewed, she has a small 2 to 3 mm noncalcified nodules in the right upper and right mid lung, nonspecific, ill-defined fullness of the pancreatic head around 2 cm, hypoenhancing focus concerning for underlying malignancy, surrounding adenopathy without metastatic disease otherwise noted.  On 10/01/2022, chemotherapy was resumed, she received gemcitabine and Abraxane lastly on 10/08/2022.     The patient had presented to the ED on 10/12/2022 with fatigue, diarrhea and shortness of breath, she was found to have pancytopenia, white count was 1.2, hemoglobin 6.9, platelets 71K, the patient was positive for COVID, the patient left AMA.  Chest x-ray is without acute abnormalities.  Labs reviewed, white count is 2.4, ANC 1730, hemoglobin 9g/DL, hematocrit 27.4, platelet count 69K.      -Pancytopenia secondary to chemotherapy and COVID, improving, labs reviewed, white count had normalized, 5.1 today, hemoglobin 8.7G/DL, platelet count 188K, transfuse with packed RBCs to keep her hemoglobin greater than 7G/DL, transfuse with platelets if bleeding or platelet count is 10K or  less, or if febrile with platelet count 15 K or less.  -Continue to monitor her counts.  -Chemotherapy will be on hold for now.  -Scheduled for outpatient follow on 10/22/22, will likely schedule for next week  -COVID-19+ , the patient is afebrile and on room air.  Clinically doing better.  -PT/OT.    Thank you for allowing [place] to participate in the care of Mrs. Palmatier.    Alfredia Client, MD  Medical Oncology  [place]  10/18/22 5:40  PM

## 2022-10-18 NOTE — Progress Notes (Signed)
[facility]  Internal Medicine Residency Program  Progress Note - House Team       Patient:  Gabriella Blake 82 y.o. female   MRN: [id number]       Date of Service: 10/18/2022  Admission date: 10/14/2022 12:38 AM ; Hospital day: 4   CC: Shortness of Breath (Increased SOB, fatigue, coughing, diarrhea since being seen here yesterday for the same thing. Patient is COVID+)     Overnight events:   -BG 209->295, premeal added (possible change to MDSS tomo)>333, BG remains uncontrolled, patient received nightly Lantus and premeal, assess morning BG    Subjective     Pt was seen and examined at bedside this morning, not in acute distress. She denied headache or dizziness, CP or SOB, some weakness, denied nausea or vomiting.     Objective       Physical Exam  Vitals: BP (!) 143/81   Pulse 74   Temp 97.6 F (36.4 C) (Temporal)   Resp 19   Ht 1.676 m ('5\' 6"'$ )   Wt 85.7 kg (189 lb)   SpO2 94%   BMI 30.51 kg/m     I & O - 24hr: No intake/output data recorded.   General Appearance: not in acute distress  HEENT: Normocephalic, atraumatic  Neck: no carotid bruit, no lymphadenopathy  Lung: clear to ausculation bilaterally   Heart: regular rate and rhythm, S1, S2 normal, no murmur  Abdomen: soft, bowel sounds normal; no masses,  no organomegaly  Extremities: extremities normal, atraumatic, no cyanosis or edema  Musculokeletal: No joint swelling, no muscle tenderness.   Neurologic: Mental status: alert and oriend x3, thoughs appropriate, mild difficult speech at baseline    Diet:   ADULT DIET; Regular; 3 carb choices (45 gm/meal)  ADULT ORAL NUTRITION SUPPLEMENT; Breakfast, Lunch, Dinner; Diabetic Oral Supplement  I & O - 24hr:  No intake or output data in the 24 hours ending 10/18/22 1432    Net IO Since Admission: 720 mL [10/18/22 1432]      Pertinent Labs & Imaging Studies     Labs  Recent Labs     10/16/22  0522 10/17/22  0530 10/18/22  0812   WBC 3.4* 4.3* 5.1   RBC 2.86* 2.89* 3.20*   HGB 7.8* 7.9* 8.7*    HCT 24.3* 24.8* 27.4*   MCV 85.0 85.8 85.6   MCH 27.3 27.3 27.2   MCHC 32.1 31.9* 31.8*   RDW 15.3* 15.5* 15.6*   PLT 75* 114* 188   MPV 10.4 10.6 10.1     Recent Labs     10/15/22  1729 10/16/22  0522 10/16/22  1026 10/16/22  1611 10/16/22  1936 10/17/22  0530 10/18/22  0526   NA 133 135 135 135 134 136 140   K 3.4* 3.2* 3.3* 3.9 3.8 3.6 3.5   CL 96* 96* 95* 98 96* 98 101   MG 1.7 1.6  --  2.1  --  1.9 1.9   PHOS  --  1.6*  --  2.5  --  1.9* 3.0   CO2 29 29 30* '29 29 25 26   '$ BUN 2* 2* 2* 3* 3* 3* 5*   CREATININE 0.5 0.5 0.5 0.5 0.5 0.5 0.5   ANIONGAP '8 10 10 8 9 13 13   '$ GLUCOSE 209* 168* 221* 275* 258* 186* 82   CALCIUM 8.6 8.4* 8.3* 8.5* 8.5* 8.5* 8.4*   PROT  --   --   --   --   --   --  7.0   LABALBU  --   --   --   --   --   --  2.5*   BILITOT  --   --   --   --   --   --  1.2   ALKPHOS  --   --   --   --   --   --  460*   AST  --   --   --   --   --   --  143*   ALT  --   --   --   --   --   --  97*     No results for input(s): "LACTA" in the last 72 hours.    No results for input(s): "TROPHS", "PROBNP" in the last 72 hours.    No results for input(s): "PROTIME", "INR" in the last 72 hours.    No results for input(s): "CKTOTAL", "CKMB", "CKMBINDEX", "TROPONINI" in the last 72 hours.  No results for input(s): "BNP" in the last 72 hours.  No results for input(s): "CHOL", "HDL", "TRIG" in the last 72 hours.    Invalid input(s): "CHOLHDLR", "LDLCALCU"    Hemoglobin A1C   Date Value Ref Range Status   10/13/2022 9.9 (H) 4.0 - 5.6 % Final      No results found for: "TSH", "TSHREFLEX", "TSHFT4", "TSHELE", "TSH3GEN", "TSHHS"     No results for input(s): "PH", "PCO2", "PO2", "HCO3", "BE", "O2SAT" in the last 72 hours.    Invalid input(s): "PFRATIO"     Imaging Studies:    No orders to display          Medications     Continuous Infusions:   dextrose      sodium chloride       Scheduled Meds:   lidocaine  1 patch TransDERmal Daily    insulin glargine  8 Units SubCUTAneous Nightly    insulin lispro  0-4 Units  SubCUTAneous TID WC    insulin lispro  0-4 Units SubCUTAneous Nightly    amLODIPine  5 mg Oral Daily    insulin lispro  4 Units SubCUTAneous TID WC    potassium chloride  20 mEq Oral Once    carvedilol  25 mg Oral BID    calcium elemental  500 mg Oral Daily    vitamin B and C  1 tablet Oral Daily    Vitamin D  2,000 Units Oral Daily    lisinopril  20 mg Oral Daily    sodium chloride flush  5-40 mL IntraVENous 2 times per day    enoxaparin  40 mg SubCUTAneous Daily     PRN Meds: glucose, dextrose bolus **OR** dextrose bolus, glucagon (rDNA), dextrose, prochlorperazine, sodium chloride flush, sodium chloride, potassium chloride **OR** potassium alternative oral replacement **OR** potassium chloride, magnesium sulfate, ondansetron **OR** ondansetron, acetaminophen **OR** acetaminophen, hydrALAZINE, labetalol    Resident's Assessment and Plan     Assessment and Plan:    COVID-19 infection in the setting of significant immunocompromise from chemotherapy  Pt was afebrile, SpO2 99% on RA  Received one dose of Zosyn and Vanc in ED  Blood Cx no growth   Plan:  MRSA pending  Pt saturate well on RA, not required oxygen    Poor nutrition  Prealbumin 4.0  Pt needs assistant with eating  On nutrition supplement drink  Consulted dietitian     Transaminitis  Alk Phos 196 > 468>460  ALT 66 > 86>97  AST 113> 204>143  Follow CMP daily    Thrombcytopenia 2/2 chemotherapy  Plt 99 > 71 > 114>188  Follow CBC    Neutropenia 2/2 chemotherapy    Hypertension  On Coreg '25mg'$  BID  Lisinopril '20mg'$  daily  Lasix '20mg'$  three times a week at home - on hold d/t concern of dehydration    On amlodipine, BP 140s/80s    Hypokalemia likely 2/2 decreased oral intake vs GI loss -resolved   Hyponatremia -resolved   FeNa 0.5% Pre-renal  Pt had decreased urine output on admission  Follow BMP    Hx of pancreatic adenocarcinoma  Dx on 03/2022  Last Chemo was on 10/08/22  On gemcitabine, abraxane  On Creon TID  Hemonc consult: no indication for G-SCF, follow CBC  and hold chemotherapy for now    Hx of uterine cancer  S/p TAH many years ago    Hx of DM2  On Glipizide '10mg'$  BID at home  A1c 9.9, not well controlled  On Lantus 8U and LDSS ACHS  Diabetes education  Monitor BG    Hx of HLD      Discharge planning: Discharge to SAR    Tobacco abuse disorder per chart:  reports that she has never smoked. She has never used smokeless tobacco.  Alcohol abuse disorder per chart:  reports current alcohol use.  DVT prophylaxis: Lovenox  GI prophylaxis: not indicated at this time  Diet:   ADULT DIET; Regular; 3 carb choices (45 gm/meal)  ADULT ORAL NUTRITION SUPPLEMENT; Breakfast, Lunch, Dinner; Diabetic Oral Supplement   Bowel regimen: Glycolax  Pain management: as needed  Code status: Full Code   Disposition: Continue Current Care  Family: updated as available    Lauralee Evener, MD, PGY-1   Attending physician: Dr. Derrek Monaco

## 2022-10-18 NOTE — Progress Notes (Signed)
Reason for consult:  Uncontrolled Diabetes, A1C 9.9%    A1C: 9.9 A1C             Patient states the following concerns/barriers to diabetes self-management:     '[x]'$  None       '[]'$  Medication cost   '[]'$  Food cost/availability        '[]'$  Reading  '[]'$  Hearing   '[]'$  Vision                '[]'$  Work    '[]'$  Transportation  '[]'$  No insurance  '[]'$  Physical limitations    '[]'$  Other:        Patient states the following about their diabetes/health:   '[x]'$    Using GCM - Libre 2 at home to monitor   '[x]'$    Diabetes education in the past- managing to the past 40 years- from [place]   '[x]'$    Take diabetes medications as ordered- states husband, daughter and niece know how to inject insulin- Patient states that she know how to self inject if she needs to   Drinks water, tea with Stevia for beverages   Going to [facility] for rehab and to join husband          Diabetes survival packet provided to:   '[x]'$  Patient     '[]'$  Other:    Information given:   Definition of diabetes   Target glucose ranges/A1C   Self-monitoring of blood glucose   Prevention/symptoms/treatment of hypo-/hyperglycemia   Medication adherence             The plate method/meal planning guidelines             The benefits of exercise and recommendations             Reducing the risk of chronic complications             Post-education Assessment  '[]'$   Attentive to teaching  '[x]'$   Answered questions appropriately when asked   '[x]'$   Seems able to apply concepts to daily lifestyle  '[x]'$   Seems motivated to do well  '[x]'$   Verbalized an understanding of plate method  '[x]'$   Verbalized an understanding of prescribed antidiabetes medications   '[x]'$   Verbalized an understanding of target glucose ranges/A1C level  '[x]'$   Expresses an intent to comply with treatment plan   '[]'$   Showed very little interest in complying with treatment plan   '[]'$   Seems to have trouble applying concepts to daily lifestyle             Recommendations:   '[x]'$  Carbohydrate-controlled diet    '[]'$  Script for glucometer and  supplies (per preference of patient's insurance)  '[]'$  Script for insulin pens and pen needles (if insulin is ordered at discharge for home use)   '[]'$  Consult to social work: patient has no insurance or has financial hardship  '[]'$  Inpatient consult to endocrinologist   '[]'$  Follow up with endocrinologist as an outpatient   '[]'$  Home healthcare nursing to increase compliance to treatment plan   '[]'$  Script for outpatient diabetes education classes (from doctor)    Thank you for this consult.

## 2022-10-18 NOTE — Care Coordination-Inpatient (Signed)
CM Update: Spoke to pt at bedside, she is still agreeable for SAR at discharge. Followed up with her son Gabriella Blake. He tells me mom and her husband were just approved for an Independent Living apartment at [facility]. Husband is moving in tomorrow. She would like to get short term rehab at [facility] and then transfer to her apartment. Referral called to [facility] to review. Oncology on hold as pt has COVID. She has a f/u scheduled Fri 1/12 with Dr Artist Pais. Liver enzymes elevated on todays labs. Hgb 7.9. CM will continue to follow (TF)      Evalee Jefferson, [facility]  Case Management  [phone]

## 2022-10-19 LAB — COMPREHENSIVE METABOLIC PANEL W/ REFLEX TO MG FOR LOW K
ALT: 76 U/L — ABNORMAL HIGH (ref 0–32)
AST: 107 U/L — ABNORMAL HIGH (ref 0–31)
Albumin: 2.3 g/dL — ABNORMAL LOW (ref 3.5–5.2)
Alkaline Phosphatase: 439 U/L — ABNORMAL HIGH (ref 35–104)
Anion Gap: 9 mmol/L (ref 7–16)
BUN: 5 mg/dL — ABNORMAL LOW (ref 6–23)
CO2: 27 mmol/L (ref 22–29)
Calcium: 8.3 mg/dL — ABNORMAL LOW (ref 8.6–10.2)
Chloride: 98 mmol/L (ref 98–107)
Creatinine: 0.5 mg/dL (ref 0.50–1.00)
Est, Glom Filt Rate: >60 mL/min/{1.73_m2} (ref >60)
Glucose: 197 mg/dL — ABNORMAL HIGH (ref 74–99)
Potassium: 3.2 mmol/L — ABNORMAL LOW (ref 3.5–5.0)
Sodium: 134 mmol/L (ref 132–146)
Total Bilirubin: 1 mg/dL (ref 0.0–1.2)
Total Protein: 6.2 g/dL — ABNORMAL LOW (ref 6.4–8.3)

## 2022-10-19 LAB — BASIC METABOLIC PANEL
Anion Gap: 9 mmol/L (ref 7–16)
BUN: 5 mg/dL — ABNORMAL LOW (ref 6–23)
CO2: 28 mmol/L (ref 22–29)
Calcium: 8.6 mg/dL (ref 8.6–10.2)
Chloride: 97 mmol/L — ABNORMAL LOW (ref 98–107)
Creatinine: 0.5 mg/dL (ref 0.50–1.00)
Est, Glom Filt Rate: >60 mL/min/{1.73_m2} (ref >60)
Glucose: 120 mg/dL — ABNORMAL HIGH (ref 74–99)
Potassium: 3.4 mmol/L — ABNORMAL LOW (ref 3.5–5.0)
Sodium: 134 mmol/L (ref 132–146)

## 2022-10-19 LAB — CULTURE, BLOOD 1: Culture: NO GROWTH

## 2022-10-19 LAB — POCT GLUCOSE
POC Glucose: 182 mg/dL — ABNORMAL HIGH (ref 74–99)
POC Glucose: 128 mg/dL — ABNORMAL HIGH (ref 74–99)

## 2022-10-19 LAB — PHOSPHORUS: Phosphorus: 3.1 mg/dL (ref 2.5–4.5)

## 2022-10-19 LAB — MAGNESIUM: Magnesium: 1.8 mg/dL (ref 1.6–2.6)

## 2022-10-19 MED ORDER — AMLODIPINE BESYLATE 10 MG PO TABS
10 MG | Freq: Every day | ORAL | Status: AC
Start: 2022-10-19 — End: 2022-10-20

## 2022-10-19 MED ORDER — POTASSIUM CHLORIDE CRYS ER 20 MEQ PO TBCR
20 MEQ | Freq: Once | ORAL | Status: AC
Start: 2022-10-19 — End: 2022-10-19
  Administered 2022-10-19: 18:00:00 40 meq via ORAL

## 2022-10-19 MED ORDER — PANTOPRAZOLE SODIUM 40 MG PO TBEC
40 MG | Freq: Once | ORAL | Status: AC
Start: 2022-10-19 — End: 2022-10-18
  Administered 2022-10-19: 05:00:00 40 mg via ORAL

## 2022-10-19 MED ORDER — TRAMADOL HCL 50 MG PO TABS
50 | Freq: Two times a day (BID) | ORAL | Status: DC | PRN
Start: 2022-10-19 — End: 2022-10-20
  Administered 2022-10-19: 05:00:00 50 mg via ORAL

## 2022-10-19 MED FILL — INSULIN LISPRO 100 UNIT/ML IJ SOLN: 100 UNIT/ML | INTRAMUSCULAR | Qty: 4

## 2022-10-19 MED FILL — CARVEDILOL 25 MG PO TABS: 25 MG | ORAL | Qty: 1

## 2022-10-19 MED FILL — OYSTER SHELL CALCIUM 500 MG PO TABS: 500 MG | ORAL | Qty: 1

## 2022-10-19 MED FILL — POTASSIUM CHLORIDE CRYS ER 20 MEQ PO TBCR: 20 MEQ | ORAL | Qty: 2

## 2022-10-19 MED FILL — AMLODIPINE BESYLATE 5 MG PO TABS: 5 MG | ORAL | Qty: 1

## 2022-10-19 MED FILL — ASPERCREME LIDOCAINE 4 % EX PTCH: 4 % | CUTANEOUS | Qty: 1

## 2022-10-19 MED FILL — PANTOPRAZOLE SODIUM 40 MG PO TBEC: 40 MG | ORAL | Qty: 1

## 2022-10-19 MED FILL — PAIN & FEVER 325 MG PO TABS: 325 MG | ORAL | Qty: 2

## 2022-10-19 MED FILL — TRAMADOL HCL 50 MG PO TABS: 50 MG | ORAL | Qty: 1

## 2022-10-19 MED FILL — VITAMIN D3 25 MCG (1000 UT) PO TABS: 25 MCG (1000 UT) | ORAL | Qty: 2

## 2022-10-19 MED FILL — TOTAL B/C PO TABS: ORAL | Qty: 1

## 2022-10-19 MED FILL — LOVENOX 40 MG/0.4ML IJ SOSY: 40 MG/0.4ML | INTRAMUSCULAR | Qty: 0.4

## 2022-10-19 MED FILL — LISINOPRIL 20 MG PO TABS: 20 MG | ORAL | Qty: 1

## 2022-10-19 NOTE — Plan of Care (Signed)
Problem: Safety - Adult  Goal: Free from fall injury  Outcome: Progressing     Problem: Discharge Planning  Goal: Discharge to home or other facility with appropriate resources  Outcome: Progressing  Flowsheets (Taken 10/19/2022 1956)  Discharge to home or other facility with appropriate resources: Identify barriers to discharge with patient and caregiver     Problem: ABCDS Injury Assessment  Goal: Absence of physical injury  Outcome: Progressing     Problem: Chronic Conditions and Co-morbidities  Goal: Patient's chronic conditions and co-morbidity symptoms are monitored and maintained or improved  Outcome: Progressing  Flowsheets (Taken 10/19/2022 1956)  Care Plan - Patient's Chronic Conditions and Co-Morbidity Symptoms are Monitored and Maintained or Improved: Monitor and assess patient's chronic conditions and comorbid symptoms for stability, deterioration, or improvement     Problem: Respiratory - Adult  Goal: Achieves optimal ventilation and oxygenation  Outcome: Progressing     Problem: Musculoskeletal - Adult  Goal: Return mobility to safest level of function  Outcome: Progressing  Flowsheets (Taken 10/19/2022 1956)  Return Mobility to Safest Level of Function: Assess patient stability and activity tolerance for standing, transferring and ambulating with or without assistive devices  Goal: Return ADL status to a safe level of function  Outcome: Progressing  Flowsheets (Taken 10/19/2022 1956)  Return ADL Status to a Safe Level of Function: Administer medication as ordered     Problem: Gastrointestinal - Adult  Goal: Minimal or absence of nausea and vomiting  Outcome: Progressing  Goal: Maintains or returns to baseline bowel function  Outcome: Progressing  Goal: Maintains adequate nutritional intake  Outcome: Progressing     Problem: Infection - Adult  Goal: Absence of infection at discharge  Outcome: Progressing  Flowsheets (Taken 10/19/2022 1956)  Absence of infection at discharge: Assess and monitor for signs and  symptoms of infection     Problem: Skin/Tissue Integrity - Adult  Goal: Skin integrity remains intact  Outcome: Progressing  Flowsheets (Taken 10/19/2022 1956)  Skin Integrity Remains Intact: Assess vascular access sites hourly     Problem: Nutrition Deficit:  Goal: Optimize nutritional status  Outcome: Progressing     Problem: Pain  Goal: Verbalizes/displays adequate comfort level or baseline comfort level  Outcome: Progressing

## 2022-10-19 NOTE — Progress Notes (Signed)
[facility]  [address]  [place] [address]  Dept: [phone]  Loc: [phone]  Attending Progress Note      Reason for Visit:   Pancreatic cancer, pancytopenia, COVID.    Referring Physician:  Cleone Slim, MD    PCP:  No, Pcp      Subjective:  The patient was seen and examined, she is sitting up at the edge of the bed, she is feeling better, no shortness of breath at rest, cough is improving, no nausea or vomiting.  Appetite has improved.    HPI from Initial Inpatient Consultation (10/15/22):      Gabriella Blake is a pleasant 82 year old lady, with a past medical history significant for uterine cancer, status post TAH, type II DM, hyperlipidemia, and hypertension, who was diagnosed with pancreatic cancer in June 2023, the patient received her care initially at [facility] health, CT scan of the abdomen and the pelvis from 03/22/2022 had revealed an ill-defined 3.5 x 2.5 cm hypodense mass and the uncinate process of the pancreas, hypodense focus in the inferior right lobe of the liver, she had an MRI done on 03/23/2022, revealing a hypoenhancing mass in the uncinate process/inferior head of the pancreas abutting the SMV with distortion of the SMV, less than 180 degree involvement by MR, pancreatic duct narrowing/obstruction, single hepatic lesion in the right hepatic lobe, small lymph node adjacent to the masslike area in the head of the pancreas, she had on 03/26/2022 and a US done, she was found to have a mass in the pancreatic head and uncinate process, FNA was consistent with adenocarcinoma.  The patient was started on systemic palliative therapy with gemcitabine and Abraxane on 04/27/2022, the patient had 3 staging CTs done on 07/13/2022, revealing decreased size of the pancreatic head mass, several new hypodense lesions in the liver, according to the patient, after review of her scans it was not felt that she had disease progression, gemcitabine and Abraxane were continued.   The patient had relocated to [facility] to be with her family, she had lost about 75 pounds, she has nausea, no abdominal pain at this time.  The patient had bili obstruction and an ERCP done on 05/07/2022, with placement of covered metal stent, was found to have ulcerative tumor invading the duodenum,  The patient had the biliary stent exchange on 09/28/2022 by Dr. Luz Brazen.      Restating CT scans of the chest, abdomen, and pelvis, were done on 09/23/2022, which I had reviewed, she has a small 2 to 3 mm noncalcified nodules in the right upper and right mid lung, nonspecific, ill-defined fullness of the pancreatic head around 2 cm, hypoenhancing focus concerning for underlying malignancy, surrounding adenopathy without metastatic disease otherwise noted.  On 10/01/2022, chemotherapy was resumed, she received gemcitabine and Abraxane lastly on 10/08/2022.     The patient had presented to the ED on 10/12/2022 with fatigue, diarrhea and shortness of breath, she was found to have pancytopenia, white count was 1.2, hemoglobin 6.9, platelets 71K, the patient was positive for COVID, the patient left AMA.  She returned to the ED on 10/14/2022.  She is feeling better today.    Review of Systems;  Negative except as noted above.    Past Medical History:      Diagnosis Date    Diabetes mellitus ([place])     Endometrial cancer ([place])     Hyperlipidemia     Hypertension     Pancreatic cancer (  [place])      Patient Active Problem List   Diagnosis    Malignant neoplasm of pancreas ([place])    Biliary stricture    Neutropenia (HCC)    Dyspnea on exertion    Hypokalemia    COVID-19 virus infection    Type 2 diabetes mellitus with hyperglycemia, without long-term current use of insulin (HCC)        Past Surgical History:      Procedure Laterality Date    CHOLECYSTECTOMY      COLONOSCOPY      ERCP N/A 09/28/2022    ERCP STENT REMOVAL/EXCHANGE performed by Georgeann Oppenheim, MD at [facility]    ERCP N/A 09/28/2022    ERCP STONE REMOVAL performed by  Georgeann Oppenheim, MD at [place], TOTAL ABDOMINAL (CERVIX REMOVED)      SPINE SURGERY      Plate placed in back    UPPER GASTROINTESTINAL ENDOSCOPY         Family History:  Family History   Problem Relation Age of Onset    Breast Cancer Sister 51    Cancer Sister 31        throat    Cancer Maternal Grandmother 73        unknown type       Medications:  Reviewed and reconciled.    Social History:  Social History     Socioeconomic History    Marital status: Unknown     Spouse name: Not on file    Number of children: Not on file    Years of education: Not on file    Highest education level: Not on file   Occupational History    Not on file   Tobacco Use    Smoking status: Never    Smokeless tobacco: Never   Vaping Use    Vaping Use: Never used   Substance and Sexual Activity    Alcohol use: Yes     Comment: occassionaly    Drug use: Never    Sexual activity: Not on file   Other Topics Concern    Not on file   Social History Narrative    Not on file     Social Determinants of Health     Financial Resource Strain: Not on file   Food Insecurity: No Food Insecurity (10/15/2022)    Hunger Vital Sign     Worried About Running Out of Food in the Last Year: Never true     Ran Out of Food in the Last Year: Never true   Transportation Needs: No Transportation Needs (10/15/2022)    PRAPARE - Armed forces logistics/support/administrative officer (Medical): No     Lack of Transportation (Non-Medical): No   Physical Activity: Not on file   Stress: Not on file   Social Connections: Not on file   Intimate Partner Violence: Not on file   Housing Stability: Low Risk  (10/15/2022)    Housing Stability Vital Sign     Unable to Pay for Housing in the Last Year: No     Number of Places Lived in the Last Year: 1     Unstable Housing in the Last Year: No       Allergies:  Allergies   Allergen Reactions    Molds & Smuts     Nicotine     Shellfish Allergy      Throat seemed to close  Physical Exam:  BP (!) 134/57   Pulse 76   Temp 98.9  F (37.2 C) (Oral)   Resp 20   Ht 1.676 m ('5\' 6"'$ )   Wt 85.7 kg (189 lb)   SpO2 98%   BMI 30.51 kg/m   GENERAL: Alert, oriented x 3, not in acute distress, looking tired.  HEENT: PERRLA; EOMI. Oropharynx clear.   NECK: Supple. Normal range of motion.   LUNGS: Clear sounds, without significant wheezes of rhonchi.   CARDIOVASCULAR: Regular rate. No murmurs, rubs or gallops.   ABDOMEN: Soft. Non-tender, non-distended.  No ascites.  EXTREMITIES: Without clubbing, cyanosis, or edema.   NEUROLOGIC: No focal deficits.   ECOG PS 2    Impression/Plan:     Mrs. Temples is a pleasant 82 year old lady, with a past medical history significant for uterine cancer, status post TAH, type II DM, hyperlipidemia, and hypertension, who was diagnosed with pancreatic cancer in June 2023, the patient received her care initially at [facility] health, CT scan of the abdomen and the pelvis from 03/22/2022 had revealed an ill-defined 3.5 x 2.5 cm hypodense mass and the uncinate process of the pancreas, hypodense focus in the inferior right lobe of the liver, she had an MRI done on 03/23/2022, revealing a hypoenhancing mass in the uncinate process/inferior head of the pancreas abutting the SMV with distortion of the SMV, less than 180 degree involvement by MR, pancreatic duct narrowing/obstruction, single hepatic lesion in the right hepatic lobe, small lymph node adjacent to the masslike area in the head of the pancreas, she had on 03/26/2022 and a US done, she was found to have a mass in the pancreatic head and uncinate process, FNA was consistent with adenocarcinoma.  The patient was started on systemic palliative therapy with gemcitabine and Abraxane on 04/27/2022, the patient had 3 staging CTs done on 07/13/2022, revealing decreased size of the pancreatic head mass, several new hypodense lesions in the liver, according to the patient, after review of her scans it was not felt that she had disease progression, gemcitabine and Abraxane were  continued.  The patient had relocated to [facility] to be with her family, she had lost about 75 pounds, she has nausea, no abdominal pain at this time.  The patient had bili obstruction and an ERCP done on 05/07/2022, with placement of covered metal stent, was found to have ulcerative tumor invading the duodenum,  The patient had the biliary stent exchange on 09/28/2022 by Dr. Luz Brazen.      Restating CT scans of the chest, abdomen, and pelvis, were done on 09/23/2022, which I had reviewed, she has a small 2 to 3 mm noncalcified nodules in the right upper and right mid lung, nonspecific, ill-defined fullness of the pancreatic head around 2 cm, hypoenhancing focus concerning for underlying malignancy, surrounding adenopathy without metastatic disease otherwise noted.  On 10/01/2022, chemotherapy was resumed, she received gemcitabine and Abraxane lastly on 10/08/2022.     The patient had presented to the ED on 10/12/2022 with fatigue, diarrhea and shortness of breath, she was found to have pancytopenia, white count was 1.2, hemoglobin 6.9, platelets 71K, the patient was positive for COVID, the patient left AMA.  Chest x-ray is without acute abnormalities.  Labs reviewed, white count is 2.4, ANC 1730, hemoglobin 9g/DL, hematocrit 27.4, platelet count 69K.    -Pancytopenia secondary to chemotherapy and COVID, has improved labs reviewed, on 10/18/2022, white count had normalized, 5.1 today, hemoglobin 8.7G/DL, platelet count 188K, transfuse with packed RBCs to keep  her hemoglobin greater than 7G/DL, transfuse with platelets if bleeding or platelet count is 10K or less, or if febrile with platelet count 15 K or less.   -Continue to monitor her counts, CBCD in a.m.  -Chemotherapy will be on hold for now.  -Scheduled for outpatient follow on 10/22/22, will reschedule for next week  -COVID-19+ , the patient is afebrile and on room air.  Clinically doing better.  -PT/OT.  -DC planning.    Thank you for allowing [place] to participate in  the care of Mrs. Schenk.    Alfredia Client, MD  Medical Oncology  [place]  10/19/22 6:17 PM

## 2022-10-19 NOTE — Progress Notes (Signed)
[facility]  Internal Medicine Residency Program  Progress Note - House Team       Patient:  Gabriella Blake 82 y.o. female   MRN: [id number]       Date of Service: 10/19/2022  Admission date: 10/14/2022 12:38 AM ; Hospital day: 5   CC: Shortness of Breath (Increased SOB, fatigue, coughing, diarrhea since being seen here yesterday for the same thing. Patient is COVID+)     Overnight events:   -no acute events    Subjective     Pt was seen and examined at bedside this morning, not in acute distress. She denied headache or dizziness, CP or SOB, some weakness, denied nausea or vomiting.     Objective       Physical Exam  Vitals: BP (!) 158/89   Pulse 79   Temp 98.6 F (37 C) (Oral)   Resp 18   Ht 1.676 m ('5\' 6"'$ )   Wt 85.7 kg (189 lb)   SpO2 96%   BMI 30.51 kg/m     I & O - 24hr: No intake/output data recorded.   General Appearance: not in acute distress  HEENT: Normocephalic, atraumatic  Neck: no carotid bruit, no lymphadenopathy  Lung: clear to ausculation bilaterally   Heart: regular rate and rhythm, S1, S2 normal, no murmur  Abdomen: soft, bowel sounds normal; no masses,  no organomegaly  Extremities: extremities normal, atraumatic, no cyanosis or edema  Musculokeletal: No joint swelling, no muscle tenderness.   Neurologic: Mental status: alert and oriend x3, thoughs appropriate, mild difficult speech at baseline    Diet:   ADULT DIET; Regular; 3 carb choices (45 gm/meal)  ADULT ORAL NUTRITION SUPPLEMENT; Breakfast, Lunch, Dinner; Diabetic Oral Supplement  I & O - 24hr:  No intake or output data in the 24 hours ending 10/19/22 1309    Net IO Since Admission: 960 mL [10/19/22 1309]      Pertinent Labs & Imaging Studies     Labs  Recent Labs     10/17/22  0530 10/18/22  0812   WBC 4.3* 5.1   RBC 2.89* 3.20*   HGB 7.9* 8.7*   HCT 24.8* 27.4*   MCV 85.8 85.6   MCH 27.3 27.2   MCHC 31.9* 31.8*   RDW 15.5* 15.6*   PLT 114* 188   MPV 10.6 10.1     Recent Labs     10/16/22  1611 10/16/22  1936  10/17/22  0530 10/18/22  0526 10/19/22  0407   NA 135 134 136 140 134   K 3.9 3.8 3.6 3.5 3.2*   CL 98 96* 98 101 98   MG 2.1  --  1.9 1.9 1.8   PHOS 2.5  --  1.9* 3.0 3.1   CO2 '29 29 25 26 27   '$ BUN 3* 3* 3* 5* 5*   CREATININE 0.5 0.5 0.5 0.5 0.5   ANIONGAP '8 9 13 13 9   '$ GLUCOSE 275* 258* 186* 82 197*   CALCIUM 8.5* 8.5* 8.5* 8.4* 8.3*   PROT  --   --   --  7.0 6.2*   LABALBU  --   --   --  2.5* 2.3*   BILITOT  --   --   --  1.2 1.0   ALKPHOS  --   --   --  460* 439*   AST  --   --   --  143* 107*   ALT  --   --   --  97* 76*     No results for input(s): "LACTA" in the last 72 hours.    No results for input(s): "TROPHS", "PROBNP" in the last 72 hours.    No results for input(s): "PROTIME", "INR" in the last 72 hours.    No results for input(s): "CKTOTAL", "CKMB", "CKMBINDEX", "TROPONINI" in the last 72 hours.  No results for input(s): "BNP" in the last 72 hours.  No results for input(s): "CHOL", "HDL", "TRIG" in the last 72 hours.    Invalid input(s): "CHOLHDLR", "LDLCALCU"    Hemoglobin A1C   Date Value Ref Range Status   10/13/2022 9.9 (H) 4.0 - 5.6 % Final      No results found for: "TSH", "TSHREFLEX", "TSHFT4", "TSHELE", "TSH3GEN", "TSHHS"     No results for input(s): "PH", "PCO2", "PO2", "HCO3", "BE", "O2SAT" in the last 72 hours.    Invalid input(s): "PFRATIO"     Imaging Studies:    No orders to display          Medications     Continuous Infusions:   dextrose      sodium chloride       Scheduled Meds:   [START ON 10/20/2022] amLODIPine  10 mg Oral Daily    lidocaine  1 patch TransDERmal Daily    insulin glargine  8 Units SubCUTAneous Nightly    insulin lispro  0-4 Units SubCUTAneous TID WC    insulin lispro  0-4 Units SubCUTAneous Nightly    insulin lispro  4 Units SubCUTAneous TID WC    potassium chloride  20 mEq Oral Once    carvedilol  25 mg Oral BID    calcium elemental  500 mg Oral Daily    vitamin B and C  1 tablet Oral Daily    Vitamin D  2,000 Units Oral Daily    lisinopril  20 mg Oral Daily    sodium  chloride flush  5-40 mL IntraVENous 2 times per day    enoxaparin  40 mg SubCUTAneous Daily     PRN Meds: traMADol, glucose, dextrose bolus **OR** dextrose bolus, glucagon (rDNA), dextrose, prochlorperazine, sodium chloride flush, sodium chloride, potassium chloride **OR** potassium alternative oral replacement **OR** potassium chloride, magnesium sulfate, ondansetron **OR** ondansetron, acetaminophen **OR** acetaminophen, hydrALAZINE, labetalol    Resident's Assessment and Plan     Assessment and Plan:    COVID-19 infection in the setting of significant immunocompromise from chemotherapy  Pt was afebrile, SpO2 99% on RA  Received one dose of Zosyn and Vanc in ED  Blood Cx no growth   Plan:  Pt saturate well on RA, not required oxygen    Poor nutrition  Prealbumin 4.0  Pt needs assistant with eating  On nutrition supplement drink  Consulted dietitian     Transaminitis  Alk Phos 196 > 468>460>439  ALT 66 > 86>97>76  AST 113> 204>143>107  Follow CMP daily    Thrombcytopenia 2/2 chemotherapy  Plt 99 > 71 > 114>188  Follow CBC    Neutropenia 2/2 chemotherapy    Hypertension  On Coreg '25mg'$  BID  Lisinopril '20mg'$  daily  Lasix '20mg'$  three times a week at home - on hold d/t concern of dehydration    On amlodipine, BP 140s/80s    Hypokalemia likely 2/2 decreased oral intake vs GI loss -resolved   Hyponatremia -resolved   FeNa 0.5% Pre-renal  Pt had decreased urine output on admission  Follow BMP    Hx of pancreatic adenocarcinoma  Dx on 03/2022  Last  Chemo was on 10/08/22  On gemcitabine, abraxane  On Creon TID  Hemonc consult: no indication for G-SCF, follow CBC and hold chemotherapy for now    Hx of uterine cancer  S/p TAH many years ago    Hx of DM2  On Glipizide '10mg'$  BID at home  A1c 9.9, not well controlled  On Lantus 8U and LDSS ACHS  Diabetes education  Monitor BG    Hx of HLD      Discharge planning: Discharge to [facility], waiting for Precert    Tobacco abuse disorder per chart:  reports that she has never smoked. She  has never used smokeless tobacco.  Alcohol abuse disorder per chart:  reports current alcohol use.  DVT prophylaxis: Lovenox  GI prophylaxis: not indicated at this time  Diet:   ADULT DIET; Regular; 3 carb choices (45 gm/meal)  ADULT ORAL NUTRITION SUPPLEMENT; Breakfast, Lunch, Dinner; Diabetic Oral Supplement   Bowel regimen: Glycolax  Pain management: as needed  Code status: Full Code   Disposition: Continue Current Care  Family: updated as available    Lauralee Evener, MD, PGY-1   Attending physician: Dr. Derrek Monaco

## 2022-10-19 NOTE — Care Coordination-Inpatient (Signed)
CM Update: Precert pending for [facility], initiated yesterday. Spoke to [place], she is aware pt is ready for d/c. Can not accept pending precert with this insurance. Hopeful she will hear today. COC/destination updated. PASRR and ambulette form on soft chart (TF)      Evalee Jefferson, Toney Reil  Case Management  [phone]

## 2022-10-19 NOTE — Progress Notes (Signed)
Progress  Note  Chief Complaint   Patient presents with    Shortness of Breath     Increased SOB, fatigue, coughing, diarrhea since being seen here yesterday for the same thing. Patient is COVID+     Past Medical History:   Diagnosis Date    Diabetes mellitus ([place])     Endometrial cancer ([place])     Hyperlipidemia     Hypertension     Pancreatic cancer ([place])        Current Facility-Administered Medications   Medication Dose Route Frequency Provider Last Rate Last Admin    potassium chloride (KLOR-CON M) extended release tablet 40 mEq  40 mEq Oral Once Lauralee Evener, MD        Derrill Memo ON 10/20/2022] amLODIPine (NORVASC) tablet 10 mg  10 mg Oral Daily Francee Gentile, MD        lidocaine 4 % external patch 1 patch  1 patch TransDERmal Daily Francee Gentile, MD   1 patch at 10/19/22 1039    insulin glargine (LANTUS) injection vial 8 Units  8 Units SubCUTAneous Nightly Lauralee Evener, MD        traMADol Veatrice Bourbon) tablet 50 mg  50 mg Oral Q12H PRN Neupane, Rasik, MD   50 mg at 10/18/22 2339    insulin lispro (HUMALOG) injection vial 0-4 Units  0-4 Units SubCUTAneous TID WC Lauralee Evener, MD   2 Units at 10/17/22 1757    insulin lispro (HUMALOG) injection vial 0-4 Units  0-4 Units SubCUTAneous Nightly Lauralee Evener, MD   4 Units at 10/17/22 2024    insulin lispro (HUMALOG) injection vial 4 Units  4 Units SubCUTAneous TID WC Francee Gentile, MD   4 Units at 10/19/22 1040    glucose chewable tablet 16 g  4 tablet Oral PRN Leeanne Rio, MD        dextrose bolus 10% 125 mL  125 mL IntraVENous PRN Leeanne Rio, MD        Or    dextrose bolus 10% 250 mL  250 mL IntraVENous PRN Leeanne Rio, MD        glucagon injection 1 mg  1 mg SubCUTAneous PRN Leeanne Rio, MD        dextrose 10 % infusion   IntraVENous Continuous PRN Leeanne Rio, MD        potassium chloride (KLOR-CON M) extended release tablet 20 mEq  20 mEq Oral Once Cleone Slim, MD        carvedilol (COREG) tablet 25 mg   25 mg Oral BID Cleone Slim, MD   25 mg at 10/19/22 1040    calcium elemental (OSCAL) tablet 500 mg  500 mg Oral Daily Cleone Slim, MD   500 mg at 10/19/22 1040    vitamin B and C (TOTAL B-C) 1 tablet  1 tablet Oral Daily Cleone Slim, MD   1 tablet at 10/19/22 1040    Vitamin D (CHOLECALCIFEROL) tablet 2,000 Units  2,000 Units Oral Daily Cleone Slim, MD   2,000 Units at 10/19/22 1040    lisinopril (PRINIVIL;ZESTRIL) tablet 20 mg  20 mg Oral Daily Cleone Slim, MD   20 mg at 10/19/22 1040    prochlorperazine (COMPAZINE) tablet 10 mg  10 mg Oral Q6H PRN Ozogbo, Nadeen Landau, MD        sodium chloride flush 0.9 % injection 5-40 mL  5-40 mL IntraVENous 2 times per day Ozogbo, Nadeen Landau, MD  10 mL at 10/18/22 1950    sodium chloride flush 0.9 % injection 5-40 mL  5-40 mL IntraVENous PRN Ozogbo, Nadeen Landau, MD        0.9 % sodium chloride infusion   IntraVENous PRN Ozogbo, Nadeen Landau, MD        potassium chloride (KLOR-CON M) extended release tablet 40 mEq  40 mEq Oral PRN Ozogbo, Nadeen Landau, MD        Or    potassium bicarb-citric acid (EFFER-K) effervescent tablet 40 mEq  40 mEq Oral PRN Ozogbo, Nadeen Landau, MD        Or    potassium chloride 10 mEq/100 mL IVPB (Peripheral Line)  10 mEq IntraVENous PRN Ozogbo, Nadeen Landau, MD        magnesium sulfate 2000 mg in 50 mL IVPB premix  2,000 mg IntraVENous PRN Ozogbo, Nadeen Landau, MD        enoxaparin (LOVENOX) injection 40 mg  40 mg SubCUTAneous Daily Ozogbo, Nadeen Landau, MD   40 mg at 10/19/22 1040    ondansetron (ZOFRAN-ODT) disintegrating tablet 4 mg  4 mg Oral Q8H PRN Cleone Slim, MD        Or    ondansetron [facility]) injection 4 mg  4 mg IntraVENous Q6H PRN Ozogbo, Nadeen Landau, MD        acetaminophen (TYLENOL) tablet 650 mg  650 mg Oral Q6H PRN Cleone Slim, MD   650 mg at 10/18/22 1951    Or    acetaminophen (TYLENOL) suppository 650 mg  650 mg  Rectal Q6H PRN Cleone Slim, MD        hydrALAZINE (APRESOLINE) injection 10 mg  10 mg IntraVENous Q6H PRN Juel Burrow, MD   10 mg at 10/18/22 1608    labetalol (NORMODYNE;TRANDATE) injection 10 mg  10 mg IntraVENous Q6H PRN Juel Burrow, MD   10 mg at 10/18/22 0424     Recent Complaints:  Review of Systems  Vitals:    10/19/22 1045   BP: (!) 158/89   Pulse: 79   Resp: 18   Temp: 98.6 F (37 C)   SpO2: 96%     Physical Exam  Pale  Heart rate regular  Lungs clear  Recent Labs     10/17/22  0530 10/18/22  0526 10/19/22  0407   NA 136 140 134   K 3.6 3.5 3.2*   CL 98 101 98   CO2 '25 26 27   '$ BUN 3* 5* 5*   CREATININE 0.5 0.5 0.5   GLUCOSE 186* 82 197*   CALCIUM 8.5* 8.4* 8.3*     Recent Labs     10/17/22  0530 10/18/22  0812   WBC 4.3* 5.1   RBC 2.89* 3.20*   HGB 7.9* 8.7*   HCT 24.8* 27.4*   MCV 85.8 85.6   MCH 27.3 27.2   MCHC 31.9* 31.8*   RDW 15.5* 15.6*   PLT 114* 188   MPV 10.6 10.1           Principal Problem:    Dyspnea on exertion  Active Problems:    COVID-19 virus infection    Type 2 diabetes mellitus with hyperglycemia, without long-term current use of insulin (HCC)  Resolved Problems:    * No resolved hospital problems. *      Plan:  Discharge planning      Case Discussed with: Case management      Electronically signed by  Levander Campion, MD on 10/19/2022 at 1:02 PM

## 2022-10-20 DIAGNOSIS — R0609 Other forms of dyspnea: Secondary | ICD-10-CM

## 2022-10-20 DIAGNOSIS — Z515 Encounter for palliative care: Secondary | ICD-10-CM | POA: Diagnosis not present

## 2022-10-20 DIAGNOSIS — E785 Hyperlipidemia, unspecified: Secondary | ICD-10-CM | POA: Diagnosis not present

## 2022-10-20 DIAGNOSIS — I10 Essential (primary) hypertension: Secondary | ICD-10-CM | POA: Diagnosis not present

## 2022-10-20 DIAGNOSIS — G893 Neoplasm related pain (acute) (chronic): Secondary | ICD-10-CM | POA: Diagnosis not present

## 2022-10-20 DIAGNOSIS — R634 Abnormal weight loss: Secondary | ICD-10-CM | POA: Diagnosis not present

## 2022-10-20 DIAGNOSIS — R630 Anorexia: Secondary | ICD-10-CM | POA: Diagnosis not present

## 2022-10-20 DIAGNOSIS — C259 Malignant neoplasm of pancreas, unspecified: Secondary | ICD-10-CM | POA: Diagnosis not present

## 2022-10-20 DIAGNOSIS — Z8616 Personal history of COVID-19: Secondary | ICD-10-CM | POA: Diagnosis not present

## 2022-10-20 DIAGNOSIS — R41841 Cognitive communication deficit: Secondary | ICD-10-CM | POA: Diagnosis not present

## 2022-10-20 DIAGNOSIS — R110 Nausea: Secondary | ICD-10-CM | POA: Diagnosis not present

## 2022-10-20 DIAGNOSIS — E118 Type 2 diabetes mellitus with unspecified complications: Secondary | ICD-10-CM | POA: Diagnosis not present

## 2022-10-20 DIAGNOSIS — E119 Type 2 diabetes mellitus without complications: Secondary | ICD-10-CM | POA: Diagnosis not present

## 2022-10-20 DIAGNOSIS — U071 COVID-19: Secondary | ICD-10-CM | POA: Diagnosis not present

## 2022-10-20 DIAGNOSIS — R262 Difficulty in walking, not elsewhere classified: Secondary | ICD-10-CM | POA: Diagnosis not present

## 2022-10-20 DIAGNOSIS — F32A Depression, unspecified: Secondary | ICD-10-CM | POA: Diagnosis not present

## 2022-10-20 DIAGNOSIS — Z6828 Body mass index (BMI) 28.0-28.9, adult: Secondary | ICD-10-CM | POA: Diagnosis not present

## 2022-10-20 DIAGNOSIS — E1169 Type 2 diabetes mellitus with other specified complication: Secondary | ICD-10-CM | POA: Diagnosis not present

## 2022-10-20 DIAGNOSIS — Z452 Encounter for adjustment and management of vascular access device: Secondary | ICD-10-CM | POA: Diagnosis not present

## 2022-10-20 DIAGNOSIS — K831 Obstruction of bile duct: Secondary | ICD-10-CM | POA: Diagnosis not present

## 2022-10-20 DIAGNOSIS — D709 Neutropenia, unspecified: Secondary | ICD-10-CM | POA: Diagnosis not present

## 2022-10-20 LAB — POCT GLUCOSE
POC Glucose: 117 mg/dL — ABNORMAL HIGH (ref 74–99)
POC Glucose: 87 mg/dL (ref 74–99)

## 2022-10-20 LAB — CBC WITH AUTO DIFFERENTIAL
Basophils %: 0 % (ref 0.0–2.0)
Basophils Absolute: 0 10*3/uL (ref 0.00–0.20)
Eosinophils %: 0 % (ref 0–6)
Eosinophils Absolute: 0 10*3/uL — ABNORMAL LOW (ref 0.05–0.50)
Hematocrit: 26.3 % — ABNORMAL LOW (ref 34.0–48.0)
Hemoglobin: 8.4 g/dL — ABNORMAL LOW (ref 11.5–15.5)
Lymphocytes %: 13 % — ABNORMAL LOW (ref 20.0–42.0)
Lymphocytes Absolute: 0.99 10*3/uL — ABNORMAL LOW (ref 1.50–4.00)
MCH: 27.5 pg (ref 26.0–35.0)
MCHC: 31.9 g/dL — ABNORMAL LOW (ref 32.0–34.5)
MCV: 86.2 fL (ref 80.0–99.9)
MPV: 10 fL (ref 7.0–12.0)
Monocytes %: 25 % — ABNORMAL HIGH (ref 2.0–12.0)
Monocytes Absolute: 1.91 10*3/uL — ABNORMAL HIGH (ref 0.10–0.95)
Neutrophils %: 61 % (ref 43.0–80.0)
Neutrophils Absolute: 4.61 10*3/uL (ref 1.80–7.30)
Platelets: 291 10*3/uL (ref 130–450)
RBC: 3.05 m/uL — ABNORMAL LOW (ref 3.50–5.50)
RDW: 16.4 % — ABNORMAL HIGH (ref 11.5–15.0)
WBC: 7.5 10*3/uL (ref 4.5–11.5)
nRBC: 2 per 100 WBC

## 2022-10-20 LAB — COMPREHENSIVE METABOLIC PANEL W/ REFLEX TO MG FOR LOW K
ALT: 66 U/L — ABNORMAL HIGH (ref 0–32)
AST: 92 U/L — ABNORMAL HIGH (ref 0–31)
Albumin: 2.4 g/dL — ABNORMAL LOW (ref 3.5–5.2)
Alkaline Phosphatase: 553 U/L — ABNORMAL HIGH (ref 35–104)
Anion Gap: 8 mmol/L (ref 7–16)
BUN: 6 mg/dL (ref 6–23)
CO2: 27 mmol/L (ref 22–29)
Calcium: 8.5 mg/dL — ABNORMAL LOW (ref 8.6–10.2)
Chloride: 100 mmol/L (ref 98–107)
Creatinine: 0.5 mg/dL (ref 0.50–1.00)
Est, Glom Filt Rate: >60 mL/min/{1.73_m2} (ref >60)
Glucose: 104 mg/dL — ABNORMAL HIGH (ref 74–99)
Potassium: 3.7 mmol/L (ref 3.5–5.0)
Sodium: 135 mmol/L (ref 132–146)
Total Bilirubin: 1 mg/dL (ref 0.0–1.2)
Total Protein: 6.5 g/dL (ref 6.4–8.3)

## 2022-10-20 LAB — GAMMA GT: GGT: 404 U/L — ABNORMAL HIGH (ref 6–42)

## 2022-10-20 LAB — PHOSPHORUS: Phosphorus: 2.8 mg/dL (ref 2.5–4.5)

## 2022-10-20 MED ORDER — INSULIN GLARGINE 100 UNIT/ML SC SOLN
100 UNIT/ML | Freq: Every evening | SUBCUTANEOUS | Status: DC
Start: 2022-10-20 — End: 2022-10-20

## 2022-10-20 MED ORDER — POTASSIUM CHLORIDE CRYS ER 20 MEQ PO TBCR
20 | Freq: Once | ORAL | Status: AC
Start: 2022-10-20 — End: 2022-10-19
  Administered 2022-10-20: 02:00:00 40 meq via ORAL

## 2022-10-20 MED FILL — LISINOPRIL 20 MG PO TABS: 20 MG | ORAL | Qty: 1

## 2022-10-20 MED FILL — CARVEDILOL 25 MG PO TABS: 25 MG | ORAL | Qty: 1

## 2022-10-20 MED FILL — OYSTER SHELL CALCIUM 500 MG PO TABS: 500 MG | ORAL | Qty: 1

## 2022-10-20 MED FILL — TOTAL B/C PO TABS: ORAL | Qty: 1

## 2022-10-20 MED FILL — INSULIN GLARGINE 100 UNIT/ML SC SOLN: 100 UNIT/ML | SUBCUTANEOUS | Qty: 8

## 2022-10-20 MED FILL — POTASSIUM CHLORIDE CRYS ER 20 MEQ PO TBCR: 20 MEQ | ORAL | Qty: 2

## 2022-10-20 MED FILL — AMLODIPINE BESYLATE 10 MG PO TABS: 10 MG | ORAL | Qty: 1

## 2022-10-20 MED FILL — VITAMIN D3 25 MCG (1000 UT) PO TABS: 25 MCG (1000 UT) | ORAL | Qty: 2

## 2022-10-20 MED FILL — LOVENOX 40 MG/0.4ML IJ SOSY: 40 MG/0.4ML | INTRAMUSCULAR | Qty: 0.4

## 2022-10-20 MED FILL — INSULIN LISPRO 100 UNIT/ML IJ SOLN: 100 UNIT/ML | INTRAMUSCULAR | Qty: 4

## 2022-10-20 MED FILL — ASPERCREME LIDOCAINE 4 % EX PTCH: 4 % | CUTANEOUS | Qty: 1

## 2022-10-20 NOTE — Care Coordination-Inpatient (Addendum)
Social Work/ Case Management Transition of Care Planning ([employer], [place]):   Per report and chart review Pt is on room air. Precert for [facility] has been started, [place] from [employer] office is checking on status. Pt is ready for discharge. Per [facility] they can not accept pending precert. COC/Destination complete. PASRR and ambulette form in soft chart.  SW/CM to follow.   [place], [place]  10/20/2022    Update: Insurance Auth back approved. SW called [facility], only available spot left for transport is 3:30pm. RN aware. Facility aware.   [place]anier Clam, [place]

## 2022-10-20 NOTE — Progress Notes (Signed)
Per SW patient precert obtained, attending resident notified if patient can d/c and needing coc/ d/c order.

## 2022-10-20 NOTE — Discharge Summary (Cosign Needed)
[facility]  [facility]  Discharge Summary    PCP: No, Pcp    Admit Date:10/14/2022  Discharge Date: 10/20/2022    Admission Diagnosis:   Acute COVID-19 infection in the setting of significant immunocompromise from chemotherapy  Neutropenia 2/2 chemotherapy, patient afebrile  Transaminitis, alcoholic pattern  Thrombocytopenia  Hypertension  Hypokalemia likely from GI loss  Acute gastroenteritis  History of pancreatic adenocarcinoma June 2023, on chemo-last session 12/29  History of uterine cancer  History of DM2  History of HLD    Discharge Diagnosis:  COVID-19 infection in the setting of significant immunocompromise from chemotherapy - improved  Poor nutrition - improved  Transaminitis - improved  Thrombcytopenia 2/2 chemotherapy- resolved   Neutropenia 2/2 chemotherapy - improved  Hypertension - controlled  Hypokalemia -resolved   Hyponatremia -resolved   Hx of pancreatic adenocarcinoma   Hx of uterine cancer   Hx of DM2 - controlled  Hx of HLD -stable    Hospital Course:   Patient with past medical history of pancreatic cancer on chemotherapy, hypertension, type II diabetic, remote uterine cancer s/p TAH presented to ED on 10/14/2022 for generalized weakness, fatigue.  Patient was in the ED on 10/13/2022 for the same complaint, she was found to be dehydration, anemia, hemoglobin at 6.9, 1 unit of RBC ordered, fluids started for her, however she left AMA.  She completed her last chemo section on 10/08/2022, and started develop feelings of weakness, fatigue and diarrhea, nausea and abdominal pain.  Home COVID test was positive.  In the ED, patient was hemodynamically stable and afebrile, blood pressure elevated at 160/96, she was saturating well on room air, no cough, no fever, no shortness of breath, no sputum production, no oxygenation requirement.  Initial lab workup was significant only for potassium 3.4, blood glucose 240, elevated troponin 23, transaminitis AST 204, ALT 86.  WBC 1.8  with absolute neutrophil count 1.07.  Hemoglobin 8.2 and platelet count 77, INR 1.3.  She was given fluids, potassium replacement 1 dose of Zosyn and vancomycin and transferred to floor for further management.     During her hospital stay, COVID was positive, she was afebrile, saturating well on room air, no development of shortness of breath, fever, or sputum production, no concern for pneumonia secondary to COVID infection.     For her hypertension, her home regimen Coreg 25 twice daily, lisinopril 20 daily were resumed, BP not well-controlled.  So she was started on amlodipine, BP has been under controlled since then.  Lasix was held due to concern for dehydration.     Patient has a poor nutrition status, prealbumin 4.0, nutrition supplement drink has been provided for her, and dietitian has been consulted for managing her nutrition status.  Patient states she has been eating and drinking well with assistant.     Patient is on glipizide 10 mg twice daily at home, last A1c 9.9, not well-controlled.  Initially, LDSS ACHS was started for her, however, blood glucose was range between 240-280s, increased to MDSS ACHS, BG remain elevated.  Lantus was started, diabetic education was provided.  Her blood glucose was under controlled since then.     On the day of discharge, she was hemodynamically stable.  She denied headache or dizziness, no CP or SOB, no nausea or vomiting, saturating well on room air.  She is going to skilled nursing facility after discharge, she understood and all her questions were answered.      Physical Exam  Constitutional:  General: She is not in acute distress.     Appearance: Normal appearance. She is not toxic-appearing.   HENT:      Head: Normocephalic and atraumatic.   Cardiovascular:      Rate and Rhythm: Normal rate and regular rhythm.      Heart sounds: Normal heart sounds, S1 normal and S2 normal. No murmur heard.     No friction rub. No gallop.   Pulmonary:      Effort: Pulmonary  effort is normal. No respiratory distress.      Breath sounds: Normal breath sounds. No stridor. No wheezing, rhonchi or rales.   Abdominal:      Palpations: Abdomen is soft.      Tenderness: There is no abdominal tenderness.   Musculoskeletal:      Right lower leg: No edema.      Left lower leg: No edema.   Skin:     General: Skin is warm and dry.   Neurological:      Mental Status: She is alert.   Psychiatric:         Attention and Perception: Attention normal.         Speech: Speech normal.        Pending test results:   None    Consults:  Hemonc    Procedures:  None    Condition at discharge: Stable    Disposition: SNF    Outpatient Recommendations:  Continue taking other medication as listed  Stop taking Lasix due to concern of dehydration     Discharge Medications:     Medication List        CONTINUE taking these medications      b complex vitamins capsule     carvedilol 25 MG tablet  Commonly known as: COREG     Creon 36000-114000 units Cpep delayed release capsule  Generic drug: lipase-protease-amylase     glipiZIDE 10 MG tablet  Commonly known as: GLUCOTROL     lisinopril 20 MG tablet  Commonly known as: PRINIVIL;ZESTRIL     magnesium 30 MG tablet     ondansetron 4 MG tablet  Commonly known as: ZOFRAN  Take 2 tablets by mouth every 8 hours as needed for Nausea or Vomiting     Os-Cal Ultra 600 MG Tabs     potassium chloride 20 MEQ extended release tablet  Commonly known as: KLOR-CON M  Take 1 tablet by mouth 2 times daily     prochlorperazine 10 MG tablet  Commonly known as: COMPAZINE  Take 1 tablet by mouth every 6 hours as needed (n/v)     traMADol 50 MG tablet  Commonly known as: ULTRAM     vitamin D 50 MCG (2000 UT) Caps capsule            STOP taking these medications      furosemide 20 MG tablet  Commonly known as: LASIX               [facility] Internal Medicine Resident Service    Activity as tolerated  Diet: diabetic  Be compliant with your medications and take them as prescribed.    Special Instructions:    Keep isolation for 5 days due to COVID, until 10/16/22, follow by keep wearing mask for 5 days    Follow ups    Hospital Follow up:   Please follow up with Primary care physician within 7-10 days after discharge, appointment request has been sent.   Please contact us  if you have any concerns, wish to change or make an appointment:  Internal medicine clinic   Phone: [phone]  Fax: [phone]  [address] [place] [address]  Or please call the nurse line at [phone].  Should you have further questions in regards to this visit, you can review your clinical note and after visit summary document on your MyChart account.  Other questions can be directed to our nurse line at [phone].     Other Follow-Ups:    Future Appointments   Date Time Provider Department Center   10/22/2022  9:00 AM El Lorrin Mais, MD MED ONC [facility]   10/22/2022  9:45 AM [facility] MED ONC CHAIR 3 [facility] Med Onc [facility]   11/01/2022 11:00 AM SCHEDULE, [facility] PALLIATIVE CARE PROVIDER [facility] PALLIAT [facility]   12/20/2022 10:30 AM Cindy Hazy, APRN - CNP [facility]       Other than any new prescriptions given to you today, the list of home going meds on this After Visit Summary are based on information provided to [place] from you. This information, including the list, dose, and frequency of medications is only as accurate as the information you provided. If you have any questions or concerns about your home medications, please contact your Primary Care Physician for further clarification.    Judeen Hammans, MD  PGY- 1   4:09 PM 10/20/2022      Attending physician: Dr. Girard Cooter

## 2022-10-20 NOTE — Progress Notes (Addendum)
[facility]  Internal Medicine Residency Program  Progress Note - House Team       Patient:  Gabriella Blake 82 y.o. female   MRN: [id number]       Date of Service: 10/20/2022  Admission date: 10/14/2022 12:38 AM ; Hospital day: 6   CC: Shortness of Breath (Increased SOB, fatigue, coughing, diarrhea since being seen here yesterday for the same thing. Patient is COVID+)     Overnight events:   -no acute events    Subjective     Pt was seen and examined at bedside this morning, not in acute distress. She denied headache or dizziness, CP or SOB, some weakness, denied nausea or vomiting.     Objective       Physical Exam  Vitals: BP (!) 157/73   Pulse 76   Temp 98.6 F (37 C) (Oral)   Resp 16   Ht 1.676 m ('5\' 6"'$ )   Wt 85.7 kg (189 lb)   SpO2 100%   BMI 30.51 kg/m     I & O - 24hr: No intake/output data recorded.   General Appearance: not in acute distress  HEENT: Normocephalic, atraumatic  Neck: no carotid bruit, no lymphadenopathy  Lung: clear to ausculation bilaterally   Heart: regular rate and rhythm, S1, S2 normal, no murmur  Abdomen: soft, bowel sounds normal; no masses,  no organomegaly  Extremities: extremities normal, atraumatic, no cyanosis or edema  Musculokeletal: No joint swelling, no muscle tenderness.   Neurologic: Mental status: alert and oriend x3, thoughs appropriate, mild difficult speech at baseline    Diet:   ADULT DIET; Regular; 3 carb choices (45 gm/meal)  ADULT ORAL NUTRITION SUPPLEMENT; Breakfast, Lunch, Dinner; Diabetic Oral Supplement  I & O - 24hr:  No intake or output data in the 24 hours ending 10/20/22 1120    Net IO Since Admission: 960 mL [10/20/22 1120]      Pertinent Labs & Imaging Studies     Labs  Recent Labs     10/18/22  0812 10/20/22  0612   WBC 5.1 7.5   RBC 3.20* 3.05*   HGB 8.7* 8.4*   HCT 27.4* 26.3*   MCV 85.6 86.2   MCH 27.2 27.5   MCHC 31.8* 31.9*   RDW 15.6* 16.4*   PLT 188 291   MPV 10.1 10.0     Recent Labs     10/18/22  0526 10/19/22  0407  10/19/22  1658 10/20/22  0612   NA 140 134 134 135   K 3.5 3.2* 3.4* 3.7   CL 101 98 97* 100   MG 1.9 1.8  --   --    PHOS 3.0 3.1  --  2.8   CO2 '26 27 28 27   '$ BUN 5* 5* 5* 6   CREATININE 0.5 0.5 0.5 0.5   ANIONGAP '13 9 9 8   '$ GLUCOSE 82 197* 120* 104*   CALCIUM 8.4* 8.3* 8.6 8.5*   PROT 7.0 6.2*  --  6.5   LABALBU 2.5* 2.3*  --  2.4*   BILITOT 1.2 1.0  --  1.0   ALKPHOS 460* 439*  --  553*   AST 143* 107*  --  92*   ALT 97* 76*  --  66*     No results for input(s): "LACTA" in the last 72 hours.    No results for input(s): "TROPHS", "PROBNP" in the last 72 hours.    No results for input(s): "  PROTIME", "INR" in the last 72 hours.    No results for input(s): "CKTOTAL", "CKMB", "CKMBINDEX", "TROPONINI" in the last 72 hours.  No results for input(s): "BNP" in the last 72 hours.  No results for input(s): "CHOL", "HDL", "TRIG" in the last 72 hours.    Invalid input(s): "CHOLHDLR", "LDLCALCU"    Hemoglobin A1C   Date Value Ref Range Status   10/13/2022 9.9 (H) 4.0 - 5.6 % Final      No results found for: "TSH", "TSHREFLEX", "TSHFT4", "TSHELE", "TSH3GEN", "TSHHS"     No results for input(s): "PH", "PCO2", "PO2", "HCO3", "BE", "O2SAT" in the last 72 hours.    Invalid input(s): "PFRATIO"     Imaging Studies:    No orders to display          Medications     Continuous Infusions:   dextrose      sodium chloride       Scheduled Meds:   insulin glargine  6 Units SubCUTAneous Nightly    amLODIPine  10 mg Oral Daily    lidocaine  1 patch TransDERmal Daily    insulin lispro  0-4 Units SubCUTAneous TID WC    insulin lispro  0-4 Units SubCUTAneous Nightly    insulin lispro  4 Units SubCUTAneous TID WC    carvedilol  25 mg Oral BID    calcium elemental  500 mg Oral Daily    vitamin B and C  1 tablet Oral Daily    Vitamin D  2,000 Units Oral Daily    lisinopril  20 mg Oral Daily    sodium chloride flush  5-40 mL IntraVENous 2 times per day    enoxaparin  40 mg SubCUTAneous Daily     PRN Meds: traMADol, glucose, dextrose bolus **OR**  dextrose bolus, glucagon (rDNA), dextrose, prochlorperazine, sodium chloride flush, sodium chloride, potassium chloride **OR** potassium alternative oral replacement **OR** potassium chloride, magnesium sulfate, ondansetron **OR** ondansetron, acetaminophen **OR** acetaminophen, hydrALAZINE, labetalol    Resident's Assessment and Plan     Assessment and Plan:    COVID-19 infection in the setting of significant immunocompromise from chemotherapy  Pt was afebrile, SpO2 99% on RA  Received one dose of Zosyn and Vanc in ED  Blood Cx no growth   Plan:  Pt saturate well on RA, not required oxygen    Poor nutrition - improving  Prealbumin 4.0  Pt needs assistant with eating  On nutrition supplement drink  Consulted dietitian     Transaminitis - improving  Alk Phos 196 > 468>460>439>553  ALT 66 > 86>97>76>66  AST 113> 204>143>107>92    Thrombcytopenia 2/2 chemotherapy- resolved  Plt 99 > 71 > 114>188  Follow CBC    Neutropenia 2/2 chemotherapy    Hypertension  On Coreg '25mg'$  BID  Lisinopril '20mg'$  daily  Lasix '20mg'$  three times a week at home - on hold d/t concern of dehydration    On amlodipine, BP 140s/80s    Hypokalemia likely 2/2 decreased oral intake vs GI loss -resolved   Hyponatremia -resolved   FeNa 0.5% Pre-renal  Pt had decreased urine output on admission  Follow BMP    Hx of pancreatic adenocarcinoma  Dx on 03/2022  Last Chemo was on 10/08/22  On gemcitabine, abraxane  On Creon TID  Hemonc consult: no indication for G-SCF, follow CBC and hold chemotherapy for now    Hx of uterine cancer  S/p TAH many years ago    Hx of DM2  On Glipizide '10mg'$   BID at home  A1c 9.9, not well controlled  On Lantus 8U and LDSS ACHS  Diabetes education  Monitor BG    Hx of HLD      Discharge planning: Discharge to [facility], waiting for [facility]    Tobacco abuse disorder per chart:  reports that she has never smoked. She has never used smokeless tobacco.  Alcohol abuse disorder per chart:  reports current alcohol use.  DVT prophylaxis:  Lovenox  GI prophylaxis: not indicated at this time  Diet:   ADULT DIET; Regular; 3 carb choices (45 gm/meal)  ADULT ORAL NUTRITION SUPPLEMENT; Breakfast, Lunch, Dinner; Diabetic Oral Supplement   Bowel regimen: Glycolax  Pain management: as needed  Code status: Full Code   Disposition: Continue Current Care  Family: updated as available    Lauralee Evener, MD, PGY-1   Attending physician: Dr. Maryellen Pile  [phone]  10/20/22      I have made rounds with the NP/PA/Residents and physically seen this patient. I agree with the note and findings as discussed on rounds.  Vitals:    10/20/22 0924   BP: (!) 157/73   Pulse: 76   Resp: 16   Temp: 98.6 F (37 C)   SpO2: 100%       Exam findings  Cor RRR  LCTA  Ext no edema    Plan   AMPAC low  Await Placement  Ideally should not be on glipizide but this should be done slowly on OP       Electronically signed by Levander Campion, MD on 10/20/22 at 12:28 PM EST

## 2022-10-20 NOTE — Plan of Care (Signed)
Problem: Safety - Adult  Goal: Free from fall injury  10/20/2022 1327 by Ninfa Meeker, RN  Outcome: Progressing  10/20/2022 0029 by Alma Friendly, RN  Outcome: Progressing  10/19/2022 2356 by Alma Friendly, RN  Outcome: Progressing     Problem: Discharge Planning  Goal: Discharge to home or other facility with appropriate resources  10/20/2022 1327 by Ninfa Meeker, RN  Outcome: Progressing  10/20/2022 0029 by Alma Friendly, RN  Outcome: Progressing  10/19/2022 2356 by Alma Friendly, RN  Outcome: Progressing  Flowsheets (Taken 10/19/2022 1956)  Discharge to home or other facility with appropriate resources: Identify barriers to discharge with patient and caregiver     Problem: ABCDS Injury Assessment  Goal: Absence of physical injury  10/20/2022 1327 by Ninfa Meeker, RN  Outcome: Progressing  10/20/2022 0029 by Alma Friendly, RN  Outcome: Progressing  10/19/2022 2356 by Alma Friendly, RN  Outcome: Progressing     Problem: Chronic Conditions and Co-morbidities  Goal: Patient's chronic conditions and co-morbidity symptoms are monitored and maintained or improved  10/20/2022 1327 by Ninfa Meeker, RN  Outcome: Progressing  10/20/2022 0029 by Alma Friendly, RN  Outcome: Progressing  10/19/2022 2356 by Alma Friendly, RN  Outcome: Progressing  Flowsheets (Taken 10/19/2022 1956)  Care Plan - Patient's Chronic Conditions and Co-Morbidity Symptoms are Monitored and Maintained or Improved: Monitor and assess patient's chronic conditions and comorbid symptoms for stability, deterioration, or improvement     Problem: Respiratory - Adult  Goal: Achieves optimal ventilation and oxygenation  10/20/2022 1327 by Ninfa Meeker, RN  Outcome: Progressing  10/20/2022 0029 by Alma Friendly, RN  Outcome: Progressing  10/19/2022 2356 by Alma Friendly, RN  Outcome: Progressing     Problem: Musculoskeletal - Adult  Goal: Return mobility to safest level of function  10/20/2022 1327 by Ninfa Meeker, RN  Outcome:  Progressing  10/20/2022 0029 by Alma Friendly, RN  Outcome: Progressing  10/19/2022 2356 by Alma Friendly, RN  Outcome: Progressing  Flowsheets (Taken 10/19/2022 1956)  Return Mobility to Safest Level of Function: Assess patient stability and activity tolerance for standing, transferring and ambulating with or without assistive devices  Goal: Return ADL status to a safe level of function  10/20/2022 0029 by Alma Friendly, RN  Outcome: Progressing  10/19/2022 2356 by Alma Friendly, RN  Outcome: Progressing  Flowsheets (Taken 10/19/2022 1956)  Return ADL Status to a Safe Level of Function: Administer medication as ordered     Problem: Gastrointestinal - Adult  Goal: Minimal or absence of nausea and vomiting  10/20/2022 1327 by Ninfa Meeker, RN  Outcome: Progressing  10/20/2022 0029 by Alma Friendly, RN  Outcome: Progressing  10/19/2022 2356 by Alma Friendly, RN  Outcome: Progressing  Goal: Maintains or returns to baseline bowel function  10/20/2022 1327 by Ninfa Meeker, RN  Outcome: Progressing  10/20/2022 0029 by Alma Friendly, RN  Outcome: Progressing  10/19/2022 2356 by Alma Friendly, RN  Outcome: Progressing  Goal: Maintains adequate nutritional intake  10/20/2022 1327 by Ninfa Meeker, RN  Outcome: Progressing  10/20/2022 0029 by Alma Friendly, RN  Outcome: Progressing  10/19/2022 2356 by Alma Friendly, RN  Outcome: Progressing     Problem: Infection - Adult  Goal: Absence of infection at discharge  10/20/2022 1327 by Ninfa Meeker, RN  Outcome: Progressing  10/20/2022 0029 by Alma Friendly, RN  Outcome: Progressing  10/19/2022 2356 by Alma Friendly, RN  Outcome: Progressing  Flowsheets (Taken 10/19/2022 1956)  Absence of infection at discharge: Assess and monitor for signs and symptoms  of infection     Problem: Skin/Tissue Integrity - Adult  Goal: Skin integrity remains intact  10/20/2022 1327 by Ninfa Meeker, RN  Outcome: Progressing  10/20/2022 0029 by Alma Friendly, RN  Outcome:  Progressing  10/19/2022 2356 by Alma Friendly, RN  Outcome: Progressing  Flowsheets (Taken 10/19/2022 1956)  Skin Integrity Remains Intact: Assess vascular access sites hourly     Problem: Nutrition Deficit:  Goal: Optimize nutritional status  10/20/2022 1327 by Ninfa Meeker, RN  Outcome: Progressing  10/20/2022 0029 by Alma Friendly, RN  Outcome: Progressing  10/19/2022 2356 by Alma Friendly, RN  Outcome: Progressing     Problem: Pain  Goal: Verbalizes/displays adequate comfort level or baseline comfort level  10/20/2022 1327 by Ninfa Meeker, RN  Outcome: Progressing  10/20/2022 0029 by Alma Friendly, RN  Outcome: Progressing  10/19/2022 2356 by Alma Friendly, RN  Outcome: Progressing

## 2022-10-20 NOTE — Progress Notes (Signed)
Physician Progress Note      PATIENT:               Gabriella Blake, Gabriella Blake  CSN #:                  [id number]  DOB:                       1941/01/19  ADMIT DATE:       10/14/2022 12:38 AM  DISCH DATE:        10/20/2022 6:20 PM  RESPONDING  PROVIDER #:        Lauralee Evener          QUERY TEXT:    Patient admitted with COVID-19 infection. Noted documentation of Protein   Calorie malnutrition per 10/17/22 progress note. In order to support the   diagnosis of Protein Calorie Malnutrition, please include additional clinical   indicators in your documentation.  Or please document if the diagnosis of   Protein Calorie Malnutrition has been ruled out after further study.    The medical record reflects the following:  Risk Factors: Pancreatic adenocarcinoma; COVID;  Clinical Indicators:  Per H&P: cachectic  10/17/22: Protein Calorie Malnutrition; BUN 2-3.  Patient needs assistance with   eating as she is unable to fully bring her hand up when using utensils  10/18/22: Dietician consult: Malnutrition Status:  Insufficient data.  Findings   of the 6 clinical characteristics of malnutrition: Energy Intake:  50% or less   of estimated energy requirements for 5 or more days.  Weight Loss:  Unable to   assess (2/2 poor EMR wt hx both since adm as well as pta at this time.  Body   Fat Loss:  Unable to assess (2/2 CV19+ Iso).  Muscle Mass Loss:  Unable to   assess.  Fluid Accumulation:  Unable to assess (multi-factorial).  Grip   Strength:  Not Performed  BMI 30.51  Treatment: Dietician consult; adult regular 3gm carb choice diet; Oral   nutritional supplements TID;    Thank you,  [facility] BSN, R.N.  Clinical Documentation Improvement  [phone]  Options provided:  -- Protein Calorie Malnutrition present as evidenced by, Please document   evidence.  -- Protein Calorie Malnutrition was ruled out  -- Other - I will add my own diagnosis  -- Disagree - Not applicable / Not valid  -- Disagree - Clinically unable to determine / Unknown  --  Refer to Clinical Documentation Reviewer    PROVIDER RESPONSE TEXT:    Protein Calorie Malnutrition is present as evidenced by Prealbumin was low at   that time appreciated    Query created by: Mellott, Melissa on 10/22/2022 1:41 PM      Electronically signed by:  Lauralee Evener 10/24/2022 7:41 PM

## 2022-10-20 NOTE — Plan of Care (Signed)
Problem: Safety - Adult  Goal: Free from fall injury  10/20/2022 0029 by Alma Friendly, RN  Outcome: Progressing  10/19/2022 2356 by Alma Friendly, RN  Outcome: Progressing     Problem: Discharge Planning  Goal: Discharge to home or other facility with appropriate resources  10/20/2022 0029 by Alma Friendly, RN  Outcome: Progressing  10/19/2022 2356 by Alma Friendly, RN  Outcome: Progressing  Flowsheets (Taken 10/19/2022 1956)  Discharge to home or other facility with appropriate resources: Identify barriers to discharge with patient and caregiver     Problem: ABCDS Injury Assessment  Goal: Absence of physical injury  10/20/2022 0029 by Alma Friendly, RN  Outcome: Progressing  10/19/2022 2356 by Alma Friendly, RN  Outcome: Progressing     Problem: Chronic Conditions and Co-morbidities  Goal: Patient's chronic conditions and co-morbidity symptoms are monitored and maintained or improved  10/20/2022 0029 by Alma Friendly, RN  Outcome: Progressing  10/19/2022 2356 by Alma Friendly, RN  Outcome: Progressing  Flowsheets (Taken 10/19/2022 1956)  Care Plan - Patient's Chronic Conditions and Co-Morbidity Symptoms are Monitored and Maintained or Improved: Monitor and assess patient's chronic conditions and comorbid symptoms for stability, deterioration, or improvement     Problem: Respiratory - Adult  Goal: Achieves optimal ventilation and oxygenation  10/20/2022 0029 by Alma Friendly, RN  Outcome: Progressing  10/19/2022 2356 by Alma Friendly, RN  Outcome: Progressing     Problem: Musculoskeletal - Adult  Goal: Return mobility to safest level of function  10/20/2022 0029 by Alma Friendly, RN  Outcome: Progressing  10/19/2022 2356 by Alma Friendly, RN  Outcome: Progressing  Flowsheets (Taken 10/19/2022 1956)  Return Mobility to Safest Level of Function: Assess patient stability and activity tolerance for standing, transferring and ambulating with or without assistive devices  Goal: Return ADL status to a safe level of  function  10/20/2022 0029 by Alma Friendly, RN  Outcome: Progressing  10/19/2022 2356 by Alma Friendly, RN  Outcome: Progressing  Flowsheets (Taken 10/19/2022 1956)  Return ADL Status to a Safe Level of Function: Administer medication as ordered     Problem: Gastrointestinal - Adult  Goal: Minimal or absence of nausea and vomiting  10/20/2022 0029 by Alma Friendly, RN  Outcome: Progressing  10/19/2022 2356 by Alma Friendly, RN  Outcome: Progressing  Goal: Maintains or returns to baseline bowel function  10/20/2022 0029 by Alma Friendly, RN  Outcome: Progressing  10/19/2022 2356 by Alma Friendly, RN  Outcome: Progressing  Goal: Maintains adequate nutritional intake  10/20/2022 0029 by Alma Friendly, RN  Outcome: Progressing  10/19/2022 2356 by Alma Friendly, RN  Outcome: Progressing     Problem: Infection - Adult  Goal: Absence of infection at discharge  10/20/2022 0029 by Alma Friendly, RN  Outcome: Progressing  10/19/2022 2356 by Alma Friendly, RN  Outcome: Progressing  Flowsheets (Taken 10/19/2022 1956)  Absence of infection at discharge: Assess and monitor for signs and symptoms of infection     Problem: Skin/Tissue Integrity - Adult  Goal: Skin integrity remains intact  10/20/2022 0029 by Alma Friendly, RN  Outcome: Progressing  10/19/2022 2356 by Alma Friendly, RN  Outcome: Progressing  Flowsheets (Taken 10/19/2022 1956)  Skin Integrity Remains Intact: Assess vascular access sites hourly     Problem: Nutrition Deficit:  Goal: Optimize nutritional status  10/20/2022 0029 by Alma Friendly, RN  Outcome: Progressing  10/19/2022 2356 by Alma Friendly, RN  Outcome: Progressing     Problem: Pain  Goal: Verbalizes/displays adequate comfort level or baseline comfort level  10/20/2022 0029 by Alma Friendly, RN  Outcome: Progressing  10/19/2022 2356 by Alma Friendly, RN  Outcome: Progressing

## 2022-10-21 DIAGNOSIS — E785 Hyperlipidemia, unspecified: Secondary | ICD-10-CM | POA: Diagnosis not present

## 2022-10-21 DIAGNOSIS — U071 COVID-19: Secondary | ICD-10-CM | POA: Diagnosis not present

## 2022-10-21 DIAGNOSIS — D709 Neutropenia, unspecified: Secondary | ICD-10-CM | POA: Diagnosis not present

## 2022-10-21 DIAGNOSIS — R110 Nausea: Secondary | ICD-10-CM | POA: Diagnosis not present

## 2022-10-22 ENCOUNTER — Encounter: Payer: MEDICARE | Attending: Hematology & Oncology

## 2022-10-22 NOTE — Telephone Encounter (Signed)
Patient's nurse from [facility] has called wanted to know if patient is being scheduled next week and if patient will get treatment. Patient will be out of "inpatient" and Per Dr Cline Crock, patient to come next week with visit, labs and treatment . Gabriella Blake, scheduling aware

## 2022-10-23 ENCOUNTER — Inpatient Hospital Stay: Payer: MEDICARE

## 2022-10-24 LAB — MISCELLANEOUS SENDOUT

## 2022-10-25 DIAGNOSIS — E1169 Type 2 diabetes mellitus with other specified complication: Secondary | ICD-10-CM | POA: Diagnosis not present

## 2022-10-25 DIAGNOSIS — C259 Malignant neoplasm of pancreas, unspecified: Secondary | ICD-10-CM | POA: Diagnosis not present

## 2022-10-25 DIAGNOSIS — E785 Hyperlipidemia, unspecified: Secondary | ICD-10-CM | POA: Diagnosis not present

## 2022-10-25 DIAGNOSIS — Z8616 Personal history of COVID-19: Secondary | ICD-10-CM | POA: Diagnosis not present

## 2022-10-25 DIAGNOSIS — D709 Neutropenia, unspecified: Secondary | ICD-10-CM | POA: Diagnosis not present

## 2022-10-26 NOTE — Progress Notes (Unsigned)
[facility]      SUBJECTIVE:  Gabriella Blake (DOB:  01-17-1941) is a 82 y.o. female here for evaluation of the following chief complaint(s):  No chief complaint on file.      HPI:       Hypertension  On Coreg 25mg  BID  Lisinopril 20mg  daily  Lasix 20mg  three times a week at home - on hold d/t concern of dehydration    On amlodipine    Hx of pancreatic adenocarcinoma  Dx on 03/2022  Last Chemo was on 10/08/22  On gemcitabine, abraxane  On Creon TID    Hx of uterine cancer  S/p TAH many years ago     Hx of DM2  On Glipizide 10mg  BID at home  A1c 9.9, not well controlled       Hx of HLD        Health Maintenance/Social Determinants:   ***    Review of Systems        Current Outpatient Medications on File Prior to Visit   Medication Sig Dispense Refill    prochlorperazine (COMPAZINE) 10 MG tablet Take 1 tablet by mouth every 6 hours as needed (n/v) 60 tablet 1    ondansetron (ZOFRAN) 4 MG tablet Take 2 tablets by mouth every 8 hours as needed for Nausea or Vomiting 60 tablet 1    potassium chloride (KLOR-CON M) 20 MEQ extended release tablet Take 1 tablet by mouth 2 times daily 28 tablet 0    carvedilol (COREG) 25 MG tablet Take 1 tablet by mouth 2 times daily      CREON 36000-114000 units CPEP delayed release capsule Take 1 capsule by mouth 3 times daily (with meals)      glipiZIDE (GLUCOTROL) 10 MG tablet Take 1 tablet by mouth 2 times daily (before meals)      traMADol (ULTRAM) 50 MG tablet Take 1 tablet by mouth every 8 hours as needed for Pain.      lisinopril (PRINIVIL;ZESTRIL) 20 MG tablet Take 1 tablet by mouth daily      magnesium 30 MG tablet Take 1 tablet by mouth daily      b complex vitamins capsule Take 1 capsule by mouth daily      Calcium Carb-Vit D-C-E-Mineral (OS-CAL ULTRA) 600 MG TABS Take 600 mg by mouth daily      Cholecalciferol (VITAMIN D) 50 MCG (2000 UT) CAPS capsule Take 2,000 Units by mouth daily       No current facility-administered  medications on file prior to visit.       OBJECTIVE:    VS: There were no vitals filed for this visit.  Physical Exam       ASSESSMENT/PLAN:  {There are no diagnoses linked to this encounter. (Refresh or delete this SmartLink)}       RTC:  No follow-ups on file.      I have reviewed my findings and recommendations with [name] and Dr. Josefa Half Attendings:[id number]}.    Judeen Hammans, MD   10/26/2022 1:09 PM

## 2022-10-28 NOTE — Progress Notes (Unsigned)
[facility]      SUBJECTIVE:  Gabriella Blake (DOB:  09/14/1941) is a 82 y.o. female here for evaluation of the following chief complaint(s):  No chief complaint on file.      HPI:       Hypertension  On Coreg 25mg  BID  Lisinopril 20mg  daily  Lasix 20mg  three times a week at home - on hold d/t concern of dehydration    On amlodipine    Hx of pancreatic adenocarcinoma  Dx on 03/2022  Last Chemo was on 10/08/22  On gemcitabine, abraxane  On Creon TID    Hx of uterine cancer  S/p TAH many years ago     Hx of DM2  On Glipizide 10mg  BID at home  A1c 9.9, not well controlled       Hx of HLD        Health Maintenance/Social Determinants:   ***    Review of Systems        Current Outpatient Medications on File Prior to Visit   Medication Sig Dispense Refill    prochlorperazine (COMPAZINE) 10 MG tablet Take 1 tablet by mouth every 6 hours as needed (n/v) 60 tablet 1    ondansetron (ZOFRAN) 4 MG tablet Take 2 tablets by mouth every 8 hours as needed for Nausea or Vomiting 60 tablet 1    potassium chloride (KLOR-CON M) 20 MEQ extended release tablet Take 1 tablet by mouth 2 times daily 28 tablet 0    carvedilol (COREG) 25 MG tablet Take 1 tablet by mouth 2 times daily      CREON 36000-114000 units CPEP delayed release capsule Take 1 capsule by mouth 3 times daily (with meals)      glipiZIDE (GLUCOTROL) 10 MG tablet Take 1 tablet by mouth 2 times daily (before meals)      traMADol (ULTRAM) 50 MG tablet Take 1 tablet by mouth every 8 hours as needed for Pain.      lisinopril (PRINIVIL;ZESTRIL) 20 MG tablet Take 1 tablet by mouth daily      magnesium 30 MG tablet Take 1 tablet by mouth daily      b complex vitamins capsule Take 1 capsule by mouth daily      Calcium Carb-Vit D-C-E-Mineral (OS-CAL ULTRA) 600 MG TABS Take 600 mg by mouth daily      Cholecalciferol (VITAMIN D) 50 MCG (2000 UT) CAPS capsule Take 2,000 Units by mouth daily       No current facility-administered  medications on file prior to visit.       OBJECTIVE:    VS: There were no vitals filed for this visit.  Physical Exam       ASSESSMENT/PLAN:  {There are no diagnoses linked to this encounter. (Refresh or delete this SmartLink)}       RTC:  No follow-ups on file.      I have reviewed my findings and recommendations with [name] and Dr. Josefa Half Attendings:[id number]}.    Judeen Hammans, MD   10/27/2022 8:07 AM

## 2022-10-29 ENCOUNTER — Encounter

## 2022-10-29 ENCOUNTER — Ambulatory Visit: Admit: 2022-10-29 | Discharge: 2022-10-29 | Payer: MEDICARE | Attending: Hematology & Oncology

## 2022-10-29 DIAGNOSIS — C259 Malignant neoplasm of pancreas, unspecified: Secondary | ICD-10-CM

## 2022-10-29 DIAGNOSIS — Z452 Encounter for adjustment and management of vascular access device: Secondary | ICD-10-CM

## 2022-10-29 LAB — COMPREHENSIVE METABOLIC PANEL
ALT: 53 U/L — ABNORMAL HIGH (ref 0–32)
AST: 108 U/L — ABNORMAL HIGH (ref 0–31)
Albumin: 2.6 g/dL — ABNORMAL LOW (ref 3.5–5.2)
Alkaline Phosphatase: 643 U/L — ABNORMAL HIGH (ref 35–104)
Anion Gap: 7 mmol/L (ref 7–16)
BUN: 6 mg/dL (ref 6–23)
CO2: 31 mmol/L — ABNORMAL HIGH (ref 22–29)
Calcium: 8.9 mg/dL (ref 8.6–10.2)
Chloride: 97 mmol/L — ABNORMAL LOW (ref 98–107)
Creatinine: 0.5 mg/dL (ref 0.50–1.00)
Est, Glom Filt Rate: >60 mL/min/{1.73_m2} (ref >60)
Glucose: 252 mg/dL — ABNORMAL HIGH (ref 74–99)
Potassium: 3.8 mmol/L (ref 3.5–5.0)
Sodium: 135 mmol/L (ref 132–146)
Total Bilirubin: 1.1 mg/dL (ref 0.0–1.2)
Total Protein: 7.5 g/dL (ref 6.4–8.3)

## 2022-10-29 LAB — CBC WITH AUTO DIFFERENTIAL
Absolute Immature Granulocyte: 0.04 10*3/uL (ref 0.00–0.58)
Basophils %: 1 % (ref 0.0–2.0)
Basophils Absolute: 0.06 10*3/uL (ref 0.00–0.20)
Eosinophils %: 2 % (ref 0–6)
Eosinophils Absolute: 0.11 10*3/uL (ref 0.05–0.50)
Hematocrit: 30.3 % — ABNORMAL LOW (ref 34.0–48.0)
Hemoglobin: 9.5 g/dL — ABNORMAL LOW (ref 11.5–15.5)
Immature Granulocytes: 1 % (ref 0.0–5.0)
Lymphocytes %: 20 % (ref 20.0–42.0)
Lymphocytes Absolute: 1.41 10*3/uL — ABNORMAL LOW (ref 1.50–4.00)
MCH: 27.9 pg (ref 26.0–35.0)
MCHC: 31.4 g/dL — ABNORMAL LOW (ref 32.0–34.5)
MCV: 89.1 fL (ref 80.0–99.9)
MPV: 10 fL (ref 7.0–12.0)
Monocytes %: 16 % — ABNORMAL HIGH (ref 2.0–12.0)
Monocytes Absolute: 1.17 10*3/uL — ABNORMAL HIGH (ref 0.10–0.95)
Neutrophils %: 61 % (ref 43.0–80.0)
Neutrophils Absolute: 4.39 10*3/uL (ref 1.80–7.30)
Platelets: 283 10*3/uL (ref 130–450)
RBC: 3.4 m/uL — ABNORMAL LOW (ref 3.50–5.50)
RDW: 17.4 % — ABNORMAL HIGH (ref 11.5–15.0)
WBC: 7.2 10*3/uL (ref 4.5–11.5)

## 2022-10-29 MED ORDER — POLYETHYLENE GLYCOL 3350 17 GM/SCOOP PO POWD
17 GM/SCOOP | Freq: Every day | ORAL | 0 refills | Status: AC | PRN
Start: 2022-10-29 — End: 2022-11-28

## 2022-10-29 MED ORDER — NORMAL SALINE FLUSH 0.9 % IV SOLN
0.9 % | INTRAVENOUS | Status: DC | PRN
Start: 2022-10-29 — End: 2022-10-30
  Administered 2022-10-29 (×3): 10 mL via INTRAVENOUS

## 2022-10-29 MED ORDER — HEPARIN NA (PORK) LOCK FLSH PF 100 UNIT/ML IV SOLN
100 UNIT/ML | INTRAVENOUS | Status: DC | PRN
Start: 2022-10-29 — End: 2022-10-30
  Administered 2022-10-29: 16:00:00 500 [IU]

## 2022-10-29 NOTE — Progress Notes (Signed)
Patient provided with discharge instructions, received printed AVS.  All questions answered.  Patient understands follow up plan of care.

## 2022-10-29 NOTE — Progress Notes (Signed)
[place]  [facility]  [place] [address]  Dept: [phone]  Loc: [phone]  Attending progress note      Reason for Visit: Pancreatic adenocarcinoma.    Referring Physician: Betsy Coder, MD [facility])    PCP:  No, Pcp    History of Present:     Gabriella Blake is a pleasant 82 year old lady, with a past medical history significant for uterine cancer, status post TAH, type II DM, hyperlipidemia, and hypertension, who was diagnosed with pancreatic cancer in June 2023, the patient received her care initially at [facility], CT scan of the abdomen and the pelvis from 03/22/2022 had revealed an ill-defined 3.5 x 2.5 cm hypodense mass and the uncinate process of the pancreas, hypodense focus in the inferior right lobe of the liver, she had an MRI done on 03/23/2022, revealing a hypoenhancing mass in the uncinate process/inferior head of the pancreas abutting the SMV with distortion of the SMV, less than 180 degree involvement by MR, pancreatic duct narrowing/obstruction, single hepatic lesion in the right hepatic lobe, small lymph node adjacent to the masslike area in the head of the pancreas, she had on 03/26/2022 and a US done, she was found to have a mass in the pancreatic head and uncinate process, FNA was consistent with adenocarcinoma.  The patient was started on systemic palliative therapy with gemcitabine and Abraxane on 04/27/2022, the patient had 3 staging CTs done on 07/13/2022, revealing decreased size of the pancreatic head mass, several new hypodense lesions in the liver, according to the patient, after review of her scans it was not felt that she had disease progression, gemcitabine and Abraxane were continued.  The patient had relocated to [facility] to be with her family, she had lost about 75 pounds, she has nausea, no abdominal pain at this time.  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    The patient had the biliary stent exchange on 09/28/2022 by Dr. Luz Brazen.  Chemotherapy was resumed on 10/01/2022.    The patient was admitted to the hospital with COVID, she had pancytopenia, requiring blood product transfusion, she is currently in rehab, still feeling tired.    Review of Systems;  CONSTITUTIONAL: No fever, chills.  Decreased appetite and energy level.  Positive for weight loss  ENMT: Eyes: No diplopia; Nose: No epistaxis. Mouth: No sore throat.  RESPIRATORY: No hemoptysis, shortness of breath, cough.   CARDIOVASCULAR: No chest pain, palpitations.  GASTROINTESTINAL: pos for nausea, no vomiting or abdominal pain, pos for constipation.  GENITOURINARY: No dysuria, urinary frequency, hematuria.  NEURO: No syncope, presyncope, headache.  Remainder:  ROS NEGATIVE    Past Medical History:      Diagnosis Date    Diabetes mellitus ([place])     Endometrial cancer ([place])     Hyperlipidemia     Hypertension     Pancreatic cancer [facility])      Patient Active Problem List   Diagnosis    Malignant neoplasm of pancreas (HCC)    Biliary stricture    Neutropenia (HCC)    Shortness of breath    Hypokalemia    COVID-19 virus infection    Type 2 diabetes mellitus with hyperglycemia, without long-term current use of insulin (HCC)    Dyspnea on exertion        Past Surgical History:  Procedure Laterality Date    CHOLECYSTECTOMY      COLONOSCOPY      ERCP N/A 09/28/2022    ERCP STENT REMOVAL/EXCHANGE performed by Georgeann Oppenheim, MD at [facility] ENDOSCOPY    ERCP N/A 09/28/2022    ERCP STONE REMOVAL performed by Georgeann Oppenheim, MD at [facility], TOTAL ABDOMINAL (CERVIX REMOVED)      SPINE SURGERY      Plate placed in back    UPPER GASTROINTESTINAL ENDOSCOPY         Family History:  Family History   Problem Relation Age of Onset    Breast Cancer Sister 64    Cancer Sister 32        throat    Cancer Maternal Grandmother 73        unknown  type       Medications:  Reviewed and reconciled.    Social History:  Social History     Socioeconomic History    Marital status: Unknown     Spouse name: Not on file    Number of children: Not on file    Years of education: Not on file    Highest education level: Not on file   Occupational History    Not on file   Tobacco Use    Smoking status: Never    Smokeless tobacco: Never   Vaping Use    Vaping Use: Never used   Substance and Sexual Activity    Alcohol use: Yes     Comment: occassionaly    Drug use: Never    Sexual activity: Not on file   Other Topics Concern    Not on file   Social History Narrative    Not on file     Social Determinants of Health     Financial Resource Strain: Not on file   Food Insecurity: No Food Insecurity (10/15/2022)    Hunger Vital Sign     Worried About Running Out of Food in the Last Year: Never true     Ran Out of Food in the Last Year: Never true   Transportation Needs: No Transportation Needs (10/15/2022)    PRAPARE - Armed forces logistics/support/administrative officer (Medical): No     Lack of Transportation (Non-Medical): No   Physical Activity: Not on file   Stress: Not on file   Social Connections: Not on file   Intimate Partner Violence: Not on file   Housing Stability: Low Risk  (10/15/2022)    Housing Stability Vital Sign     Unable to Pay for Housing in the Last Year: No     Number of Places Lived in the Last Year: 1     Unstable Housing in the Last Year: No       Allergies:  Allergies   Allergen Reactions    Molds & Smuts     Nicotine     Shellfish Allergy      Throat seemed to close       Physical Exam:  BP (!) 181/84 Comment: did not take bp meds, will notify dr  Pulse 83   Temp (!) 96.7 F (35.9 C)   Ht 1.676 m ('5\' 6"'$ )   Wt 81.1 kg (178 lb 14.4 oz)   SpO2 99%   BMI 28.88 kg/m     GENERAL: Alert, oriented x 3, not in acute distress  HEENT: PERRLA; EOMI. Oropharynx clear.   NECK: Supple. No palpable cervical  or supraclavicular lymphadenopathy.   LUNGS: Good air entry  bilaterally. No wheezing, crackles or rhonchi.   CARDIOVASCULAR: Regular rate. No murmurs, rubs or gallops.   ABDOMEN: Soft. Non-tender, non-distended. Positive bowel sounds.  EXTREMITIES: Without clubbing, cyanosis, or edema.   NEUROLOGIC: No focal deficits.   ECOG PS 1    Impression/Plan:     Gabriella Blake is a pleasant 82 year old lady, with a past medical history significant for uterine cancer, status post TAH, type II DM, hyperlipidemia, and hypertension, who was diagnosed with pancreatic cancer in June 2023, the patient received her care initially at [facility] health, CT scan of the abdomen and the pelvis from 03/22/2022 had revealed an ill-defined 3.5 x 2.5 cm hypodense mass and the uncinate process of the pancreas, hypodense focus in the inferior right lobe of the liver, she had an MRI done on 03/23/2022, revealing a hypoenhancing mass in the uncinate process/inferior head of the pancreas abutting the SMV with distortion of the SMV, less than 180 degree involvement by MR, pancreatic duct narrowing/obstruction, single hepatic lesion in the right hepatic lobe, small lymph node adjacent to the masslike area in the head of the pancreas, she had on 03/26/2022 and a US done, she was found to have a mass in the pancreatic head and uncinate process, FNA was consistent with adenocarcinoma.  The patient was started on systemic palliative therapy with gemcitabine and Abraxane on 04/27/2022, the patient had 3 staging CTs done on 07/13/2022, revealing decreased size of the pancreatic head mass, several new hypodense lesions in the liver, according to the patient, after review of her scans it was not felt that she had disease progression, gemcitabine and Abraxane were continued.  The patient had relocated to [place] to be with her family, she had lost about 75 pounds, she has nausea, no abdominal pain at this time.  The patient had bili obstruction and an ERCP done on 05/07/2022, with placement of covered metal stent, was found to  have ulcerative tumor invading the duodenum,  The patient had the biliary stent exchange on 09/28/2022 by Dr. Luz Brazen.      Records from [facility] were reviewed, discussed with the patient her diagnosis, and prognosis, recommended restating CT scans of the chest, abdomen, and pelvis, were done on 09/23/2022, which I had reviewed, she has a small 2 to 3 mm noncalcified nodules in the right upper and right mid lung, nonspecific, ill-defined fullness of the pancreatic head around 2 cm, hypoenhancing focus concerning for underlying malignancy, surrounding adenopathy without metastatic disease otherwise noted.  On 10/01/2022, chemotherapy was resumed, The patient was admitted to the hospital with COVID, she had pancytopenia, requiring blood product transfusion, hospitalization records reviewed, she is currently in rehab, still feeling tired, labs reviewed, white count had normalized 7.2, hemoglobin had improved to 9.5, platelet count had normalized, 283 K, will monitor her counts.  We decided to hold chemotherapy until she recovers from the hospitalization/COVID infection, will reevaluate in 2 weeks, she has constipation, prescribed MiraLAX.      RTC in 2 weeks.    Thank you for allowing [place] to participate in the care of Gabriella Blake.    Freda Munro, MD   HEMATOLOGY/MEDICAL [place]  [facility]  [place]  [place] [address]  Dept: [place]: [phone]

## 2022-11-01 ENCOUNTER — Ambulatory Visit: Admit: 2022-11-01 | Discharge: 2022-11-01 | Payer: MEDICARE | Attending: Registered Nurse

## 2022-11-01 DIAGNOSIS — C259 Malignant neoplasm of pancreas, unspecified: Secondary | ICD-10-CM

## 2022-11-01 DIAGNOSIS — G893 Neoplasm related pain (acute) (chronic): Secondary | ICD-10-CM

## 2022-11-01 DIAGNOSIS — R630 Anorexia: Secondary | ICD-10-CM | POA: Diagnosis not present

## 2022-11-01 DIAGNOSIS — Z515 Encounter for palliative care: Secondary | ICD-10-CM | POA: Diagnosis not present

## 2022-11-01 DIAGNOSIS — F32A Depression, unspecified: Secondary | ICD-10-CM | POA: Diagnosis not present

## 2022-11-01 DIAGNOSIS — R634 Abnormal weight loss: Secondary | ICD-10-CM | POA: Diagnosis not present

## 2022-11-01 DIAGNOSIS — Z6828 Body mass index (BMI) 28.0-28.9, adult: Secondary | ICD-10-CM | POA: Diagnosis not present

## 2022-11-01 NOTE — Progress Notes (Signed)
Palliative Care Department  Provider: Heloise Ochoa, APRN - CNP    Referring Provider:  Dr. Cline Crock    Location of Service:   [facility]    Chief Complaint: Gabriella Blake is a 82 y.o. female with chief complaint of pain, weight loss, depression    Assessment/Plan      Pancreatic Cancer:   -  Diagnosed in June 2023   -  Originally managed at [facility] [place]   -  Known to Dr. Ky Barban Amil locally   -  s/p ERCP with stent, stent exchaged 09/28/22   -  on chemotherapy, but held due to recent hospitalization/COVID    Pain related to Neoplasm:   -  Tramadol 50 mg PRN    Depression:   -  reactive and related to her recent move and need for rehab   -  She feels it will improve once she gets out of rehab and into her appartment    Decreased Appetite:   -  Needs ONS (Boost or Ensure) with each meal   -  Declines stimulant at this time    Follow Up:  6 weeks.  They were encouraged to call with any questions, concerns, needs, or changes in symptoms.    Subjective:     HPI:  Gabriella Blake is a 82 y.o. female with significant medical history of uterine cancer, status post TAH, type II DM, hyperlipidemia, and hypertension, who was diagnosed with pancreatic cancer in June 2023, with her original cancer care at [facility] in [place].  She had ERCP with stents placed in July 2023, and was started on systemic palliative therapy.  She moved to the [place] area and established with Dr. Cline Crock. She had stent exchanged on 09/28/22, and chemo resumed on 10/01/22.  She was referred to [facility] for support.    Subjective/Events/Discussions:  Gabriella Blake is seen today, unaccompanied.  She is alert, oriented and able to voice her needs and concerns well  She is familiar with Palliative care, stating she was followed by [facility] while in [place]  She unfortunately had COVID and hospitalization recently, and is now at [facility] was held while she recovers and  is to restart soon  She is supposed to transition to Assisted Living after rehab  She has abdominal pain, managed well with tramadol  She has depression, but relates this to still being in rehab and feels this will improve when she moves in to assisted living  Her appetite is reduced and she has lost weight  She declined an appetite stimulant at this time, state she just doesn't like the food at rehab  She had been using ONS previously but is not gettting them at rehab  I recommend she use an ONS with every meal to supplement her intake  She otherwise feels her symptoms are well managed at this time and no medications changes are needed at this time      Pain Assessment   Rating:  3  Description: pressure, stabbing, tight band, and constant  Duration: months  Frequency: daily  Location: upper abdomen and back  Alleviating Factors: relaxation, pain medication, lying down, and heating pad  Exacerbating Factors: food ingestion and taking her daily pills  Effect: change in function, interference with activities, sleep, and mood    Past Medical History:   Diagnosis Date    Diabetes mellitus ([place])     Endometrial cancer ([place])  Hyperlipidemia     Hypertension     Pancreatic cancer [facility])        Past Surgical History:   Procedure Laterality Date    CHOLECYSTECTOMY      COLONOSCOPY      ERCP N/A 09/28/2022    ERCP STENT REMOVAL/EXCHANGE performed by Georgeann Oppenheim, MD at [facility] ENDOSCOPY    ERCP N/A 09/28/2022    ERCP STONE REMOVAL performed by Georgeann Oppenheim, MD at [place], TOTAL ABDOMINAL (CERVIX REMOVED)      SPINE SURGERY      Plate placed in back    UPPER GASTROINTESTINAL ENDOSCOPY         Family History   Problem Relation Age of Onset    Breast Cancer Sister 64    Cancer Sister 33        throat    Cancer Maternal Grandmother 73        unknown type       ROS: UNLESS STATED ABOVE PATIENT DENIES:  CONSTITUTIONAL:  fever, chill, rigors, nausea, vomiting, fatigue.  HEENT: blurry vision, double vision,  hearing problem, tinnitus, hoarseness, dysphagia, odynophagia  RESPIRATORY: cough, shortness of breath, sputum expectoration.  CARDIOVASCULAR:  Chest pain/pressure, palpitation, syncope, irregular beats  GASTROINTESTINAL:  abdominal or rectal pain, diarrhea, constipation, .  GENITOURINARY:  Burning, frequency, urgency, incontinence, discharge  INTEGUMENTARY: rash, wound, pruritis  HEMATOLOGIC/LYMPHATIC:  Swelling, sores, gum bleeding, easy bruising, pica.  MUSCULOSKELETAL:  pain, edema, joint swelling or redness  NEUROLOGICAL:  light headed, dizziness, loss of consciousness, weakness, change in memory, seizures, tremors    Objective:     Physical Exam  BP (!) 152/82   Pulse 88   Temp 97.5 F (36.4 C)   Wt 79.8 kg (176 lb)   SpO2 97%   BMI 28.41 kg/m     Gen:  Alert, appears stated age, well nourished, in no acute distress  HEENT:  Normocephalic, conjunctiva pink, no drainage, mucosa moist  Neck:  Supple  Lungs:  CTA bilaterally, no audible rhonchi or wheezes noted  Heart::  RRR, no murmur, rub, or gallop noted during exam  Abd:  Soft, non tender, non distended, BS+  M/S/Ext:  Moving all extremities, no edema, pulses present  Skin:  Warm and dry  Neuro:  PERRL, Alert, oriented x 3; following commands    [place] Symptom Assessment Score       11/01/2022    12:00 PM   [place] Score   Pain Score 3   Tiredness Score 8   Nausea Score Not nauseated   Depression Score 6   Anxiety Score 3   Drowsiness Score 4   Appetite Score 2   Wellbeing Score 2   Dyspnea Score 1   Other Problem Score 3   Total Assessment Score(calculated) 32     Assessed by: patient and provider.    Current Medications:  Medications reviewed: yes    Controlled Substances Monitoring: [place]ARRS reviewed 11/01/22.      Heloise Ochoa, APRN - CNP  Palliative Medicine    MDM/Time:  The total encounter time on this service date was 60 minutes, which was spent performing a face-to-face encounter and personally completing the provider-level activities  documented in the note.  This includes time spent prior to the visit and after the visit in direct care of the patient.  This time does not include time spent in any separately reportable services.        Thank you  for allowing Palliative Medicine to participate in the care of Gabriella Blake.    Note: This report was completed using computerize voiced recognition software.  Every effort has been made to ensure accuracy; however, inadvertent computerized transcription errors may be present.

## 2022-11-03 DIAGNOSIS — C259 Malignant neoplasm of pancreas, unspecified: Secondary | ICD-10-CM | POA: Diagnosis not present

## 2022-11-03 DIAGNOSIS — E118 Type 2 diabetes mellitus with unspecified complications: Secondary | ICD-10-CM | POA: Diagnosis not present

## 2022-11-03 DIAGNOSIS — D709 Neutropenia, unspecified: Secondary | ICD-10-CM | POA: Diagnosis not present

## 2022-11-03 DIAGNOSIS — U071 COVID-19: Secondary | ICD-10-CM | POA: Diagnosis not present

## 2022-11-08 DIAGNOSIS — U071 COVID-19: Secondary | ICD-10-CM | POA: Diagnosis not present

## 2022-11-08 DIAGNOSIS — R262 Difficulty in walking, not elsewhere classified: Secondary | ICD-10-CM | POA: Diagnosis not present

## 2022-11-08 DIAGNOSIS — R41841 Cognitive communication deficit: Secondary | ICD-10-CM | POA: Diagnosis not present

## 2022-11-08 DIAGNOSIS — M6281 Muscle weakness (generalized): Secondary | ICD-10-CM | POA: Diagnosis not present

## 2022-11-08 NOTE — Telephone Encounter (Signed)
This nurse reached out to patient, per Dr Cline Crock, to see how patient is feeling. Patient has reached out to [place] this morning stating that she had emesis this weekend with stomach pain and weakness. Patient has difficulty talking, patient's son, Jeneen Rinks placed on phone for conversation. Jeneen Rinks states that patient is refusing to come in infusion for fluids and antiemetics stating that she is not nauseated anymore and can drink fluids. Patient is also refusing ER at this time but will reach out to palliative care for further pain issues.

## 2022-11-09 DIAGNOSIS — M6281 Muscle weakness (generalized): Secondary | ICD-10-CM | POA: Diagnosis not present

## 2022-11-09 DIAGNOSIS — U071 COVID-19: Secondary | ICD-10-CM | POA: Diagnosis not present

## 2022-11-09 DIAGNOSIS — R41841 Cognitive communication deficit: Secondary | ICD-10-CM | POA: Diagnosis not present

## 2022-11-09 DIAGNOSIS — R262 Difficulty in walking, not elsewhere classified: Secondary | ICD-10-CM | POA: Diagnosis not present

## 2022-11-10 DIAGNOSIS — M6281 Muscle weakness (generalized): Secondary | ICD-10-CM | POA: Diagnosis not present

## 2022-11-10 DIAGNOSIS — R41841 Cognitive communication deficit: Secondary | ICD-10-CM | POA: Diagnosis not present

## 2022-11-10 DIAGNOSIS — U071 COVID-19: Secondary | ICD-10-CM | POA: Diagnosis not present

## 2022-11-10 DIAGNOSIS — R262 Difficulty in walking, not elsewhere classified: Secondary | ICD-10-CM | POA: Diagnosis not present

## 2022-11-12 ENCOUNTER — Ambulatory Visit: Admit: 2022-11-12 | Discharge: 2022-11-12 | Payer: MEDICARE | Attending: Hematology & Oncology

## 2022-11-12 ENCOUNTER — Inpatient Hospital Stay: Admit: 2022-11-12 | Discharge: 2022-11-12 | Payer: MEDICARE

## 2022-11-12 DIAGNOSIS — C259 Malignant neoplasm of pancreas, unspecified: Secondary | ICD-10-CM

## 2022-11-12 DIAGNOSIS — Z5111 Encounter for antineoplastic chemotherapy: Secondary | ICD-10-CM

## 2022-11-12 DIAGNOSIS — E876 Hypokalemia: Secondary | ICD-10-CM | POA: Diagnosis not present

## 2022-11-12 LAB — COMPREHENSIVE METABOLIC PANEL
ALT: 44 U/L — ABNORMAL HIGH (ref 0–32)
AST: 92 U/L — ABNORMAL HIGH (ref 0–31)
Albumin: 2.6 g/dL — ABNORMAL LOW (ref 3.5–5.2)
Alkaline Phosphatase: 528 U/L — ABNORMAL HIGH (ref 35–104)
Anion Gap: 9 mmol/L (ref 7–16)
BUN: 7 mg/dL (ref 6–23)
CO2: 33 mmol/L — ABNORMAL HIGH (ref 22–29)
Calcium: 8.5 mg/dL — ABNORMAL LOW (ref 8.6–10.2)
Chloride: 89 mmol/L — ABNORMAL LOW (ref 98–107)
Creatinine: 0.5 mg/dL (ref 0.50–1.00)
Est, Glom Filt Rate: >60 mL/min/{1.73_m2} (ref >60)
Glucose: 164 mg/dL — ABNORMAL HIGH (ref 74–99)
Potassium: 2.8 mmol/L — ABNORMAL LOW (ref 3.5–5.0)
Sodium: 131 mmol/L — ABNORMAL LOW (ref 132–146)
Total Bilirubin: 1.3 mg/dL — ABNORMAL HIGH (ref 0.0–1.2)
Total Protein: 8.1 g/dL (ref 6.4–8.3)

## 2022-11-12 LAB — CBC WITH AUTO DIFFERENTIAL
Absolute Immature Granulocyte: 0.04 10*3/uL (ref 0.00–0.58)
Basophils %: 1 % (ref 0.0–2.0)
Basophils Absolute: 0.05 10*3/uL (ref 0.00–0.20)
Eosinophils %: 2 % (ref 0–6)
Eosinophils Absolute: 0.14 10*3/uL (ref 0.05–0.50)
Hematocrit: 30.4 % — ABNORMAL LOW (ref 34.0–48.0)
Hemoglobin: 9.9 g/dL — ABNORMAL LOW (ref 11.5–15.5)
Immature Granulocytes: 0 % (ref 0.0–5.0)
Lymphocytes %: 13 % — ABNORMAL LOW (ref 20.0–42.0)
Lymphocytes Absolute: 1.19 10*3/uL — ABNORMAL LOW (ref 1.50–4.00)
MCH: 28.3 pg (ref 26.0–35.0)
MCHC: 32.6 g/dL (ref 32.0–34.5)
MCV: 86.9 fL (ref 80.0–99.9)
MPV: 10.6 fL (ref 7.0–12.0)
Monocytes %: 15 % — ABNORMAL HIGH (ref 2.0–12.0)
Monocytes Absolute: 1.36 10*3/uL — ABNORMAL HIGH (ref 0.10–0.95)
Neutrophils %: 70 % (ref 43.0–80.0)
Neutrophils Absolute: 6.4 10*3/uL (ref 1.80–7.30)
Platelets: 236 10*3/uL (ref 130–450)
RBC: 3.5 m/uL (ref 3.50–5.50)
RDW: 16.4 % — ABNORMAL HIGH (ref 11.5–15.0)
WBC: 9.2 10*3/uL (ref 4.5–11.5)

## 2022-11-12 MED ORDER — POTASSIUM CHLORIDE CRYS ER 20 MEQ PO TBCR
20 MEQ | Freq: Once | ORAL | Status: AC
Start: 2022-11-12 — End: 2022-11-12
  Administered 2022-11-12: 17:00:00 20 meq via ORAL

## 2022-11-12 MED ORDER — ONDANSETRON HCL 4 MG/2ML IJ SOLN
4 MG/2ML | Freq: Once | INTRAMUSCULAR | Status: AC
Start: 2022-11-12 — End: 2022-11-12
  Administered 2022-11-12: 18:00:00 8 mg via INTRAVENOUS

## 2022-11-12 MED ORDER — PACLITAXEL PROTEIN-BOUND PART 100 MG IV SUSR
100 MG | Freq: Once | INTRAVENOUS | Status: AC
Start: 2022-11-12 — End: 2022-11-12
  Administered 2022-11-12: 18:00:00 190 mg via INTRAVENOUS

## 2022-11-12 MED ORDER — HEPARIN NA (PORK) LOCK FLSH PF 100 UNIT/ML IV SOLN
100 UNIT/ML | INTRAVENOUS | Status: DC | PRN
Start: 2022-11-12 — End: 2022-11-13
  Administered 2022-11-12: 19:00:00 500 [IU]

## 2022-11-12 MED ORDER — SODIUM CHLORIDE 0.9 % IV SOLN
0.9 % | INTRAVENOUS | Status: AC | PRN
Start: 2022-11-12 — End: 2022-11-13
  Administered 2022-11-12: 18:00:00 50 mL/h via INTRAVENOUS

## 2022-11-12 MED ORDER — SODIUM CHLORIDE 0.9 % IV SOLN
0.9 % | Freq: Once | INTRAVENOUS | Status: AC
Start: 2022-11-12 — End: 2022-11-12
  Administered 2022-11-12: 19:00:00 1522 mg/m2 via INTRAVENOUS

## 2022-11-12 MED ORDER — NORMAL SALINE FLUSH 0.9 % IV SOLN
0.9 % | INTRAVENOUS | Status: AC | PRN
Start: 2022-11-12 — End: 2022-11-13
  Administered 2022-11-12 (×2): 10 mL via INTRAVENOUS

## 2022-11-12 MED ORDER — POTASSIUM CHLORIDE IN NACL 20-0.9 MEQ/L-% IV SOLN
Freq: Once | INTRAVENOUS | Status: AC
Start: 2022-11-12 — End: 2022-11-12
  Administered 2022-11-12: 18:00:00 via INTRAVENOUS

## 2022-11-12 MED FILL — GEMCITABINE HCL 1 GM/26.3ML IV SOLN: 1 GM/26.3ML | INTRAVENOUS | Qty: 40.03

## 2022-11-12 MED FILL — POTASSIUM CHLORIDE IN NACL 20-0.9 MEQ/L-% IV SOLN: INTRAVENOUS | Qty: 1000

## 2022-11-12 MED FILL — ABRAXANE 100 MG IV SUSR: 100 MG | INTRAVENOUS | Qty: 38

## 2022-11-12 MED FILL — POTASSIUM CHLORIDE CRYS ER 20 MEQ PO TBCR: 20 MEQ | ORAL | Qty: 1

## 2022-11-12 MED FILL — ABRAXANE 100 MG IV SUSR: 100 MG | INTRAVENOUS | Qty: 40

## 2022-11-12 MED FILL — ONDANSETRON HCL 4 MG/2ML IJ SOLN: 4 MG/2ML | INTRAMUSCULAR | Qty: 4

## 2022-11-12 NOTE — Progress Notes (Signed)
Patient tolerated treatment and hydration well without complications or complaints. Alert and oriented x3. Patient aware of potential side effects and has no questions regarding treatment.  Pain assessed, patient denies any new or worsening pain. Pts B/P post tx 180/63 and 179/82-Pt reports she is due for a B/P med when she gets home-Pt asymptomatic-Dr El Amil notified of B/P post tx via TL RN [place] states its ok for pt to be discharged after reminding pt to take her B/P med as soon as she gets home and to follow up on Monday for repeat labwork-Pt notified of both requests and voiced understanding-Patient ate provided lunch and up to restroom x1 via w.c with minimal assist of one-Patient left via w.c with driver taking pt home

## 2022-11-12 NOTE — Progress Notes (Signed)
Patient's labs reviewed with Dr Ky Barban Amil. [place] for treatment. Orders placed for 1 L hydration with 97mq potassium over 2 hrs and oral potassium. Script sent to NH for oral potassium and cmp re-draw on 11/15/22. Wait for results.

## 2022-11-12 NOTE — Progress Notes (Signed)
[place]  [facility]  [place] [address]  Dept: [phone]  Loc: [phone]  Attending progress note      Reason for Visit: Pancreatic adenocarcinoma.    Referring Physician: Betsy Coder, MD [facility])    PCP:  No, Pcp    History of Present:     Gabriella Blake is a pleasant 82 year old lady, with a past medical history significant for uterine cancer, status post TAH, type II DM, hyperlipidemia, and hypertension, who was diagnosed with pancreatic cancer in June 2023, the patient received her care initially at [facility], CT scan of the abdomen and the pelvis from 03/22/2022 had revealed an ill-defined 3.5 x 2.5 cm hypodense mass and the uncinate process of the pancreas, hypodense focus in the inferior right lobe of the liver, she had an MRI done on 03/23/2022, revealing a hypoenhancing mass in the uncinate process/inferior head of the pancreas abutting the SMV with distortion of the SMV, less than 180 degree involvement by MR, pancreatic duct narrowing/obstruction, single hepatic lesion in the right hepatic lobe, small lymph node adjacent to the masslike area in the head of the pancreas, she had on 03/26/2022 and a US done, she was found to have a mass in the pancreatic head and uncinate process, FNA was consistent with adenocarcinoma.  The patient was started on systemic palliative therapy with gemcitabine and Abraxane on 04/27/2022, the patient had 3 staging CTs done on 07/13/2022, revealing decreased size of the pancreatic head mass, several new hypodense lesions in the liver, according to the patient, after review of her scans it was not felt that she had disease progression, gemcitabine and Abraxane were continued.  The patient had relocated to [facility] to be with her family, she had lost about 75 pounds, she has nausea, no abdominal pain at this time.  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    The patient had the biliary stent exchange on 09/28/2022 by Dr. Luz Brazen.  Chemotherapy was resumed on 10/01/2022.    The patient was admitted to the hospital with COVID, she had pancytopenia, requiring blood product transfusion.    Today 11/12/2022 chemotherapy will be resumed, she is feeling better, nausea had improved, no vomiting, no diarrhea.    Review of Systems;  CONSTITUTIONAL: No fever, chills.  Decreased appetite and energy level.  Positive for weight loss  ENMT: Eyes: No diplopia; Nose: No epistaxis. Mouth: No sore throat.  RESPIRATORY: No hemoptysis, shortness of breath, cough.   CARDIOVASCULAR: No chest pain, palpitations.  GASTROINTESTINAL: Nausea had improved, no vomiting or abdominal pain, pos for constipation.  GENITOURINARY: No dysuria, urinary frequency, hematuria.  NEURO: No syncope, presyncope, headache.  Remainder:  ROS NEGATIVE    Past Medical History:      Diagnosis Date    Diabetes mellitus ([place])     Endometrial cancer ([place])     Hyperlipidemia     Hypertension     Pancreatic cancer [facility])      Patient Active Problem List   Diagnosis    Malignant neoplasm of pancreas (HCC)    Biliary stricture    Neutropenia (HCC)    Shortness of breath    Hypokalemia    COVID-19 virus infection    Type 2 diabetes mellitus with hyperglycemia, without long-term current use of insulin (HCC)    Dyspnea on exertion  Past Surgical History:      Procedure Laterality Date    CHOLECYSTECTOMY      COLONOSCOPY      ERCP N/A 09/28/2022    ERCP STENT REMOVAL/EXCHANGE performed by Georgeann Oppenheim, MD at [facility] ENDOSCOPY    ERCP N/A 09/28/2022    ERCP STONE REMOVAL performed by Georgeann Oppenheim, MD at [place], TOTAL ABDOMINAL (CERVIX REMOVED)      SPINE SURGERY      Plate placed in back    UPPER GASTROINTESTINAL ENDOSCOPY         Family History:  Family History   Problem Relation Age of Onset    Breast Cancer Sister 33     Cancer Sister 12        throat    Cancer Maternal Grandmother 73        unknown type       Medications:  Reviewed and reconciled.    Social History:  Social History     Socioeconomic History    Marital status: Unknown     Spouse name: Not on file    Number of children: Not on file    Years of education: Not on file    Highest education level: Not on file   Occupational History    Not on file   Tobacco Use    Smoking status: Never    Smokeless tobacco: Never   Vaping Use    Vaping Use: Never used   Substance and Sexual Activity    Alcohol use: Yes     Comment: occassionaly    Drug use: Never    Sexual activity: Not on file   Other Topics Concern    Not on file   Social History Narrative    Not on file     Social Determinants of Health     Financial Resource Strain: Not on file   Food Insecurity: No Food Insecurity (10/15/2022)    Hunger Vital Sign     Worried About Running Out of Food in the Last Year: Never true     Ran Out of Food in the Last Year: Never true   Transportation Needs: No Transportation Needs (10/15/2022)    PRAPARE - Armed forces logistics/support/administrative officer (Medical): No     Lack of Transportation (Non-Medical): No   Physical Activity: Not on file   Stress: Not on file   Social Connections: Not on file   Intimate Partner Violence: Not on file   Housing Stability: Low Risk  (10/15/2022)    Housing Stability Vital Sign     Unable to Pay for Housing in the Last Year: No     Number of Places Lived in the Last Year: 1     Unstable Housing in the Last Year: No       Allergies:  Allergies   Allergen Reactions    Molds & Smuts     Nicotine     Shellfish Allergy      Throat seemed to close       Physical Exam:  BP 134/64   Pulse 75   Temp 97.8 F (36.6 C)   Ht 1.676 m ('5\' 6"'$ )   Wt 77.6 kg (171 lb)   SpO2 97%   BMI 27.60 kg/m     GENERAL: Alert, oriented x 3, not in acute distress  HEENT: PERRLA; EOMI. Oropharynx clear.   NECK: Supple. No palpable cervical or supraclavicular lymphadenopathy.  LUNGS: Good  air entry bilaterally. No wheezing, crackles or rhonchi.   CARDIOVASCULAR: Regular rate. No murmurs, rubs or gallops.   ABDOMEN: Soft. Non-tender, non-distended. Positive bowel sounds.  EXTREMITIES: Without clubbing, cyanosis, or edema.   NEUROLOGIC: No focal deficits.   ECOG PS 1    Impression/Plan:     Gabriella Blake is a pleasant 82 year old lady, with a past medical history significant for uterine cancer, status post TAH, type II DM, hyperlipidemia, and hypertension, who was diagnosed with pancreatic cancer in June 2023, the patient received her care initially at [facility] health, CT scan of the abdomen and the pelvis from 03/22/2022 had revealed an ill-defined 3.5 x 2.5 cm hypodense mass and the uncinate process of the pancreas, hypodense focus in the inferior right lobe of the liver, she had an MRI done on 03/23/2022, revealing a hypoenhancing mass in the uncinate process/inferior head of the pancreas abutting the SMV with distortion of the SMV, less than 180 degree involvement by MR, pancreatic duct narrowing/obstruction, single hepatic lesion in the right hepatic lobe, small lymph node adjacent to the masslike area in the head of the pancreas, she had on 03/26/2022 and a US done, she was found to have a mass in the pancreatic head and uncinate process, FNA was consistent with adenocarcinoma.  The patient was started on systemic palliative therapy with gemcitabine and Abraxane on 04/27/2022, the patient had 3 staging CTs done on 07/13/2022, revealing decreased size of the pancreatic head mass, several new hypodense lesions in the liver, according to the patient, after review of her scans it was not felt that she had disease progression, gemcitabine and Abraxane were continued.  The patient had relocated to [facility] to be with her family, she had lost about 75 pounds, she has nausea, no abdominal pain at this time.  The patient had bili obstruction and an ERCP done on 05/07/2022, with placement of covered metal stent, was  found to have ulcerative tumor invading the duodenum,  The patient had the biliary stent exchange on 09/28/2022 by Dr. Luz Brazen.      Records from [facility] were reviewed, discussed with the patient her diagnosis, and prognosis, recommended restating CT scans of the chest, abdomen, and pelvis, were done on 09/23/2022, which I had reviewed, she has a small 2 to 3 mm noncalcified nodules in the right upper and right mid lung, nonspecific, ill-defined fullness of the pancreatic head around 2 cm, hypoenhancing focus concerning for underlying malignancy, surrounding adenopathy without metastatic disease otherwise noted.  On 10/01/2022, chemotherapy was resumed, The patient was admitted to the hospital with COVID, she had pancytopenia, requiring blood product transfusion, hospitalization records reviewed, today 11/12/2022 chemotherapy will be resumed.    The patient is doing well clinically, labs reviewed, she has hypokalemia, potassium is 2.8, will replete the potassium with intravenous and oral KCl, sodium is 130, she will receive intravenous hydration, bilirubin has slightly increased to 1.3, will repeat her labs on Monday, replete electrolytes as needed, if she has worsening hyperbilirubinemia will update Dr. Luz Brazen, proceed with gemcitabine and Abraxane, will change the schedule to every other week for better tolerance.      CMP on Monday.  RTC in 2 weeks.    Thank you for allowing [place] to participate in the care of Mrs. Yogi.    Freda Munro, MD   HEMATOLOGY/MEDICAL [place]  [facility]  [place]  [place] [address]  Dept: [phone]  Loc: [phone]

## 2022-11-12 NOTE — Progress Notes (Signed)
Gabriella Blake  11/12/2022  Ht Readings from Last 1 Encounters:   11/12/22 1.676 m ('5\' 6"'$ )     Wt Readings from Last 10 Encounters:   11/12/22 77.6 kg (171 lb)   11/01/22 79.8 kg (176 lb)   10/29/22 81.1 kg (178 lb 14.4 oz)   10/14/22 85.7 kg (189 lb)   10/12/22 85.7 kg (189 lb)   10/08/22 85.7 kg (189 lb)   10/01/22 86.5 kg (190 lb 12.8 oz)   09/28/22 88 kg (194 lb)   09/17/22 88.4 kg (194 lb 14.4 oz)     Body mass index is 27.6 kg/m.    Assessment: Met w/ pt during infusion per referral for wt loss s/p recent hospitalization. Pt receiving tx for pancreatic CA. Pt is now home w/ plans for in home assistance starting on 2/5. Pt reports decreased appetite w/ altered taste perception. She has been drinking Boost Original 1x daily. Discussed overall nutrition needs, encouraging smaller more frequent meals. Provided pt w/ variety of resources to optimize nutritional intake, encouraging protein at each meal/snack. Discussed ONS use, recommending Plus versions of shakes, drinking at least BID. Provided w/ samples of Ensure Complete and Boost Plus for trial. Pt appreciative of assistance. Encouraged to contact this clinician as needed. Will follow PRN.    Weight change: -23# x2 months (11.9%)  Appetite: poor  Nutritional Side Effects: early satiety, anorexia, altered taste perception  Calculated Needs: 25-28 kcal/kg CBW = 19-2100 kcal, 1.3-1.5 gm/kg IBW = 80-90 gm pro, 1 ml/kcal = 19-2100 ml fluids  Malnutrition Status: Severe  Nutrition Diagnosis: Severe malnutrition r/t pancreatic CA AEB moderate fat and muscle loss, 11.9% wt loss x 2 months, <75% nutrient intake x >1 month.    Recommendations: Pt to consume at least 6 small meals/day + ONS of choice BID    Colonel Bald, MS, RD, LD

## 2022-11-12 NOTE — Progress Notes (Signed)
Patient provided with discharge instructions.  All questions answered.  Patient understands follow up plan of care.

## 2022-11-12 NOTE — Telephone Encounter (Signed)
Met with patient during her infusion appointment today.  Patient states treatment has been rough and that she was recently in the hospital and it is taking time to get back to her normal self.  She has a very weak voice.  She states that she is tired, is still able to care for her self but sleeps and rests more.  She does acknowledge that he son has arranged help for her at her house and that that person will start on Monday and that he has also made arrangements for her transportation.  Patient states that she is starting to eat better , encouraged patient that she could call in to set up hydration if she ever again isn't eating or drinking well.  Patient acknowledges information.  Encouragement and support given and patient states that she is taking things one thing at a time.  Patient denies any questions or issues at this time and was encouraged to call if any questions or issues arise

## 2022-11-13 LAB — CANCER ANTIGEN 19-9: CA 19-9: 2724 U/mL — ABNORMAL HIGH (ref 0–35)

## 2022-11-15 DIAGNOSIS — C259 Malignant neoplasm of pancreas, unspecified: Secondary | ICD-10-CM

## 2022-11-15 DIAGNOSIS — U071 COVID-19: Secondary | ICD-10-CM | POA: Diagnosis not present

## 2022-11-15 DIAGNOSIS — R262 Difficulty in walking, not elsewhere classified: Secondary | ICD-10-CM | POA: Diagnosis not present

## 2022-11-15 DIAGNOSIS — R41841 Cognitive communication deficit: Secondary | ICD-10-CM | POA: Diagnosis not present

## 2022-11-15 DIAGNOSIS — M6281 Muscle weakness (generalized): Secondary | ICD-10-CM | POA: Diagnosis not present

## 2022-11-15 MED ORDER — HEPARIN NA (PORK) LOCK FLSH PF 100 UNIT/ML IV SOLN
100 UNIT/ML | INTRAVENOUS | Status: DC | PRN
Start: 2022-11-15 — End: 2022-11-16

## 2022-11-15 MED ORDER — NORMAL SALINE FLUSH 0.9 % IV SOLN
0.9 % | INTRAVENOUS | Status: DC | PRN
Start: 2022-11-15 — End: 2022-11-16

## 2022-11-15 NOTE — Telephone Encounter (Signed)
Message left @ 09:30 am. by a another person other than patient to cancel labs / possible infusion scheduled @ 09:15 am. 11/15/22.  This person did not leave a return phone number, just stated she will call back.  Notified [place] for Dr. Ky Barban Amil via in basket messaging system in epic.

## 2022-11-16 ENCOUNTER — Inpatient Hospital Stay: Payer: MEDICARE

## 2022-11-17 DIAGNOSIS — M6281 Muscle weakness (generalized): Secondary | ICD-10-CM | POA: Diagnosis not present

## 2022-11-17 DIAGNOSIS — U071 COVID-19: Secondary | ICD-10-CM | POA: Diagnosis not present

## 2022-11-17 DIAGNOSIS — R41841 Cognitive communication deficit: Secondary | ICD-10-CM | POA: Diagnosis not present

## 2022-11-17 DIAGNOSIS — R262 Difficulty in walking, not elsewhere classified: Secondary | ICD-10-CM | POA: Diagnosis not present

## 2022-11-18 ENCOUNTER — Inpatient Hospital Stay
Admit: 2022-11-18 | Discharge: 2022-11-18 | Disposition: A | Discharge status: Home / Self Care | Payer: MEDICARE | Attending: Emergency Medicine

## 2022-11-18 DIAGNOSIS — D61818 Other pancytopenia: Secondary | ICD-10-CM

## 2022-11-18 DIAGNOSIS — C259 Malignant neoplasm of pancreas, unspecified: Secondary | ICD-10-CM

## 2022-11-18 DIAGNOSIS — Z452 Encounter for adjustment and management of vascular access device: Secondary | ICD-10-CM

## 2022-11-18 DIAGNOSIS — K625 Hemorrhage of anus and rectum: Secondary | ICD-10-CM

## 2022-11-18 DIAGNOSIS — R531 Weakness: Secondary | ICD-10-CM | POA: Diagnosis not present

## 2022-11-18 DIAGNOSIS — Z0389 Encounter for observation for other suspected diseases and conditions ruled out: Secondary | ICD-10-CM | POA: Diagnosis not present

## 2022-11-18 LAB — COMPREHENSIVE METABOLIC PANEL
ALT: 47 U/L — ABNORMAL HIGH (ref 0–32)
ALT: 42 U/L — ABNORMAL HIGH (ref 0–32)
AST: 89 U/L — ABNORMAL HIGH (ref 0–31)
AST: 71 U/L — ABNORMAL HIGH (ref 0–31)
Albumin: 2 g/dL — ABNORMAL LOW (ref 3.5–5.2)
Albumin: 2.2 g/dL — ABNORMAL LOW (ref 3.5–5.2)
Alkaline Phosphatase: 382 U/L — ABNORMAL HIGH (ref 35–104)
Alkaline Phosphatase: 363 U/L — ABNORMAL HIGH (ref 35–104)
Anion Gap: 7 mmol/L (ref 7–16)
Anion Gap: 6 mmol/L — ABNORMAL LOW (ref 7–16)
BUN: 12 mg/dL (ref 6–23)
BUN: 11 mg/dL (ref 6–23)
CO2: 31 mmol/L — ABNORMAL HIGH (ref 22–29)
CO2: 31 mmol/L — ABNORMAL HIGH (ref 22–29)
Calcium: 8.5 mg/dL — ABNORMAL LOW (ref 8.6–10.2)
Calcium: 8.4 mg/dL — ABNORMAL LOW (ref 8.6–10.2)
Chloride: 96 mmol/L — ABNORMAL LOW (ref 98–107)
Chloride: 99 mmol/L (ref 98–107)
Creatinine: 0.4 mg/dL — ABNORMAL LOW (ref 0.50–1.00)
Creatinine: 0.5 mg/dL (ref 0.50–1.00)
Est, Glom Filt Rate: >60 mL/min/{1.73_m2} (ref >60)
Est, Glom Filt Rate: >60 mL/min/{1.73_m2} (ref >60)
Glucose: 288 mg/dL — ABNORMAL HIGH (ref 74–99)
Glucose: 256 mg/dL — ABNORMAL HIGH (ref 74–99)
Potassium: 4.1 mmol/L (ref 3.5–5.0)
Potassium: 4.1 mmol/L (ref 3.5–5.0)
Sodium: 134 mmol/L (ref 132–146)
Sodium: 136 mmol/L (ref 132–146)
Total Bilirubin: 1 mg/dL (ref 0.0–1.2)
Total Bilirubin: 0.9 mg/dL (ref 0.0–1.2)
Total Protein: 7 g/dL (ref 6.4–8.3)
Total Protein: 6.7 g/dL (ref 6.4–8.3)

## 2022-11-18 LAB — CBC WITH AUTO DIFFERENTIAL
Basophils %: 2 % (ref 0.0–2.0)
Basophils %: 0 % (ref 0.0–2.0)
Basophils Absolute: 0.04 10*3/uL (ref 0.00–0.20)
Basophils Absolute: 0 10*3/uL (ref 0.00–0.20)
Eosinophils %: 0 % (ref 0–6)
Eosinophils %: 4 % (ref 0–6)
Eosinophils Absolute: 0 10*3/uL — ABNORMAL LOW (ref 0.05–0.50)
Eosinophils Absolute: 0.07 10*3/uL (ref 0.05–0.50)
Hematocrit: 24.6 % — ABNORMAL LOW (ref 34.0–48.0)
Hematocrit: 24.2 % — ABNORMAL LOW (ref 34.0–48.0)
Hemoglobin: 8 g/dL — ABNORMAL LOW (ref 11.5–15.5)
Hemoglobin: 8 g/dL — ABNORMAL LOW (ref 11.5–15.5)
Lymphocytes %: 9 % — ABNORMAL LOW (ref 20.0–42.0)
Lymphocytes %: 8 % — ABNORMAL LOW (ref 20.0–42.0)
Lymphocytes Absolute: 0.18 10*3/uL — ABNORMAL LOW (ref 1.50–4.00)
Lymphocytes Absolute: 0.16 10*3/uL — ABNORMAL LOW (ref 1.50–4.00)
MCH: 27.8 pg (ref 26.0–35.0)
MCH: 28.4 pg (ref 26.0–35.0)
MCHC: 32.5 g/dL (ref 32.0–34.5)
MCHC: 33.1 g/dL (ref 32.0–34.5)
MCV: 85.4 fL (ref 80.0–99.9)
MCV: 85.8 fL (ref 80.0–99.9)
MPV: 11.5 fL (ref 7.0–12.0)
MPV: 10.7 fL (ref 7.0–12.0)
Monocytes %: 3 % (ref 2.0–12.0)
Monocytes %: 4 % (ref 2.0–12.0)
Monocytes Absolute: 0.05 10*3/uL — ABNORMAL LOW (ref 0.10–0.95)
Monocytes Absolute: 0.07 10*3/uL — ABNORMAL LOW (ref 0.10–0.95)
Neutrophils %: 87 % — ABNORMAL HIGH (ref 43.0–80.0)
Neutrophils %: 85 % — ABNORMAL HIGH (ref 43.0–80.0)
Neutrophils Absolute: 1.74 10*3/uL — ABNORMAL LOW (ref 1.80–7.30)
Neutrophils Absolute: 1.79 10*3/uL — ABNORMAL LOW (ref 1.80–7.30)
Platelets: 75 10*3/uL — ABNORMAL LOW (ref 130–450)
Platelets: 67 10*3/uL — ABNORMAL LOW (ref 130–450)
RBC: 2.88 m/uL — ABNORMAL LOW (ref 3.50–5.50)
RBC: 2.82 m/uL — ABNORMAL LOW (ref 3.50–5.50)
RDW: 15.9 % — ABNORMAL HIGH (ref 11.5–15.0)
RDW: 15.9 % — ABNORMAL HIGH (ref 11.5–15.0)
WBC: 2 10*3/uL — ABNORMAL LOW (ref 4.5–11.5)
WBC: 2.1 10*3/uL — ABNORMAL LOW (ref 4.5–11.5)

## 2022-11-18 LAB — PROTIME-INR
INR: 1.5
Protime: 15.9 s — ABNORMAL HIGH (ref 9.3–12.4)

## 2022-11-18 LAB — PLATELET CONFIRMATION

## 2022-11-18 LAB — EKG 12-LEAD
Atrial Rate: 69 {beats}/min
P Axis: 34 degrees
P-R Interval: 196 ms
Q-T Interval: 424 ms
QRS Duration: 90 ms
QTc Calculation (Bazett): 454 ms
R Axis: -10 degrees
T Axis: 4 degrees
Ventricular Rate: 69 {beats}/min

## 2022-11-18 LAB — APTT: APTT: 30.5 s (ref 24.5–35.1)

## 2022-11-18 LAB — TYPE AND SCREEN
ABO/Rh: B POS
Antibody Screen: NEGATIVE

## 2022-11-18 LAB — LACTIC ACID: Lactic Acid: 0.9 mmol/L (ref 0.5–2.2)

## 2022-11-18 MED ORDER — NORMAL SALINE FLUSH 0.9 % IV SOLN
0.9 | INTRAVENOUS | Status: DC | PRN
Start: 2022-11-18 — End: 2022-11-19

## 2022-11-18 MED ORDER — SODIUM CHLORIDE 0.9 % IV BOLUS
0.9 | Freq: Once | INTRAVENOUS | Status: AC
Start: 2022-11-18 — End: 2022-11-18
  Administered 2022-11-18: 15:00:00 1000 mL via INTRAVENOUS

## 2022-11-18 MED ORDER — HEPARIN NA (PORK) LOCK FLSH PF 100 UNIT/ML IV SOLN
100 | Freq: Once | INTRAVENOUS | Status: AC
Start: 2022-11-18 — End: 2022-11-18
  Administered 2022-11-18: 21:00:00 500 [IU]

## 2022-11-18 MED ORDER — HEPARIN NA (PORK) LOCK FLSH PF 100 UNIT/ML IV SOLN
100 | INTRAVENOUS | Status: DC | PRN
Start: 2022-11-18 — End: 2022-11-19

## 2022-11-18 MED FILL — HEPARIN NA (PORK) LOCK FLSH PF 100 UNIT/ML IV SOLN: 100 UNIT/ML | INTRAVENOUS | Qty: 5

## 2022-11-18 NOTE — Discharge Instructions (Signed)
Return to the emergency department for new or worsening symptoms.

## 2022-11-18 NOTE — ED Notes (Signed)
Patient refusing all blood work at this time. Resident going into talk to PT.

## 2022-11-18 NOTE — Progress Notes (Signed)
Patient tolerated infusion well. Patient alert and oriented x 3 No distress noted. Vital signs stable.  As pt was being d/c'd after hydration bolus,her son stated that the caregiver saw large blood clots in pts stool this am.  Pt sent to ER for eval and treat for poss GI bleed issues. Report called to [facility], Agricultural consultant in ER.

## 2022-11-18 NOTE — ED Provider Notes (Cosign Needed)
[facility] EMERGENCY DEPARTMENT  EMERGENCY DEPARTMENT ENCOUNTER        Pt Name: Gabriella Blake  MRN: [id number]  Birthdate July 24, 1941  Date of evaluation: 11/18/2022  Provider: Beckie Busing, DO  PCP: No, Pcp  Note Started: 12:46 PM EST 11/18/22    CHIEF COMPLAINT       Chief Complaint   Patient presents with    Rectal Bleeding     Reports rusty color stools. Reports general weakness. Pale in color.     Extremity Weakness       HISTORY OF PRESENT ILLNESS: 1 or more Elements   History From: Patient and son    Limitations to history : None    Gabriella Blake is a 82 y.o. female who presents to the emergency department for complaint of rectal bleeding and generalized weakness.  Patient has history of pancreatic cancer and follow-up with oncology today and was advised to go to the emergency department for further evaluation and management.  She states she lives at home with her spouse and has a home aide who assists during the days.  The home aide noted bloody stool yesterday.  Patient did not see the stool.  She denies any previous episodes.  She reports ongoing weakness since she had COVID 1 month ago.  She denies any abdominal pain, vomiting, fevers, chills, chest pain, shortness of breath.  Patient does report poor appetite.    Nursing Notes were all reviewed and agreed with or any disagreements were addressed in the HPI.      REVIEW OF EXTERNAL NOTE :       Office visit with oncology 11/12/2022 reviewed.  Patient seen for follow-up of malignant neoplasm of the pancreas.  Patient with poor appetite at that time.    REVIEW OF SYSTEMS :           Positives and Pertinent negatives as per HPI.     SURGICAL HISTORY     Past Surgical History:   Procedure Laterality Date    CHOLECYSTECTOMY      COLONOSCOPY      ERCP N/A 09/28/2022    ERCP STENT REMOVAL/EXCHANGE performed by Deborah Chalk, MD at [facility] ENDOSCOPY    ERCP N/A 09/28/2022    ERCP STONE REMOVAL performed by Deborah Chalk, MD at  [facility] ENDOSCOPY    HYSTERECTOMY, TOTAL ABDOMINAL (CERVIX REMOVED)      SPINE SURGERY      Plate placed in back    UPPER GASTROINTESTINAL ENDOSCOPY         CURRENTMEDICATIONS       Discharge Medication List as of 11/18/2022  3:24 PM        CONTINUE these medications which have NOT CHANGED    Details   polyethylene glycol (GLYCOLAX) 17 GM/SCOOP powder Take 17 g by mouth daily as needed (constipation), Disp-1530 g, R-0Print      prochlorperazine (COMPAZINE) 10 MG tablet Take 1 tablet by mouth every 6 hours as needed (n/v), Disp-60 tablet, R-1Normal      ondansetron (ZOFRAN) 4 MG tablet Take 2 tablets by mouth every 8 hours as needed for Nausea or Vomiting, Disp-60 tablet, R-1Normal      potassium chloride (KLOR-CON M) 20 MEQ extended release tablet Take 1 tablet by mouth 2 times daily, Disp-28 tablet, R-0Normal      carvedilol (COREG) 25 MG tablet Take 1 tablet by mouth 2 times dailyHistorical Med      CREON 36000-114000 units CPEP delayed release  capsule Take 1 capsule by mouth 3 times daily (with meals), DAWHistorical Med      glipiZIDE (GLUCOTROL) 10 MG tablet Take 1 tablet by mouth 2 times daily (before meals)Historical Med      traMADol (ULTRAM) 50 MG tablet Take 1 tablet by mouth every 8 hours as needed for Pain.Historical Med      lisinopril (PRINIVIL;ZESTRIL) 20 MG tablet Take 1 tablet by mouth dailyHistorical Med      magnesium 30 MG tablet Take 1 tablet by mouth dailyHistorical Med      b complex vitamins capsule Take 1 capsule by mouth dailyHistorical Med      Calcium Carb-Vit D-C-E-Mineral (OS-CAL ULTRA) 600 MG TABS Take 600 mg by mouth dailyHistorical Med      Cholecalciferol (VITAMIN D) 50 MCG (2000 UT) CAPS capsule Take 2,000 Units by mouth dailyHistorical Med             ALLERGIES     Molds & smuts, Nicotine, and Shellfish allergy    FAMILYHISTORY       Family History   Problem Relation Age of Onset    Breast Cancer Sister 96    Cancer Sister 11        throat    Cancer Maternal Grandmother 53         unknown type        SOCIAL HISTORY       Social History     Tobacco Use    Smoking status: Never    Smokeless tobacco: Never   Vaping Use    Vaping Use: Never used   Substance Use Topics    Alcohol use: Yes     Comment: occassionaly    Drug use: Never       SCREENINGS   NIH Stroke Scale  NIH Stroke Scale Assessed: No    Glasgow Coma Scale  Eye Opening: Spontaneous  Best Verbal Response: Oriented  Best Motor Response: Obeys commands  Glasgow Coma Scale Score: 15                CIWA Assessment  BP: (!) 163/79  Pulse: 71           PHYSICAL EXAM  1 or more Elements     ED Triage Vitals   BP Temp Temp Source Pulse Respirations SpO2 Height Weight   11/18/22 1154 11/18/22 1133 11/18/22 1133 11/18/22 1133 11/18/22 1154 11/18/22 1133 -- --   131/73 97.1 F (36.2 C) Temporal 69 16 100 %            Constitutional/General: Alert and oriented x3, illappearing, non toxic in NAD  Head: NC/AT  Eyes:  EOMI  Mouth: Oropharynx clear, handling secretions, no trismus  Neck: Supple, full ROM  Pulmonary: Lungs clear to auscultation bilaterally, no wheezes, rales, or rhonchi. Not in respiratory distress  Chest: Chest wall port in place with no surrounding erythema  Cardiovascular:  Regular rate and rhythm. 2+ distal pulses  Abdomen: Soft, non tender, non distended,   Extremities: Moves all extremities x 4. Warm and well perfused  Skin: Pale, no rashes  Neurologic: GCS 15, slowed speech  Psych: Normal Affect          DIAGNOSTIC RESULTS   LABS:    Labs Reviewed   CBC WITH AUTO DIFFERENTIAL - Abnormal; Notable for the following components:       Result Value    WBC 2.1 (*)     RBC 2.82 (*)     Hemoglobin 8.0 (*)  Hematocrit 24.2 (*)     RDW 15.9 (*)     Platelets 67 (*)     Neutrophils % 85 (*)     Lymphocytes % 8 (*)     Neutrophils Absolute 1.79 (*)     Lymphocytes Absolute 0.16 (*)     Monocytes Absolute 0.07 (*)     All other components within normal limits   COMPREHENSIVE METABOLIC PANEL - Abnormal; Notable for the following  components:    CO2 31 (*)     Anion Gap 6 (*)     Glucose 256 (*)     Calcium 8.4 (*)     Albumin 2.2 (*)     Alkaline Phosphatase 363 (*)     ALT 42 (*)     AST 71 (*)     All other components within normal limits   PROTIME-INR - Abnormal; Notable for the following components:    Protime 15.9 (*)     All other components within normal limits   LACTIC ACID   APTT   PLATELET CONFIRMATION   TYPE AND SCREEN       As interpreted by me, the above displayed labs are abnormal. All other labs obtained during this visit were within normal range or not returned as of this dictation.        RADIOLOGY:   Non-plain film images such as CT, Ultrasound and MRI are read by the radiologist. Plain radiographic images are visualized and preliminarily interpreted by the ED Provider in ed course.    Interpretation per the Radiologist below, if available at the time of this note:    No orders to display     No results found.    No results found.    PROCEDURES   Unless otherwise noted below, none          PAST MEDICAL HISTORY/Chronic Conditions Affecting Care      has a past medical history of Diabetes mellitus (HCC), Endometrial cancer (HCC), Hyperlipidemia, Hypertension, and Pancreatic cancer (HCC).     EMERGENCY DEPARTMENT COURSE    Vitals:    Vitals:    11/18/22 1154 11/18/22 1426 11/18/22 1427 11/18/22 1501   BP: 131/73 127/82  (!) 163/79   Pulse:  59  71   Resp: 16 20  20    Temp:  97.2 F (36.2 C)     TempSrc:  Oral     SpO2:  100%  100%   Weight:   77.6 kg (171 lb)    Height:   1.676 m (5\' 6" )        Patient was given the following medications:  Medications   heparin (PF) 100 UNIT/ML injection 500 Units (500 Units IntraCATHeter Given 11/18/22 1549)           Is this patient to be included in the SEP-1 Core Measure due to severe sepsis or septic shock?   No   Exclusion criteria - the patient is NOT to be included for SEP-1 Core Measure due to:  Infection is not suspected        Medical Decision Making/Differential Diagnosis:    CC/HPI  Summary, Social Determinants of health, Records Reviewed, DDx, testing done/not done, ED Course, Reassessment, disposition considerations/shared decision making with patient, consults, disposition:      ED Course as of 11/18/22 1613   Thu Nov 18, 2022   1248 EKG:  This EKG is signed and interpreted by me, Dr. Nicoletta Ba, MD    Rate: 69  Rhythm:  Sinus  Interpretation: Normal sinus rhythm, normal PR interval, normal QRS, normal axis, normal QT interval, no acute ST or T wave changes, LVH  Comparison: stable as compared to patient's most recent EKG   [JA]   1342 Rectal exam with female chaperone present reveals nonthrombosed external hemorrhoids, no fissures or bleeding hemorrhoids, no palpable masses, brown stool [TO]   1546 Extensive conversation with patient and family member at bedside regarding goals of care. Patient does not want admission or invasive work up or treatment. She would like her code status to be changed to [facility]. She wants to be discharged home. She understands risks of hemorrhage associated with GI bleed, and she is willing to accept that risk. She will return to the ED if she would like to pursue further treatment. [JA]      ED Course User Index  [JA] Awad-Spirtos, Yehuda Mao, MD  [TO] Marianna Fuss, DO        Medical Decision Making  Amount and/or Complexity of Data Reviewed  Labs: ordered.  ECG/medicine tests: ordered.    Risk  Prescription drug management.        Patient is a 82 y.o. female who presents for generalized weakness and report of rectal bleeding.  Patient hemodynamically stable on arrival, however pale and ill-appearing.  Patient with a benign abdominal exam and no melena or hematochezia on rectal exam.  No focal neurological deficit concerning for CVA.    Differential includes, but not limited to upper versus lower GI bleed, chemotherapy-induced pancytopenia, AKI, electrolyte imbalance, dehydration.  Labs obtained notable for pancytopenia with no need for emergent blood  transfusion, normal lactic acid, mild hyperglycemia otherwise reassuring CMP.  Pancytopenia likely related to patient's chemotherapy.  Patient was recommended admission for observation and potential endoscopy for evaluation of rectal bleeding.  With patient's known pancreatic cancer and share decision-making, decision was made to change her CODE STATUS to DNR CCA.  Patient states she would like to be discharged back to home where she lives with her husband and does have a Copy.  She understands the risks and was given return precautions.  Patient was provided follow-up with a PCP and will follow-up with her hematology oncology specialist.      CONSULTS:   None        I am the Primary Clinician of Record.    FINAL IMPRESSION      1. Pancytopenia (HCC)    2. Rectal bleeding    3. Generalized weakness          DISPOSITION/PLAN     DISPOSITION Decision To Discharge 11/18/2022 03:20:15 PM      PATIENT REFERRED TO:  No, Pcp    Schedule an appointment as soon as possible for a visit   For follow-up      DISCHARGE MEDICATIONS:  Discharge Medication List as of 11/18/2022  3:24 PM          DISCONTINUED MEDICATIONS:  Discharge Medication List as of 11/18/2022  3:24 PM                 (Please note that portions of this note were completed with a voice recognition program.  Efforts were made to edit the dictations but occasionally words are mis-transcribed.)    Beckie Busing, DO (electronically signed)

## 2022-11-18 NOTE — ED Provider Notes (Signed)
-------------------------------------------------------------------------------  Attestation signed by Lynn Breeding, MD at 11/20/2022  3:53 PM  ATTENDING PROVIDER ATTESTATION:     Lynn Blackwell presented Lynn Blackwell the emergency department for evaluation of Rectal Bleeding (Reports rusty color stools. Reports general weakness. Pale in color. ) and Extremity Weakness   and was initially evaluated by the Medical Resident. See Original ED Note for H&P and ED course above.     I have reviewed and discussed the case, including pertinent history (medical, surgical, family and social) and exam findings with the Medical Resident assigned Lynn Blackwell [facility].  I have personally performed and/or participated in the history, exam, medical decision making, and procedures and agree with all pertinent clinical information.     HPI:  Please see below in resident note and ED course.    Physical Exam:  Please see below in resident note and ED course.    Medical Decision Making/ED COURSE:    History From: patient     Patient is a 82 y.o. female presenting Lynn Blackwell the ED for acute onset rectal bleeding, moderate in severity. In the ED, patient was hemodynamically stable and afebrile. Labs obtained. Differential diagnosis includes GI bleed, metastatic disease Lynn Blackwell colon, hemorrhoids, diverticulosis, anemia.     I reviewed and interpreted labs. Labs significant for pancytopenia and worsening anemia though stable from 2 days ago. CMP showed normal kidney function and stable electrolytes. Hyperglycemia but no DKA.    Considered admission for further observation and work up for possible GI bleed.    Extensive conversation with patient and family member at bedside regarding goals of care. Patient does not want admission or invasive work up or treatment. She would like her code status Lynn Blackwell be changed Lynn Blackwell [facility]. She wants Lynn Blackwell be discharged home. She understands risks of hemorrhage associated with GI bleed, and she is willing Lynn Blackwell accept that risk. She  will return Lynn Blackwell the ED if she would like Lynn Blackwell pursue further treatment.    CONSULTS: (Who and What was discussed)  None    Social Determinants of Health : None    Chronic Conditions affecting care:    has a past medical history of Diabetes mellitus (HCC), Endometrial cancer (HCC), Hyperlipidemia, Hypertension, and Pancreatic cancer (HCC).     Problems Addressed:  Rectal bleeding: acute illness  Pancytopenia: acute illness    Amount and/or complexity of Data Reviewed:  External data reviewed: Labs, EKG  Labs ordered and independently interpreted. Decision-making details documented in MDM and ED course.   ECG/medicine tests:  ordered and independent interpretation performed. Decision making details documented in MDM and ED course.    Risk:  Decision regarding hospitalization.   Decision Lynn Blackwell not resuscitate order Lynn Blackwell de-escalate care because of poor prognosis.       ED Course as of 11/20/22 1550   Thu Nov 18, 2022   1248 EKG:  This EKG is signed and interpreted by me, Dr. Nicoletta Ba, MD    Rate: 69  Rhythm: Sinus  Interpretation: Normal sinus rhythm, normal PR interval, normal QRS, normal axis, normal QT interval, no acute ST or T wave changes, LVH  Comparison: stable as compared Lynn Blackwell patient's most recent EKG   [JA]   1342 Rectal exam with female chaperone present reveals nonthrombosed external hemorrhoids, no fissures or bleeding hemorrhoids, no palpable masses, brown stool [Lynn Blackwell]   1546 Extensive conversation with patient and family member at bedside regarding goals of care. Patient does not want admission or invasive work up or treatment. She would like her code  status Lynn Blackwell be changed Lynn Blackwell [facility]. She wants Lynn Blackwell be discharged home. She understands risks of hemorrhage associated with GI bleed, and she is willing Lynn Blackwell accept that risk. She will return Lynn Blackwell the ED if she would like Lynn Blackwell pursue further treatment. [JA]      ED Course User Index  [JA] Blackwell, Lynn Mao, MD  [Lynn Blackwell] Lynn Fuss, DO       Medications   heparin (PF) 100  UNIT/ML injection 500 Units (500 Units IntraCATHeter Given 11/18/22 1549)       Patient remained hemodynamically stable throughout ED course.     Discharge Medication List as of 11/18/2022  3:24 PM        --------------------------------- IMPRESSION AND DISPOSITION ---------------------------------    IMPRESSION  1. Pancytopenia (HCC)    2. Rectal bleeding    3. Generalized weakness        DISPOSITION  Disposition: Discharge Lynn Blackwell home  Patient condition is stable    I, Dr. Minus Breeding, MD, am the primary provider of this record.     I have reviewed my findings and recommendations with the assigned Medical Resident, Lynn Blackwell and members of family present at the time of disposition.    My findings/plan: The primary encounter diagnosis was Pancytopenia (HCC). Diagnoses of Rectal bleeding and Generalized weakness were also pertinent Lynn Blackwell this visit.    Lynn Breeding, MD    -------------------------------------------------------------------------------             [facility] EMERGENCY DEPARTMENT  EMERGENCY DEPARTMENT ENCOUNTER        Pt Name: Lynn Blackwell  MRN: [id number]  Birthdate 1941-09-11  Date of evaluation: 11/18/2022  Provider: Beckie Busing, DO  PCP: No, Pcp  Note Started: 12:46 PM EST 11/18/22    CHIEF COMPLAINT       Chief Complaint   Patient presents with    Rectal Bleeding     Reports rusty color stools. Reports general weakness. Pale in color.     Extremity Weakness       HISTORY OF PRESENT ILLNESS: 1 or more Elements   History From: Patient and son    Limitations Lynn Blackwell history : None    Lynn Blackwell is a 82 y.o. female who presents Lynn Blackwell the emergency department for complaint of rectal bleeding and generalized weakness.  Patient has history of pancreatic cancer and follow-up with oncology today and was advised Lynn Blackwell go Lynn Blackwell the emergency department for further evaluation and management.  She states she lives at home with her spouse and has a home aide who assists  during the days.  The home aide noted bloody stool yesterday.  Patient did not see the stool.  She denies any previous episodes.  She reports ongoing weakness since she had COVID 1 month ago.  She denies any abdominal pain, vomiting, fevers, chills, chest pain, shortness of breath.  Patient does report poor appetite.    Nursing Notes were all reviewed and agreed with or any disagreements were addressed in the HPI.      REVIEW OF EXTERNAL NOTE :       Office visit with oncology 11/12/2022 reviewed.  Patient seen for follow-up of malignant neoplasm of the pancreas.  Patient with poor appetite at that time.    REVIEW OF SYSTEMS :           Positives and Pertinent negatives as per HPI.     SURGICAL HISTORY     Past Surgical History:   Procedure Laterality  Date    CHOLECYSTECTOMY      COLONOSCOPY      ERCP N/A 09/28/2022    ERCP STENT REMOVAL/EXCHANGE performed by Georgeann Oppenheim, MD at [place]    ERCP N/A 09/28/2022    ERCP STONE REMOVAL performed by Georgeann Oppenheim, MD at [place], TOTAL ABDOMINAL (CERVIX REMOVED)      SPINE SURGERY      Plate placed in back    [place]       Discharge Medication List as of 11/18/2022  3:24 PM        CONTINUE these medications which have NOT CHANGED    Details   polyethylene glycol (GLYCOLAX) 17 GM/SCOOP powder Take 17 g by mouth daily as needed (constipation), Disp-1530 g, R-0Print      prochlorperazine (COMPAZINE) 10 MG tablet Take 1 tablet by mouth every 6 hours as needed (n/v), Disp-60 tablet, R-1Normal      ondansetron (ZOFRAN) 4 MG tablet Take 2 tablets by mouth every 8 hours as needed for Nausea or Vomiting, Disp-60 tablet, R-1Normal      potassium chloride (KLOR-CON M) 20 MEQ extended release tablet Take 1 tablet by mouth 2 times daily, Disp-28 tablet, R-0Normal      carvedilol (COREG) 25 MG tablet Take 1 tablet by mouth 2 times dailyHistorical Med      CREON 36000-114000 units CPEP delayed release  capsule Take 1 capsule by mouth 3 times daily (with meals), DAWHistorical Med      glipiZIDE (GLUCOTROL) 10 MG tablet Take 1 tablet by mouth 2 times daily (before meals)Historical Med      traMADol (ULTRAM) 50 MG tablet Take 1 tablet by mouth every 8 hours as needed for Pain.Historical Med      lisinopril (PRINIVIL;ZESTRIL) 20 MG tablet Take 1 tablet by mouth dailyHistorical Med      magnesium 30 MG tablet Take 1 tablet by mouth dailyHistorical Med      b complex vitamins capsule Take 1 capsule by mouth dailyHistorical Med      Calcium Carb-Vit D-C-E-Mineral (OS-CAL ULTRA) 600 MG TABS Take 600 mg by mouth dailyHistorical Med      Cholecalciferol (VITAMIN D) 50 MCG (2000 UT) CAPS capsule Take 2,000 Units by mouth dailyHistorical Med             ALLERGIES     Molds & smuts, Nicotine, and Shellfish allergy    FAMILYHISTORY       Family History   Problem Relation Age of Onset    Breast Cancer Sister 63    Cancer Sister 55        throat    Cancer Maternal Grandmother 73        unknown type        SOCIAL HISTORY       Social History     Tobacco Use    Smoking status: Never    Smokeless tobacco: Never   Vaping Use    Vaping Use: Never used   Substance Use Topics    Alcohol use: Yes     Comment: occassionaly    Drug use: Never       SCREENINGS   NIH Stroke Scale  NIH Stroke Scale Assessed: No    Glasgow Coma Scale  Eye Opening: Spontaneous  Best Verbal Response: Oriented  Best Motor Response: Obeys commands  Glasgow Coma Scale Score: 15  CIWA Assessment  BP: (!) 163/79  Pulse: 71           PHYSICAL EXAM  1 or more Elements     ED Triage Vitals   BP Temp Temp Source Pulse Respirations SpO2 Height Weight   11/18/22 1154 11/18/22 1133 11/18/22 1133 11/18/22 1133 11/18/22 1154 11/18/22 1133 -- --   131/73 97.1 F (36.2 C) Temporal 69 16 100 %            Constitutional/General: Alert and oriented x3, illappearing, non toxic in NAD  Head: NC/AT  Eyes:  EOMI  Mouth: Oropharynx clear, handling secretions, no  trismus  Neck: Supple, full ROM  Pulmonary: Lungs clear Lynn Blackwell auscultation bilaterally, no wheezes, rales, or rhonchi. Not in respiratory distress  Chest: Chest wall port in place with no surrounding erythema  Cardiovascular:  Regular rate and rhythm. 2+ distal pulses  Abdomen: Soft, non tender, non distended,   Extremities: Moves all extremities x 4. Warm and well perfused  Skin: Pale, no rashes  Neurologic: GCS 15, slowed speech  Psych: Normal Affect          DIAGNOSTIC RESULTS   LABS:    Labs Reviewed   CBC WITH AUTO DIFFERENTIAL - Abnormal; Notable for the following components:       Result Value    WBC 2.1 (*)     RBC 2.82 (*)     Hemoglobin 8.0 (*)     Hematocrit 24.2 (*)     RDW 15.9 (*)     Platelets 67 (*)     Neutrophils % 85 (*)     Lymphocytes % 8 (*)     Neutrophils Absolute 1.79 (*)     Lymphocytes Absolute 0.16 (*)     Monocytes Absolute 0.07 (*)     All other components within normal limits   COMPREHENSIVE METABOLIC PANEL - Abnormal; Notable for the following components:    CO2 31 (*)     Anion Gap 6 (*)     Glucose 256 (*)     Calcium 8.4 (*)     Albumin 2.2 (*)     Alkaline Phosphatase 363 (*)     ALT 42 (*)     AST 71 (*)     All other components within normal limits   PROTIME-INR - Abnormal; Notable for the following components:    Protime 15.9 (*)     All other components within normal limits   LACTIC ACID   APTT   PLATELET CONFIRMATION   TYPE AND SCREEN       As interpreted by me, the above displayed labs are abnormal. All other labs obtained during this visit were within normal range or not returned as of this dictation.        RADIOLOGY:   Non-plain film images such as CT, Ultrasound and MRI are read by the radiologist. Plain radiographic images are visualized and preliminarily interpreted by the ED Provider in ed course.    Interpretation per the Radiologist below, if available at the time of this note:    No orders Lynn Blackwell display     No results found.    No results found.    PROCEDURES   Unless  otherwise noted below, none          PAST MEDICAL HISTORY/Chronic Conditions Affecting Care      has a past medical history of Diabetes mellitus ([place]), Endometrial cancer ([place]), Hyperlipidemia, Hypertension, and Pancreatic cancer ([place]).  EMERGENCY DEPARTMENT COURSE    Vitals:    Vitals:    11/18/22 1154 11/18/22 1426 11/18/22 1427 11/18/22 1501   BP: 131/73 127/82  (!) 163/79   Pulse:  59  71   Resp: 16 20  20    Temp:  97.2 F (36.2 C)     TempSrc:  Oral     SpO2:  100%  100%   Weight:   77.6 kg (171 lb)    Height:   1.676 m (5' 6)        Patient was given the following medications:  Medications   heparin (PF) 100 UNIT/ML injection 500 Units (500 Units IntraCATHeter Given 11/18/22 1549)           Is this patient Lynn Blackwell be included in the SEP-1 Core Measure due Lynn Blackwell severe sepsis or septic shock?   No   Exclusion criteria - the patient is NOT Lynn Blackwell be included for SEP-1 Core Measure due Lynn Blackwell:  Infection is not suspected        Medical Decision Making/Differential Diagnosis:    CC/HPI Summary, Social Determinants of health, Records Reviewed, DDx, testing done/not done, ED Course, Reassessment, disposition considerations/shared decision making with patient, consults, disposition:      ED Course as of 11/18/22 1613   Thu Nov 18, 2022   1248 EKG:  This EKG is signed and interpreted by me, Dr. Ebony Cargo, MD    Rate: 69  Rhythm: Sinus  Interpretation: Normal sinus rhythm, normal PR interval, normal QRS, normal axis, normal QT interval, no acute ST or T wave changes, LVH  Comparison: stable as compared Lynn Blackwell patient's most recent EKG   [JA]   1342 Rectal exam with female chaperone present reveals nonthrombosed external hemorrhoids, no fissures or bleeding hemorrhoids, no palpable masses, brown stool [Lynn Blackwell]   1546 Extensive conversation with patient and family member at bedside regarding goals of care. Patient does not want admission or invasive work up or treatment. She would like her code status Lynn Blackwell be changed Lynn Blackwell [facility]. She wants  Lynn Blackwell be discharged home. She understands risks of hemorrhage associated with GI bleed, and she is willing Lynn Blackwell accept that risk. She will return Lynn Blackwell the ED if she would like Lynn Blackwell pursue further treatment. [JA]      ED Course User Index  [JA] Blackwell, Ashby Dawes, MD  [Lynn Blackwell] Duane Boston, DO        Medical Decision Making  Amount and/or Complexity of Data Reviewed  Labs: ordered.  ECG/medicine tests: ordered.    Risk  Prescription drug management.        Patient is a 82 y.o. female who presents for generalized weakness and report of rectal bleeding.  Patient hemodynamically stable on arrival, however pale and ill-appearing.  Patient with a benign abdominal exam and no melena or hematochezia on rectal exam.  No focal neurological deficit concerning for CVA.    Differential includes, but not limited Lynn Blackwell upper versus lower GI bleed, chemotherapy-induced pancytopenia, AKI, electrolyte imbalance, dehydration.  Labs obtained notable for pancytopenia with no need for emergent blood transfusion, normal lactic acid, mild hyperglycemia otherwise reassuring CMP.  Pancytopenia likely related Lynn Blackwell patient's chemotherapy.  Patient was recommended admission for observation and potential endoscopy for evaluation of rectal bleeding.  With patient's known pancreatic cancer and share decision-making, decision was made Lynn Blackwell change her CODE STATUS Lynn Blackwell DNR [facility].  Patient states she would like Lynn Blackwell be discharged back Lynn Blackwell home where she lives with her husband and does have a nursing  aide.  She understands the risks and was given return precautions.  Patient was provided follow-up with a PCP and will follow-up with her hematology oncology specialist.      CONSULTS:   None        I am the Primary Clinician of Record.    FINAL IMPRESSION      1. Pancytopenia (HCC)    2. Rectal bleeding    3. Generalized weakness          DISPOSITION/PLAN     DISPOSITION Decision Lynn Blackwell Discharge 11/18/2022 03:20:15 PM      PATIENT REFERRED Lynn Blackwell:  No, Pcp    Schedule an appointment  as soon as possible for a visit   For follow-up      DISCHARGE MEDICATIONS:  Discharge Medication List as of 11/18/2022  3:24 PM          DISCONTINUED MEDICATIONS:  Discharge Medication List as of 11/18/2022  3:24 PM                 (Please note that portions of this note were completed with a voice recognition program.  Efforts were made Lynn Blackwell edit the dictations but occasionally words are mis-transcribed.)    Lynn Busing, DO (electronically signed)

## 2022-11-19 DIAGNOSIS — M6281 Muscle weakness (generalized): Secondary | ICD-10-CM | POA: Diagnosis not present

## 2022-11-19 DIAGNOSIS — R262 Difficulty in walking, not elsewhere classified: Secondary | ICD-10-CM | POA: Diagnosis not present

## 2022-11-19 DIAGNOSIS — U071 COVID-19: Secondary | ICD-10-CM | POA: Diagnosis not present

## 2022-11-19 DIAGNOSIS — R41841 Cognitive communication deficit: Secondary | ICD-10-CM | POA: Diagnosis not present

## 2022-11-26 ENCOUNTER — Inpatient Hospital Stay: Admit: 2022-11-26 | Payer: MEDICARE

## 2022-11-26 ENCOUNTER — Ambulatory Visit: Admit: 2022-11-26 | Discharge: 2022-11-29 | Payer: MEDICARE | Attending: Hematology & Oncology

## 2022-11-26 DIAGNOSIS — C259 Malignant neoplasm of pancreas, unspecified: Secondary | ICD-10-CM

## 2022-11-26 DIAGNOSIS — Z5111 Encounter for antineoplastic chemotherapy: Secondary | ICD-10-CM

## 2022-11-26 LAB — CBC WITH AUTO DIFFERENTIAL
Absolute Immature Granulocyte: 0.04 10*3/uL (ref 0.00–0.58)
Basophils %: 0 % (ref 0.0–2.0)
Basophils Absolute: 0.02 10*3/uL (ref 0.00–0.20)
Eosinophils %: 1 % (ref 0–6)
Eosinophils Absolute: 0.03 10*3/uL — ABNORMAL LOW (ref 0.05–0.50)
Hematocrit: 28.5 % — ABNORMAL LOW (ref 34.0–48.0)
Hemoglobin: 9 g/dL — ABNORMAL LOW (ref 11.5–15.5)
Immature Granulocytes: 1 % (ref 0.0–5.0)
Lymphocytes %: 17 % — ABNORMAL LOW (ref 20.0–42.0)
Lymphocytes Absolute: 1.13 10*3/uL — ABNORMAL LOW (ref 1.50–4.00)
MCH: 27.7 pg (ref 26.0–35.0)
MCHC: 31.6 g/dL — ABNORMAL LOW (ref 32.0–34.5)
MCV: 87.7 fL (ref 80.0–99.9)
MPV: 9.7 fL (ref 7.0–12.0)
Monocytes %: 19 % — ABNORMAL HIGH (ref 2.0–12.0)
Monocytes Absolute: 1.22 10*3/uL — ABNORMAL HIGH (ref 0.10–0.95)
Neutrophils %: 63 % (ref 43.0–80.0)
Neutrophils Absolute: 4.11 10*3/uL (ref 1.80–7.30)
Platelets: 233 10*3/uL (ref 130–450)
RBC: 3.25 m/uL — ABNORMAL LOW (ref 3.50–5.50)
RDW: 16.9 % — ABNORMAL HIGH (ref 11.5–15.0)
WBC: 6.6 10*3/uL (ref 4.5–11.5)

## 2022-11-26 LAB — COMPREHENSIVE METABOLIC PANEL
ALT: 39 U/L — ABNORMAL HIGH (ref 0–32)
AST: 75 U/L — ABNORMAL HIGH (ref 0–31)
Albumin: 2.3 g/dL — ABNORMAL LOW (ref 3.5–5.2)
Alkaline Phosphatase: 473 U/L — ABNORMAL HIGH (ref 35–104)
Anion Gap: 8 mmol/L (ref 7–16)
BUN: 8 mg/dL (ref 6–23)
CO2: 25 mmol/L (ref 22–29)
Calcium: 8.4 mg/dL — ABNORMAL LOW (ref 8.6–10.2)
Chloride: 100 mmol/L (ref 98–107)
Creatinine: 0.5 mg/dL (ref 0.50–1.00)
Est, Glom Filt Rate: >60 mL/min/{1.73_m2} (ref >60)
Glucose: 87 mg/dL (ref 74–99)
Potassium: 3.9 mmol/L (ref 3.5–5.0)
Sodium: 133 mmol/L (ref 132–146)
Total Bilirubin: 0.8 mg/dL (ref 0.0–1.2)
Total Protein: 7.5 g/dL (ref 6.4–8.3)

## 2022-11-26 MED ORDER — SODIUM CHLORIDE 0.9 % IV SOLN
0.9 | INTRAVENOUS | Status: DC | PRN
Start: 2022-11-26 — End: 2022-11-27
  Administered 2022-11-26: 17:00:00 30 mL/h via INTRAVENOUS

## 2022-11-26 MED ORDER — POTASSIUM CHLORIDE CRYS ER 20 MEQ PO TBCR
20 MEQ | ORAL_TABLET | ORAL | 1 refills | Status: AC
Start: 2022-11-26 — End: 2022-12-11

## 2022-11-26 MED ORDER — ONDANSETRON HCL 4 MG/2ML IJ SOLN
4 | Freq: Once | INTRAMUSCULAR | Status: AC
Start: 2022-11-26 — End: 2022-11-26
  Administered 2022-11-26: 17:00:00 8 mg via INTRAVENOUS

## 2022-11-26 MED ORDER — NORMAL SALINE FLUSH 0.9 % IV SOLN
0.9 | INTRAVENOUS | Status: DC | PRN
Start: 2022-11-26 — End: 2022-11-27
  Administered 2022-11-26 (×4): 10 mL via INTRAVENOUS

## 2022-11-26 MED ORDER — PACLITAXEL PROTEIN-BOUND PART 100 MG IV SUSR
100 | Freq: Once | INTRAVENOUS | Status: AC
Start: 2022-11-26 — End: 2022-11-26
  Administered 2022-11-26: 17:00:00 190 mg via INTRAVENOUS

## 2022-11-26 MED ORDER — HEPARIN NA (PORK) LOCK FLSH PF 100 UNIT/ML IV SOLN
100 | INTRAVENOUS | Status: DC | PRN
Start: 2022-11-26 — End: 2022-11-27
  Administered 2022-11-26: 19:00:00 500 [IU]

## 2022-11-26 MED ORDER — GEMCITABINE HCL 1 GM/26.3ML IV SOLN
1 | Freq: Once | INTRAVENOUS | Status: AC
Start: 2022-11-26 — End: 2022-11-26
  Administered 2022-11-26: 18:00:00 1522 mg/m2 via INTRAVENOUS

## 2022-11-26 MED FILL — SODIUM CHLORIDE 0.9 % IV SOLN: 0.9 % | INTRAVENOUS | Qty: 250

## 2022-11-26 MED FILL — ABRAXANE 100 MG IV SUSR: 100 MG | INTRAVENOUS | Qty: 38

## 2022-11-26 MED FILL — HEPARIN NA (PORK) LOCK FLSH PF 100 UNIT/ML IV SOLN: 100 UNIT/ML | INTRAVENOUS | Qty: 5

## 2022-11-26 MED FILL — GEMCITABINE HCL 1 GM/26.3ML IV SOLN: 1 GM/26.3ML | INTRAVENOUS | Qty: 40.03

## 2022-11-26 MED FILL — ONDANSETRON HCL 4 MG/2ML IJ SOLN: 4 MG/2ML | INTRAMUSCULAR | Qty: 4

## 2022-11-26 MED FILL — NORMAL SALINE FLUSH 0.9 % IV SOLN: 0.9 % | INTRAVENOUS | Qty: 20

## 2022-11-26 NOTE — Progress Notes (Signed)
Gabriella Blake  [facility]  [place] [address]  Dept: [phone]  Loc: [phone]  Attending progress note      Reason for Visit: Pancreatic adenocarcinoma.    Referring Physician: Betsy Coder, MD [facility])    PCP:  No, Pcp    History of Present:     Gabriella Blake is a pleasant 82 year old lady, with a past medical history significant for uterine cancer, status post TAH, type II DM, hyperlipidemia, and hypertension, who was diagnosed with pancreatic cancer in June 2023, the patient received her care initially at [facility], CT scan of the abdomen and the pelvis from 03/22/2022 had revealed an ill-defined 3.5 x 2.5 cm hypodense mass and the uncinate process of the pancreas, hypodense focus in the inferior right lobe of the liver, she had an MRI done on 03/23/2022, revealing a hypoenhancing mass in the uncinate process/inferior head of the pancreas abutting the SMV with distortion of the SMV, less than 180 degree involvement by MR, pancreatic duct narrowing/obstruction, single hepatic lesion in the right hepatic lobe, small lymph node adjacent to the masslike area in the head of the pancreas, she had on 03/26/2022 and a US done, she was found to have a mass in the pancreatic head and uncinate process, FNA was consistent with adenocarcinoma.  The patient was started on systemic palliative therapy with gemcitabine and Abraxane on 04/27/2022, the patient had 3 staging CTs done on 07/13/2022, revealing decreased size of the pancreatic head mass, several new hypodense lesions in the liver, according to the patient, after review of her scans it was not felt that she had disease progression, gemcitabine and Abraxane were continued.  The patient had relocated to [facility] to be with her family, she had lost about 75 pounds, she has nausea, no abdominal pain at this time.  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    The patient had the biliary stent exchange on 09/28/2022 by Dr. Luz Brazen.  Chemotherapy was resumed on 10/01/2022.    The patient was admitted to the hospital with COVID, she had pancytopenia, requiring blood product transfusion.    Today 11/12/2022 chemotherapy was resumed, no nausea or vomiting, she denies any rectal bleed at this time, she will be relocating to [place] to be with her son.    Review of Systems;  CONSTITUTIONAL: No fever, chills.  Decreased appetite and energy level.  Positive for weight loss  ENMT: Eyes: No diplopia; Nose: No epistaxis. Mouth: No sore throat.  RESPIRATORY: No hemoptysis, shortness of breath, cough.   CARDIOVASCULAR: No chest pain, palpitations.  GASTROINTESTINAL: Nausea had improved, no vomiting or abdominal pain, pos for constipation.  GENITOURINARY: No dysuria, urinary frequency, hematuria.  NEURO: No syncope, presyncope, headache.  Remainder:  ROS NEGATIVE    Past Medical History:      Diagnosis Date    Diabetes mellitus ([place])     Endometrial cancer ([place])     Hyperlipidemia     Hypertension     Pancreatic cancer [facility])      Patient Active Problem List   Diagnosis    Malignant neoplasm of pancreas (HCC)    Biliary stricture    Neutropenia (HCC)    Shortness of breath    Hypokalemia    COVID-19 virus infection    Type 2 diabetes mellitus with hyperglycemia, without long-term  current use of insulin (HCC)    Dyspnea on exertion        Past Surgical History:      Procedure Laterality Date    CHOLECYSTECTOMY      COLONOSCOPY      ERCP N/A 09/28/2022    ERCP STENT REMOVAL/EXCHANGE performed by Georgeann Oppenheim, MD at [facility] ENDOSCOPY    ERCP N/A 09/28/2022    ERCP STONE REMOVAL performed by Georgeann Oppenheim, MD at [facility], TOTAL ABDOMINAL (CERVIX REMOVED)      SPINE SURGERY      Plate placed in back    UPPER GASTROINTESTINAL ENDOSCOPY         Family History:  Family History   Problem Relation  Age of Onset    Breast Cancer Sister 30    Cancer Sister 29        throat    Cancer Maternal Grandmother 73        unknown type       Medications:  Reviewed and reconciled.    Social History:  Social History     Socioeconomic History    Marital status: Unknown     Spouse name: Not on file    Number of children: Not on file    Years of education: Not on file    Highest education level: Not on file   Occupational History    Not on file   Tobacco Use    Smoking status: Never    Smokeless tobacco: Never   Vaping Use    Vaping Use: Never used   Substance and Sexual Activity    Alcohol use: Yes     Comment: occassionaly    Drug use: Never    Sexual activity: Not on file   Other Topics Concern    Not on file   Social History Narrative    Not on file     Social Determinants of Health     Financial Resource Strain: Not on file   Food Insecurity: No Food Insecurity (10/15/2022)    Hunger Vital Sign     Worried About Running Out of Food in the Last Year: Never true     Ran Out of Food in the Last Year: Never true   Transportation Needs: No Transportation Needs (10/15/2022)    PRAPARE - Armed forces logistics/support/administrative officer (Medical): No     Lack of Transportation (Non-Medical): No   Physical Activity: Not on file   Stress: Not on file   Social Connections: Not on file   Intimate Partner Violence: Not on file   Housing Stability: Low Risk  (10/15/2022)    Housing Stability Vital Sign     Unable to Pay for Housing in the Last Year: No     Number of Places Lived in the Last Year: 1     Unstable Housing in the Last Year: No       Allergies:  Allergies   Allergen Reactions    Molds & Smuts     Nicotine     Shellfish Allergy      Throat seemed to close       Physical Exam:  BP (!) 159/70   Pulse 69   Temp 97.5 F (36.4 C)   Ht 1.676 m (5' 6"$ )   Wt 77.7 kg (171 lb 6.4 oz)   SpO2 99%   BMI 27.66 kg/m     GENERAL: Alert, oriented x 3,  not in acute distress  HEENT: PERRLA; EOMI. Oropharynx clear.   NECK: Supple. No palpable  cervical or supraclavicular lymphadenopathy.   LUNGS: Good air entry bilaterally. No wheezing, crackles or rhonchi.   CARDIOVASCULAR: Regular rate. No murmurs, rubs or gallops.   ABDOMEN: Soft. Non-tender, non-distended. Positive bowel sounds.  EXTREMITIES: Without clubbing, cyanosis, or edema.   NEUROLOGIC: No focal deficits.   ECOG PS 1    Impression/Plan:     Mrs. Burness is a pleasant 82 year old lady, with a past medical history significant for uterine cancer, status post TAH, type II DM, hyperlipidemia, and hypertension, who was diagnosed with pancreatic cancer in June 2023, the patient received her care initially at [facility] health, CT scan of the abdomen and the pelvis from 03/22/2022 had revealed an ill-defined 3.5 x 2.5 cm hypodense mass and the uncinate process of the pancreas, hypodense focus in the inferior right lobe of the liver, she had an MRI done on 03/23/2022, revealing a hypoenhancing mass in the uncinate process/inferior head of the pancreas abutting the SMV with distortion of the SMV, less than 180 degree involvement by MR, pancreatic duct narrowing/obstruction, single hepatic lesion in the right hepatic lobe, small lymph node adjacent to the masslike area in the head of the pancreas, she had on 03/26/2022 and a US done, she was found to have a mass in the pancreatic head and uncinate process, FNA was consistent with adenocarcinoma.  The patient was started on systemic palliative therapy with gemcitabine and Abraxane on 04/27/2022, the patient had 3 staging CTs done on 07/13/2022, revealing decreased size of the pancreatic head mass, several new hypodense lesions in the liver, according to the patient, after review of her scans it was not felt that she had disease progression, gemcitabine and Abraxane were continued.  The patient had relocated to [facility] to be with her family, she had lost about 75 pounds, she has nausea, no abdominal pain at this time.  The patient had bili obstruction and an ERCP  done on 05/07/2022, with placement of covered metal stent, was found to have ulcerative tumor invading the duodenum,  The patient had the biliary stent exchange on 09/28/2022 by Dr. Luz Brazen.      Records from [facility] were reviewed, discussed with the patient her diagnosis, and prognosis, recommended restating CT scans of the chest, abdomen, and pelvis, were done on 09/23/2022, which I had reviewed, she has a small 2 to 3 mm noncalcified nodules in the right upper and right mid lung, nonspecific, ill-defined fullness of the pancreatic head around 2 cm, hypoenhancing focus concerning for underlying malignancy, surrounding adenopathy without metastatic disease otherwise noted.  On 10/01/2022, chemotherapy was resumed, The patient was admitted to the hospital with COVID, she had pancytopenia, requiring blood product transfusion, hospitalization records reviewed, today 11/12/2022 chemotherapy will be resumed.    The patient is doing well clinically, today 11/26/2022, labs reviewed, blood counts adequate for treatment, bilirubin had normalized, proceed with gemcitabine and Abraxane, CA 19-9 is trending down it had decreased to 1608, will continue to monitor.  The patient to be relocating to [place], her daughter-in-law is in the medical field, she will let us know who is the medical oncologist she would like to see in [place].    RTC in 2 weeks.    Thank you for allowing [place] to participate in the care of Mrs. Knechtel.    Ragen Laver EL AMIL, MD   HEMATOLOGY/MEDICAL [place]  [facility]  [address]  [place] [address]  Dept: [place]: [phone]

## 2022-11-26 NOTE — Progress Notes (Signed)
Patient tolerated Abraxane /Gemzar infusion  well. Remained on unit for 20 minutes after treatment. Patient alert and oriented x3. No distress noted. Vital signs stable however BP elevated. Dr. Ky Barban Amil aware of vital signs , ok to discharge. Pt to monitor vital signs at home. . Patient denies any new or worsening pain. Educated patient on possible side effects and treatment of medication.  Encouraged patient to take her prescribed at home blood pressure medication.   Patient verbalized understanding. Offered patient education and/or discharge material. Patient declined.. Patient denies any needs. All questions answered. D/C in stable condition.

## 2022-11-26 NOTE — Progress Notes (Signed)
Notified [facility] received a weight notification when Gemzar released. 12.1% decrease since 09/17/22.

## 2022-11-27 LAB — CANCER ANTIGEN 19-9: CA 19-9: 1608 U/mL — ABNORMAL HIGH (ref 0–35)

## 2022-11-29 NOTE — Progress Notes (Signed)
Patient provided with discharge instructions, received printed AVS.  All questions answered.  Patient understands follow up plan of care.

## 2022-12-03 NOTE — Telephone Encounter (Signed)
Patient's son is calling, states calling back to provide information that someone called the patient for previous oncologist was Dr. Betsy Coder w/[facility] in [place], [place], [place]

## 2022-12-05 ENCOUNTER — Emergency Department: Admit: 2022-12-05 | Payer: MEDICARE

## 2022-12-05 ENCOUNTER — Inpatient Hospital Stay: Admit: 2022-12-05 | Discharge: 2022-12-06 | Disposition: A | Discharge status: Home / Self Care | Payer: MEDICARE

## 2022-12-05 DIAGNOSIS — E162 Hypoglycemia, unspecified: Secondary | ICD-10-CM

## 2022-12-05 DIAGNOSIS — E11649 Type 2 diabetes mellitus with hypoglycemia without coma: Principal | ICD-10-CM

## 2022-12-05 DIAGNOSIS — M4802 Spinal stenosis, cervical region: Secondary | ICD-10-CM | POA: Diagnosis not present

## 2022-12-05 DIAGNOSIS — E778 Other disorders of glycoprotein metabolism: Secondary | ICD-10-CM | POA: Diagnosis not present

## 2022-12-05 DIAGNOSIS — E8809 Other disorders of plasma-protein metabolism, not elsewhere classified: Secondary | ICD-10-CM | POA: Diagnosis not present

## 2022-12-05 DIAGNOSIS — M47812 Spondylosis without myelopathy or radiculopathy, cervical region: Secondary | ICD-10-CM | POA: Diagnosis not present

## 2022-12-05 DIAGNOSIS — E871 Hypo-osmolality and hyponatremia: Secondary | ICD-10-CM | POA: Diagnosis not present

## 2022-12-05 DIAGNOSIS — Z7984 Long term (current) use of oral hypoglycemic drugs: Secondary | ICD-10-CM | POA: Diagnosis not present

## 2022-12-05 DIAGNOSIS — J329 Chronic sinusitis, unspecified: Secondary | ICD-10-CM | POA: Diagnosis not present

## 2022-12-05 DIAGNOSIS — D61818 Other pancytopenia: Secondary | ICD-10-CM | POA: Diagnosis not present

## 2022-12-05 DIAGNOSIS — I771 Stricture of artery: Secondary | ICD-10-CM | POA: Diagnosis not present

## 2022-12-05 LAB — BLOOD GAS, VENOUS
Base Excess, Ven: 1.3 mmol/L (ref <=3.0)
Carboxyhemoglobin: 4.6 % — ABNORMAL HIGH (ref 0.0–1.5)
HCO3, Venous: 25.7 mmol/L (ref 23.0–29.0)
MetHgb, Ven: 0.2 % (ref <1.5)
O2 Content, Ven: 13 VOL %
O2 Sat, Ven: 100 %
TC02 (Calc), Ven: 60 mmol/L
pCO2, Ven: 38.9 mmHg — ABNORMAL LOW (ref 40.0–50.0)
pH, Ven: 7.429 (ref 7.350–7.450)
pO2, Ven: 147 mmHg — ABNORMAL HIGH (ref 25.0–40.0)

## 2022-12-05 LAB — POCT GLUCOSE: POC Glucose: 159 mg/dl — ABNORMAL HIGH (ref 70–99)

## 2022-12-05 LAB — LACTATE, SEPSIS: Lactic Acid, Sepsis: 1.1 mmol/L (ref 0.4–1.9)

## 2022-12-05 NOTE — ED Provider Notes (Signed)
EKG interpretation by me in absence of a face-to-face evaluation by me as follows:    The 12 lead EKG was interpreted by me independent of a cardiologist as follows:  Rate: normal with a rate of 76  Rhythm: sinus, PAC and junctional escape complexes  Axis: normal  Intervals: normal PR, narrow QRS, normal QTc  ST segments: no ST elevations or depressions  T waves: no abnormal inversions  Non-specific T wave changes: present  LVH  Prior EKG comparison: EKG dated 11/18/22 is not significantly different        Waldo Laine, MD  12/05/22 [phone]

## 2022-12-05 NOTE — ED Provider Notes (Signed)
[place]        Pt Name: Gabriella Blake  MRN: [id number]  [place] July 23, 1941  Date of evaluation: 12/05/2022  Provider: Bess Harvest, PA-C  PCP: No, Pcp  Note Started: 6:13 PM EST 12/05/22      APP. I have evaluated this patient.        CHIEF COMPLAINT       Chief Complaint   Patient presents with    Hypoglycemia     Pt in per EMS from home found by son face down uncon, but breathing. BS 50 upon arrival of EMS. 2109m D10 given. Pt hc pancreatic CA. Alert amd oriented.        HISTORY OF PRESENT ILLNESS: 1 or more Elements     History from : Patient    Limitations to history : None    EMalley Escobaris a 82y.o. female with a history of hypertension, hyperlipidemia, diabetes and pancreatic cancer currently undergoing chemotherapy who presents to the emergency department today via ambulance from home after patient's son found her unresponsive with a glucose of 50.  Patient was given 250 mL of D10 and route and patient is alert and oriented x 3 with GCS 15 on arrival.  She has no focal neurologic deficits.  She has no complaints when asked.  She denies headache, lightheadedness or dizziness.  She denies having any chest pain or shortness of breath.  She has no GI complaints.        Nursing Notes were all reviewed and agreed with or any disagreements were addressed in the HPI.    REVIEW OF SYSTEMS :      Review of Systems   Constitutional:  Negative for chills and fever.   Respiratory:  Negative for cough, chest tightness and shortness of breath.    Cardiovascular:  Negative for chest pain, palpitations and leg swelling.   Gastrointestinal:  Negative for abdominal pain, diarrhea, nausea and vomiting.   Genitourinary:  Negative for difficulty urinating, dysuria, flank pain, frequency, hematuria and urgency.   Neurological:  Negative for dizziness, tremors, seizures, facial asymmetry, speech difficulty, light-headedness, numbness and  headaches.   All other systems reviewed and are negative.      Positives and Pertinent negatives as per HPI.     SURGICAL HISTORY     Past Surgical History:   Procedure Laterality Date    CHOLECYSTECTOMY      COLONOSCOPY      ERCP N/A 09/28/2022    ERCP STENT REMOVAL/EXCHANGE [facility]   ERCP N/A 09/28/2022    ERCP STONE [facility]AL [facility] TOTAL ABDOMINAL (CERVIX REMOVED)      SPINE SURGERY      Plate placed in back    UPPER GASTROINTESTINAL ENDOSCOPY         CURRENTMEDICATIONS       Previous Medications    B COMPLEX VITAMINS CAPSULE    Take 1 capsule by mouth daily    CALCIUM CARB-VIT D-C-E-MINERAL (OS-CAL ULTRA) 600 MG TABS    Take 600 mg by mouth daily    CARVEDILOL (COREG) 25 MG TABLET    Take 1 tablet by mouth 2 times daily    CHOLECALCIFEROL (VITAMIN D) 50 MCG (2000 UT) CAPS CAPSULE    Take 2,000 Units by mouth daily    CREON 36000-114000 UNITS CPEP DELAYED RELEASE CAPSULE  Take 1 capsule by mouth 3 times daily (with meals)    GLIPIZIDE (GLUCOTROL) 10 MG TABLET    Take 1 tablet by mouth 2 times daily (before meals)    LISINOPRIL (PRINIVIL;ZESTRIL) 20 MG TABLET    Take 1 tablet by mouth daily    MAGNESIUM 30 MG TABLET    Take 1 tablet by mouth daily    ONDANSETRON (ZOFRAN) 4 MG TABLET    Take 2 tablets by mouth every 8 hours as needed for Nausea or Vomiting    POTASSIUM CHLORIDE (KLOR-CON M) 20 MEQ EXTENDED RELEASE TABLET    TAKE 1 TABLET BY MOUTH TWICE DAILY X 15 DAYS    PROCHLORPERAZINE (COMPAZINE) 10 MG TABLET    Take 1 tablet by mouth every 6 hours as needed (n/v)    TRAMADOL (ULTRAM) 50 MG TABLET    Take 1 tablet by mouth every 8 hours as needed for Pain.       ALLERGIES     Molds & smuts, Nicotine, and Shellfish allergy    FAMILYHISTORY       Family History   Problem Relation Age of Onset    Breast Cancer Sister 33    Cancer Sister 10        throat    Cancer Maternal Grandmother 73        unknown type         SOCIAL HISTORY       Social History     Tobacco Use    Smoking status: Never    Smokeless tobacco: Never   Vaping Use    Vaping Use: Never used   Substance Use Topics    Alcohol use: Yes     Comment: occassionaly    Drug use: Never       SCREENINGS   NIH Stroke Scale  Interval: Baseline  Level of Consciousness (1a): Alert  LOC Questions (1b): Answers both correctly  LOC Commands (1c): Performs both tasks correctly  Best Gaze (2): Normal  Visual (3): No visual loss  Facial Palsy (4): Normal symmetrical movement  Motor Arm, Left (5a): No drift  Motor Arm, Right (5b): No drift  Motor Leg, Left (6a): No drift  Motor Leg, Right (6b): No drift  Limb Ataxia (7): Absent  Sensory (8): Normal  Best Language (9): No aphasia  Dysarthria (10): Normal  Extinction and Inattention (11): No abnormality  Total: 0    Glasgow Coma Scale  Eye Opening: Spontaneous  Best Verbal Response: Oriented  Best Motor Response: Obeys commands  Glasgow Coma Scale Score: 15                CIWA Assessment  BP: (!) 151/70  Pulse: 76           PHYSICAL EXAM  1 or more Elements     ED Triage Vitals [12/05/22 1720]   BP Temp Temp Source Pulse Respirations SpO2 Height Weight - Scale   (!) 151/70 97.7 F (36.5 C) Oral 76 20 98 % 1.676 m (5' 6"$ ) 77.1 kg (170 lb)       Physical Exam  Vitals and nursing note reviewed.   Constitutional:       Appearance: She is well-developed. She is not diaphoretic.   HENT:      Head: Normocephalic and atraumatic.      Mouth/Throat:      Mouth: Mucous membranes are moist.   Eyes:      General:  Right eye: No discharge.         Left eye: No discharge.      Extraocular Movements: Extraocular movements intact.      Conjunctiva/sclera: Conjunctivae normal.      Pupils: Pupils are equal, round, and reactive to light.   Cardiovascular:      Rate and Rhythm: Normal rate and regular rhythm.      Pulses: Normal pulses.      Heart sounds: Normal heart sounds.   Pulmonary:      Effort: Pulmonary effort is normal. No respiratory  distress.      Breath sounds: Normal breath sounds.   Abdominal:      General: Abdomen is flat. Bowel sounds are normal. There is no distension.      Palpations: Abdomen is soft.      Tenderness: There is no abdominal tenderness. There is no guarding.   Musculoskeletal:         General: Normal range of motion.      Cervical back: Normal range of motion and neck supple.   Skin:     General: Skin is warm and dry.      Capillary Refill: Capillary refill takes less than 2 seconds.      Coloration: Skin is not pale.   Neurological:      Mental Status: She is alert and oriented to person, place, and time.      GCS: GCS eye subscore is 4. GCS verbal subscore is 5. GCS motor subscore is 6.      Cranial Nerves: Cranial nerves 2-12 are intact.      Sensory: Sensation is intact.      Motor: Motor function is intact.      Coordination: Coordination is intact.   Psychiatric:         Behavior: Behavior normal.             DIAGNOSTIC RESULTS   LABS:    Labs Reviewed   CBC WITH AUTO DIFFERENTIAL - Abnormal; Notable for the following components:       Result Value    WBC 3.1 (*)     RBC 3.07 (*)     Hemoglobin 8.8 (*)     Hematocrit 27.1 (*)     RDW 16.2 (*)     Platelets 105 (*)     Neutrophils Absolute 1.5 (*)     Lymphocytes Absolute 0.9 (*)     Anisocytosis Occasional (*)     Microcytes Occasional (*)     Polychromasia Occasional (*)     Schistocytes Occasional (*)     All other components within normal limits   BASIC METABOLIC PANEL - Abnormal; Notable for the following components:    Sodium 132 (*)     CO2 20 (*)     BUN 5 (*)     Creatinine <0.5 (*)     Calcium 7.0 (*)     All other components within normal limits   HEPATIC FUNCTION PANEL - Abnormal; Notable for the following components:    Total Protein 5.7 (*)     Albumin 1.7 (*)     Alkaline Phosphatase 255 (*)     AST 68 (*)     All other components within normal limits   AMYLASE - Abnormal; Notable for the following components:    Amylase 10 (*)     All other components  within normal limits   LIPASE - Abnormal; Notable for the following components:  Lipase 4.0 (*)     All other components within normal limits   TROPONIN - Abnormal; Notable for the following components:    Troponin, High Sensitivity 18 (*)     All other components within normal limits   TROPONIN - Abnormal; Notable for the following components:    Troponin, High Sensitivity 18 (*)     All other components within normal limits   BRAIN NATRIURETIC PEPTIDE - Abnormal; Notable for the following components:    Pro-BNP 1,268 (*)     All other components within normal limits   BETA-HYDROXYBUTYRATE - Abnormal; Notable for the following components:    Beta-Hydroxybutyrate 0.60 (*)     All other components within normal limits   BLOOD GAS, VENOUS - Abnormal; Notable for the following components:    pCO2, Ven 38.9 (*)     pO2, Ven 147.0 (*)     Carboxyhemoglobin 4.6 (*)     All other components within normal limits   POCT GLUCOSE - Abnormal; Notable for the following components:    POC Glucose 159 (*)     All other components within normal limits   LACTATE, SEPSIS   CK   URINALYSIS WITH REFLEX TO CULTURE   LACTATE, SEPSIS   POCT GLUCOSE   POCT GLUCOSE       When ordered only abnormal lab results are displayed. All other labs were within normal range or not returned as of this dictation.    EKG: When ordered, EKG's are interpreted by the Emergency Department Physician in the absence of a cardiologist.  Please see their note for interpretation of EKG.    RADIOLOGY:   Non-plain film images such as CT, Ultrasound and MRI are read by the radiologist. Plain radiographic images are visualized and preliminarily interpreted by the ED Provider with the below findings:        Interpretation per the Radiologist below, if available at the time of this note:    CT CERVICAL SPINE WO CONTRAST   Final Result   No acute abnormality of the cervical spine.         CT HEAD WO CONTRAST   Final Result   No acute intracranial abnormality.          XR CHEST PORTABLE   Final Result      Lungs are clear             PROCEDURES   Unless otherwise noted below, none     Procedures    CRITICAL CARE TIME (.cctime)       PAST MEDICAL HISTORY      has a past medical history of Diabetes mellitus ([place]), Endometrial cancer ([place]), Hyperlipidemia, Hypertension, and Pancreatic cancer ([place]).     Chronic Conditions affecting Care: None    EMERGENCY DEPARTMENT COURSE and DIFFERENTIAL DIAGNOSIS/MDM:   Vitals:    Vitals:    12/05/22 1720   BP: (!) 151/70   Pulse: 76   Resp: 20   Temp: 97.7 F (36.5 C)   TempSrc: Oral   SpO2: 98%   Weight: 77.1 kg (170 lb)   Height: 1.676 m (5' 6"$ )       Patient was given the following medications:  Medications   sodium chloride 0.9 % bolus 1,000 mL (1,000 mLs IntraVENous New Bag 12/05/22 1951)             Is this patient to be included in the SEP-1 Core Measure due to severe sepsis or septic shock?   No  Exclusion criteria - the patient is NOT to be included for SEP-1 Core Measure due to:  Infection is not suspected    CONSULTS: (Who and What was discussed)  None  Discussion with Other Profesionals : None    Social Determinants : None    Records Reviewed : None    CC/HPI Summary, DDx, ED Course, and Reassessment: Patient with a history of hypertension, hyperlipidemia, diabetes and pancreatic cancer currently undergoing chemotherapy presented to the emergency department today via ambulance from home after patient's son found her in her bed with altered mental status.  Son states patient's eyes were open but she was not really talking.  Paramedics found her glucose to be 50.  She was given D10 and route to the emergency department.  On arrival, she is alert and oriented x 3 with GCS 15.  NIH is 0.  She has no complaints.  She does not take insulin but does take glipizide.  Patient denies having any pain anywhere.    EKG completed shows no signs of acute ischemia or infarction.  Initial and delta troponins are 18.  Her troponin appears chronically  elevated and stable today.  proBNP is 1268 and chest x-ray shows no acute cardiopulmonary disease.    CBC with a pancytopenia which appears chronic.  BMP with a mild hyponatremia 132 and hypocalcemia 7.0.  LFTs with a hypoproteinemia 5.7 and hypoalbuminemia of 1.7.  Amylase and lipase are low.  Beta hydroxybutyrate is mildly elevated at 0.60.  VBG with a normal pH.  CK and lactate are both normal.    CT head and cervical spine are unremarkable.    Patient is given a 1 L fluid bolus.  She is given something to eat    Disposition Considerations (include 1 Tests not done, Shared Decision Making, Pt Expectation of Test or Tx.): I had a lengthy discussion with the patient and her son regarding testing completed in the emergency department today as well as the results.  Advised I recommend admission for further evaluation and treatment of her hyperglycemia as well as other electrolyte abnormalities.  Patient and son state that they have an appointment with a new oncologist tomorrow at [facility] regarding her pancreatic cancer and they declined admission.  Patient's repeat glucose is 90 and she is eating at this time.  I do not feel this warrants an AMA discharge.  Patient is given very strict return precautions.      Appropriate for outpatient management        I am the Primary Clinician of Record.    FINAL IMPRESSION      1. Hypoglycemia    2. Pancytopenia ([place])    3. Hyponatremia    4. Hypocalcemia    5. Hypoproteinemia ([place])    6. Hypoalbuminemia          DISPOSITION/PLAN     DISPOSITION Decision To Discharge 12/05/2022 09:07:38 PM      PATIENT REFERRED TO:  Your Oncologist    Go in 1 day        DISCHARGE MEDICATIONS:  New Prescriptions    No medications on file       DISCONTINUED MEDICATIONS:  Discontinued Medications    No medications on file              (Please note that portions of this note were completed with a voice recognition program.  Efforts were made to edit the dictations but occasionally  words are mis-transcribed.)    Leory Plowman  Delora Fuel, PA-C (electronically signed)           Bess Harvest, PA-C  12/05/22 2116

## 2022-12-06 ENCOUNTER — Ambulatory Visit: Admit: 2022-12-06 | Discharge: 2022-12-06 | Payer: Medicare (Managed Care) | Attending: Hematology & Oncology

## 2022-12-06 ENCOUNTER — Inpatient Hospital Stay: Admit: 2022-12-06 | Payer: Medicare (Managed Care) | Attending: Hematology & Oncology

## 2022-12-06 DIAGNOSIS — C259 Malignant neoplasm of pancreas, unspecified: Secondary | ICD-10-CM

## 2022-12-06 LAB — HEPATIC FUNCTION PANEL
ALT: 33 U/L (ref 10–40)
AST: 68 U/L — ABNORMAL HIGH (ref 15–37)
Albumin: 1.7 g/dL — ABNORMAL LOW (ref 3.4–5.0)
Alkaline Phosphatase: 255 U/L — ABNORMAL HIGH (ref 40–129)
Bilirubin, Direct: <0.2 mg/dL (ref 0.0–0.3)
Total Bilirubin: 0.4 mg/dL (ref 0.0–1.0)
Total Protein: 5.7 g/dL — ABNORMAL LOW (ref 6.4–8.2)

## 2022-12-06 LAB — BASIC METABOLIC PANEL
Anion Gap: 6 (ref 3–16)
BUN: 5 mg/dL — ABNORMAL LOW (ref 7–20)
CO2: 20 mmol/L — ABNORMAL LOW (ref 21–32)
Calcium: 7 mg/dL — ABNORMAL LOW (ref 8.3–10.6)
Chloride: 106 mmol/L (ref 99–110)
Creatinine: <0.5 mg/dL — ABNORMAL LOW (ref 0.6–1.2)
Est, Glom Filt Rate: >60 (ref >60)
Glucose: 87 mg/dL (ref 70–99)
Potassium: 3.7 mmol/L (ref 3.5–5.1)
Sodium: 132 mmol/L — ABNORMAL LOW (ref 136–145)

## 2022-12-06 LAB — TROPONIN
Troponin, High Sensitivity: 18 ng/L — ABNORMAL HIGH (ref 0–14)
Troponin, High Sensitivity: 18 ng/L — ABNORMAL HIGH (ref 0–14)

## 2022-12-06 LAB — CBC WITH AUTO DIFFERENTIAL
Basophils %: 0 %
Basophils Absolute: 0 10*3/uL (ref 0.0–0.2)
Eosinophils %: 0 %
Eosinophils Absolute: 0 10*3/uL (ref 0.0–0.6)
Hematocrit: 27.1 % — ABNORMAL LOW (ref 36.0–48.0)
Hemoglobin: 8.8 g/dL — ABNORMAL LOW (ref 12.0–16.0)
Lymphocytes %: 30 %
Lymphocytes Absolute: 0.9 10*3/uL — ABNORMAL LOW (ref 1.0–5.1)
MCH: 28.7 pg (ref 26.0–34.0)
MCHC: 32.5 g/dL (ref 31.0–36.0)
MCV: 88.2 fL (ref 80.0–100.0)
MPV: 8.2 fL (ref 5.0–10.5)
Monocytes %: 22 %
Monocytes Absolute: 0.7 10*3/uL (ref 0.0–1.3)
Neutrophils %: 48 %
Neutrophils Absolute: 1.5 10*3/uL — ABNORMAL LOW (ref 1.7–7.7)
PLATELET SLIDE REVIEW: DECREASED
Platelets: 105 10*3/uL — ABNORMAL LOW (ref 135–450)
RBC: 3.07 M/uL — ABNORMAL LOW (ref 4.00–5.20)
RDW: 16.2 % — ABNORMAL HIGH (ref 12.4–15.4)
WBC: 3.1 10*3/uL — ABNORMAL LOW (ref 4.0–11.0)

## 2022-12-06 LAB — LIPASE: Lipase: 4 U/L — ABNORMAL LOW (ref 13.0–60.0)

## 2022-12-06 LAB — CK: Total CK: 75 U/L (ref 26–192)

## 2022-12-06 LAB — BRAIN NATRIURETIC PEPTIDE: Pro-BNP: 1268 pg/mL — ABNORMAL HIGH (ref 0–449)

## 2022-12-06 LAB — AMYLASE: Amylase: 10 U/L — ABNORMAL LOW (ref 25–115)

## 2022-12-06 LAB — POCT GLUCOSE: POC Glucose: 90 mg/dl (ref 70–99)

## 2022-12-06 LAB — BETA-HYDROXYBUTYRATE: Beta-Hydroxybutyrate: 0.6 mmol/L — ABNORMAL HIGH (ref 0.00–0.27)

## 2022-12-06 MED ORDER — SODIUM CHLORIDE 0.9 % IV BOLUS
0.9 | Freq: Once | INTRAVENOUS | Status: AC
Start: 2022-12-06 — End: 2022-12-05
  Administered 2022-12-06: 01:00:00 1000 mL via INTRAVENOUS

## 2022-12-06 NOTE — Progress Notes (Signed)
Met with patient and patient's son, Jeneen Rinks during appointment with Dr Dennis Bast. Explained the role of nurse navigator. Patient here to discuss pancreatic cancer. Plan is for home hospice. Patient given a folder with contact information for Dr. Marcelino Scot treatment team. Encouraged to call with questions or concerns.

## 2022-12-06 NOTE — Progress Notes (Addendum)
REFERRED BY: Sonia Baller RN   REASON FOR REFERRAL: Hospice Referral     SW was asked to see patient regarding hospice. SW met with pt and pt's son in exam room. SW introduced self and role. Pt and son receptive and engaging in the conversation. Pt stated she has had little experience with hospice. SW educated pt and son on hospice and what services they will provide. Pt would like hospice at home. Pt has no preference for which agency. Pt expressed concerns for her husband stating that he will have a really hard time with this. Pt's husband is 27 and has dialysis 3x weekly. SW asked if pt or husband has been connected with [facility]. Pt's son says they know a little about it, but any help would be beneficial. Pt's husband's info is not listed. When SW makes hospice referral, SW will include note about COA services for husband and see if hospice is able to assist with that.     SW faxed referral to [facility] [phone]    UPDATE 2/28: SW received email from Bar Nunn from [place] stating that pt's daughter in law called hospice and reported that pt had died on 12/17/22 AM. SW sent message to [place].    Darreld Mclean MSW, LSW  Outpatient ENT, Brain, & Melanoma Social Worker  [phone]    No future appointments.

## 2022-12-06 NOTE — Progress Notes (Unsigned)
CC: New visit for metastatic pancreatic adenocarcinoma      HPI: Lynn Blackwell is an 81-year olf female, who has the following relevant history:  HX HTN HLD DM  June 2023:presented to the ED for evaluation after several weeks of hyperglycemia, abd pain, diarrhea and weight loss.   March 22, 2022: CT A/P in the ED showed 3.6cmx2.5cm mass in the uncinate process   March 23, 2022: MRI: re-demonstrated the pancreatic mass, Right hepatic lobe lesion   March 26, 2022: EUS/bx: pancreatic mass seen again; FNA + adenocarcinoma   April 07, 2022: CT pancreas protocol showed mass with vascular involvement, and R hepatic lesion c/f metastatic disease.  July 18-Nov 2023: underwent Gemcitabine/nab-paclitaxel (course complicated by leg edema, neutropenia, candidiasis fever and obstructive jaundice, requiring ERCP 05/07/2022 requiring metal biliary stent)  Jul 13, 2022: re-staging CT C/A/P showed the pancreatic head mass smaller, right hepatic lobe liver lesion smaller, several new hypodense liver lesions could be inflammatory or neoplastic.   Sep 07, 2022: final OV with Dr Truett Perna; c/f biliary stent occlusion.     All of the above with Ladene Artist MD at [facility] [place]      Here to discuss further management.     PMH: As above. Medicines, allergies reviewed.  SH:   FH: Sister deceased with breast cancer; sister with throat cancer, deceased due to blood clots; maternal grandmother with cancer, type unknown      IH/ROS: . No other remarkable symptoms on a complete ROS.     On Examination:  Alert and oriented. In no distress.  No pallor, icterus, lymphadenopathy or edema.  Port in place.  HEENT: WNL.  Chest: Clear. No additional sounds.  CVS: Regular. No murmurs.  Abd: Non-tender. No organomegaly or free fluid.  CNS: No focal deficits.  Skin: No rash.  Psych: Normal affect.     Studies:  Reviewed all pertinent records, labs, test results, pathology results, and images.     Impression and  Recommendations:       Discussed findings and recommendations with the patient in detail:     Last chemo per [facility] in Mid-Nov  11/28 concern for bilairy stent occlusion and was advised to remain in [place] until this was evaluated;unclear if she actually stayed.  In the interim,moved to [place].   I cannot see that she has had further treatment for her cancer.  [place] oncologist suggested re staging scans after she relocated.

## 2022-12-07 DIAGNOSIS — Z0389 Encounter for observation for other suspected diseases and conditions ruled out: Secondary | ICD-10-CM | POA: Diagnosis not present

## 2022-12-07 LAB — EKG 12-LEAD
Atrial Rate: 76 {beats}/min
P Axis: 42 degrees
P-R Interval: 192 ms
Q-T Interval: 404 ms
QRS Duration: 92 ms
QTc Calculation (Bazett): 454 ms
R Axis: -5 degrees
T Axis: 11 degrees
Ventricular Rate: 76 {beats}/min

## 2022-12-10 ENCOUNTER — Encounter: Payer: MEDICARE | Attending: Adult Health

## 2022-12-10 ENCOUNTER — Inpatient Hospital Stay: Payer: MEDICARE

## 2022-12-10 MED ORDER — NORMAL SALINE FLUSH 0.9 % IV SOLN
0.9 | INTRAVENOUS | Status: AC | PRN
Start: 2022-12-10 — End: 2022-12-11

## 2022-12-10 MED ORDER — HEPARIN NA (PORK) LOCK FLSH PF 100 UNIT/ML IV SOLN
100 | INTRAVENOUS | Status: AC | PRN
Start: 2022-12-10 — End: 2022-12-11

## 2022-12-10 NOTE — Progress Notes (Unsigned)
[facility]  [place] [address]  Dept: [phone]  Loc: [phone]  Attending progress note      Reason for Visit: Pancreatic adenocarcinoma.    Referring Physician: Betsy Coder, MD [facility])    PCP:  No, Pcp    History of Present:     Gabriella Blake is a pleasant 82 year old lady, with a past medical history significant for uterine cancer, status post TAH, type II DM, hyperlipidemia, and hypertension, who was diagnosed with pancreatic cancer in June 2023, the patient received her care initially at [facility] health, CT scan of the abdomen and the pelvis from 03/22/2022 had revealed an ill-defined 3.5 x 2.5 cm hypodense mass and the uncinate process of the pancreas, hypodense focus in the inferior right lobe of the liver, she had an MRI done on 03/23/2022, revealing a hypoenhancing mass in the uncinate process/inferior head of the pancreas abutting the SMV with distortion of the SMV, less than 180 degree involvement by MR, pancreatic duct narrowing/obstruction, single hepatic lesion in the right hepatic lobe, small lymph node adjacent to the masslike area in the head of the pancreas, she had on 03/26/2022 and a US done, she was found to have a mass in the pancreatic head and uncinate process, FNA was consistent with adenocarcinoma.  The patient was started on systemic palliative therapy with gemcitabine and Abraxane on 04/27/2022, the patient had 3 staging CTs done on 07/13/2022, revealing decreased size of the pancreatic head mass, several new hypodense lesions in the liver, according to the patient, after review of her scans it was not felt that she had disease progression, gemcitabine and Abraxane were continued.  The patient had relocated to [facility] to be with her family, she had lost about 75 pounds, she has nausea, no abdominal pain at this time.  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    The patient had the biliary stent exchange on 09/28/2022 by Dr. Luz Brazen.  Chemotherapy was resumed on 10/01/2022.    The patient was admitted to the hospital with COVID, she had pancytopenia, requiring blood product transfusion.    Today 11/12/2022 chemotherapy was resumed, no nausea or vomiting, she denies any rectal bleed at this time, she will be relocating to [place] to be with her son.    Review of Systems;  CONSTITUTIONAL: No fever, chills.  Decreased appetite and energy level.  Positive for weight loss  ENMT: Eyes: No diplopia; Nose: No epistaxis. Mouth: No sore throat.  RESPIRATORY: No hemoptysis, shortness of breath, cough.   CARDIOVASCULAR: No chest pain, palpitations.  GASTROINTESTINAL: Nausea had improved, no vomiting or abdominal pain, pos for constipation.  GENITOURINARY: No dysuria, urinary frequency, hematuria.  NEURO: No syncope, presyncope, headache.  Remainder:  ROS NEGATIVE    Past Medical History:      Diagnosis Date    Diabetes mellitus ([place])     Endometrial cancer ([place])     Hyperlipidemia     Hypertension     Pancreatic cancer [facility])      Patient Active Problem List   Diagnosis    Malignant neoplasm of pancreas (HCC)    Biliary stricture    Neutropenia (HCC)    Shortness of breath    Hypokalemia    COVID-19 virus infection    Type 2 diabetes mellitus with hyperglycemia, without long-term  current use of insulin (HCC)    Dyspnea on exertion        Past Surgical History:      Procedure Laterality Date    CHOLECYSTECTOMY      COLONOSCOPY      ERCP N/A 09/28/2022    ERCP STENT REMOVAL/EXCHANGE performed by Georgeann Oppenheim, MD at [facility] ENDOSCOPY    ERCP N/A 09/28/2022    ERCP STONE REMOVAL performed by Georgeann Oppenheim, MD at [place], TOTAL ABDOMINAL (CERVIX REMOVED)      SPINE SURGERY      Plate placed in back    UPPER GASTROINTESTINAL ENDOSCOPY         Family History:  Family History   Problem Relation  Age of Onset    Breast Cancer Sister 72    Cancer Sister 25        throat    Cancer Maternal Grandmother 73        unknown type       Medications:  Reviewed and reconciled.    Social History:  Social History     Socioeconomic History    Marital status: Unknown     Spouse name: Not on file    Number of children: Not on file    Years of education: Not on file    Highest education level: Not on file   Occupational History    Not on file   Tobacco Use    Smoking status: Never    Smokeless tobacco: Never   Vaping Use    Vaping Use: Never used   Substance and Sexual Activity    Alcohol use: Yes     Comment: occassionaly    Drug use: Never    Sexual activity: Not on file   Other Topics Concern    Not on file   Social History Narrative    Not on file     Social Determinants of Health     Financial Resource Strain: Not on file   Food Insecurity: No Food Insecurity (10/15/2022)    Hunger Vital Sign     Worried About Running Out of Food in the Last Year: Never true     Ran Out of Food in the Last Year: Never true   Transportation Needs: No Transportation Needs (10/15/2022)    PRAPARE - Armed forces logistics/support/administrative officer (Medical): No     Lack of Transportation (Non-Medical): No   Physical Activity: Not on file   Stress: Not on file   Social Connections: Not on file   Intimate Partner Violence: Not on file   Housing Stability: Low Risk  (10/15/2022)    Housing Stability Vital Sign     Unable to Pay for Housing in the Last Year: No     Number of Places Lived in the Last Year: 1     Unstable Housing in the Last Year: No       Allergies:  Allergies   Allergen Reactions    Molds & Smuts     Nicotine     Shellfish Allergy      Throat seemed to close       Physical Exam:  There were no vitals taken for this visit.    GENERAL: Alert, oriented x 3, not in acute distress  HEENT: PERRLA; EOMI. Oropharynx clear.   NECK: Supple. No palpable cervical or supraclavicular lymphadenopathy.   LUNGS: Good air entry bilaterally. No wheezing,  crackles or  rhonchi.   CARDIOVASCULAR: Regular rate. No murmurs, rubs or gallops.   ABDOMEN: Soft. Non-tender, non-distended. Positive bowel sounds.  EXTREMITIES: Without clubbing, cyanosis, or edema.   NEUROLOGIC: No focal deficits.   ECOG PS 1    Impression/Plan:     Gabriella Blake is a pleasant 82 year old lady, with a past medical history significant for uterine cancer, status post TAH, type II DM, hyperlipidemia, and hypertension, who was diagnosed with pancreatic cancer in June 2023, the patient received her care initially at [facility] health, CT scan of the abdomen and the pelvis from 03/22/2022 had revealed an ill-defined 3.5 x 2.5 cm hypodense mass and the uncinate process of the pancreas, hypodense focus in the inferior right lobe of the liver, she had an MRI done on 03/23/2022, revealing a hypoenhancing mass in the uncinate process/inferior head of the pancreas abutting the SMV with distortion of the SMV, less than 180 degree involvement by MR, pancreatic duct narrowing/obstruction, single hepatic lesion in the right hepatic lobe, small lymph node adjacent to the masslike area in the head of the pancreas, she had on 03/26/2022 and a US done, she was found to have a mass in the pancreatic head and uncinate process, FNA was consistent with adenocarcinoma.  The patient was started on systemic palliative therapy with gemcitabine and Abraxane on 04/27/2022, the patient had 3 staging CTs done on 07/13/2022, revealing decreased size of the pancreatic head mass, several new hypodense lesions in the liver, according to the patient, after review of her scans it was not felt that she had disease progression, gemcitabine and Abraxane were continued.  The patient had relocated to [facility] to be with her family, she had lost about 75 pounds, she has nausea, no abdominal pain at this time.  The patient had bili obstruction and an ERCP done on 05/07/2022, with placement of covered metal stent, was found to have ulcerative tumor  invading the duodenum,  The patient had the biliary stent exchange on 09/28/2022 by Dr. Luz Brazen.      Records from [facility] were reviewed, discussed with the patient her diagnosis, and prognosis, recommended restating CT scans of the chest, abdomen, and pelvis, were done on 09/23/2022, which I had reviewed, she has a small 2 to 3 mm noncalcified nodules in the right upper and right mid lung, nonspecific, ill-defined fullness of the pancreatic head around 2 cm, hypoenhancing focus concerning for underlying malignancy, surrounding adenopathy without metastatic disease otherwise noted.  On 10/01/2022, chemotherapy was resumed, The patient was admitted to the hospital with COVID, she had pancytopenia, requiring blood product transfusion, hospitalization records reviewed, today 11/12/2022 chemotherapy will be resumed.    The patient is doing well clinically, today 11/26/2022, labs reviewed, blood counts adequate for treatment, bilirubin had normalized, proceed with gemcitabine and Abraxane, CA 19-9 is trending down it had decreased to 1608, will continue to monitor.  The patient to be relocating to [place], her daughter-in-law is in the medical field, she will let us know who is the medical oncologist she would like to see in [place].  3/1 labs , ed visit 12/05/2022 fall hypoglcemia , patient reports  appointment with [facility] oncology on 2/26    RTC in 2 weeks.    Thank you for allowing [place] to participate in the care of Gabriella Blake.    Dierdre Highman, APRN,ACNS-BC,AGACNP-BC  HEMATOLOGY/MEDICAL [place]  [facility]  [address]  [place] [address]  Dept: [phone]  Loc:  [phone]

## 2022-12-10 DEATH — deceased

## 2022-12-14 ENCOUNTER — Inpatient Hospital Stay: Admit: 2022-12-14

## 2022-12-17 NOTE — Telephone Encounter (Signed)
Lynn Blackwell, with [facility] called the scheduling line this morning stating that this pt has sadly passed away. She will be faxing over a request shortly for Dr.Sohal to sign the death certificate. Her call back number is   [phone]  Thanks,  [facility]

## 2022-12-20 ENCOUNTER — Encounter: Payer: MEDICARE | Attending: Family

## 2022-12-21 NOTE — Telephone Encounter (Signed)
Faxed signed death certificate to [phone] and received successful confirmation.

## 2022-12-23 ENCOUNTER — Telehealth: Payer: Self-pay | Admitting: Nurse Practitioner

## 2022-12-23 NOTE — Telephone Encounter (Signed)
Called patient to schedule Medicare Annual Wellness Visit (AWV). No voicemail available to leave a message.  Last date of AWV:  AWVI eligible as of 07/11/2013  Please schedule an AWVIappointment at any time with Annual Wellness Visit.  If any questions, please contact me at [phone].    Thank you,  [place] Direct dial  [phone]

## 2023-01-06 ENCOUNTER — Telehealth: Payer: Self-pay | Admitting: Nurse Practitioner

## 2023-01-06 NOTE — Telephone Encounter (Signed)
Called patient to schedule Medicare Annual Wellness Visit (AWV). No voicemail available to leave a message.  Last date of AWV: AWVI eligible as of 07/11/2013  Please schedule an AWVI appointment at any time with [place].  If any questions, please contact me at [phone].   Thank you,  [place] Direct dial  [phone]

## 2023-06-30 ENCOUNTER — Encounter: Payer: Self-pay | Admitting: Oncology

## 2023-06-30 NOTE — Telephone Encounter (Signed)
Open by mistake

## 2023-09-01 ENCOUNTER — Encounter: Payer: Self-pay | Admitting: Oncology

## 2023-09-01 NOTE — Telephone Encounter (Signed)
Telephone call  

## 2023-11-28 ENCOUNTER — Other Ambulatory Visit (HOSPITAL_BASED_OUTPATIENT_CLINIC_OR_DEPARTMENT_OTHER): Payer: Self-pay
